# Patient Record
Sex: Male | Born: 1935 | Race: White | Hispanic: No | Marital: Married | Smoking: Former smoker
Health system: Southern US, Community
[De-identification: ages and names within clinical notes are randomized; demographics above are authoritative.]

## PROBLEM LIST (undated history)

## (undated) DIAGNOSIS — Z923 Personal history of irradiation: Secondary | ICD-10-CM

## (undated) DIAGNOSIS — R918 Other nonspecific abnormal finding of lung field: Secondary | ICD-10-CM

## (undated) DIAGNOSIS — Q5310 Unspecified undescended testicle, unilateral: Secondary | ICD-10-CM

## (undated) DIAGNOSIS — R001 Bradycardia, unspecified: Secondary | ICD-10-CM

## (undated) DIAGNOSIS — E7800 Pure hypercholesterolemia, unspecified: Secondary | ICD-10-CM

## (undated) DIAGNOSIS — M1990 Unspecified osteoarthritis, unspecified site: Secondary | ICD-10-CM

## (undated) DIAGNOSIS — H269 Unspecified cataract: Secondary | ICD-10-CM

## (undated) DIAGNOSIS — N183 Chronic kidney disease, stage 3 unspecified: Secondary | ICD-10-CM

## (undated) DIAGNOSIS — I4819 Other persistent atrial fibrillation: Secondary | ICD-10-CM

## (undated) DIAGNOSIS — Z8619 Personal history of other infectious and parasitic diseases: Secondary | ICD-10-CM

## (undated) DIAGNOSIS — K5790 Diverticulosis of intestine, part unspecified, without perforation or abscess without bleeding: Secondary | ICD-10-CM

## (undated) DIAGNOSIS — Z86010 Personal history of colonic polyps: Secondary | ICD-10-CM

## (undated) DIAGNOSIS — I2510 Atherosclerotic heart disease of native coronary artery without angina pectoris: Secondary | ICD-10-CM

## (undated) DIAGNOSIS — H409 Unspecified glaucoma: Secondary | ICD-10-CM

## (undated) DIAGNOSIS — M86131 Other acute osteomyelitis, right radius and ulna: Secondary | ICD-10-CM

## (undated) DIAGNOSIS — N400 Enlarged prostate without lower urinary tract symptoms: Secondary | ICD-10-CM

## (undated) DIAGNOSIS — I6529 Occlusion and stenosis of unspecified carotid artery: Secondary | ICD-10-CM

## (undated) DIAGNOSIS — C61 Malignant neoplasm of prostate: Secondary | ICD-10-CM

## (undated) DIAGNOSIS — K219 Gastro-esophageal reflux disease without esophagitis: Secondary | ICD-10-CM

## (undated) DIAGNOSIS — K4090 Unilateral inguinal hernia, without obstruction or gangrene, not specified as recurrent: Secondary | ICD-10-CM

## (undated) DIAGNOSIS — K449 Diaphragmatic hernia without obstruction or gangrene: Secondary | ICD-10-CM

## (undated) DIAGNOSIS — E785 Hyperlipidemia, unspecified: Secondary | ICD-10-CM

## (undated) DIAGNOSIS — I10 Essential (primary) hypertension: Secondary | ICD-10-CM

## (undated) HISTORY — DX: Benign prostatic hyperplasia without lower urinary tract symptoms: N40.0

## (undated) HISTORY — DX: Chronic kidney disease, stage 3 (moderate): N18.3

## (undated) HISTORY — DX: Gastro-esophageal reflux disease without esophagitis: K21.9

## (undated) HISTORY — DX: Personal history of other infectious and parasitic diseases: Z86.19

## (undated) HISTORY — DX: Essential (primary) hypertension: I10

## (undated) HISTORY — DX: Diverticulosis of intestine, part unspecified, without perforation or abscess without bleeding: K57.90

## (undated) HISTORY — DX: Chronic kidney disease, stage 3 unspecified: N18.30

## (undated) HISTORY — DX: Unspecified osteoarthritis, unspecified site: M19.90

## (undated) HISTORY — DX: Occlusion and stenosis of unspecified carotid artery: I65.29

## (undated) HISTORY — DX: Other persistent atrial fibrillation: I48.19

## (undated) HISTORY — DX: Unspecified undescended testicle, unilateral: Q53.10

## (undated) HISTORY — DX: Hyperlipidemia, unspecified: E78.5

## (undated) HISTORY — DX: Pure hypercholesterolemia, unspecified: E78.00

## (undated) HISTORY — DX: Unilateral inguinal hernia, without obstruction or gangrene, not specified as recurrent: K40.90

## (undated) HISTORY — DX: Atherosclerotic heart disease of native coronary artery without angina pectoris: I25.10

## (undated) HISTORY — DX: Diaphragmatic hernia without obstruction or gangrene: K44.9

## (undated) HISTORY — PX: TUMOR REMOVAL: SHX12

## (undated) HISTORY — DX: Bradycardia, unspecified: R00.1

## (undated) HISTORY — DX: Other nonspecific abnormal finding of lung field: R91.8

## (undated) HISTORY — DX: Personal history of irradiation: Z92.3

## (undated) HISTORY — DX: Malignant neoplasm of prostate: C61

## (undated) HISTORY — DX: Other acute osteomyelitis, right radius and ulna: M86.131

## (undated) HISTORY — DX: Personal history of colonic polyps: Z86.010

---

## 1989-08-24 HISTORY — PX: CORONARY ARTERY BYPASS GRAFT: SHX141

## 1998-07-06 ENCOUNTER — Emergency Department (HOSPITAL_COMMUNITY): Admission: EM | Admit: 1998-07-06 | Discharge: 1998-07-06 | Payer: Self-pay | Admitting: Emergency Medicine

## 2003-02-27 ENCOUNTER — Encounter: Admission: RE | Admit: 2003-02-27 | Discharge: 2003-02-27 | Payer: Self-pay | Admitting: Internal Medicine

## 2003-02-27 ENCOUNTER — Encounter: Payer: Self-pay | Admitting: Internal Medicine

## 2003-04-13 ENCOUNTER — Inpatient Hospital Stay (HOSPITAL_COMMUNITY): Admission: AD | Admit: 2003-04-13 | Discharge: 2003-04-20 | Payer: Self-pay | Admitting: Interventional Cardiology

## 2003-04-20 ENCOUNTER — Encounter: Payer: Self-pay | Admitting: Interventional Cardiology

## 2003-04-25 DIAGNOSIS — Z95810 Presence of automatic (implantable) cardiac defibrillator: Secondary | ICD-10-CM

## 2003-04-25 HISTORY — DX: Presence of automatic (implantable) cardiac defibrillator: Z95.810

## 2003-05-14 ENCOUNTER — Ambulatory Visit (HOSPITAL_COMMUNITY): Admission: RE | Admit: 2003-05-14 | Discharge: 2003-05-15 | Payer: Self-pay | Admitting: Cardiology

## 2003-05-14 HISTORY — PX: PACEMAKER INSERTION: SHX728

## 2003-05-15 ENCOUNTER — Encounter: Payer: Self-pay | Admitting: Cardiology

## 2004-01-18 ENCOUNTER — Encounter: Admission: RE | Admit: 2004-01-18 | Discharge: 2004-01-18 | Payer: Self-pay | Admitting: Family Medicine

## 2005-06-11 ENCOUNTER — Emergency Department (HOSPITAL_COMMUNITY): Admission: EM | Admit: 2005-06-11 | Discharge: 2005-06-12 | Payer: Self-pay | Admitting: Emergency Medicine

## 2007-02-17 ENCOUNTER — Encounter: Admission: RE | Admit: 2007-02-17 | Discharge: 2007-02-17 | Payer: Self-pay | Admitting: Orthopedic Surgery

## 2007-12-21 HISTORY — PX: COLONOSCOPY: SHX174

## 2009-06-04 ENCOUNTER — Ambulatory Visit (HOSPITAL_COMMUNITY): Admission: RE | Admit: 2009-06-04 | Discharge: 2009-06-04 | Payer: Self-pay | Admitting: Interventional Cardiology

## 2009-06-04 HISTORY — PX: CARDIAC CATHETERIZATION: SHX172

## 2009-08-24 HISTORY — PX: CATARACT EXTRACTION W/ INTRAOCULAR LENS IMPLANT: SHX1309

## 2010-04-11 ENCOUNTER — Ambulatory Visit (HOSPITAL_COMMUNITY): Admission: RE | Admit: 2010-04-11 | Discharge: 2010-04-11 | Payer: Self-pay | Admitting: Interventional Cardiology

## 2010-09-14 ENCOUNTER — Encounter: Payer: Self-pay | Admitting: Orthopedic Surgery

## 2010-11-27 LAB — PROTIME-INR
INR: 1.5 — ABNORMAL HIGH (ref 0.00–1.49)
Prothrombin Time: 18 seconds — ABNORMAL HIGH (ref 11.6–15.2)

## 2011-01-09 NOTE — H&P (Signed)
NAME:  Jonathan, Warner                  ACCOUNT NO.:  [id number]   MEDICAL RECORD NO.:  [id number]          PATIENT TYPE:  OBV   LOCATION:  1831                         FACILITY:  [facility]   PHYSICIAN:  [place] OF BIRTH:  1931/09/28   DATE OF ADMISSION:  06/11/2005  DATE OF DISCHARGE:                                HISTORY & PHYSICAL   The patient is a 77 year old man with a history of MI and CABG who had three  episodes of dizziness with nausea. He also had chest tightness and  diaphoresis associated with these episodes. They all lasted just a few  minutes and were relieved spontaneously. One episode occurred while sitting,  two others while standing. Currently, the patient does not have any chest  discomfort. He does not have any shortness of breath. Since his bypass in  1991, he has not required any revascularization. He has undergone a cardiac  catheterization in 2001 after which medical management was continued. He  also had a normal stress test approximately one week ago. Overall, he feels  well.   PAST MEDICAL/SURGICAL HISTORY:  1.  CABG in 1991.  2.  Coronary artery disease, status post pacemaker.  3.  Atrial fibrillation.  4.  Arthritis.   CURRENT MEDICATIONS:  1.  Coumadin 2.5 mg p.o. daily.  2.  Terazosin 5 mg p.o. daily.  3.  Norvasc 10 mg p.o. daily.  4.  Lisinopril 10 mg p.o. daily.  5.  KCl 20 mEq p.o. daily.  6.  Sotalol 80 mg b.i.d.  7.  Crestor 5 mg b.i.d.   ALLERGIES:  No known drug allergies.   SOCIAL HISTORY:  No tobacco, no alcohol, no herbal medicines.   FAMILY HISTORY:  There is a history of coronary artery disease.   REVIEW OF SYSTEMS:  She does report joint pains, nausea, chest discomfort,  dizziness, and diaphoresis as described above. All other systems negative.   PHYSICAL EXAMINATION:  VITAL SIGNS:  Pulse of 62, blood pressure of 155/77.  The patient is afebrile.  GENERAL:  He is awake, alert, and in no apparent distress.  NECK:  No bruits and no jugular venous distention.  CARDIOVASCULAR:  Regular rate and rhythm. S1 and S2.  LUNGS:  Clear to auscultation bilaterally.  ABDOMEN:  Soft, nontender, and nondistended.  EXTREMITIES:  There was 1+ pretibial edema.  NEUROLOGICAL:  No focal deficits.   LABORATORY DATA:  EKG shows normal sinus rhythm, occasional PVCs,  nonspecific ST and T-wave changes.   Hemoglobin 13.6, hematocrit 39.6. BUN 19, creatinine 1.1. Potassium of 4.3.  First set of cardiac enzymes MB of 1.4, troponin less than 0.05.   ASSESSMENT:  This 77 year old with coronary artery disease and chest pain.   PLAN:  1.  Cardiac:  Rule out MI with enzymes. The patient also on Coumadin with an      INR of 3.3 as of yesterday. We will not use any heparin at this time.      Continue aspirin 81 mg p.o. daily.  2.  It is unclear how best to evaluate this patient next  since he has had      such a recent stress test; however, it will also be difficult to perform      cardiac catheterization on him given his INR. Further plans per Dr.      [name].           ______________________________  Jettie Booze, MD     JSV/MEDQ  D:  06/12/2005  T:  06/12/2005  Job:  [id number]

## 2011-01-09 NOTE — Discharge Summary (Signed)
NAME:  Jonathan Warner, Jonathan Warner                            ACCOUNT NO.:  [id number]   MEDICAL RECORD NO.:  [id number]                   PATIENT TYPE:  INP   LOCATION:  3702                                 FACILITY:  [facility]   PHYSICIAN:  Belva Crome, M.D.                DATE OF BIRTH:  Feb 20, 1932   DATE OF ADMISSION:  04/13/2003  DATE OF DISCHARGE:  04/20/2003                                 DISCHARGE SUMMARY   ADMISSION DIAGNOSES:  1. Atrial fibrillation.  2. Elective admission for drug loading with Betapace.  3. Coumadin anticoagulation.  4. Coronary artery disease, status post coronary artery bypass graft 2000.  5. Hypokalemia.  6. Hypertension.   DISCHARGE DIAGNOSES:  1. Atrial fibrillation, resolved with cardioversion.  2. Successful drug loading with Betapace.  3. Chronic anticoagulation secondary to #1.  4. Coronary artery disease, status post coronary artery bypass graft in     2000, stable.  5. Hypokalemia, resolved with replacement.  6. Hypertension, controlled.   PROCEDURE:  Elective cardioversion April 19, 2003.   COMPLICATIONS:  None.   DISCHARGE STATUS:  Stable.   HISTORY OF PRESENT ILLNESS:  For completeness, see H&P.  However, in short,  this a 77 year old male who was admitted on April 13, 2003, electively for  drug loading with Betapace secondary to atrial fibrillation.  His duration  of atrial fibrillation has been unknown; however, was seen at least a month  ago by primary care and found to have new-onset atrial fibrillation.  A 2D  echocardiogram March 12, 2003, showed normal LV function and moderate-to-  severe left atrial enlargement.  He was having tachyarrhythmias despite  being on rate-controlling medications, including metoprolol and Cardizem.  He had complaints of exercise intolerance and, hence, was admitted for  medication adjustment.   PHYSICAL EXAMINATION:  On admission, see admission H&P.   HOSPITAL COURSE:  The patient discontinued his Cardizem  and metoprolol 24  hours prior to his admission on April 13, 2003.  He was started on Betapace  120 mg b.i.d.  He continued his Coumadin therapy.  He required a significant  amount of potassium replacement secondary to hypokalemia.  His potassium at  admission was 2.5.  Thus, Betapace initiation was placed on hold secondary  to normalization of potassium.  He was without any complaints.  Vital signs  remained stable with the exception of some mild hypertension.   On April 14, 2003, he was still moderately hypokalemic at 3.0.  Replacement  was continued.  His Norvasc was increased from 7.5 to 10 mg secondary to  hypertension.  On April 15, 2003, he was still hypokalemic with his  potassium still at 3.2.  Oral replacement continued.  Betapace therapy still  not begun.  His potassium finally normalized by April 16, 2003, at 3.8.  Hence, he was started on sotalol at 120 mg b.i.d.  Serum and urine  aldosterone  levels as well as serum renin levels and magnesium were all  evaluated at this point secondary to continued prolonged hypokalemia.   By April 17, 2003, blood pressure had improved on higher dose of Norvasc.  ACE inhibitor was added for additional control as well as to help with  potassium requirements.  So far, he is tolerating the Betapace without  significant bradycardia.  QTC was within normal limits.   On April 19, 2003, the patient underwent electrical cardioversion  successfully after one 300 joule delivery which converted him to a sinus  bradycardia.  Tolerated procedure well.  Vital signs remained stable.  Renal  consultation was also requested on this day secondary to increased renin and  aldosterone levels.  Magnesium level was normal.  Renal consultation was  obtained April 19, 2003.  Their recommendations included a MRA to evaluate  for renal artery stenosis.  Urine osmolality was also ordered at their  request.   On April 20, 2003, the patient had no complaints.   Vital signs were stable  with normal blood pressure.  Potassium was 4.2.  He maintained a sinus  bradycardia in the 50s without dizziness or near syncope.  He underwent MRA  of the renal arteries.  That report is not present at the time of this  dictation.  He was discharged home without further incident.   DISCHARGE MEDICATIONS:  1. Coumadin 2.5 mg daily.  2. Prilosec 20 mg daily.  3. Hytrin 5 mg daily.  4. Norvasc 10 mg daily.  5. Lisinopril 10 mg daily.  6. Potassium 20 mEq daily.  7. Betapace 80 mg b.i.d.  8. He was instructed not to take his hydrochlorothiazide, metoprolol or     Cardizem.   ACTIVITY:  As tolerated.   PLAN:  1. He is to maintain a low-salt diet.  No caffeine.  2. He is to return to the office on Friday, April 27, 2003, for INR and     potassium check.  3. He has an appointment to follow up with Dr. [name] on Friday, May 04, 2003, at 9:15 a.m.      Verlon Au, N.P.                        Belva Crome, M.D.    HB/MEDQ  D:  04/20/2003  T:  04/21/2003  Job:  [id number]   cc:   Harriette Bouillon, M.D.  [place] [place]  [place] [address]  Fax: [phone]

## 2011-01-09 NOTE — H&P (Signed)
NAME:  Jonathan Warner, Jonathan Warner                            ACCOUNT NO.:  [id number]   MEDICAL RECORD NO.:  [id number]                   PATIENT TYPE:  INP   LOCATION:  3702                                 FACILITY:  [facility]   PHYSICIAN:  Belva Crome, M.D.                DATE OF BIRTH:  Jun 23, 1932   DATE OF ADMISSION:  04/13/2003  DATE OF DISCHARGE:                                HISTORY & PHYSICAL   ADMISSION DIAGNOSES:  1. Atrial fibrillation.  2. Elective admission for drug loading with Betapace.  3. Coumadin anticoagulation.  4. Known coronary artery disease status post coronary artery bypass grafting     in 2000.  5. Hypokalemia.  6. Hypertension.  7. Benign prostatic hypertrophy.  8. Gastroesophageal reflux disease.   CHIEF COMPLAINT:  Atrial fibrillation of unknown duration, symptomatic.   HISTORY OF PRESENT ILLNESS:  Jonathan Warner is a pleasant 77 year old white  gentleman who was initially evaluated by Dr. [name] as a referral from Dr.  Leonie Douglas on 03/09/2003.  This referral was for further workup and evaluation  of new onset atrial fibrillation, duration unknown.  The patient had been  started on Coumadin by Dr. Leonie Douglas in July upon initial diagnosis.  An  echocardiogram of 03/12/2003, showed moderate to severe left atrial  enlargement, preserved LV systolic function, and dilated left ventricular  cavity at 6.0 cm and diastole with EF of 65% and asymmetrical septal  hypertrophy.  He also wore a Holter monitor which revealed tachyarrhythmias  with rates up to 160 despite being on metoprolol and Cardizem.   The patient had been complaining of decreased energy and exertional  tolerance.  He denies syncope, presyncope, or bleeding trouble on Coumadin.  He denies orthopnea, PND and chest pain.   Dr. [name] has recommended elective admission for Betapace loading.  The  patient is to discontinue Cardizem and Toprol 24 hours prior to admission.  Will start Betapace 120 mg b.i.d. and  cardiovert the patient after 48 hours  of therapy if he does not convert.   ALLERGIES:  HYDROCODONE caused hives and nausea.   MEDICATIONS:  1. Norvasc 7.5 mg a day.  2. Metoprolol 50 mg a day.  3. Cardizem 240 mg a day.  4. Hydrochlorothiazide 25 mg.  5. Terazosin 5 mg a day.  6. Coumadin as directed.  7. Multivitamins.  8. Vitamin E.  9. Prilosec.    PAST MEDICAL HISTORY:  1. Coronary artery disease status post CABG in 2000 with LIMA to LAD, SVG to     OM-1, and SVT to the intermediate and then a sequential SVG to acute     marginal and distal RCA.  2. Hypertension.  3. BPH.  4. History of kidney stones.  5. GERD.   FAMILY HISTORY:  Mother died late in life with cardiovascular disease (in  her 56s).  Two brothers died of myocardial infarction, and sisters  have  hypertension.   SOCIAL HISTORY:  The patient is married.  He is retired.  He has one son.  He does not smoke, use tobacco products, alcohol, or illicit drugs.   REVIEW OF SYSTEMS:  See HPI, otherwise unremarkable.   PHYSICAL EXAMINATION:  GENERAL:  Alert and oriented x 3 in no acute  distress.  Telemetry reveals atrial fibrillation with controlled ventricular  response.  HEENT:  Normocephalic and atraumatic.  PERRLA.  EOMI.  NECK:  Supple without bruits or masses.  No JVD.  LUNGS:  Clear to auscultation with symmetric excursion.  COR:  Irregular rate and rhythm with a 1/6 systolic murmur.  No gallops or  rubs.  ABDOMEN:  Soft, nontender, nondistended.  Positive bowel sounds.  No bruits.  EXTREMITIES:  Without edema.  Femoral and distal pulses are intact.  NEUROLOGIC:  Nonfocal.  Mentation intact.   IMPRESSION:  1. Symptomatic atrial fibrillation of unknown duration.  2. Coumadin anticoagulation.  3. Hypertension.  4. Coronary artery disease.  5. Gastroesophageal reflux disease.  6. Benign prostatic hypertrophy.   PLAN:  As noted above, the patient will be admitted to [facility]  for elective  drug loading with Betapace. Will discontinue Cardizem and  metoprolol 24 hours prior to admission.  Will start Betapace 120 mg b.i.d.  and then cardiovert after 48 hours of therapy if patient does not convert  pharmacologically.   The patient is agreeable to this approach.  He does have a low potassium on  pre-admission laboratory studies, and this will be rechecked prior to  starting the Betapace therapy.      Dellia Nims Jernejcic, P.A.                   Belva Crome, M.D.    TCJ/MEDQ  D:  04/13/2003  T:  04/14/2003  Job:  [id number]   cc:   Harriette Bouillon, M.D.  [place]. [address] [place]  [place] [address]  Fax: [phone]

## 2011-01-09 NOTE — Consult Note (Signed)
NAME:  Jonathan Warner, Jonathan Warner                            ACCOUNT NO.:  [id number]   MEDICAL RECORD NO.:  [id number]                   PATIENT TYPE:  INP   LOCATION:  3702                                 FACILITY:  [facility]   PHYSICIAN:  James L. Deterding, M.D.            DATE OF BIRTH:  1932/03/24   DATE OF CONSULTATION:  04/19/2003  DATE OF DISCHARGE:                                   CONSULTATION   REFERRING PHYSICIAN:  Belva Crome, M.D.   CONSULTING PHYSICIAN:  Joyice Faster. Deterding, M.D.   REASON FOR CONSULTATION:  Hypokalemia and hypertension.   HISTORY OF PRESENT ILLNESS:  This is a 77 year old gentleman with a history  of hypertension for about 20 years, history of MI x2, first one in 1984,  next one in 1991, history of CABG in 1991, history of kidney stones in the  past three to four months, history of shingles in the last few months,  history of GERD, and history of BPH.  He was found to have atrial  fibrillation in July 2004, of unknown cause.  He is admitted now for  Betapace load and had a potassium on admission of 2.7.  He has received  about 200 mEq of potassium to replete his potassium, but his chloride has  decreased with potassium.   HOME MEDICATIONS:  1. Norvasc.  2. Metoprolol.  3. Cardizem.  4. Hydrochlorothiazide.  5. Hytrin.  6. Coumadin.  7. Vitamin E.  8. Prilosec.   He also had shingles in June.   FAMILY HISTORY:  He has a positive family history with his mother and six  siblings who have hypertension.  He has one sister he knows has had a stent  to her kidney for high blood pressure, and also he has multiple siblings on  oral potassium.   He has had no history of frequent loose stools.  He has a history of dyspnea  on exertion, but no orthopnea or edema.  He has no muscle aches, pain, no  syncope or paralysis, and no family history of that.  He had several  siblings on the potassium as mentioned above.  He has no history of  diabetes, no family history  of diabetes.  He has no history of urinary tract  infections.  He has a history of non-steroidal use in the past three or four  months.  He has a  history of osteomyelitis a few years ago in his right  arm.  This was related to an injury that occurred as a child.  His blood  pressure has been increased in the last four months, and that correlates  with fatigue, as well as correlates with use of hydrochlorothiazide.  Hydrochlorothiazide was added, however, because his blood pressure was  elevated.   PAST MEDICAL HISTORY:  1. The above medications.  2. He has had a CABG.  3. He had a fracture of  his arm and operative procedure, and subsequent     osteomyelitis in the arm.   ALLERGIES:  HYDROCODONE, which caused rash and hives.   ILLNESSES:  Listed above.   SOCIAL HISTORY:  He is retired, married, he has one son who is healthy.   FAMILY HISTORY:  He has nine siblings, six at least have high blood  pressure.  He thinks the others do too.  Two brothers died of myocardial  infarction, and both his sisters have high blood pressure.  Mother died of  cardiovascular disease in her 25s.  Father died with what he thinks is  cardiovascular disease in his 42s.   REVIEW OF SYSTEMS:  HEENT:  No visual problems, no headaches, hearing  difficulties.  CARDIOVASCULAR:  As listed above.  PULMONARY:  No cough,  sputum production, he has no history of asthma, and has a smoking history of  over 20 pack year history, quit in 1984 with his first MI.  GASTROINTESTINAL:  He has indigestion and heartburn quite frequently, and  takes Prilosec.  He has loose stools, anywhere from one to three a day.  MUSCULOSKELETAL:  Only arthritis in the arm where he had the injury.  He did  take the non-steroidal's for that.  NEUROLOGIC:  No complaints.  EXTREMITIES:  No complaints other then his right arm where he had the injury  and the osteo.   PHYSICAL EXAMINATION:  GENERAL:  In no acute distress.  He is oriented x3.   VITAL SIGNS:  Blood pressure is 122/52 going to 124/54 from laying to  standing, and no difference in the left arm.  The first two pressures were  on the right.  HEENT:  Retina, disks, and vessels to be normal.  Ears unremarkable.  Pharynx unremarkable.  CARDIOVASCULAR:  Regular rhythm, S4, grade 2/6 holosystolic murmur heard  best at the apex.  PMI is 12 cm  from the fifth intercostal space.  He had  bilateral femoral bruits.  He has a right carotid bruit.  He has decreased  breath sounds, pedal pulses, and 1 to 2+ edema.  LUNGS:  No rales, rhonchi, or wheezes.  ABDOMEN:  Positive bowel sounds.  Liver is at costal margin.  No  organomegaly.  The abdomen is soft.  He cannot hear bruits about the  abdomen.  SKIN:  Shows some dryness.  He has some seborrheic keratosis and a rash, a  large scar on his right forearm where he had a slight deformity of the right  forearm where he had the osteo and injury there.  MUSCULOSKELETAL:  Otherwise shows some hypertrophic changes of DIP's and  PIP's, otherwise, unremarkable.  NEUROLOGIC:  Cranial nerves II-XII grossly intact.  Motor is 5/5 and  symmetric.  Deep tendon reflexes are 2+/4+.  Toes downgoing.   LABORATORY DATA:  Reveals hemoglobin to be 11.7, white count 5400, platelets  of 140,000.  Sodium 135, potassium 4.2, chloride 102, bicarbonate 26,  creatinine 1.3, BUN 28, glucose 76.  Aldo of 32.7 ng/dl.  __________  activity 8.7 nanograms/ml/hour.  Urine potassium 50 mEq/L.  Urine sodium 56  mEq/L.  Creatinine clearance 97 cc a minute.   ASSESSMENT:  1. Hypokalemia in the setting of hypertension and vascular disease.  Replete     without a large amount of difficulty at this time.  Decreased potassium     in the setting of hydrochlorothiazide and diarrhea is somewhat     suspicious, however.  The increased renin which has increased even  greater then increasing aldo is somewhat borderline and difficult to    interpret in the setting of this  situation, i.e., cardiac disease and     hydrochlorothiazide.  He was not on a hydrochlorothiazide and potassium     repletion, would expect a lower potassium in urine, but cannot say that     with those two factors compounding that.  Even with these confusing     factors, I feel that his hyperaldosteronism and hyperreninism is at a     high risk for renal artery stenosis.  This situation with increased renin     and aldo and alkalosis is less likely from Bartter/Gitelman syndrome.   Would look as his renal size, look at the architecture of the kidneys on an  ultrasound, and then check an MRA to look at his renal arteries.   Repeat urine potassium would also be useful, as well as urine sediment.  A  history of proteinuria is worrisome.  1. Hypertension.  Decrease question if the lisinopril is having an increased     effect because of the possibility of a high renin Jonathan Warner.  2. Atrial fibrillation.  Per Dr. [name].  3. Peripheral vascular disease, on exam.  4. Reflux esophagitis.   PLAN:  1. MRA.  2. Ultrasound.  3. Urinalysis.  4. Urine potassium.  5. Urine osmolality and serum osmolality.                                               James L. Deterding, M.D.    JLD/MEDQ  D:  04/19/2003  T:  04/20/2003  Job:  [id number]

## 2011-01-09 NOTE — Op Note (Signed)
NAME:  Jonathan Warner, Jonathan Warner                            ACCOUNT NO.:  [id number]   MEDICAL RECORD NO.:  [id number]                   PATIENT TYPE:  OIB   LOCATION:  6522                                 FACILITY:  [facility]   PHYSICIAN:  Barnett Abu, M.D.               DATE OF BIRTH:  01/21/1932   DATE OF PROCEDURE:  05/14/2003  DATE OF DISCHARGE:                                 OPERATIVE REPORT   PROCEDURES PERFORMED:  1. Insertion of dual-chamber permanent transvenous pacemaker.  2. Left subclavian venogram.   INDICATIONS:  Mr. Jonathan Warner is a 77 year old male with paroxysmal atrial  fibrillation.  This has been well-controlled with sotalol 80 mg p.o. b.i.d.  However, he has had a problem with excessive fatigue and chronic  bradycardia, heart rate in the 40-45 beat per minute range.  He is referred  now for insertion of a dual-chamber permanent transvenous pacemaker with  activity sensory.   PROCEDURAL NOTE:  The patient was brought to the cardiac catheterization  laboratory in the fasting state.  The left prepectoral region was prepped  and draped in the usual sterile fashion.  Local anesthesia was attained with  the infiltration of 1% lidocaine.  A left subclavian venogram was performed  with the peripheral injection of 20 mL of Omnipaque.  A digital  cineangiogram was obtained and road-mapped to guide future left subclavian  puncture.  The venogram did show the vein to be widely patent and coursing  in a normal fashion over the anterior surface of the first rib and beneath  the middle third of the clavicle.  There was no evidence for persistence of  the left superior vena cava.  A 6-7 cm incision was then made in the  deltopectoral groove, and this was carried down by sharp dissection and  electrocautery to the prepectoral fascia.  There a plane was lifted and a  pocket formed inferiorly and medially using blunt dissection and  electrocautery.  The pocket was then packed with a 1%  kanamycin-soaked  gauze.  Two separate left subclavian punctures were then performed using an  18-gauge thin-wall needle, through which was passed a 0.038 inch tight J  guidewire.  Over the initial guidewire a 7 French tear-away sheath and  dilator were advanced.  The wire and dilator were removed.  The ventricular  lead was advanced to the level of the right atrium and the sheath was torn  away.  Using standard technique and fluoroscopic landmarks, the lead was  manipulated into the right ventricular apex.  There excellent pacing  parameters were obtained, as will be noted below.  The lead was tested for  diaphragmatic pacing at 10 volts, and none was found.  The lead was then  sutured into place using three separate 0 silk ligatures.  Over the  remaining guidewire a 9 French tear-away sheath and dilator were advanced.  The dilator  was removed.  The wire was allowed to remain in place.  The  atrial lead was advanced to the level of the right atrium.  The sheath was  torn away.  Again using standard technique and fluoroscopic landmarks, the  lead was manipulated into the right atrial appendage remnant.  There  excellent pacing parameters were obtained.  This was an active-fixation lead  and the screw was advanced as appropriate.  The lead was tested again for  adequate pacing parameters and these were excellent, as noted below.  The  lead was also tested for diaphragmatic pacing at 10 volts, and none was  found.  The lead was then sutured in place using three separate 0 silk  ligatures.  The remaining guidewire and the kanamycin-soaked gauze was  removed from the pocket.  The pocket was copiously irrigated using 1%  kanamycin solution.  The pacing leads were then connected to the pacing  generator, carefully identifying each by its serial number and placing each  into the appropriate receptacle.  Each lead was carefully tightened into  place and tested for security.  The leads were then  wound beneath the pacing  generator and the generator was placed in the pocket.  The pocket was  inspected for bleeding and none was found.  The wound was then closed using  2-0 Dexon in a running fashion for the subcutaneous layer.  The skin was  approximated using 5-0 Dexon in a running subcuticular fashion.  Steri-  Strips and a sterile dressing were applied and the patient was transported  to the recovery area in an A-pace, V-sense mode.   EQUIPMENT DATA:  The pacing generator is a Medtronic Enpulse model number  [id number], serial number [id number].  The atrial lead is a Medtronic model  number [id number], serial number [id number].  The ventricular lead is a Medtronic  model [id number], serial number [id number].   PACING DATA:  The atrial lead detected a 5.8 millivolt P-wave.  The pacing  threshold was 1 volt at 0.5 msec pulse width.  The impedance was 477 Ohms.  This resulted in a current at capture threshold of 2.7 MA.  The ventricular  lead detected an 11.3 millivolt R-wave.  The pacing threshold was 0.4 volts  at 0.5 msec pulse width.  The impedance was 581 Ohms, resulting in a current  at capture threshold of 1 MA.                                               Barnett Abu, M.D.    JHE/MEDQ  D:  05/14/2003  T:  05/15/2003  Job:  [id number]   cc:   Sinclair Grooms, M.D.  [place] [employer]  [address]  [place] [address]  Fax: [phone]   Cardiac Catheterization Laboratory

## 2011-01-09 NOTE — Op Note (Signed)
   NAME:  Jonathan Warner, Jonathan Warner                            ACCOUNT NO.:  [id number]   MEDICAL RECORD NO.:  [id number]                   PATIENT TYPE:  INP   LOCATION:  3702                                 FACILITY:  [facility]   PHYSICIAN:  Sinclair Grooms, M.D.            DATE OF BIRTH:  1931-09-26   DATE OF PROCEDURE:  04/19/2003  DATE OF DISCHARGE:                                 OPERATIVE REPORT   PROCEDURE PERFORMED:  Elective cardioversion.   INDICATIONS FOR PROCEDURE:  Atrial fibrillation.   DESCRIPTION OF PROCEDURE:  The patient was given 100 mg of sodium pentothal  by Sharolyn Douglas, M.D.  The patient had been placed with anterior and  posterior paddle configuration.  In synchronized mode, a single 300 joule  discharge was administered with reversion to normal sinus rhythm/significant  sinus bradycardia.  The patient awakened from the procedure without  complication.   CONCLUSION:  Successful electrical cardioversion from atrial fibrillation to  sinus bradycardia.   PLAN:  Watch heart rate closely.  May ultimately require pacemaker with  antiarrhythmic therapies to be continued.  Will use on Betapace and try to  remove all other medications that could potentially slow heart rate.                                               Sinclair Grooms, M.D.    HWS/MEDQ  D:  04/19/2003  T:  04/19/2003  Job:  [id number]   cc:   Harriette Bouillon, M.D.  [address]  [place] [address]  Fax: [phone]

## 2011-03-12 ENCOUNTER — Other Ambulatory Visit: Payer: Self-pay | Admitting: Internal Medicine

## 2011-03-12 DIAGNOSIS — M545 Low back pain, unspecified: Secondary | ICD-10-CM

## 2011-03-13 ENCOUNTER — Ambulatory Visit
Admission: RE | Admit: 2011-03-13 | Discharge: 2011-03-13 | Disposition: A | Discharge status: Home / Self Care | Payer: Medicare Other | Source: Ambulatory Visit | Attending: Internal Medicine | Admitting: Internal Medicine

## 2011-03-13 DIAGNOSIS — M545 Low back pain: Secondary | ICD-10-CM

## 2011-05-06 ENCOUNTER — Institutional Professional Consult (permissible substitution): Payer: Medicare Other | Admitting: Internal Medicine

## 2011-08-26 DIAGNOSIS — I4891 Unspecified atrial fibrillation: Secondary | ICD-10-CM | POA: Diagnosis not present

## 2011-08-26 DIAGNOSIS — Z7901 Long term (current) use of anticoagulants: Secondary | ICD-10-CM | POA: Diagnosis not present

## 2011-08-26 DIAGNOSIS — Z950 Presence of cardiac pacemaker: Secondary | ICD-10-CM | POA: Diagnosis not present

## 2011-09-30 DIAGNOSIS — I4891 Unspecified atrial fibrillation: Secondary | ICD-10-CM | POA: Diagnosis not present

## 2011-09-30 DIAGNOSIS — Z7901 Long term (current) use of anticoagulants: Secondary | ICD-10-CM | POA: Diagnosis not present

## 2011-10-13 DIAGNOSIS — H40009 Preglaucoma, unspecified, unspecified eye: Secondary | ICD-10-CM | POA: Diagnosis not present

## 2011-10-21 DIAGNOSIS — Z950 Presence of cardiac pacemaker: Secondary | ICD-10-CM | POA: Diagnosis not present

## 2011-10-21 DIAGNOSIS — Z7901 Long term (current) use of anticoagulants: Secondary | ICD-10-CM | POA: Diagnosis not present

## 2011-10-21 DIAGNOSIS — I10 Essential (primary) hypertension: Secondary | ICD-10-CM | POA: Diagnosis not present

## 2011-10-21 DIAGNOSIS — I2510 Atherosclerotic heart disease of native coronary artery without angina pectoris: Secondary | ICD-10-CM | POA: Diagnosis not present

## 2011-10-21 DIAGNOSIS — I4891 Unspecified atrial fibrillation: Secondary | ICD-10-CM | POA: Diagnosis not present

## 2011-11-19 DIAGNOSIS — Z7901 Long term (current) use of anticoagulants: Secondary | ICD-10-CM | POA: Diagnosis not present

## 2011-11-19 DIAGNOSIS — I4891 Unspecified atrial fibrillation: Secondary | ICD-10-CM | POA: Diagnosis not present

## 2011-11-30 DIAGNOSIS — Z950 Presence of cardiac pacemaker: Secondary | ICD-10-CM | POA: Diagnosis not present

## 2011-11-30 DIAGNOSIS — I495 Sick sinus syndrome: Secondary | ICD-10-CM | POA: Diagnosis not present

## 2011-12-31 DIAGNOSIS — Z7901 Long term (current) use of anticoagulants: Secondary | ICD-10-CM | POA: Diagnosis not present

## 2011-12-31 DIAGNOSIS — I4891 Unspecified atrial fibrillation: Secondary | ICD-10-CM | POA: Diagnosis not present

## 2012-01-07 DIAGNOSIS — M79609 Pain in unspecified limb: Secondary | ICD-10-CM | POA: Diagnosis not present

## 2012-01-07 DIAGNOSIS — I252 Old myocardial infarction: Secondary | ICD-10-CM | POA: Diagnosis not present

## 2012-01-07 DIAGNOSIS — N183 Chronic kidney disease, stage 3 unspecified: Secondary | ICD-10-CM | POA: Diagnosis not present

## 2012-01-07 DIAGNOSIS — Z0000 Encounter for general adult medical examination without abnormal findings: Secondary | ICD-10-CM | POA: Diagnosis not present

## 2012-01-07 DIAGNOSIS — Z1331 Encounter for screening for depression: Secondary | ICD-10-CM | POA: Diagnosis not present

## 2012-02-04 DIAGNOSIS — R972 Elevated prostate specific antigen [PSA]: Secondary | ICD-10-CM | POA: Diagnosis not present

## 2012-02-11 DIAGNOSIS — K4090 Unilateral inguinal hernia, without obstruction or gangrene, not specified as recurrent: Secondary | ICD-10-CM | POA: Diagnosis not present

## 2012-02-11 DIAGNOSIS — R972 Elevated prostate specific antigen [PSA]: Secondary | ICD-10-CM | POA: Diagnosis not present

## 2012-02-11 DIAGNOSIS — Z7901 Long term (current) use of anticoagulants: Secondary | ICD-10-CM | POA: Diagnosis not present

## 2012-02-11 DIAGNOSIS — N402 Nodular prostate without lower urinary tract symptoms: Secondary | ICD-10-CM | POA: Diagnosis not present

## 2012-02-11 DIAGNOSIS — Q539 Undescended testicle, unspecified: Secondary | ICD-10-CM | POA: Diagnosis not present

## 2012-02-11 DIAGNOSIS — I4891 Unspecified atrial fibrillation: Secondary | ICD-10-CM | POA: Diagnosis not present

## 2012-03-14 DIAGNOSIS — Q150 Congenital glaucoma: Secondary | ICD-10-CM | POA: Diagnosis not present

## 2012-03-14 DIAGNOSIS — Z961 Presence of intraocular lens: Secondary | ICD-10-CM | POA: Diagnosis not present

## 2012-03-24 DIAGNOSIS — Z7901 Long term (current) use of anticoagulants: Secondary | ICD-10-CM | POA: Diagnosis not present

## 2012-03-24 DIAGNOSIS — Z950 Presence of cardiac pacemaker: Secondary | ICD-10-CM | POA: Diagnosis not present

## 2012-03-24 DIAGNOSIS — I4891 Unspecified atrial fibrillation: Secondary | ICD-10-CM | POA: Diagnosis not present

## 2012-04-27 DIAGNOSIS — R972 Elevated prostate specific antigen [PSA]: Secondary | ICD-10-CM | POA: Diagnosis not present

## 2012-04-27 DIAGNOSIS — C61 Malignant neoplasm of prostate: Secondary | ICD-10-CM

## 2012-04-27 HISTORY — DX: Malignant neoplasm of prostate: C61

## 2012-04-27 HISTORY — PX: PROSTATE BIOPSY: SHX241

## 2012-05-02 ENCOUNTER — Other Ambulatory Visit: Payer: Self-pay | Admitting: Urology

## 2012-05-02 DIAGNOSIS — C61 Malignant neoplasm of prostate: Secondary | ICD-10-CM

## 2012-05-11 DIAGNOSIS — C61 Malignant neoplasm of prostate: Secondary | ICD-10-CM | POA: Diagnosis not present

## 2012-05-12 ENCOUNTER — Encounter (HOSPITAL_COMMUNITY)
Admission: RE | Admit: 2012-05-12 | Discharge: 2012-05-12 | Disposition: A | Discharge status: Home / Self Care | Payer: Medicare Other | Source: Ambulatory Visit | Attending: Urology | Admitting: Urology

## 2012-05-12 ENCOUNTER — Ambulatory Visit (HOSPITAL_COMMUNITY)
Admission: RE | Admit: 2012-05-12 | Discharge: 2012-05-12 | Disposition: A | Discharge status: Home / Self Care | Payer: Medicare Other | Source: Ambulatory Visit | Attending: Urology | Admitting: Urology

## 2012-05-12 DIAGNOSIS — C61 Malignant neoplasm of prostate: Secondary | ICD-10-CM | POA: Diagnosis not present

## 2012-05-12 MED ORDER — TECHNETIUM TC 99M MEDRONATE IV KIT
25.0000 | PACK | Freq: Once | INTRAVENOUS | Status: AC | PRN
  Administered 2012-05-12: 25 via INTRAVENOUS

## 2012-05-13 ENCOUNTER — Encounter: Payer: Self-pay | Admitting: Internal Medicine

## 2012-05-13 DIAGNOSIS — I4891 Unspecified atrial fibrillation: Secondary | ICD-10-CM | POA: Diagnosis not present

## 2012-05-13 DIAGNOSIS — Z950 Presence of cardiac pacemaker: Secondary | ICD-10-CM | POA: Diagnosis not present

## 2012-05-13 DIAGNOSIS — I495 Sick sinus syndrome: Secondary | ICD-10-CM | POA: Diagnosis not present

## 2012-05-13 DIAGNOSIS — Z7901 Long term (current) use of anticoagulants: Secondary | ICD-10-CM | POA: Diagnosis not present

## 2012-05-18 DIAGNOSIS — C61 Malignant neoplasm of prostate: Secondary | ICD-10-CM | POA: Diagnosis not present

## 2012-05-19 ENCOUNTER — Encounter: Payer: Self-pay | Admitting: Internal Medicine

## 2012-05-19 DIAGNOSIS — R0602 Shortness of breath: Secondary | ICD-10-CM | POA: Diagnosis not present

## 2012-05-19 DIAGNOSIS — I2510 Atherosclerotic heart disease of native coronary artery without angina pectoris: Secondary | ICD-10-CM | POA: Diagnosis not present

## 2012-05-19 DIAGNOSIS — Z950 Presence of cardiac pacemaker: Secondary | ICD-10-CM | POA: Diagnosis not present

## 2012-05-19 DIAGNOSIS — I4891 Unspecified atrial fibrillation: Secondary | ICD-10-CM | POA: Diagnosis not present

## 2012-05-20 ENCOUNTER — Ambulatory Visit (INDEPENDENT_AMBULATORY_CARE_PROVIDER_SITE_OTHER): Payer: Medicare Other | Admitting: Internal Medicine

## 2012-05-20 ENCOUNTER — Other Ambulatory Visit: Payer: Self-pay | Admitting: *Deleted

## 2012-05-20 ENCOUNTER — Encounter: Payer: Self-pay | Admitting: *Deleted

## 2012-05-20 ENCOUNTER — Encounter: Payer: Self-pay | Admitting: Internal Medicine

## 2012-05-20 VITALS — BP 114/62 | HR 65 | Ht 70.0 in | Wt 186.0 lb

## 2012-05-20 DIAGNOSIS — I495 Sick sinus syndrome: Secondary | ICD-10-CM | POA: Diagnosis not present

## 2012-05-20 DIAGNOSIS — Z01812 Encounter for preprocedural laboratory examination: Secondary | ICD-10-CM | POA: Diagnosis not present

## 2012-05-20 DIAGNOSIS — I4891 Unspecified atrial fibrillation: Secondary | ICD-10-CM

## 2012-05-20 DIAGNOSIS — I25810 Atherosclerosis of coronary artery bypass graft(s) without angina pectoris: Secondary | ICD-10-CM

## 2012-05-20 DIAGNOSIS — R001 Bradycardia, unspecified: Secondary | ICD-10-CM

## 2012-05-20 DIAGNOSIS — I498 Other specified cardiac arrhythmias: Secondary | ICD-10-CM | POA: Diagnosis not present

## 2012-05-20 LAB — CBC WITH DIFFERENTIAL/PLATELET
Basophils Absolute: 0 10*3/uL (ref 0.0–0.1)
Basophils Relative: 0 % (ref 0–1)
Eosinophils Absolute: 0.1 10*3/uL (ref 0.0–0.7)
Eosinophils Relative: 2 % (ref 0–5)
HCT: 38.3 % — ABNORMAL LOW (ref 39.0–52.0)
Hemoglobin: 13.2 g/dL (ref 13.0–17.0)
Lymphocytes Relative: 26 % (ref 12–46)
Lymphs Abs: 1.3 10*3/uL (ref 0.7–4.0)
MCH: 33.8 pg (ref 26.0–34.0)
MCHC: 34.5 g/dL (ref 30.0–36.0)
MCV: 98.2 fL (ref 78.0–100.0)
Monocytes Absolute: 0.5 10*3/uL (ref 0.1–1.0)
Monocytes Relative: 10 % (ref 3–12)
Neutro Abs: 3 10*3/uL (ref 1.7–7.7)
Neutrophils Relative %: 62 % (ref 43–77)
Platelets: 160 10*3/uL (ref 150–400)
RBC: 3.9 MIL/uL — ABNORMAL LOW (ref 4.22–5.81)
RDW: 13 % (ref 11.5–15.5)
WBC: 4.8 10*3/uL (ref 4.0–10.5)

## 2012-05-21 LAB — BASIC METABOLIC PANEL
BUN: 20 mg/dL (ref 6–23)
CO2: 32 mEq/L (ref 19–32)
Calcium: 8.9 mg/dL (ref 8.4–10.5)
Chloride: 101 mEq/L (ref 96–112)
Creat: 1.3 mg/dL (ref 0.50–1.35)
Glucose, Bld: 76 mg/dL (ref 70–99)
Potassium: 3.6 mEq/L (ref 3.5–5.3)
Sodium: 140 mEq/L (ref 135–145)

## 2012-05-21 LAB — PROTIME-INR
INR: 2.51 — ABNORMAL HIGH (ref <1.50)
Prothrombin Time: 26.1 seconds — ABNORMAL HIGH (ref 11.6–15.2)

## 2012-05-23 ENCOUNTER — Encounter: Payer: Self-pay | Admitting: Internal Medicine

## 2012-05-23 ENCOUNTER — Encounter (HOSPITAL_COMMUNITY): Payer: Self-pay | Admitting: Pharmacy Technician

## 2012-05-23 DIAGNOSIS — R001 Bradycardia, unspecified: Secondary | ICD-10-CM | POA: Insufficient documentation

## 2012-05-23 DIAGNOSIS — I4891 Unspecified atrial fibrillation: Secondary | ICD-10-CM | POA: Insufficient documentation

## 2012-05-23 DIAGNOSIS — I25810 Atherosclerosis of coronary artery bypass graft(s) without angina pectoris: Secondary | ICD-10-CM | POA: Insufficient documentation

## 2012-05-23 MED ORDER — CEFAZOLIN SODIUM-DEXTROSE 2-3 GM-% IV SOLR
2.0000 g | INTRAVENOUS | Status: AC
  Filled 2012-05-23: qty 50

## 2012-05-23 MED ORDER — SODIUM CHLORIDE 0.9 % IR SOLN
80.0000 mg | Status: AC
  Filled 2012-05-23: qty 2

## 2012-05-23 NOTE — Assessment & Plan Note (Signed)
No ischemic symptoms No changes today 

## 2012-05-23 NOTE — Assessment & Plan Note (Signed)
The patient is s/p PPM for symptomatic bradycardia. He has reached ERI battery status and is symptomatic with this. Risks, benefits, and alternatives to PPM pulse generator replacement were discussed at length with the patient who wishes to proceed.  We will therefore plan pacemaker pulse generator change at the next available time.

## 2012-05-23 NOTE — Assessment & Plan Note (Signed)
Controlled with sotalol. Continue coumadin for stroke prevention No changes today

## 2012-05-23 NOTE — Progress Notes (Signed)
Primary Cardiologist:  Dr [name]  Jonathan Warner is a 78 y.o. male with a h/o bradycardia sp PPM (MDT) by Dr Leonia Reeves who presents today to establish care in the Electrophysiology device clinic.  The patient presented in 2004 with atrial fibrillation requiring cardioversion.  He was able to maintain sinus rhythm with sotalol but unfortunately developed significant fatigue with HR 40s.  He therefore had a PPM implanted. The patient reports doing very well since having a pacemaker implanted and remains very active despite his age.  He recently converted to ERI battery status.  He reports abrupt onset of fatigue and decreased exercise tolerance with ERI.  Today, he  denies symptoms of palpitations, chest pain, shortness of breath, orthopnea, PND, lower extremity edema, dizziness, presyncope, syncope, or neurologic sequela.  The patientis tolerating medications without difficulties and is otherwise without complaint today.   Past Medical History  Diagnosis Date  . Symptomatic bradycardia     s/p PPM by Dr Leonia Reeves 2004  . Persistent atrial fibrillation   . CAD (coronary artery disease)     s/p CABG 1991  . GERD (gastroesophageal reflux disease)   . Hyperlipidemia   . BPH (benign prostatic hyperplasia)   . DJD (degenerative joint disease)   . Diverticulosis   . Hypertension    Past Surgical History  Procedure Date  . Pacemaker insertion 05/14/03    MDT EnPulse implanted by Dr Leonia Reeves  . Coronary artery bypass graft     x5    History   Social History  . Marital Status: Married    Spouse Name: N/A    Number of Children: N/A  . Years of Education: N/A   Occupational History  . Not on file.   Social History Main Topics  . Smoking status: Former Research scientist (life sciences)  . Smokeless tobacco: Not on file  . Alcohol Use: No  . Drug Use: No  . Sexually Active: Not on file   Other Topics Concern  . 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   No Known  Allergies  Current Outpatient Prescriptions  Medication Sig Dispense Refill  . amLODipine (NORVASC) 5 MG tablet Take 5 mg by mouth daily.      . furosemide (LASIX) 40 MG tablet Take 60 mg by mouth daily.      [name] latanoprost (XALATAN) 0.005 % ophthalmic solution Place 1 drop into both eyes at bedtime.       . Multiple Vitamin (MULTIVITAMIN) tablet Take 1 tablet by mouth daily.      . nitroGLYCERIN (NITROSTAT) 0.4 MG SL tablet Place 0.4 mg under the tongue every 5 (five) minutes as needed. For chest pain.      [name] omeprazole (PRILOSEC) 20 MG capsule Take 20 mg by mouth daily.      . potassium chloride SA (K-DUR,KLOR-CON) 20 MEQ tablet Take 30 mEq by mouth daily.       . sotalol (BETAPACE) 160 MG tablet Take 160 mg by mouth 2 (two) times daily.       [name] terazosin (HYTRIN) 5 MG capsule Take 5 mg by mouth at bedtime.      . vitamin E 400 UNIT capsule Take 400 Units by mouth daily.      [name] warfarin (COUMADIN) 5 MG tablet Take 1.25-2.5 mg by mouth daily. Take 2.5mg  daily, except take 1.25mg  on Mondays, Wednesdays, and Fridays.       No current facility-administered medications for this visit.  Facility-Administered Medications Ordered in Other Visits  Medication Dose Route Frequency Provider Last Rate Last Dose  . ceFAZolin (ANCEF) IVPB 2 g/50 mL premix  2 g Intravenous On Call Thompson Grayer, MD      . gentamicin (GARAMYCIN) 80 mg in sodium chloride irrigation 0.9 % 500 mL irrigation  80 mg Irrigation On Call Thompson Grayer, MD        ROS- all systems are reviewed and negative except as per HPI  Physical Exam: Filed Vitals:   05/20/12 1636  BP: 114/62  Pulse: 65  Height: 5\' 10"  (1.778 m)  Weight: 186 lb (84.369 kg)  SpO2: 100%    GEN- The patient is well appearing, alert and oriented x 3 today.   Head- normocephalic, atraumatic Eyes-  Sclera clear, conjunctiva pink Ears- hearing intact Oropharynx- clear Neck- supple, no JVP Lymph- no cervical lymphadenopathy Lungs- Clear to ausculation  bilaterally, normal work of breathing Chest- pacemaker pocket is well healed Heart- Regular rate and rhythm, no murmurs, rubs or gallops, PMI not laterally displaced GI- soft, NT, ND, + BS Extremities- no clubbing, cyanosis, or edema MS- no significant deformity or atrophy Skin- no rash or lesion Psych- euthymic mood, full affect Neuro- strength and sensation are intact  Pacemaker interrogation from [facility] 05/19/12- reviewed in detail today and reveals ERI battery status  Assessment and Plan:

## 2012-05-24 ENCOUNTER — Ambulatory Visit (HOSPITAL_COMMUNITY): Payer: Medicare Other

## 2012-05-24 ENCOUNTER — Ambulatory Visit (HOSPITAL_COMMUNITY)
Admission: RE | Admit: 2012-05-24 | Discharge: 2012-05-24 | Disposition: A | Discharge status: Home / Self Care | Payer: Medicare Other | Source: Ambulatory Visit | Attending: Internal Medicine | Admitting: Internal Medicine

## 2012-05-24 ENCOUNTER — Encounter (HOSPITAL_COMMUNITY)
Admission: RE | Disposition: A | Discharge status: Home / Self Care | Payer: Self-pay | Source: Ambulatory Visit | Attending: Internal Medicine

## 2012-05-24 DIAGNOSIS — I495 Sick sinus syndrome: Secondary | ICD-10-CM | POA: Diagnosis not present

## 2012-05-24 DIAGNOSIS — I2510 Atherosclerotic heart disease of native coronary artery without angina pectoris: Secondary | ICD-10-CM | POA: Insufficient documentation

## 2012-05-24 DIAGNOSIS — I441 Atrioventricular block, second degree: Secondary | ICD-10-CM | POA: Diagnosis not present

## 2012-05-24 DIAGNOSIS — I499 Cardiac arrhythmia, unspecified: Secondary | ICD-10-CM | POA: Diagnosis not present

## 2012-05-24 DIAGNOSIS — I4891 Unspecified atrial fibrillation: Secondary | ICD-10-CM | POA: Diagnosis not present

## 2012-05-24 DIAGNOSIS — Z45018 Encounter for adjustment and management of other part of cardiac pacemaker: Secondary | ICD-10-CM | POA: Insufficient documentation

## 2012-05-24 DIAGNOSIS — R001 Bradycardia, unspecified: Secondary | ICD-10-CM | POA: Diagnosis present

## 2012-05-24 DIAGNOSIS — I10 Essential (primary) hypertension: Secondary | ICD-10-CM | POA: Insufficient documentation

## 2012-05-24 HISTORY — PX: PACEMAKER GENERATOR CHANGE: SHX5998

## 2012-05-24 HISTORY — PX: PERMANENT PACEMAKER GENERATOR CHANGE: SHX6022

## 2012-05-24 LAB — SURGICAL PCR SCREEN
MRSA, PCR: NEGATIVE
Staphylococcus aureus: NEGATIVE

## 2012-05-24 LAB — PROTIME-INR
INR: 2.03 — ABNORMAL HIGH (ref 0.00–1.49)
Prothrombin Time: 22.1 seconds — ABNORMAL HIGH (ref 11.6–15.2)

## 2012-05-24 SURGERY — PERMANENT PACEMAKER GENERATOR CHANGE
Anesthesia: LOCAL

## 2012-05-24 MED ORDER — SODIUM CHLORIDE 0.9 % IV SOLN: 250.0000 mL | INTRAVENOUS | Status: DC

## 2012-05-24 MED ORDER — SODIUM CHLORIDE 0.9 % IV SOLN: 250.0000 mL | INTRAVENOUS | Status: DC | PRN

## 2012-05-24 MED ORDER — SODIUM CHLORIDE 0.9 % IJ SOLN: 3.0000 mL | Freq: Two times a day (BID) | INTRAMUSCULAR | Status: DC

## 2012-05-24 MED ORDER — SODIUM CHLORIDE 0.9 % IJ SOLN: 3.0000 mL | INTRAMUSCULAR | Status: DC | PRN

## 2012-05-24 MED ORDER — ACETAMINOPHEN 325 MG PO TABS: 325.0000 mg | ORAL_TABLET | ORAL | Status: DC | PRN

## 2012-05-24 MED ORDER — MUPIROCIN 2 % EX OINT
TOPICAL_OINTMENT | Freq: Two times a day (BID) | CUTANEOUS | Status: DC
  Filled 2012-05-24: qty 22

## 2012-05-24 MED ORDER — LIDOCAINE HCL (PF) 1 % IJ SOLN
INTRAMUSCULAR | Status: AC
  Filled 2012-05-24: qty 60

## 2012-05-24 MED ORDER — CHLORHEXIDINE GLUCONATE 4 % EX LIQD
60.0000 mL | Freq: Once | CUTANEOUS | Status: DC
  Filled 2012-05-24: qty 60

## 2012-05-24 MED ORDER — MUPIROCIN 2 % EX OINT
TOPICAL_OINTMENT | CUTANEOUS | Status: AC
  Filled 2012-05-24: qty 22

## 2012-05-24 MED ORDER — SODIUM CHLORIDE 0.45 % IV SOLN: INTRAVENOUS | Status: DC

## 2012-05-24 MED ORDER — ONDANSETRON HCL 4 MG/2ML IJ SOLN: 4.0000 mg | Freq: Four times a day (QID) | INTRAMUSCULAR | Status: DC | PRN

## 2012-05-24 MED ORDER — HYDROCODONE-ACETAMINOPHEN 5-325 MG PO TABS: 1.0000 | ORAL_TABLET | ORAL | Status: DC | PRN

## 2012-05-24 MED ORDER — CEFAZOLIN SODIUM-DEXTROSE 2-3 GM-% IV SOLR
INTRAVENOUS | Status: AC
  Filled 2012-05-24: qty 50

## 2012-05-24 NOTE — Op Note (Signed)
SURGEON:  Thompson Grayer, MD     PREPROCEDURE DIAGNOSES:   1. Sick sinus syndrome.   2. Paroxsymal Atrial fibrillation.   3. Mobitz II second degree AV block    POSTPROCEDURE DIAGNOSES:   1. Sick sinus syndrome.   2. Paroxsymal Atrial fibrillation.   3. Mobitz II second degree AV block    PROCEDURES:   1. Pacemaker pulse generator replacement.   2. Skin pocket revision.     INTRODUCTION:  Jonathan Warner is a 78 y.o. male with a history of sick sinus syndrome and atrial fibrillation.  He has mobitz II second degree AV block upon interrogation today.  He has done well since his pacemaker was implanted.  He has recently reached ERI battery status.  He presents today for pacemaker pulse generator replacement.       DESCRIPTION OF THE PROCEDURE:  Informed written consent was obtained, and the patient was brought to the electrophysiology lab in the fasting state.  The patient's pacemaker was interrogated today and found to be at elective replacement indicator battery status.  The patient required no sedation for the procedure today.  The patient's left chest was prepped and draped in the usual sterile fashion by the EP lab staff.  The skin overlying the existing pacemaker was infiltrated with lidocaine for local analgesia.  A 4-cm incision was made over the pacemaker pocket.  Using a combination of sharp and blunt dissection, the pacemaker was exposed and removed from the body.  The device was disconnected from the leads.  There was no foreign matter or debris within the pocket.  The atrial lead was confirmed to be a Medtronic model [id number] (serial number [id number]) lead implanted on 05/14/2003.  The right ventricular lead was confirmed to be a Medtronic model [id number] (serial number [id number]) lead implanted on the same date as the atrial lead (above).  Both leads were examined and their integrity was confirmed to be intact.  Atrial lead P-waves measured 5.4 mV with impedance of 539 ohms and a threshold of 1.0  V at 0.5 msec.  Right ventricular lead R-waves measured 14.2 mV with impedance of 545 ohms and a threshold of 0.7 V at 0.5 msec.  Both leads were connected to a Medtronic adapt L model ADDDR 1 (serial number [id number]) pacemaker.  The pocket was revised to accommodate this new device.  Electrocautery was required to assure hemostasis.  The pocket was irrigated with copious gentamicin solution. The pacemaker was then placed into the pocket.  The pocket was then closed in 2 layers with 2-0 Vicryl suture over the subcutaneous and subcuticular layers.  Steri-Strips and a sterile dressing were then applied.  There were no early apparent complications.     CONCLUSIONS:   1. Successful pacemaker pulse generator replacement for elective replacement indicator battery status   2. No early apparent complications.     Thompson Grayer, MD 05/24/2012 3:03 PM

## 2012-05-24 NOTE — Interval H&P Note (Signed)
History and Physical Interval Note:  05/24/2012 2:00 PM  Jonathan Warner  has presented today for surgery, with the diagnosis of End of life  The various methods of treatment have been discussed with the patient and family. After consideration of risks, benefits and other options for treatment, the patient has consented to  Procedure(s) (LRB) with comments: PERMANENT PACEMAKER GENERATOR CHANGE (N/A) as a surgical intervention .  The patient's history has been reviewed, patient examined, no change in status, stable for surgery.  I have reviewed the patient's chart and labs.  Questions were answered to the patient's satisfaction.     Thompson Grayer

## 2012-05-24 NOTE — H&P (View-Only) (Signed)
Primary Cardiologist:  Dr [name]  Jonathan Warner is a 78 y.o. male with a h/o bradycardia sp PPM (MDT) by Dr Leonia Reeves who presents today to establish care in the Electrophysiology device clinic.  The patient presented in 2004 with atrial fibrillation requiring cardioversion.  He was able to maintain sinus rhythm with sotalol but unfortunately developed significant fatigue with HR 40s.  He therefore had a PPM implanted. The patient reports doing very well since having a pacemaker implanted and remains very active despite his age.  He recently converted to ERI battery status.  He reports abrupt onset of fatigue and decreased exercise tolerance with ERI.  Today, he  denies symptoms of palpitations, chest pain, shortness of breath, orthopnea, PND, lower extremity edema, dizziness, presyncope, syncope, or neurologic sequela.  The patientis tolerating medications without difficulties and is otherwise without complaint today.   Past Medical History  Diagnosis Date  . Symptomatic bradycardia     s/p PPM by Dr Leonia Reeves 2004  . Persistent atrial fibrillation   . CAD (coronary artery disease)     s/p CABG 1991  . GERD (gastroesophageal reflux disease)   . Hyperlipidemia   . BPH (benign prostatic hyperplasia)   . DJD (degenerative joint disease)   . Diverticulosis   . Hypertension    Past Surgical History  Procedure Date  . Pacemaker insertion 05/14/03    MDT EnPulse implanted by Dr Leonia Reeves  . Coronary artery bypass graft     x5    History   Social History  . Marital Status: Married    Spouse Name: N/A    Number of Children: N/A  . Years of Education: N/A   Occupational History  . Not on file.   Social History Main Topics  . Smoking status: Former Research scientist (life sciences)  . Smokeless tobacco: Not on file  . Alcohol Use: No  . Drug Use: No  . Sexually Active: Not on file   Other Topics Concern  . 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   No Known  Allergies  Current Outpatient Prescriptions  Medication Sig Dispense Refill  . amLODipine (NORVASC) 5 MG tablet Take 5 mg by mouth daily.      . furosemide (LASIX) 40 MG tablet Take 60 mg by mouth daily.      [name] latanoprost (XALATAN) 0.005 % ophthalmic solution Place 1 drop into both eyes at bedtime.       . Multiple Vitamin (MULTIVITAMIN) tablet Take 1 tablet by mouth daily.      . nitroGLYCERIN (NITROSTAT) 0.4 MG SL tablet Place 0.4 mg under the tongue every 5 (five) minutes as needed. For chest pain.      [name] omeprazole (PRILOSEC) 20 MG capsule Take 20 mg by mouth daily.      . potassium chloride SA (K-DUR,KLOR-CON) 20 MEQ tablet Take 30 mEq by mouth daily.       . sotalol (BETAPACE) 160 MG tablet Take 160 mg by mouth 2 (two) times daily.       [name] terazosin (HYTRIN) 5 MG capsule Take 5 mg by mouth at bedtime.      . vitamin E 400 UNIT capsule Take 400 Units by mouth daily.      [name] warfarin (COUMADIN) 5 MG tablet Take 1.25-2.5 mg by mouth daily. Take 2.5mg  daily, except take 1.25mg  on Mondays, Wednesdays, and Fridays.       No current facility-administered medications for this visit.  Facility-Administered Medications Ordered in Other Visits  Medication Dose Route Frequency Provider Last Rate Last Dose  . ceFAZolin (ANCEF) IVPB 2 g/50 mL premix  2 g Intravenous On Call Thompson Grayer, MD      . gentamicin (GARAMYCIN) 80 mg in sodium chloride irrigation 0.9 % 500 mL irrigation  80 mg Irrigation On Call Thompson Grayer, MD        ROS- all systems are reviewed and negative except as per HPI  Physical Exam: Filed Vitals:   05/20/12 1636  BP: 114/62  Pulse: 65  Height: 5\' 10"  (1.778 m)  Weight: 186 lb (84.369 kg)  SpO2: 100%    GEN- The patient is well appearing, alert and oriented x 3 today.   Head- normocephalic, atraumatic Eyes-  Sclera clear, conjunctiva pink Ears- hearing intact Oropharynx- clear Neck- supple, no JVP Lymph- no cervical lymphadenopathy Lungs- Clear to ausculation  bilaterally, normal work of breathing Chest- pacemaker pocket is well healed Heart- Regular rate and rhythm, no murmurs, rubs or gallops, PMI not laterally displaced GI- soft, NT, ND, + BS Extremities- no clubbing, cyanosis, or edema MS- no significant deformity or atrophy Skin- no rash or lesion Psych- euthymic mood, full affect Neuro- strength and sensation are intact  Pacemaker interrogation from [facility] 05/19/12- reviewed in detail today and reveals ERI battery status  Assessment and Plan:

## 2012-05-26 ENCOUNTER — Encounter: Payer: Medicare Other | Admitting: Internal Medicine

## 2012-05-26 ENCOUNTER — Ambulatory Visit: Payer: Medicare Other

## 2012-05-26 ENCOUNTER — Ambulatory Visit: Payer: Medicare Other | Admitting: Radiation Oncology

## 2012-05-30 ENCOUNTER — Encounter: Payer: Self-pay | Admitting: Radiation Oncology

## 2012-05-30 ENCOUNTER — Encounter: Payer: Self-pay | Admitting: *Deleted

## 2012-05-31 ENCOUNTER — Encounter: Payer: Self-pay | Admitting: Radiation Oncology

## 2012-05-31 ENCOUNTER — Ambulatory Visit
Admission: RE | Admit: 2012-05-31 | Discharge: 2012-05-31 | Disposition: A | Discharge status: Home / Self Care | Payer: Medicare Other | Source: Ambulatory Visit | Attending: Radiation Oncology | Admitting: Radiation Oncology

## 2012-05-31 VITALS — BP 117/57 | HR 68 | Temp 97.5°F | Resp 20 | Wt 190.2 lb

## 2012-05-31 DIAGNOSIS — I4891 Unspecified atrial fibrillation: Secondary | ICD-10-CM | POA: Insufficient documentation

## 2012-05-31 DIAGNOSIS — Z510 Encounter for antineoplastic radiation therapy: Secondary | ICD-10-CM | POA: Insufficient documentation

## 2012-05-31 DIAGNOSIS — N189 Chronic kidney disease, unspecified: Secondary | ICD-10-CM | POA: Insufficient documentation

## 2012-05-31 DIAGNOSIS — Z79899 Other long term (current) drug therapy: Secondary | ICD-10-CM | POA: Insufficient documentation

## 2012-05-31 DIAGNOSIS — Z7901 Long term (current) use of anticoagulants: Secondary | ICD-10-CM | POA: Diagnosis not present

## 2012-05-31 DIAGNOSIS — I129 Hypertensive chronic kidney disease with stage 1 through stage 4 chronic kidney disease, or unspecified chronic kidney disease: Secondary | ICD-10-CM | POA: Insufficient documentation

## 2012-05-31 DIAGNOSIS — Z800 Family history of malignant neoplasm of digestive organs: Secondary | ICD-10-CM | POA: Diagnosis not present

## 2012-05-31 DIAGNOSIS — C61 Malignant neoplasm of prostate: Secondary | ICD-10-CM | POA: Insufficient documentation

## 2012-05-31 DIAGNOSIS — E785 Hyperlipidemia, unspecified: Secondary | ICD-10-CM | POA: Insufficient documentation

## 2012-05-31 DIAGNOSIS — K219 Gastro-esophageal reflux disease without esophagitis: Secondary | ICD-10-CM | POA: Diagnosis not present

## 2012-05-31 DIAGNOSIS — Z950 Presence of cardiac pacemaker: Secondary | ICD-10-CM | POA: Insufficient documentation

## 2012-05-31 DIAGNOSIS — N529 Male erectile dysfunction, unspecified: Secondary | ICD-10-CM | POA: Insufficient documentation

## 2012-05-31 DIAGNOSIS — I2510 Atherosclerotic heart disease of native coronary artery without angina pectoris: Secondary | ICD-10-CM | POA: Diagnosis not present

## 2012-05-31 HISTORY — DX: Unspecified glaucoma: H40.9

## 2012-05-31 HISTORY — DX: Unspecified cataract: H26.9

## 2012-05-31 NOTE — Progress Notes (Addendum)
[facility] NEW PATIENT EVALUATION  Name: Jonathan Warner MRN: [id number]  Date:   05/31/2012           DOB: [birth year]  Status: outpatient   CC: Dr. Nehemiah Settle,  Franchot Gallo, MD , Dr. 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 male who is seen today for the courtesy of Dr. Franchot Gallo for evaluation of his clinical stage T2c intermediate to high-risk adenocarcinoma prostate. He was sent to Dr. Diona Fanti by Dr. Maxwell Caul after his then have an abnormal prostate exam. His PSA from May of 2012 was 4.54, rising to 5.93 this past summer. He underwent ultrasound-guided biopsies on 04/27/2012 was found to have extensive adenocarcinoma. 10 of 12 biopsies contained adenocarcinoma. His then have Gleason 7 (3+4) involving 30% of one core from right lateral mid gland, 20% of one core from the right mid gland, 60% of one core from the right lateral apex, 80% of one core from left lateral mid gland, 60% of one core from the left lateral apex. His then have Gleason 7 (4+3) involving 70% of one core from the right apex. His then have Gleason 6 (3+3) involving less than 5% of one core from the left lateral base, less than 5% of one core from the left base, 20% of one core from the left mid gland and 20% of one core from the left apex. His prostate volume was approximately 51 cc. He does have a significant cardiac history and has a cardiac pacemaker. His staging workup included a bone scan which was without evidence for metastatic disease. A CT scan of the abdomen and pelvis showed a 4.6 mm right lung base nodule the. Followup in 6-12 months is recommended. No evidence for metastatic disease. The right seminal vesicle is somewhat larger than the left. He still doing well from a GU and GI standpoint although he does  have an I PSS score 14 indicative of moderate obstructive urinary symptomatology. He has erectile dysfunction.  PREVIOUS RADIATION THERAPY: No   PAST MEDICAL HISTORY:  has a past medical history of Symptomatic bradycardia; Persistent atrial fibrillation; CAD (coronary artery disease); GERD (gastroesophageal reflux disease); Hyperlipidemia; BPH (benign prostatic hyperplasia); DJD (degenerative joint disease); Diverticulosis; Hypertension; Prostate cancer (04/27/12); Arthritis; A-fib; Inguinal hernia; Pulmonary nodules; Hiatal hernia; Heart murmur; DJD (degenerative joint disease); History of cardioversion; Coronary atherosclerosis; Heart attack; Chronic kidney disease; Undescended left testicle; History of shingles; Glaucoma; and Cataract.     PAST SURGICAL HISTORY:  Past Surgical History  Procedure Date  . Pacemaker insertion 05/14/03    MDT EnPulse implanted by Dr Leonia Reeves ddd  . Coronary artery bypass graft     x5  . Prostate biopsy 04/27/12    Adenocarcinoma,gleason=3+3=6, 3+4,& 4+3,PSA=4.93,volume=51cc  . Colonoscopy 12/21/2007    hx polyps, diverticulosis  . Cardiac catheterization 06/04/09    left w/noted patent bypass grafts     FAMILY HISTORY: His father died from "old age" at age 83. His mother died from cardiac disease at 43. No family history of prostate cancer. His brother died from pancreatic cancer.  SOCIAL HISTORY:  reports that he quit smoking today. His smoking use included Cigarettes. He has a 5 pack-year smoking history. He does not have any smokeless tobacco history on file. He reports that he does not drink alcohol or use illicit  drugs. Retired IT trainer. Married with one child.   ALLERGIES: Crestor   MEDICATIONS:  Current Outpatient Prescriptions  Medication Sig Dispense Refill  . acetaminophen (TYLENOL) 500 MG tablet Take 1,000 mg by mouth every 6 (six) hours as needed.      [name] amLODipine (NORVASC) 5 MG tablet Take 5 mg by mouth daily.      . furosemide  (LASIX) 40 MG tablet Take 60 mg by mouth daily.      [name] latanoprost (XALATAN) 0.005 % ophthalmic solution Place 1 drop into both eyes at bedtime.       . Multiple Vitamin (MULTIVITAMIN) tablet Take 1 tablet by mouth daily.      . nitroGLYCERIN (NITROSTAT) 0.4 MG SL tablet Place 0.4 mg under the tongue every 5 (five) minutes as needed. For chest pain.      [name] omeprazole (PRILOSEC) 20 MG capsule Take 20 mg by mouth daily.      . potassium chloride SA (K-DUR,KLOR-CON) 20 MEQ tablet Take 30 mEq by mouth daily.       . sotalol (BETAPACE) 160 MG tablet Take 160 mg by mouth 2 (two) times daily.       [name] terazosin (HYTRIN) 5 MG capsule Take 5 mg by mouth at bedtime.      . vitamin E 400 UNIT capsule Take 400 Units by mouth daily.      [name] warfarin (COUMADIN) 5 MG tablet Take 1.25-2.5 mg by mouth daily. Take 2.5mg  daily, except take 1.25mg  on Mondays, Wednesdays, and Fridays.         REVIEW OF SYSTEMS:  Pertinent items are noted in HPI.    PHYSICAL EXAM:  weight is 190 lb 3.2 oz (86.274 kg). His oral temperature is 97.5 F (36.4 C). His blood pressure is 117/57 and his pulse is 68. His respiration is 20.   Head and neck examination: Grossly unremarkable. Nodes: Without palpable cervical or supraclavicular lymphadenopathy. Chest: Left anterior pacemaker. Lungs clear. Heart: Regular in rhythm. Back: Without spinal or CVA tenderness. Abdomen: Soft without masses organomegaly. Genitalia: Grossly unremarkable. Rectal: The prostate gland is enlarged with firm nodularity along the right mid to lower gland along with soft nodularity along the left lateral mid gland. There is no definite periprostatic tumor extension. Extremities: Trace ankle edema. Neurologic examination: Grossly nonfocal.   LABORATORY DATA:  Lab Results  Component Value Date   WBC 4.8 05/20/2012   HGB 13.2 05/20/2012   HCT 38.3* 05/20/2012   MCV 98.2 05/20/2012   PLT 160 05/20/2012   Lab Results  Component Value Date   NA 140 05/20/2012   K  3.6 05/20/2012   CL 101 05/20/2012   CO2 32 05/20/2012   No results found for this basename: ALT, AST, GGT, ALKPHOS, BILITOT   PSA from 01/06/2011 4.54.   IMPRESSION: Clinical stage T2c intermediate to high-risk adenocarcinoma prostate. I explained to the patient and his wife that his prognosis is related to his stage, PSA level, and Gleason score. His clinical stage is of intermediate to poor favorability, his Gleason score of 7 is of intermediate favorability, while his PSA of less than 10 is favorable. Overall he intermediate to high-risk disease. Other prognostic factors include PSA doubling time and disease volume. He has significant disease volume. We discussed close surveillance versus potentially curative treatment including radiation therapy. I need to discuss his cardiac status with Dr. [name]. If he is felt to have a 5 year life expectancy, and I would offer him potentially curative external  beam radiation therapy. I do not feel that he is a candidate for seed implantation. Ordinarily, we would add, the very least, short-term androgen deprivation therapy for his high-risk disease, but this does increase his risk for a cardiac event. I discussed his case with Dr. Diona Fanti. Radiation therapy may delay the need for androgen deprivation therapy, and therefore have him avoid another cardiac event. I discussed the potential acute and late toxicities of radiation therapy. I will back in touch with the patient after speaking with Dr. [name]. If he proceeds with radiation therapy and I will need to as Dr. Diona Fanti to placed 3 gold markers within the prostate for image guidance.   PLAN: As discussed above.    I spent 60 minutes minutes face to face with the patient and more than 50% of that time was spent in counseling and/or coordination of care.

## 2012-05-31 NOTE — Progress Notes (Signed)
Please see the Nurse Progress Note in the MD Initial Consult Encounter for this patient. 

## 2012-05-31 NOTE — Progress Notes (Signed)
Patient here new consult Prostate cancer  Dx 04/27/12:Adenocarcinoma,gleason= 3+4=7, #3+3=6.& 4=3=7.,vOLUME=51CC,psa=4.93 01/2012=5.93 PSA 01/06/2011=4.54,   Married,Retired,1 son, sob with exertion 100% room air sats, No dysuria,no urgency or frequency, sometimes feels hesitant voiding,no nocturia,mo c/o pain or discomfort, normal bowel movements   Allergies:NKda   DD Pacemaker Replacement By Dr.James [name] 05/24/2012, for Dr. Daneen Schick, original   Pacemaker 05/14/2003 Dr.Edmunds,John H

## 2012-06-01 ENCOUNTER — Encounter: Payer: Self-pay | Admitting: Radiation Oncology

## 2012-06-01 NOTE — Progress Notes (Signed)
I spoke with Dr. Daneen Schick earlier today. From a cardiac standpoint, he feels that he has at least a 5-10 year life expectancy. Therefore, I feel that we should proceed with radiation therapy without androgen deprivation therapy to decrease his risk for having a cardiac event. He does have high volume disease which decreases success with radiation therapy, hopefully we can delay initiation of androgen deprivation therapy in the event that he is not cure. Again this would decrease his risk for a cardiac event. I spoke with the patient this evening, and we will, and he asked Dr. Diona Fanti to placed 3 gold seed markers and then have him return here for simulation/treatment planning.

## 2012-06-02 ENCOUNTER — Encounter: Payer: Self-pay | Admitting: Internal Medicine

## 2012-06-02 ENCOUNTER — Ambulatory Visit (INDEPENDENT_AMBULATORY_CARE_PROVIDER_SITE_OTHER): Payer: Medicare Other | Admitting: *Deleted

## 2012-06-02 DIAGNOSIS — I498 Other specified cardiac arrhythmias: Secondary | ICD-10-CM

## 2012-06-02 DIAGNOSIS — R001 Bradycardia, unspecified: Secondary | ICD-10-CM

## 2012-06-02 LAB — PACEMAKER DEVICE OBSERVATION
AL AMPLITUDE: 5.6 mv
AL IMPEDENCE PM: 510 Ohm
AL THRESHOLD: 0.75 V
ATRIAL PACING PM: 98
BAMS-0001: 150 {beats}/min
BATTERY VOLTAGE: 2.79 V
DEVICE MODEL PM: 276814
RV LEAD AMPLITUDE: 15.68 mv
RV LEAD IMPEDENCE PM: 584 Ohm
RV LEAD THRESHOLD: 0.5 V
VENTRICULAR PACING PM: 9

## 2012-06-02 NOTE — Progress Notes (Signed)
Wound check-PPM 

## 2012-06-03 ENCOUNTER — Telehealth: Payer: Self-pay | Admitting: *Deleted

## 2012-06-03 NOTE — Telephone Encounter (Signed)
CALLED PATIENT TO INFORM OF GOLD SEED PLACEMENT ON 06-20-12 AT 3:00 PM AT DR. DAHLSTEDT'S OFFICE AND HIS SIM ON 07-11-12 AT 10:00 AM AT DR. MURRAY'S OFFICE, SPOKE WITH PATIENT AND HE IS AWARE OF THESE APPTS.

## 2012-06-14 DIAGNOSIS — Z950 Presence of cardiac pacemaker: Secondary | ICD-10-CM | POA: Diagnosis not present

## 2012-06-14 DIAGNOSIS — I4891 Unspecified atrial fibrillation: Secondary | ICD-10-CM | POA: Diagnosis not present

## 2012-06-14 DIAGNOSIS — I2510 Atherosclerotic heart disease of native coronary artery without angina pectoris: Secondary | ICD-10-CM | POA: Diagnosis not present

## 2012-06-14 DIAGNOSIS — I10 Essential (primary) hypertension: Secondary | ICD-10-CM | POA: Diagnosis not present

## 2012-06-20 DIAGNOSIS — C61 Malignant neoplasm of prostate: Secondary | ICD-10-CM | POA: Diagnosis not present

## 2012-07-04 DIAGNOSIS — Z7901 Long term (current) use of anticoagulants: Secondary | ICD-10-CM | POA: Diagnosis not present

## 2012-07-04 DIAGNOSIS — I4891 Unspecified atrial fibrillation: Secondary | ICD-10-CM | POA: Diagnosis not present

## 2012-07-06 DIAGNOSIS — Q150 Congenital glaucoma: Secondary | ICD-10-CM | POA: Diagnosis not present

## 2012-07-06 DIAGNOSIS — H409 Unspecified glaucoma: Secondary | ICD-10-CM | POA: Diagnosis not present

## 2012-07-06 DIAGNOSIS — H401290 Low-tension glaucoma, unspecified eye, stage unspecified: Secondary | ICD-10-CM | POA: Diagnosis not present

## 2012-07-08 ENCOUNTER — Telehealth: Payer: Self-pay | Admitting: *Deleted

## 2012-07-08 NOTE — Telephone Encounter (Signed)
CALLED PATIENT TO ASK QUESTION, LVM FOR A RETURN CALL 

## 2012-07-11 ENCOUNTER — Telehealth: Payer: Self-pay | Admitting: Radiation Oncology

## 2012-07-11 ENCOUNTER — Ambulatory Visit
Admission: RE | Admit: 2012-07-11 | Discharge: 2012-07-11 | Disposition: A | Discharge status: Home / Self Care | Payer: Medicare Other | Source: Ambulatory Visit | Attending: Radiation Oncology | Admitting: Radiation Oncology

## 2012-07-11 ENCOUNTER — Other Ambulatory Visit: Payer: Self-pay | Admitting: Radiation Oncology

## 2012-07-11 DIAGNOSIS — I129 Hypertensive chronic kidney disease with stage 1 through stage 4 chronic kidney disease, or unspecified chronic kidney disease: Secondary | ICD-10-CM | POA: Diagnosis not present

## 2012-07-11 DIAGNOSIS — N189 Chronic kidney disease, unspecified: Secondary | ICD-10-CM | POA: Diagnosis not present

## 2012-07-11 DIAGNOSIS — C61 Malignant neoplasm of prostate: Secondary | ICD-10-CM | POA: Diagnosis not present

## 2012-07-11 DIAGNOSIS — Z510 Encounter for antineoplastic radiation therapy: Secondary | ICD-10-CM | POA: Diagnosis not present

## 2012-07-11 DIAGNOSIS — E785 Hyperlipidemia, unspecified: Secondary | ICD-10-CM | POA: Diagnosis not present

## 2012-07-11 DIAGNOSIS — I2510 Atherosclerotic heart disease of native coronary artery without angina pectoris: Secondary | ICD-10-CM | POA: Diagnosis not present

## 2012-07-11 DIAGNOSIS — N529 Male erectile dysfunction, unspecified: Secondary | ICD-10-CM | POA: Diagnosis not present

## 2012-07-11 NOTE — Progress Notes (Signed)
Simulation/treatment planning note: The patient was taken to the CT simulator. He was placed supine. A Vac Loc immobilization device was constructed. A red rubber tube was placed within the rectal vault. He was in catheterized and a urethrogram was performed. The catheter would not pass into the bladder. I contoured the prostate, seminal vesicles, rectum, and bladder. Dosimetry will contoured the femoral heads and bowel. I prescribing 7800 cGy in 40 sessions to his prostate. He'll be treated with IMRT. He is now ready for IMRT simulation/treatment planning.

## 2012-07-11 NOTE — Telephone Encounter (Signed)
Met with patient to discuss RO billing.  Dx: 185 Malignant neoplasm of prostate  Attending Rad:  Dr. Valere Dross  Rad Tx:  IMRT [id number]

## 2012-07-18 DIAGNOSIS — Z7901 Long term (current) use of anticoagulants: Secondary | ICD-10-CM | POA: Diagnosis not present

## 2012-07-18 DIAGNOSIS — I4891 Unspecified atrial fibrillation: Secondary | ICD-10-CM | POA: Diagnosis not present

## 2012-07-19 ENCOUNTER — Encounter: Payer: Self-pay | Admitting: Radiation Oncology

## 2012-07-19 DIAGNOSIS — I129 Hypertensive chronic kidney disease with stage 1 through stage 4 chronic kidney disease, or unspecified chronic kidney disease: Secondary | ICD-10-CM | POA: Diagnosis not present

## 2012-07-19 DIAGNOSIS — C61 Malignant neoplasm of prostate: Secondary | ICD-10-CM | POA: Diagnosis not present

## 2012-07-19 DIAGNOSIS — N529 Male erectile dysfunction, unspecified: Secondary | ICD-10-CM | POA: Diagnosis not present

## 2012-07-19 DIAGNOSIS — I2510 Atherosclerotic heart disease of native coronary artery without angina pectoris: Secondary | ICD-10-CM | POA: Diagnosis not present

## 2012-07-19 DIAGNOSIS — N189 Chronic kidney disease, unspecified: Secondary | ICD-10-CM | POA: Diagnosis not present

## 2012-07-19 DIAGNOSIS — Z510 Encounter for antineoplastic radiation therapy: Secondary | ICD-10-CM | POA: Diagnosis not present

## 2012-07-19 DIAGNOSIS — E785 Hyperlipidemia, unspecified: Secondary | ICD-10-CM | POA: Diagnosis not present

## 2012-07-19 NOTE — Progress Notes (Signed)
IMRT simulation/treatment planning note: Mr. daily completed his IMRT simulation/treatment planning in the management of his carcinoma the prostate. IMRT was chosen to decrease the risk for both acute and late bladder and rectal toxicity compared to conventional or 3-D conformal radiation therapy. Dose volume histograms were obtained for the target structures including the prostate and seminal vesicles. Dose volume histograms were obtained for the avoidance structures including the rectum, bladder, and femoral heads. Please see the electronic medical record for his specific dose volume histograms. We met our departmental target and avoidance goals. He'll undergo daily KV imaging, setting up to his 3 gold seeds in weekly cone beam CT to assess his bladder filling and seeds. I prescribing 7800 cGy in 40 sessions to his prostate PTV which represents the prostate was 0.8 cm except for 0.5 cm along the rectum. The seminal vesicles will receive 5600 cGy in 40 sessions.

## 2012-07-20 ENCOUNTER — Ambulatory Visit
Admission: RE | Admit: 2012-07-20 | Discharge: 2012-07-20 | Disposition: A | Discharge status: Home / Self Care | Payer: Medicare Other | Source: Ambulatory Visit | Attending: Radiation Oncology | Admitting: Radiation Oncology

## 2012-07-25 ENCOUNTER — Ambulatory Visit
Admission: RE | Admit: 2012-07-25 | Discharge: 2012-07-25 | Disposition: A | Discharge status: Home / Self Care | Payer: Medicare Other | Source: Ambulatory Visit | Attending: Radiation Oncology | Admitting: Radiation Oncology

## 2012-07-25 ENCOUNTER — Ambulatory Visit
Admission: RE | Admit: 2012-07-25 | Payer: Medicare Other | Source: Ambulatory Visit | Attending: Radiation Oncology | Admitting: Radiation Oncology

## 2012-07-25 ENCOUNTER — Encounter: Payer: Self-pay | Admitting: Radiation Oncology

## 2012-07-25 VITALS — BP 160/84 | HR 85 | Temp 98.4°F | Resp 20 | Wt 190.0 lb

## 2012-07-25 DIAGNOSIS — N189 Chronic kidney disease, unspecified: Secondary | ICD-10-CM | POA: Diagnosis not present

## 2012-07-25 DIAGNOSIS — C61 Malignant neoplasm of prostate: Secondary | ICD-10-CM

## 2012-07-25 DIAGNOSIS — I129 Hypertensive chronic kidney disease with stage 1 through stage 4 chronic kidney disease, or unspecified chronic kidney disease: Secondary | ICD-10-CM | POA: Diagnosis not present

## 2012-07-25 DIAGNOSIS — I2510 Atherosclerotic heart disease of native coronary artery without angina pectoris: Secondary | ICD-10-CM | POA: Diagnosis not present

## 2012-07-25 DIAGNOSIS — E785 Hyperlipidemia, unspecified: Secondary | ICD-10-CM | POA: Diagnosis not present

## 2012-07-25 DIAGNOSIS — N529 Male erectile dysfunction, unspecified: Secondary | ICD-10-CM | POA: Diagnosis not present

## 2012-07-25 DIAGNOSIS — Z510 Encounter for antineoplastic radiation therapy: Secondary | ICD-10-CM | POA: Diagnosis not present

## 2012-07-25 NOTE — Progress Notes (Signed)
Weekly Management Note:  Site: Prostate Current Dose:  195  cGy Projected Dose: 7800  cGy  Narrative: The patient is seen today for routine under treatment assessment. CBCT/MVCT images/port films were reviewed. The chart was reviewed.   Bladder filling satisfactory but not to the volume that was the time of simulation. He tells me that he had to urinate 1 hour prior to his treatment. No GI difficulties .  Physical Examination:  Filed Vitals:   07/25/12 1610  BP: 160/84  Pulse: 85  Temp: 98.4 F (36.9 C)  Resp: 20  .  Weight: 190 lb (86.183 kg). No change.  Impression: Tolerating radiation therapy well.  Plan: Continue radiation therapy as planned.

## 2012-07-25 NOTE — Progress Notes (Signed)
Weekly rad txs prostate: 1/40 compleetd, post sim teaching, radiation therapy and you book given, teach back given , no c/o dsyuria, no c/o of any issues, regular bowel movements 4:14 PM

## 2012-07-26 ENCOUNTER — Ambulatory Visit
Admission: RE | Admit: 2012-07-26 | Discharge: 2012-07-26 | Disposition: A | Discharge status: Home / Self Care | Payer: Medicare Other | Source: Ambulatory Visit | Attending: Radiation Oncology | Admitting: Radiation Oncology

## 2012-07-26 DIAGNOSIS — Z510 Encounter for antineoplastic radiation therapy: Secondary | ICD-10-CM | POA: Diagnosis not present

## 2012-07-26 DIAGNOSIS — N529 Male erectile dysfunction, unspecified: Secondary | ICD-10-CM | POA: Diagnosis not present

## 2012-07-26 DIAGNOSIS — C61 Malignant neoplasm of prostate: Secondary | ICD-10-CM | POA: Diagnosis not present

## 2012-07-26 DIAGNOSIS — E785 Hyperlipidemia, unspecified: Secondary | ICD-10-CM | POA: Diagnosis not present

## 2012-07-26 DIAGNOSIS — I2510 Atherosclerotic heart disease of native coronary artery without angina pectoris: Secondary | ICD-10-CM | POA: Diagnosis not present

## 2012-07-26 DIAGNOSIS — I129 Hypertensive chronic kidney disease with stage 1 through stage 4 chronic kidney disease, or unspecified chronic kidney disease: Secondary | ICD-10-CM | POA: Diagnosis not present

## 2012-07-26 DIAGNOSIS — N189 Chronic kidney disease, unspecified: Secondary | ICD-10-CM | POA: Diagnosis not present

## 2012-07-27 ENCOUNTER — Ambulatory Visit: Payer: Medicare Other

## 2012-07-27 ENCOUNTER — Ambulatory Visit
Admission: RE | Admit: 2012-07-27 | Discharge: 2012-07-27 | Disposition: A | Discharge status: Home / Self Care | Payer: Medicare Other | Source: Ambulatory Visit | Attending: Radiation Oncology | Admitting: Radiation Oncology

## 2012-07-27 DIAGNOSIS — I129 Hypertensive chronic kidney disease with stage 1 through stage 4 chronic kidney disease, or unspecified chronic kidney disease: Secondary | ICD-10-CM | POA: Diagnosis not present

## 2012-07-27 DIAGNOSIS — E785 Hyperlipidemia, unspecified: Secondary | ICD-10-CM | POA: Diagnosis not present

## 2012-07-27 DIAGNOSIS — I2510 Atherosclerotic heart disease of native coronary artery without angina pectoris: Secondary | ICD-10-CM | POA: Diagnosis not present

## 2012-07-27 DIAGNOSIS — N529 Male erectile dysfunction, unspecified: Secondary | ICD-10-CM | POA: Diagnosis not present

## 2012-07-27 DIAGNOSIS — Z510 Encounter for antineoplastic radiation therapy: Secondary | ICD-10-CM | POA: Diagnosis not present

## 2012-07-27 DIAGNOSIS — C61 Malignant neoplasm of prostate: Secondary | ICD-10-CM | POA: Diagnosis not present

## 2012-07-27 DIAGNOSIS — N189 Chronic kidney disease, unspecified: Secondary | ICD-10-CM | POA: Diagnosis not present

## 2012-07-28 ENCOUNTER — Ambulatory Visit
Admission: RE | Admit: 2012-07-28 | Discharge: 2012-07-28 | Disposition: A | Discharge status: Home / Self Care | Payer: Medicare Other | Source: Ambulatory Visit | Attending: Radiation Oncology | Admitting: Radiation Oncology

## 2012-07-28 DIAGNOSIS — C61 Malignant neoplasm of prostate: Secondary | ICD-10-CM | POA: Diagnosis not present

## 2012-07-28 DIAGNOSIS — Z510 Encounter for antineoplastic radiation therapy: Secondary | ICD-10-CM | POA: Diagnosis not present

## 2012-07-28 DIAGNOSIS — I129 Hypertensive chronic kidney disease with stage 1 through stage 4 chronic kidney disease, or unspecified chronic kidney disease: Secondary | ICD-10-CM | POA: Diagnosis not present

## 2012-07-28 DIAGNOSIS — N529 Male erectile dysfunction, unspecified: Secondary | ICD-10-CM | POA: Diagnosis not present

## 2012-07-28 DIAGNOSIS — I2510 Atherosclerotic heart disease of native coronary artery without angina pectoris: Secondary | ICD-10-CM | POA: Diagnosis not present

## 2012-07-28 DIAGNOSIS — N189 Chronic kidney disease, unspecified: Secondary | ICD-10-CM | POA: Diagnosis not present

## 2012-07-28 DIAGNOSIS — E785 Hyperlipidemia, unspecified: Secondary | ICD-10-CM | POA: Diagnosis not present

## 2012-07-29 ENCOUNTER — Ambulatory Visit
Admission: RE | Admit: 2012-07-29 | Discharge: 2012-07-29 | Disposition: A | Discharge status: Home / Self Care | Payer: Medicare Other | Source: Ambulatory Visit | Attending: Radiation Oncology | Admitting: Radiation Oncology

## 2012-07-29 DIAGNOSIS — Z510 Encounter for antineoplastic radiation therapy: Secondary | ICD-10-CM | POA: Diagnosis not present

## 2012-07-29 DIAGNOSIS — E785 Hyperlipidemia, unspecified: Secondary | ICD-10-CM | POA: Diagnosis not present

## 2012-07-29 DIAGNOSIS — N529 Male erectile dysfunction, unspecified: Secondary | ICD-10-CM | POA: Diagnosis not present

## 2012-07-29 DIAGNOSIS — I2510 Atherosclerotic heart disease of native coronary artery without angina pectoris: Secondary | ICD-10-CM | POA: Diagnosis not present

## 2012-07-29 DIAGNOSIS — I129 Hypertensive chronic kidney disease with stage 1 through stage 4 chronic kidney disease, or unspecified chronic kidney disease: Secondary | ICD-10-CM | POA: Diagnosis not present

## 2012-07-29 DIAGNOSIS — N189 Chronic kidney disease, unspecified: Secondary | ICD-10-CM | POA: Diagnosis not present

## 2012-07-29 DIAGNOSIS — C61 Malignant neoplasm of prostate: Secondary | ICD-10-CM | POA: Diagnosis not present

## 2012-08-01 ENCOUNTER — Ambulatory Visit
Admission: RE | Admit: 2012-08-01 | Discharge: 2012-08-01 | Disposition: A | Discharge status: Home / Self Care | Payer: Medicare Other | Source: Ambulatory Visit | Attending: Radiation Oncology | Admitting: Radiation Oncology

## 2012-08-01 ENCOUNTER — Encounter: Payer: Self-pay | Admitting: Radiation Oncology

## 2012-08-01 VITALS — BP 151/71 | HR 86 | Temp 97.8°F | Resp 20 | Wt 192.2 lb

## 2012-08-01 DIAGNOSIS — I129 Hypertensive chronic kidney disease with stage 1 through stage 4 chronic kidney disease, or unspecified chronic kidney disease: Secondary | ICD-10-CM | POA: Diagnosis not present

## 2012-08-01 DIAGNOSIS — N529 Male erectile dysfunction, unspecified: Secondary | ICD-10-CM | POA: Diagnosis not present

## 2012-08-01 DIAGNOSIS — Z510 Encounter for antineoplastic radiation therapy: Secondary | ICD-10-CM | POA: Diagnosis not present

## 2012-08-01 DIAGNOSIS — C61 Malignant neoplasm of prostate: Secondary | ICD-10-CM

## 2012-08-01 DIAGNOSIS — E785 Hyperlipidemia, unspecified: Secondary | ICD-10-CM | POA: Diagnosis not present

## 2012-08-01 DIAGNOSIS — N189 Chronic kidney disease, unspecified: Secondary | ICD-10-CM | POA: Diagnosis not present

## 2012-08-01 DIAGNOSIS — I2510 Atherosclerotic heart disease of native coronary artery without angina pectoris: Secondary | ICD-10-CM | POA: Diagnosis not present

## 2012-08-01 NOTE — Progress Notes (Signed)
Patient here weekly prostate rad txs:5/40 completed ACE inhibitor therapy was not prescribed due to . C/o dysuria, sometimes still has trouble starting his stream, bowels regular, no c/o pain 10:30 AM

## 2012-08-01 NOTE — Progress Notes (Signed)
Weekly Management Note:  Site: Prostate Current Dose:  975  cGy Projected Dose: 7800  cGy  Narrative: The patient is seen today for routine under treatment assessment. CBCT/MVCT images/port films were reviewed. The chart was reviewed.   Bladder filling is excellent. He does have some urinary hesitancy and a slow stream which was present prior to initiation of radiation therapy. His pretreatment I PSS score is 14. No GI difficulties.  Physical Examination:  Filed Vitals:   08/01/12 1025  BP: 151/71  Pulse: 86  Temp: 97.8 F (36.6 C)  Resp: 20  .  Weight: 192 lb 3.2 oz (87.181 kg). No change .  Impression: Tolerating radiation therapy well.  Plan: Continue radiation therapy as planned.

## 2012-08-02 ENCOUNTER — Ambulatory Visit
Admission: RE | Admit: 2012-08-02 | Discharge: 2012-08-02 | Disposition: A | Discharge status: Home / Self Care | Payer: Medicare Other | Source: Ambulatory Visit | Attending: Radiation Oncology | Admitting: Radiation Oncology

## 2012-08-02 DIAGNOSIS — I2510 Atherosclerotic heart disease of native coronary artery without angina pectoris: Secondary | ICD-10-CM | POA: Diagnosis not present

## 2012-08-02 DIAGNOSIS — C61 Malignant neoplasm of prostate: Secondary | ICD-10-CM | POA: Diagnosis not present

## 2012-08-02 DIAGNOSIS — N529 Male erectile dysfunction, unspecified: Secondary | ICD-10-CM | POA: Diagnosis not present

## 2012-08-02 DIAGNOSIS — I129 Hypertensive chronic kidney disease with stage 1 through stage 4 chronic kidney disease, or unspecified chronic kidney disease: Secondary | ICD-10-CM | POA: Diagnosis not present

## 2012-08-02 DIAGNOSIS — Z510 Encounter for antineoplastic radiation therapy: Secondary | ICD-10-CM | POA: Diagnosis not present

## 2012-08-02 DIAGNOSIS — E785 Hyperlipidemia, unspecified: Secondary | ICD-10-CM | POA: Diagnosis not present

## 2012-08-02 DIAGNOSIS — N189 Chronic kidney disease, unspecified: Secondary | ICD-10-CM | POA: Diagnosis not present

## 2012-08-03 ENCOUNTER — Ambulatory Visit
Admission: RE | Admit: 2012-08-03 | Discharge: 2012-08-03 | Disposition: A | Discharge status: Home / Self Care | Payer: Medicare Other | Source: Ambulatory Visit | Attending: Radiation Oncology | Admitting: Radiation Oncology

## 2012-08-03 DIAGNOSIS — Z510 Encounter for antineoplastic radiation therapy: Secondary | ICD-10-CM | POA: Diagnosis not present

## 2012-08-03 DIAGNOSIS — E785 Hyperlipidemia, unspecified: Secondary | ICD-10-CM | POA: Diagnosis not present

## 2012-08-03 DIAGNOSIS — I2510 Atherosclerotic heart disease of native coronary artery without angina pectoris: Secondary | ICD-10-CM | POA: Diagnosis not present

## 2012-08-03 DIAGNOSIS — C61 Malignant neoplasm of prostate: Secondary | ICD-10-CM | POA: Diagnosis not present

## 2012-08-03 DIAGNOSIS — N189 Chronic kidney disease, unspecified: Secondary | ICD-10-CM | POA: Diagnosis not present

## 2012-08-03 DIAGNOSIS — I129 Hypertensive chronic kidney disease with stage 1 through stage 4 chronic kidney disease, or unspecified chronic kidney disease: Secondary | ICD-10-CM | POA: Diagnosis not present

## 2012-08-03 DIAGNOSIS — N529 Male erectile dysfunction, unspecified: Secondary | ICD-10-CM | POA: Diagnosis not present

## 2012-08-04 ENCOUNTER — Ambulatory Visit
Admission: RE | Admit: 2012-08-04 | Discharge: 2012-08-04 | Disposition: A | Discharge status: Home / Self Care | Payer: Medicare Other | Source: Ambulatory Visit | Attending: Radiation Oncology | Admitting: Radiation Oncology

## 2012-08-04 DIAGNOSIS — N529 Male erectile dysfunction, unspecified: Secondary | ICD-10-CM | POA: Diagnosis not present

## 2012-08-04 DIAGNOSIS — C61 Malignant neoplasm of prostate: Secondary | ICD-10-CM | POA: Diagnosis not present

## 2012-08-04 DIAGNOSIS — I129 Hypertensive chronic kidney disease with stage 1 through stage 4 chronic kidney disease, or unspecified chronic kidney disease: Secondary | ICD-10-CM | POA: Diagnosis not present

## 2012-08-04 DIAGNOSIS — I2510 Atherosclerotic heart disease of native coronary artery without angina pectoris: Secondary | ICD-10-CM | POA: Diagnosis not present

## 2012-08-04 DIAGNOSIS — E785 Hyperlipidemia, unspecified: Secondary | ICD-10-CM | POA: Diagnosis not present

## 2012-08-04 DIAGNOSIS — Z510 Encounter for antineoplastic radiation therapy: Secondary | ICD-10-CM | POA: Diagnosis not present

## 2012-08-04 DIAGNOSIS — N189 Chronic kidney disease, unspecified: Secondary | ICD-10-CM | POA: Diagnosis not present

## 2012-08-05 ENCOUNTER — Ambulatory Visit
Admission: RE | Admit: 2012-08-05 | Discharge: 2012-08-05 | Disposition: A | Discharge status: Home / Self Care | Payer: Medicare Other | Source: Ambulatory Visit | Attending: Radiation Oncology | Admitting: Radiation Oncology

## 2012-08-05 DIAGNOSIS — N189 Chronic kidney disease, unspecified: Secondary | ICD-10-CM | POA: Diagnosis not present

## 2012-08-05 DIAGNOSIS — C61 Malignant neoplasm of prostate: Secondary | ICD-10-CM | POA: Diagnosis not present

## 2012-08-05 DIAGNOSIS — Z510 Encounter for antineoplastic radiation therapy: Secondary | ICD-10-CM | POA: Diagnosis not present

## 2012-08-05 DIAGNOSIS — N529 Male erectile dysfunction, unspecified: Secondary | ICD-10-CM | POA: Diagnosis not present

## 2012-08-05 DIAGNOSIS — E785 Hyperlipidemia, unspecified: Secondary | ICD-10-CM | POA: Diagnosis not present

## 2012-08-05 DIAGNOSIS — I2510 Atherosclerotic heart disease of native coronary artery without angina pectoris: Secondary | ICD-10-CM | POA: Diagnosis not present

## 2012-08-05 DIAGNOSIS — I129 Hypertensive chronic kidney disease with stage 1 through stage 4 chronic kidney disease, or unspecified chronic kidney disease: Secondary | ICD-10-CM | POA: Diagnosis not present

## 2012-08-08 ENCOUNTER — Ambulatory Visit
Admission: RE | Admit: 2012-08-08 | Discharge: 2012-08-08 | Disposition: A | Discharge status: Home / Self Care | Payer: Medicare Other | Source: Ambulatory Visit | Attending: Radiation Oncology | Admitting: Radiation Oncology

## 2012-08-08 ENCOUNTER — Encounter: Payer: Self-pay | Admitting: Radiation Oncology

## 2012-08-08 VITALS — BP 128/71 | HR 64 | Temp 97.8°F | Resp 20 | Wt 192.6 lb

## 2012-08-08 DIAGNOSIS — N529 Male erectile dysfunction, unspecified: Secondary | ICD-10-CM | POA: Diagnosis not present

## 2012-08-08 DIAGNOSIS — C61 Malignant neoplasm of prostate: Secondary | ICD-10-CM

## 2012-08-08 DIAGNOSIS — Z510 Encounter for antineoplastic radiation therapy: Secondary | ICD-10-CM | POA: Diagnosis not present

## 2012-08-08 DIAGNOSIS — N189 Chronic kidney disease, unspecified: Secondary | ICD-10-CM | POA: Diagnosis not present

## 2012-08-08 DIAGNOSIS — I129 Hypertensive chronic kidney disease with stage 1 through stage 4 chronic kidney disease, or unspecified chronic kidney disease: Secondary | ICD-10-CM | POA: Diagnosis not present

## 2012-08-08 DIAGNOSIS — E785 Hyperlipidemia, unspecified: Secondary | ICD-10-CM | POA: Diagnosis not present

## 2012-08-08 DIAGNOSIS — I2510 Atherosclerotic heart disease of native coronary artery without angina pectoris: Secondary | ICD-10-CM | POA: Diagnosis not present

## 2012-08-08 NOTE — Progress Notes (Signed)
Patient here rad txs 11/40 completed, alert,oriented x3, still has hesitancy and slow stream,bowels regular, had 1 time dysuria over the weekend 10:45 AM

## 2012-08-08 NOTE — Progress Notes (Signed)
Weekly Management Note:  Site: Prostate Current Dose:  2145  cGy Projected Dose: 7800  cGy  Narrative: The patient is seen today for routine under treatment assessment. CBCT/MVCT images/port films were reviewed. The chart was reviewed.   Bladder filling is excellent, no new GU or GI difficulties.  Physical Examination:  Filed Vitals:   08/08/12 1042  BP: 128/71  Pulse: 64  Temp: 97.8 F (36.6 C)  Resp: 20  .  Weight: 192 lb 9.6 oz (87.363 kg). No change.  Impression: Tolerating radiation therapy well.  Plan: Continue radiation therapy as planned.

## 2012-08-09 ENCOUNTER — Ambulatory Visit
Admission: RE | Admit: 2012-08-09 | Discharge: 2012-08-09 | Disposition: A | Discharge status: Home / Self Care | Payer: Medicare Other | Source: Ambulatory Visit | Attending: Radiation Oncology | Admitting: Radiation Oncology

## 2012-08-09 DIAGNOSIS — N189 Chronic kidney disease, unspecified: Secondary | ICD-10-CM | POA: Diagnosis not present

## 2012-08-09 DIAGNOSIS — N529 Male erectile dysfunction, unspecified: Secondary | ICD-10-CM | POA: Diagnosis not present

## 2012-08-09 DIAGNOSIS — Z510 Encounter for antineoplastic radiation therapy: Secondary | ICD-10-CM | POA: Diagnosis not present

## 2012-08-09 DIAGNOSIS — C61 Malignant neoplasm of prostate: Secondary | ICD-10-CM | POA: Diagnosis not present

## 2012-08-09 DIAGNOSIS — I129 Hypertensive chronic kidney disease with stage 1 through stage 4 chronic kidney disease, or unspecified chronic kidney disease: Secondary | ICD-10-CM | POA: Diagnosis not present

## 2012-08-09 DIAGNOSIS — E785 Hyperlipidemia, unspecified: Secondary | ICD-10-CM | POA: Diagnosis not present

## 2012-08-09 DIAGNOSIS — I2510 Atherosclerotic heart disease of native coronary artery without angina pectoris: Secondary | ICD-10-CM | POA: Diagnosis not present

## 2012-08-10 ENCOUNTER — Ambulatory Visit
Admission: RE | Admit: 2012-08-10 | Discharge: 2012-08-10 | Disposition: A | Discharge status: Home / Self Care | Payer: Medicare Other | Source: Ambulatory Visit | Attending: Radiation Oncology | Admitting: Radiation Oncology

## 2012-08-10 DIAGNOSIS — C61 Malignant neoplasm of prostate: Secondary | ICD-10-CM | POA: Diagnosis not present

## 2012-08-10 DIAGNOSIS — N189 Chronic kidney disease, unspecified: Secondary | ICD-10-CM | POA: Diagnosis not present

## 2012-08-10 DIAGNOSIS — E785 Hyperlipidemia, unspecified: Secondary | ICD-10-CM | POA: Diagnosis not present

## 2012-08-10 DIAGNOSIS — I129 Hypertensive chronic kidney disease with stage 1 through stage 4 chronic kidney disease, or unspecified chronic kidney disease: Secondary | ICD-10-CM | POA: Diagnosis not present

## 2012-08-10 DIAGNOSIS — Z510 Encounter for antineoplastic radiation therapy: Secondary | ICD-10-CM | POA: Diagnosis not present

## 2012-08-10 DIAGNOSIS — N529 Male erectile dysfunction, unspecified: Secondary | ICD-10-CM | POA: Diagnosis not present

## 2012-08-10 DIAGNOSIS — I2510 Atherosclerotic heart disease of native coronary artery without angina pectoris: Secondary | ICD-10-CM | POA: Diagnosis not present

## 2012-08-11 ENCOUNTER — Ambulatory Visit
Admission: RE | Admit: 2012-08-11 | Discharge: 2012-08-11 | Disposition: A | Discharge status: Home / Self Care | Payer: Medicare Other | Source: Ambulatory Visit | Attending: Radiation Oncology | Admitting: Radiation Oncology

## 2012-08-11 DIAGNOSIS — N529 Male erectile dysfunction, unspecified: Secondary | ICD-10-CM | POA: Diagnosis not present

## 2012-08-11 DIAGNOSIS — E785 Hyperlipidemia, unspecified: Secondary | ICD-10-CM | POA: Diagnosis not present

## 2012-08-11 DIAGNOSIS — Z510 Encounter for antineoplastic radiation therapy: Secondary | ICD-10-CM | POA: Diagnosis not present

## 2012-08-11 DIAGNOSIS — C61 Malignant neoplasm of prostate: Secondary | ICD-10-CM | POA: Diagnosis not present

## 2012-08-11 DIAGNOSIS — N189 Chronic kidney disease, unspecified: Secondary | ICD-10-CM | POA: Diagnosis not present

## 2012-08-11 DIAGNOSIS — I2510 Atherosclerotic heart disease of native coronary artery without angina pectoris: Secondary | ICD-10-CM | POA: Diagnosis not present

## 2012-08-11 DIAGNOSIS — I129 Hypertensive chronic kidney disease with stage 1 through stage 4 chronic kidney disease, or unspecified chronic kidney disease: Secondary | ICD-10-CM | POA: Diagnosis not present

## 2012-08-12 ENCOUNTER — Ambulatory Visit
Admission: RE | Admit: 2012-08-12 | Discharge: 2012-08-12 | Disposition: A | Discharge status: Home / Self Care | Payer: Medicare Other | Source: Ambulatory Visit | Attending: Radiation Oncology | Admitting: Radiation Oncology

## 2012-08-12 DIAGNOSIS — N189 Chronic kidney disease, unspecified: Secondary | ICD-10-CM | POA: Diagnosis not present

## 2012-08-12 DIAGNOSIS — N529 Male erectile dysfunction, unspecified: Secondary | ICD-10-CM | POA: Diagnosis not present

## 2012-08-12 DIAGNOSIS — I129 Hypertensive chronic kidney disease with stage 1 through stage 4 chronic kidney disease, or unspecified chronic kidney disease: Secondary | ICD-10-CM | POA: Diagnosis not present

## 2012-08-12 DIAGNOSIS — I2510 Atherosclerotic heart disease of native coronary artery without angina pectoris: Secondary | ICD-10-CM | POA: Diagnosis not present

## 2012-08-12 DIAGNOSIS — E785 Hyperlipidemia, unspecified: Secondary | ICD-10-CM | POA: Diagnosis not present

## 2012-08-12 DIAGNOSIS — Z510 Encounter for antineoplastic radiation therapy: Secondary | ICD-10-CM | POA: Diagnosis not present

## 2012-08-12 DIAGNOSIS — C61 Malignant neoplasm of prostate: Secondary | ICD-10-CM | POA: Diagnosis not present

## 2012-08-15 ENCOUNTER — Encounter: Payer: Self-pay | Admitting: Radiation Oncology

## 2012-08-15 ENCOUNTER — Ambulatory Visit
Admission: RE | Admit: 2012-08-15 | Discharge: 2012-08-15 | Disposition: A | Discharge status: Home / Self Care | Payer: Medicare Other | Source: Ambulatory Visit | Attending: Radiation Oncology | Admitting: Radiation Oncology

## 2012-08-15 ENCOUNTER — Ambulatory Visit: Admission: RE | Admit: 2012-08-15 | Payer: Medicare Other | Source: Ambulatory Visit | Admitting: Radiation Oncology

## 2012-08-15 VITALS — BP 149/82 | HR 92 | Temp 97.7°F | Resp 20 | Wt 194.2 lb

## 2012-08-15 DIAGNOSIS — C61 Malignant neoplasm of prostate: Secondary | ICD-10-CM

## 2012-08-15 DIAGNOSIS — I2510 Atherosclerotic heart disease of native coronary artery without angina pectoris: Secondary | ICD-10-CM | POA: Diagnosis not present

## 2012-08-15 DIAGNOSIS — E785 Hyperlipidemia, unspecified: Secondary | ICD-10-CM | POA: Diagnosis not present

## 2012-08-15 DIAGNOSIS — I129 Hypertensive chronic kidney disease with stage 1 through stage 4 chronic kidney disease, or unspecified chronic kidney disease: Secondary | ICD-10-CM | POA: Diagnosis not present

## 2012-08-15 DIAGNOSIS — N529 Male erectile dysfunction, unspecified: Secondary | ICD-10-CM | POA: Diagnosis not present

## 2012-08-15 DIAGNOSIS — Z510 Encounter for antineoplastic radiation therapy: Secondary | ICD-10-CM | POA: Diagnosis not present

## 2012-08-15 DIAGNOSIS — N189 Chronic kidney disease, unspecified: Secondary | ICD-10-CM | POA: Diagnosis not present

## 2012-08-15 NOTE — Progress Notes (Signed)
Patient here weekly prostate  rad txs, 16/40 Alert,oriented x3, has slight dysuria at times, and increased frequency stated, regular bowel movements 10:44 AM

## 2012-08-15 NOTE — Progress Notes (Signed)
Error in vitals before this correct respiration is 20 12:38 PM

## 2012-08-15 NOTE — Addendum Note (Signed)
Encounter addended by: Rebecca Eaton, RN on: 08/15/2012 12:39 PM<BR>     Documentation filed: Notes Section, Vitals Section

## 2012-08-15 NOTE — Progress Notes (Signed)
   Weekly Management Note:  Outpatient Current Dose:  31.2 Gy  Projected Dose: 78 Gy   Narrative:  The patient presents for routine under treatment assessment.  CBCT/MVCT images/Port film x-rays were reviewed.  The chart was checked. Doing well. He denies nocturia. He is taking Lasix. He reports that he has slight dysuria at times. He has increased frequency, particularly in the mornings after taking Lasix. His movements are regular.  Physical Findings:  weight is 194 lb 3.2 oz (88.089 kg). His oral temperature is 97.7 F (36.5 C). His blood pressure is 149/82 and his pulse is 92. His respiration is 92.  no acute distress. *Respiratory rate which is listed is incorrect.  Impression:  The patient is tolerating radiotherapy.  Plan:  Continue radiotherapy as planned.  ________________________________   Eppie Gibson, M.D.

## 2012-08-16 ENCOUNTER — Ambulatory Visit
Admission: RE | Admit: 2012-08-16 | Discharge: 2012-08-16 | Disposition: A | Discharge status: Home / Self Care | Payer: Medicare Other | Source: Ambulatory Visit | Attending: Radiation Oncology | Admitting: Radiation Oncology

## 2012-08-16 DIAGNOSIS — C61 Malignant neoplasm of prostate: Secondary | ICD-10-CM | POA: Diagnosis not present

## 2012-08-16 DIAGNOSIS — I129 Hypertensive chronic kidney disease with stage 1 through stage 4 chronic kidney disease, or unspecified chronic kidney disease: Secondary | ICD-10-CM | POA: Diagnosis not present

## 2012-08-16 DIAGNOSIS — E785 Hyperlipidemia, unspecified: Secondary | ICD-10-CM | POA: Diagnosis not present

## 2012-08-16 DIAGNOSIS — N529 Male erectile dysfunction, unspecified: Secondary | ICD-10-CM | POA: Diagnosis not present

## 2012-08-16 DIAGNOSIS — N189 Chronic kidney disease, unspecified: Secondary | ICD-10-CM | POA: Diagnosis not present

## 2012-08-16 DIAGNOSIS — Z510 Encounter for antineoplastic radiation therapy: Secondary | ICD-10-CM | POA: Diagnosis not present

## 2012-08-16 DIAGNOSIS — I2510 Atherosclerotic heart disease of native coronary artery without angina pectoris: Secondary | ICD-10-CM | POA: Diagnosis not present

## 2012-08-18 ENCOUNTER — Ambulatory Visit
Admission: RE | Admit: 2012-08-18 | Discharge: 2012-08-18 | Disposition: A | Discharge status: Home / Self Care | Payer: Medicare Other | Source: Ambulatory Visit | Attending: Radiation Oncology | Admitting: Radiation Oncology

## 2012-08-18 DIAGNOSIS — I129 Hypertensive chronic kidney disease with stage 1 through stage 4 chronic kidney disease, or unspecified chronic kidney disease: Secondary | ICD-10-CM | POA: Diagnosis not present

## 2012-08-18 DIAGNOSIS — N189 Chronic kidney disease, unspecified: Secondary | ICD-10-CM | POA: Diagnosis not present

## 2012-08-18 DIAGNOSIS — C61 Malignant neoplasm of prostate: Secondary | ICD-10-CM | POA: Diagnosis not present

## 2012-08-18 DIAGNOSIS — N529 Male erectile dysfunction, unspecified: Secondary | ICD-10-CM | POA: Diagnosis not present

## 2012-08-18 DIAGNOSIS — E785 Hyperlipidemia, unspecified: Secondary | ICD-10-CM | POA: Diagnosis not present

## 2012-08-18 DIAGNOSIS — Z510 Encounter for antineoplastic radiation therapy: Secondary | ICD-10-CM | POA: Diagnosis not present

## 2012-08-18 DIAGNOSIS — I2510 Atherosclerotic heart disease of native coronary artery without angina pectoris: Secondary | ICD-10-CM | POA: Diagnosis not present

## 2012-08-19 ENCOUNTER — Ambulatory Visit
Admission: RE | Admit: 2012-08-19 | Discharge: 2012-08-19 | Disposition: A | Discharge status: Home / Self Care | Payer: Medicare Other | Source: Ambulatory Visit | Attending: Radiation Oncology | Admitting: Radiation Oncology

## 2012-08-19 DIAGNOSIS — N529 Male erectile dysfunction, unspecified: Secondary | ICD-10-CM | POA: Diagnosis not present

## 2012-08-19 DIAGNOSIS — I2510 Atherosclerotic heart disease of native coronary artery without angina pectoris: Secondary | ICD-10-CM | POA: Diagnosis not present

## 2012-08-19 DIAGNOSIS — Z510 Encounter for antineoplastic radiation therapy: Secondary | ICD-10-CM | POA: Diagnosis not present

## 2012-08-19 DIAGNOSIS — E785 Hyperlipidemia, unspecified: Secondary | ICD-10-CM | POA: Diagnosis not present

## 2012-08-19 DIAGNOSIS — C61 Malignant neoplasm of prostate: Secondary | ICD-10-CM | POA: Diagnosis not present

## 2012-08-19 DIAGNOSIS — N189 Chronic kidney disease, unspecified: Secondary | ICD-10-CM | POA: Diagnosis not present

## 2012-08-19 DIAGNOSIS — I129 Hypertensive chronic kidney disease with stage 1 through stage 4 chronic kidney disease, or unspecified chronic kidney disease: Secondary | ICD-10-CM | POA: Diagnosis not present

## 2012-08-22 ENCOUNTER — Encounter: Payer: Self-pay | Admitting: Radiation Oncology

## 2012-08-22 ENCOUNTER — Ambulatory Visit
Admission: RE | Admit: 2012-08-22 | Discharge: 2012-08-22 | Disposition: A | Discharge status: Home / Self Care | Payer: Medicare Other | Source: Ambulatory Visit | Attending: Radiation Oncology | Admitting: Radiation Oncology

## 2012-08-22 VITALS — BP 141/67 | HR 68 | Resp 18 | Wt 190.7 lb

## 2012-08-22 DIAGNOSIS — N189 Chronic kidney disease, unspecified: Secondary | ICD-10-CM | POA: Diagnosis not present

## 2012-08-22 DIAGNOSIS — C61 Malignant neoplasm of prostate: Secondary | ICD-10-CM | POA: Diagnosis not present

## 2012-08-22 DIAGNOSIS — E785 Hyperlipidemia, unspecified: Secondary | ICD-10-CM | POA: Diagnosis not present

## 2012-08-22 DIAGNOSIS — I129 Hypertensive chronic kidney disease with stage 1 through stage 4 chronic kidney disease, or unspecified chronic kidney disease: Secondary | ICD-10-CM | POA: Diagnosis not present

## 2012-08-22 DIAGNOSIS — I2510 Atherosclerotic heart disease of native coronary artery without angina pectoris: Secondary | ICD-10-CM | POA: Diagnosis not present

## 2012-08-22 DIAGNOSIS — Z510 Encounter for antineoplastic radiation therapy: Secondary | ICD-10-CM | POA: Diagnosis not present

## 2012-08-22 DIAGNOSIS — N529 Male erectile dysfunction, unspecified: Secondary | ICD-10-CM | POA: Diagnosis not present

## 2012-08-22 NOTE — Progress Notes (Signed)
Patient presents to the clinic today unaccompanied for PUT with Dr. Valere Dross. Patient alert and oriented to person, place, and time. No distress noted. Steady gait noted. Pleasant affect noted. Patient denies pain at this time. Patient reports burning/painful urination only occasionally. Patient reports increased frequency during the day. Patient reports that it is difficulty to initiate the flow of urine. Patient reports difficulty completely emptying his bladder. Patient reports that he rarely gets up to void during the night. Patient reports that his hemorrhoids are a real problem and preparation H only helps a little. Patient denies hematuria. Reported all findings to Dr. Valere Dross.

## 2012-08-22 NOTE — Progress Notes (Signed)
Weekly Management Note:  Site: Prostate Current Dose:  3900  cGy Projected Dose: 7800  cGy  Narrative: The patient is seen today for routine under treatment assessment. CBCT/MVCT images/port films were reviewed. The chart was reviewed.   Bladder filling is satisfactory today, but not ideal. He has been having some slowing of his urinary stream with some difficulty emptying his bladder. He does have occasional burning. No GI difficulties. Of note is that he is on terazosin, 5 mg a day. I'm not sure if this is for outlet obstruction this or hypertension.  Physical Examination:  Filed Vitals:   08/22/12 1029  BP: 141/67  Pulse: 68  Resp: 18  .  Weight: 190 lb 11.2 oz (86.501 kg). No change.  Impression: Tolerating radiation therapy well.  Plan: Continue radiation therapy as planned.

## 2012-08-23 ENCOUNTER — Ambulatory Visit
Admission: RE | Admit: 2012-08-23 | Discharge: 2012-08-23 | Disposition: A | Discharge status: Home / Self Care | Payer: Medicare Other | Source: Ambulatory Visit | Attending: Radiation Oncology | Admitting: Radiation Oncology

## 2012-08-23 DIAGNOSIS — Z510 Encounter for antineoplastic radiation therapy: Secondary | ICD-10-CM | POA: Diagnosis not present

## 2012-08-23 DIAGNOSIS — I129 Hypertensive chronic kidney disease with stage 1 through stage 4 chronic kidney disease, or unspecified chronic kidney disease: Secondary | ICD-10-CM | POA: Diagnosis not present

## 2012-08-23 DIAGNOSIS — C61 Malignant neoplasm of prostate: Secondary | ICD-10-CM | POA: Diagnosis not present

## 2012-08-23 DIAGNOSIS — E785 Hyperlipidemia, unspecified: Secondary | ICD-10-CM | POA: Diagnosis not present

## 2012-08-23 DIAGNOSIS — N189 Chronic kidney disease, unspecified: Secondary | ICD-10-CM | POA: Diagnosis not present

## 2012-08-23 DIAGNOSIS — N529 Male erectile dysfunction, unspecified: Secondary | ICD-10-CM | POA: Diagnosis not present

## 2012-08-23 DIAGNOSIS — I2510 Atherosclerotic heart disease of native coronary artery without angina pectoris: Secondary | ICD-10-CM | POA: Diagnosis not present

## 2012-08-25 ENCOUNTER — Ambulatory Visit
Admission: RE | Admit: 2012-08-25 | Discharge: 2012-08-25 | Disposition: A | Discharge status: Home / Self Care | Payer: Medicare Other | Source: Ambulatory Visit | Attending: Radiation Oncology | Admitting: Radiation Oncology

## 2012-08-25 DIAGNOSIS — E785 Hyperlipidemia, unspecified: Secondary | ICD-10-CM | POA: Diagnosis not present

## 2012-08-25 DIAGNOSIS — N529 Male erectile dysfunction, unspecified: Secondary | ICD-10-CM | POA: Diagnosis not present

## 2012-08-25 DIAGNOSIS — Z950 Presence of cardiac pacemaker: Secondary | ICD-10-CM | POA: Diagnosis not present

## 2012-08-25 DIAGNOSIS — N189 Chronic kidney disease, unspecified: Secondary | ICD-10-CM | POA: Diagnosis not present

## 2012-08-25 DIAGNOSIS — I4891 Unspecified atrial fibrillation: Secondary | ICD-10-CM | POA: Diagnosis not present

## 2012-08-25 DIAGNOSIS — Z800 Family history of malignant neoplasm of digestive organs: Secondary | ICD-10-CM | POA: Diagnosis not present

## 2012-08-25 DIAGNOSIS — Z510 Encounter for antineoplastic radiation therapy: Secondary | ICD-10-CM | POA: Diagnosis not present

## 2012-08-25 DIAGNOSIS — I2510 Atherosclerotic heart disease of native coronary artery without angina pectoris: Secondary | ICD-10-CM | POA: Diagnosis not present

## 2012-08-25 DIAGNOSIS — K219 Gastro-esophageal reflux disease without esophagitis: Secondary | ICD-10-CM | POA: Diagnosis not present

## 2012-08-25 DIAGNOSIS — Z7901 Long term (current) use of anticoagulants: Secondary | ICD-10-CM | POA: Diagnosis not present

## 2012-08-25 DIAGNOSIS — Z79899 Other long term (current) drug therapy: Secondary | ICD-10-CM | POA: Diagnosis not present

## 2012-08-25 DIAGNOSIS — C61 Malignant neoplasm of prostate: Secondary | ICD-10-CM | POA: Diagnosis not present

## 2012-08-25 DIAGNOSIS — I129 Hypertensive chronic kidney disease with stage 1 through stage 4 chronic kidney disease, or unspecified chronic kidney disease: Secondary | ICD-10-CM | POA: Diagnosis not present

## 2012-08-26 ENCOUNTER — Ambulatory Visit
Admission: RE | Admit: 2012-08-26 | Discharge: 2012-08-26 | Disposition: A | Discharge status: Home / Self Care | Payer: Medicare Other | Source: Ambulatory Visit | Attending: Radiation Oncology | Admitting: Radiation Oncology

## 2012-08-26 DIAGNOSIS — C61 Malignant neoplasm of prostate: Secondary | ICD-10-CM | POA: Diagnosis not present

## 2012-08-29 ENCOUNTER — Ambulatory Visit
Admission: RE | Admit: 2012-08-29 | Discharge: 2012-08-29 | Disposition: A | Discharge status: Home / Self Care | Payer: Medicare Other | Source: Ambulatory Visit | Attending: Radiation Oncology | Admitting: Radiation Oncology

## 2012-08-29 ENCOUNTER — Encounter: Payer: Self-pay | Admitting: Radiation Oncology

## 2012-08-29 VITALS — BP 163/91 | HR 90 | Temp 98.0°F | Resp 20 | Wt 191.2 lb

## 2012-08-29 DIAGNOSIS — N189 Chronic kidney disease, unspecified: Secondary | ICD-10-CM | POA: Diagnosis not present

## 2012-08-29 DIAGNOSIS — E785 Hyperlipidemia, unspecified: Secondary | ICD-10-CM | POA: Insufficient documentation

## 2012-08-29 DIAGNOSIS — I129 Hypertensive chronic kidney disease with stage 1 through stage 4 chronic kidney disease, or unspecified chronic kidney disease: Secondary | ICD-10-CM | POA: Insufficient documentation

## 2012-08-29 DIAGNOSIS — Z510 Encounter for antineoplastic radiation therapy: Secondary | ICD-10-CM | POA: Insufficient documentation

## 2012-08-29 DIAGNOSIS — N529 Male erectile dysfunction, unspecified: Secondary | ICD-10-CM | POA: Diagnosis not present

## 2012-08-29 DIAGNOSIS — Z79899 Other long term (current) drug therapy: Secondary | ICD-10-CM | POA: Diagnosis not present

## 2012-08-29 DIAGNOSIS — Z800 Family history of malignant neoplasm of digestive organs: Secondary | ICD-10-CM | POA: Insufficient documentation

## 2012-08-29 DIAGNOSIS — K219 Gastro-esophageal reflux disease without esophagitis: Secondary | ICD-10-CM | POA: Diagnosis not present

## 2012-08-29 DIAGNOSIS — Z950 Presence of cardiac pacemaker: Secondary | ICD-10-CM | POA: Diagnosis not present

## 2012-08-29 DIAGNOSIS — Z7901 Long term (current) use of anticoagulants: Secondary | ICD-10-CM | POA: Insufficient documentation

## 2012-08-29 DIAGNOSIS — I4891 Unspecified atrial fibrillation: Secondary | ICD-10-CM | POA: Diagnosis not present

## 2012-08-29 DIAGNOSIS — C61 Malignant neoplasm of prostate: Secondary | ICD-10-CM

## 2012-08-29 DIAGNOSIS — I2510 Atherosclerotic heart disease of native coronary artery without angina pectoris: Secondary | ICD-10-CM | POA: Insufficient documentation

## 2012-08-29 NOTE — Progress Notes (Signed)
Weekly Management Note:  Site: Prostate Current Dose:  4680  cGy Projected Dose: 7800  cGy  Narrative: The patient is seen today for routine under treatment assessment. CBCT/MVCT images/port films were reviewed. The chart was reviewed.   He is doing well from a GU and GI standpoint. He states that his urinary stream is improved. I'm unable to visualize his cone beam CT today secondary to IT medical record issues.  Physical Examination:  Filed Vitals:   08/29/12 1023  BP: 163/91  Pulse: 90  Temp: 98 F (36.7 C)  Resp: 20  .  Weight: 191 lb 3.2 oz (86.728 kg). No change.  Impression: Tolerating radiation therapy well.  Plan: Continue radiation therapy as planned.

## 2012-08-29 NOTE — Progress Notes (Signed)
Patient here weekly rad txss prostate 24/40 completed, very little dysuria this week stated patient, also stream is better,empyting his bladder better most of the time also stated, regular bowel movements 10:25 AM

## 2012-08-30 ENCOUNTER — Ambulatory Visit
Admission: RE | Admit: 2012-08-30 | Discharge: 2012-08-30 | Disposition: A | Discharge status: Home / Self Care | Payer: Medicare Other | Source: Ambulatory Visit | Attending: Radiation Oncology | Admitting: Radiation Oncology

## 2012-08-30 DIAGNOSIS — I2510 Atherosclerotic heart disease of native coronary artery without angina pectoris: Secondary | ICD-10-CM | POA: Diagnosis not present

## 2012-08-30 DIAGNOSIS — I129 Hypertensive chronic kidney disease with stage 1 through stage 4 chronic kidney disease, or unspecified chronic kidney disease: Secondary | ICD-10-CM | POA: Diagnosis not present

## 2012-08-30 DIAGNOSIS — E785 Hyperlipidemia, unspecified: Secondary | ICD-10-CM | POA: Diagnosis not present

## 2012-08-30 DIAGNOSIS — N189 Chronic kidney disease, unspecified: Secondary | ICD-10-CM | POA: Diagnosis not present

## 2012-08-30 DIAGNOSIS — Z510 Encounter for antineoplastic radiation therapy: Secondary | ICD-10-CM | POA: Diagnosis not present

## 2012-08-30 DIAGNOSIS — C61 Malignant neoplasm of prostate: Secondary | ICD-10-CM | POA: Diagnosis not present

## 2012-08-30 DIAGNOSIS — N529 Male erectile dysfunction, unspecified: Secondary | ICD-10-CM | POA: Diagnosis not present

## 2012-08-31 ENCOUNTER — Ambulatory Visit
Admission: RE | Admit: 2012-08-31 | Discharge: 2012-08-31 | Disposition: A | Discharge status: Home / Self Care | Payer: Medicare Other | Source: Ambulatory Visit | Attending: Radiation Oncology | Admitting: Radiation Oncology

## 2012-08-31 DIAGNOSIS — N529 Male erectile dysfunction, unspecified: Secondary | ICD-10-CM | POA: Diagnosis not present

## 2012-08-31 DIAGNOSIS — Z510 Encounter for antineoplastic radiation therapy: Secondary | ICD-10-CM | POA: Diagnosis not present

## 2012-08-31 DIAGNOSIS — I129 Hypertensive chronic kidney disease with stage 1 through stage 4 chronic kidney disease, or unspecified chronic kidney disease: Secondary | ICD-10-CM | POA: Diagnosis not present

## 2012-08-31 DIAGNOSIS — C61 Malignant neoplasm of prostate: Secondary | ICD-10-CM | POA: Diagnosis not present

## 2012-08-31 DIAGNOSIS — I2510 Atherosclerotic heart disease of native coronary artery without angina pectoris: Secondary | ICD-10-CM | POA: Diagnosis not present

## 2012-08-31 DIAGNOSIS — N189 Chronic kidney disease, unspecified: Secondary | ICD-10-CM | POA: Diagnosis not present

## 2012-08-31 DIAGNOSIS — E785 Hyperlipidemia, unspecified: Secondary | ICD-10-CM | POA: Diagnosis not present

## 2012-09-01 ENCOUNTER — Ambulatory Visit
Admission: RE | Admit: 2012-09-01 | Discharge: 2012-09-01 | Disposition: A | Discharge status: Home / Self Care | Payer: Medicare Other | Source: Ambulatory Visit | Attending: Radiation Oncology | Admitting: Radiation Oncology

## 2012-09-01 DIAGNOSIS — Z510 Encounter for antineoplastic radiation therapy: Secondary | ICD-10-CM | POA: Diagnosis not present

## 2012-09-01 DIAGNOSIS — C61 Malignant neoplasm of prostate: Secondary | ICD-10-CM | POA: Diagnosis not present

## 2012-09-01 DIAGNOSIS — I2510 Atherosclerotic heart disease of native coronary artery without angina pectoris: Secondary | ICD-10-CM | POA: Diagnosis not present

## 2012-09-01 DIAGNOSIS — E785 Hyperlipidemia, unspecified: Secondary | ICD-10-CM | POA: Diagnosis not present

## 2012-09-01 DIAGNOSIS — N529 Male erectile dysfunction, unspecified: Secondary | ICD-10-CM | POA: Diagnosis not present

## 2012-09-01 DIAGNOSIS — N189 Chronic kidney disease, unspecified: Secondary | ICD-10-CM | POA: Diagnosis not present

## 2012-09-01 DIAGNOSIS — I129 Hypertensive chronic kidney disease with stage 1 through stage 4 chronic kidney disease, or unspecified chronic kidney disease: Secondary | ICD-10-CM | POA: Diagnosis not present

## 2012-09-02 ENCOUNTER — Ambulatory Visit
Admission: RE | Admit: 2012-09-02 | Discharge: 2012-09-02 | Disposition: A | Discharge status: Home / Self Care | Payer: Medicare Other | Source: Ambulatory Visit | Attending: Radiation Oncology | Admitting: Radiation Oncology

## 2012-09-02 DIAGNOSIS — N529 Male erectile dysfunction, unspecified: Secondary | ICD-10-CM | POA: Diagnosis not present

## 2012-09-02 DIAGNOSIS — C61 Malignant neoplasm of prostate: Secondary | ICD-10-CM | POA: Diagnosis not present

## 2012-09-02 DIAGNOSIS — E785 Hyperlipidemia, unspecified: Secondary | ICD-10-CM | POA: Diagnosis not present

## 2012-09-02 DIAGNOSIS — I2510 Atherosclerotic heart disease of native coronary artery without angina pectoris: Secondary | ICD-10-CM | POA: Diagnosis not present

## 2012-09-02 DIAGNOSIS — Z510 Encounter for antineoplastic radiation therapy: Secondary | ICD-10-CM | POA: Diagnosis not present

## 2012-09-02 DIAGNOSIS — N189 Chronic kidney disease, unspecified: Secondary | ICD-10-CM | POA: Diagnosis not present

## 2012-09-02 DIAGNOSIS — I129 Hypertensive chronic kidney disease with stage 1 through stage 4 chronic kidney disease, or unspecified chronic kidney disease: Secondary | ICD-10-CM | POA: Diagnosis not present

## 2012-09-05 ENCOUNTER — Ambulatory Visit
Admission: RE | Admit: 2012-09-05 | Discharge: 2012-09-05 | Disposition: A | Discharge status: Home / Self Care | Payer: Medicare Other | Source: Ambulatory Visit | Attending: Radiation Oncology | Admitting: Radiation Oncology

## 2012-09-05 ENCOUNTER — Encounter: Payer: Self-pay | Admitting: Radiation Oncology

## 2012-09-05 VITALS — BP 138/66 | HR 64 | Temp 98.4°F | Wt 192.4 lb

## 2012-09-05 DIAGNOSIS — E785 Hyperlipidemia, unspecified: Secondary | ICD-10-CM | POA: Diagnosis not present

## 2012-09-05 DIAGNOSIS — N189 Chronic kidney disease, unspecified: Secondary | ICD-10-CM | POA: Diagnosis not present

## 2012-09-05 DIAGNOSIS — C61 Malignant neoplasm of prostate: Secondary | ICD-10-CM

## 2012-09-05 DIAGNOSIS — Z510 Encounter for antineoplastic radiation therapy: Secondary | ICD-10-CM | POA: Diagnosis not present

## 2012-09-05 DIAGNOSIS — N529 Male erectile dysfunction, unspecified: Secondary | ICD-10-CM | POA: Diagnosis not present

## 2012-09-05 DIAGNOSIS — I129 Hypertensive chronic kidney disease with stage 1 through stage 4 chronic kidney disease, or unspecified chronic kidney disease: Secondary | ICD-10-CM | POA: Diagnosis not present

## 2012-09-05 DIAGNOSIS — I2510 Atherosclerotic heart disease of native coronary artery without angina pectoris: Secondary | ICD-10-CM | POA: Diagnosis not present

## 2012-09-05 NOTE — Progress Notes (Signed)
Weekly Management Note:  Site: Prostate Current Dose:  5655  cGy Projected Dose: 7800  cGy  Narrative: The patient is seen today for routine under treatment assessment. CBCT/MVCT images/port films were reviewed. The chart was reviewed.   Bladder filling is excellent. No significant GU or GI difficulties, although his stream is somewhat slow at times. He is on Hytrin, and I have not started Flomax. He really does not see much change in his urination compared to his urination prior to initiation of radiation therapy.  Physical Examination:  Filed Vitals:   09/05/12 1057  BP: 138/66  Pulse: 64  Temp: 98.4 F (36.9 C)  .  Weight: 192 lb 6.4 oz (87.272 kg). No change.  Impression: Tolerating radiation therapy well.  Plan: Continue radiation therapy as planned.

## 2012-09-05 NOTE — Progress Notes (Signed)
Mr. Feiertag has received 29/40 fractions to the pelvis for prostate cancer.  He reports urinary frequency with intermittent burning upon urination and "sometimes it is hard to get started." He notes that his urine is darker in the am with a strong odor.  He is currently on a diuretic which he takes in the am. He denies any rectal discomfort and states his hemorhoids are "giving me a fit" and he notices blood on tissue occasionally after wiping.  Using Preparation-H, so he was cautioned to use it only after treatment and avoid use prior to daily treatment.

## 2012-09-06 ENCOUNTER — Ambulatory Visit
Admission: RE | Admit: 2012-09-06 | Discharge: 2012-09-06 | Disposition: A | Discharge status: Home / Self Care | Payer: Medicare Other | Source: Ambulatory Visit | Attending: Radiation Oncology | Admitting: Radiation Oncology

## 2012-09-06 DIAGNOSIS — E785 Hyperlipidemia, unspecified: Secondary | ICD-10-CM | POA: Diagnosis not present

## 2012-09-06 DIAGNOSIS — C61 Malignant neoplasm of prostate: Secondary | ICD-10-CM | POA: Diagnosis not present

## 2012-09-06 DIAGNOSIS — N529 Male erectile dysfunction, unspecified: Secondary | ICD-10-CM | POA: Diagnosis not present

## 2012-09-06 DIAGNOSIS — N189 Chronic kidney disease, unspecified: Secondary | ICD-10-CM | POA: Diagnosis not present

## 2012-09-06 DIAGNOSIS — Z510 Encounter for antineoplastic radiation therapy: Secondary | ICD-10-CM | POA: Diagnosis not present

## 2012-09-06 DIAGNOSIS — I129 Hypertensive chronic kidney disease with stage 1 through stage 4 chronic kidney disease, or unspecified chronic kidney disease: Secondary | ICD-10-CM | POA: Diagnosis not present

## 2012-09-06 DIAGNOSIS — I2510 Atherosclerotic heart disease of native coronary artery without angina pectoris: Secondary | ICD-10-CM | POA: Diagnosis not present

## 2012-09-07 ENCOUNTER — Ambulatory Visit
Admission: RE | Admit: 2012-09-07 | Discharge: 2012-09-07 | Disposition: A | Discharge status: Home / Self Care | Payer: Medicare Other | Source: Ambulatory Visit | Attending: Radiation Oncology | Admitting: Radiation Oncology

## 2012-09-07 DIAGNOSIS — E785 Hyperlipidemia, unspecified: Secondary | ICD-10-CM | POA: Diagnosis not present

## 2012-09-07 DIAGNOSIS — N529 Male erectile dysfunction, unspecified: Secondary | ICD-10-CM | POA: Diagnosis not present

## 2012-09-07 DIAGNOSIS — C61 Malignant neoplasm of prostate: Secondary | ICD-10-CM | POA: Diagnosis not present

## 2012-09-07 DIAGNOSIS — I2510 Atherosclerotic heart disease of native coronary artery without angina pectoris: Secondary | ICD-10-CM | POA: Diagnosis not present

## 2012-09-07 DIAGNOSIS — I129 Hypertensive chronic kidney disease with stage 1 through stage 4 chronic kidney disease, or unspecified chronic kidney disease: Secondary | ICD-10-CM | POA: Diagnosis not present

## 2012-09-07 DIAGNOSIS — N189 Chronic kidney disease, unspecified: Secondary | ICD-10-CM | POA: Diagnosis not present

## 2012-09-07 DIAGNOSIS — Z510 Encounter for antineoplastic radiation therapy: Secondary | ICD-10-CM | POA: Diagnosis not present

## 2012-09-08 ENCOUNTER — Ambulatory Visit
Admission: RE | Admit: 2012-09-08 | Discharge: 2012-09-08 | Disposition: A | Discharge status: Home / Self Care | Payer: Medicare Other | Source: Ambulatory Visit | Attending: Radiation Oncology | Admitting: Radiation Oncology

## 2012-09-08 DIAGNOSIS — Z510 Encounter for antineoplastic radiation therapy: Secondary | ICD-10-CM | POA: Diagnosis not present

## 2012-09-08 DIAGNOSIS — C61 Malignant neoplasm of prostate: Secondary | ICD-10-CM | POA: Diagnosis not present

## 2012-09-08 DIAGNOSIS — N189 Chronic kidney disease, unspecified: Secondary | ICD-10-CM | POA: Diagnosis not present

## 2012-09-08 DIAGNOSIS — I129 Hypertensive chronic kidney disease with stage 1 through stage 4 chronic kidney disease, or unspecified chronic kidney disease: Secondary | ICD-10-CM | POA: Diagnosis not present

## 2012-09-08 DIAGNOSIS — E785 Hyperlipidemia, unspecified: Secondary | ICD-10-CM | POA: Diagnosis not present

## 2012-09-08 DIAGNOSIS — I2510 Atherosclerotic heart disease of native coronary artery without angina pectoris: Secondary | ICD-10-CM | POA: Diagnosis not present

## 2012-09-08 DIAGNOSIS — N529 Male erectile dysfunction, unspecified: Secondary | ICD-10-CM | POA: Diagnosis not present

## 2012-09-09 ENCOUNTER — Ambulatory Visit
Admission: RE | Admit: 2012-09-09 | Discharge: 2012-09-09 | Disposition: A | Discharge status: Home / Self Care | Payer: Medicare Other | Source: Ambulatory Visit | Attending: Radiation Oncology | Admitting: Radiation Oncology

## 2012-09-09 DIAGNOSIS — Z510 Encounter for antineoplastic radiation therapy: Secondary | ICD-10-CM | POA: Diagnosis not present

## 2012-09-09 DIAGNOSIS — I129 Hypertensive chronic kidney disease with stage 1 through stage 4 chronic kidney disease, or unspecified chronic kidney disease: Secondary | ICD-10-CM | POA: Diagnosis not present

## 2012-09-09 DIAGNOSIS — N529 Male erectile dysfunction, unspecified: Secondary | ICD-10-CM | POA: Diagnosis not present

## 2012-09-09 DIAGNOSIS — E785 Hyperlipidemia, unspecified: Secondary | ICD-10-CM | POA: Diagnosis not present

## 2012-09-09 DIAGNOSIS — N189 Chronic kidney disease, unspecified: Secondary | ICD-10-CM | POA: Diagnosis not present

## 2012-09-09 DIAGNOSIS — C61 Malignant neoplasm of prostate: Secondary | ICD-10-CM | POA: Diagnosis not present

## 2012-09-09 DIAGNOSIS — I2510 Atherosclerotic heart disease of native coronary artery without angina pectoris: Secondary | ICD-10-CM | POA: Diagnosis not present

## 2012-09-12 ENCOUNTER — Ambulatory Visit
Admission: RE | Admit: 2012-09-12 | Discharge: 2012-09-12 | Disposition: A | Discharge status: Home / Self Care | Payer: Medicare Other | Source: Ambulatory Visit | Attending: Radiation Oncology | Admitting: Radiation Oncology

## 2012-09-12 ENCOUNTER — Encounter: Payer: Self-pay | Admitting: Radiation Oncology

## 2012-09-12 VITALS — BP 156/86 | HR 84 | Temp 98.4°F | Resp 20 | Wt 192.2 lb

## 2012-09-12 DIAGNOSIS — E785 Hyperlipidemia, unspecified: Secondary | ICD-10-CM | POA: Diagnosis not present

## 2012-09-12 DIAGNOSIS — C61 Malignant neoplasm of prostate: Secondary | ICD-10-CM

## 2012-09-12 DIAGNOSIS — N189 Chronic kidney disease, unspecified: Secondary | ICD-10-CM | POA: Diagnosis not present

## 2012-09-12 DIAGNOSIS — Z510 Encounter for antineoplastic radiation therapy: Secondary | ICD-10-CM | POA: Diagnosis not present

## 2012-09-12 DIAGNOSIS — I129 Hypertensive chronic kidney disease with stage 1 through stage 4 chronic kidney disease, or unspecified chronic kidney disease: Secondary | ICD-10-CM | POA: Diagnosis not present

## 2012-09-12 DIAGNOSIS — N529 Male erectile dysfunction, unspecified: Secondary | ICD-10-CM | POA: Diagnosis not present

## 2012-09-12 DIAGNOSIS — I2510 Atherosclerotic heart disease of native coronary artery without angina pectoris: Secondary | ICD-10-CM | POA: Diagnosis not present

## 2012-09-12 NOTE — Progress Notes (Signed)
Weekly Management Note:  Site: Prostate Current Dose:  6435  cGy Projected Dose: 7800  cGy  Narrative: The patient is seen today for routine under treatment assessment. CBCT/MVCT images/port films were reviewed. The chart was reviewed. Bladder filling is satisfactory.  Doing well from a GU and GI standpoint although he does not feel that he empties his bladder completely. He is on terazosin, presumably for his hypertension.  Physical Examination:  Filed Vitals:   09/12/12 1023  BP: 156/86  Pulse: 84  Temp: 98.4 F (36.9 C)  Resp: 20  .  Weight: 192 lb 3.2 oz (87.181 kg). No change.  Impression: Tolerating radiation therapy well. He may be developing worsening urinary obstructive symptoms, which I expect will improve within a few weeks after he finishes his radiation therapy. I see no need to adjust his terazosin or add tamsulosin.  Plan: Continue radiation therapy as planned.

## 2012-09-12 NOTE — Progress Notes (Signed)
Patient here weekly rad treatments  prostate 33/40 completed, doesn't empty his bladder completely stated, no dysuria, does have frequency but takes lasix before treatment in the mornings, sugessted to take his lasix after rad tx instead,  10:27 AM

## 2012-09-13 ENCOUNTER — Ambulatory Visit
Admission: RE | Admit: 2012-09-13 | Discharge: 2012-09-13 | Disposition: A | Discharge status: Home / Self Care | Payer: Medicare Other | Source: Ambulatory Visit | Attending: Radiation Oncology | Admitting: Radiation Oncology

## 2012-09-13 DIAGNOSIS — C61 Malignant neoplasm of prostate: Secondary | ICD-10-CM | POA: Diagnosis not present

## 2012-09-13 DIAGNOSIS — E785 Hyperlipidemia, unspecified: Secondary | ICD-10-CM | POA: Diagnosis not present

## 2012-09-13 DIAGNOSIS — N189 Chronic kidney disease, unspecified: Secondary | ICD-10-CM | POA: Diagnosis not present

## 2012-09-13 DIAGNOSIS — N529 Male erectile dysfunction, unspecified: Secondary | ICD-10-CM | POA: Diagnosis not present

## 2012-09-13 DIAGNOSIS — I129 Hypertensive chronic kidney disease with stage 1 through stage 4 chronic kidney disease, or unspecified chronic kidney disease: Secondary | ICD-10-CM | POA: Diagnosis not present

## 2012-09-13 DIAGNOSIS — Z510 Encounter for antineoplastic radiation therapy: Secondary | ICD-10-CM | POA: Diagnosis not present

## 2012-09-13 DIAGNOSIS — H401290 Low-tension glaucoma, unspecified eye, stage unspecified: Secondary | ICD-10-CM | POA: Diagnosis not present

## 2012-09-13 DIAGNOSIS — I2510 Atherosclerotic heart disease of native coronary artery without angina pectoris: Secondary | ICD-10-CM | POA: Diagnosis not present

## 2012-09-14 ENCOUNTER — Ambulatory Visit
Admission: RE | Admit: 2012-09-14 | Discharge: 2012-09-14 | Disposition: A | Discharge status: Home / Self Care | Payer: Medicare Other | Source: Ambulatory Visit | Attending: Radiation Oncology | Admitting: Radiation Oncology

## 2012-09-14 DIAGNOSIS — Z510 Encounter for antineoplastic radiation therapy: Secondary | ICD-10-CM | POA: Diagnosis not present

## 2012-09-14 DIAGNOSIS — I129 Hypertensive chronic kidney disease with stage 1 through stage 4 chronic kidney disease, or unspecified chronic kidney disease: Secondary | ICD-10-CM | POA: Diagnosis not present

## 2012-09-14 DIAGNOSIS — I2510 Atherosclerotic heart disease of native coronary artery without angina pectoris: Secondary | ICD-10-CM | POA: Diagnosis not present

## 2012-09-14 DIAGNOSIS — E785 Hyperlipidemia, unspecified: Secondary | ICD-10-CM | POA: Diagnosis not present

## 2012-09-14 DIAGNOSIS — C61 Malignant neoplasm of prostate: Secondary | ICD-10-CM | POA: Diagnosis not present

## 2012-09-14 DIAGNOSIS — N189 Chronic kidney disease, unspecified: Secondary | ICD-10-CM | POA: Diagnosis not present

## 2012-09-14 DIAGNOSIS — N529 Male erectile dysfunction, unspecified: Secondary | ICD-10-CM | POA: Diagnosis not present

## 2012-09-15 ENCOUNTER — Ambulatory Visit
Admission: RE | Admit: 2012-09-15 | Discharge: 2012-09-15 | Disposition: A | Discharge status: Home / Self Care | Payer: Medicare Other | Source: Ambulatory Visit | Attending: Radiation Oncology | Admitting: Radiation Oncology

## 2012-09-15 DIAGNOSIS — C61 Malignant neoplasm of prostate: Secondary | ICD-10-CM | POA: Diagnosis not present

## 2012-09-15 DIAGNOSIS — I129 Hypertensive chronic kidney disease with stage 1 through stage 4 chronic kidney disease, or unspecified chronic kidney disease: Secondary | ICD-10-CM | POA: Diagnosis not present

## 2012-09-15 DIAGNOSIS — Z510 Encounter for antineoplastic radiation therapy: Secondary | ICD-10-CM | POA: Diagnosis not present

## 2012-09-15 DIAGNOSIS — E785 Hyperlipidemia, unspecified: Secondary | ICD-10-CM | POA: Diagnosis not present

## 2012-09-15 DIAGNOSIS — I2510 Atherosclerotic heart disease of native coronary artery without angina pectoris: Secondary | ICD-10-CM | POA: Diagnosis not present

## 2012-09-15 DIAGNOSIS — N529 Male erectile dysfunction, unspecified: Secondary | ICD-10-CM | POA: Diagnosis not present

## 2012-09-15 DIAGNOSIS — N189 Chronic kidney disease, unspecified: Secondary | ICD-10-CM | POA: Diagnosis not present

## 2012-09-16 ENCOUNTER — Ambulatory Visit
Admission: RE | Admit: 2012-09-16 | Discharge: 2012-09-16 | Disposition: A | Discharge status: Home / Self Care | Payer: Medicare Other | Source: Ambulatory Visit | Attending: Radiation Oncology | Admitting: Radiation Oncology

## 2012-09-16 DIAGNOSIS — N189 Chronic kidney disease, unspecified: Secondary | ICD-10-CM | POA: Diagnosis not present

## 2012-09-16 DIAGNOSIS — N529 Male erectile dysfunction, unspecified: Secondary | ICD-10-CM | POA: Diagnosis not present

## 2012-09-16 DIAGNOSIS — I129 Hypertensive chronic kidney disease with stage 1 through stage 4 chronic kidney disease, or unspecified chronic kidney disease: Secondary | ICD-10-CM | POA: Diagnosis not present

## 2012-09-16 DIAGNOSIS — E785 Hyperlipidemia, unspecified: Secondary | ICD-10-CM | POA: Diagnosis not present

## 2012-09-16 DIAGNOSIS — C61 Malignant neoplasm of prostate: Secondary | ICD-10-CM | POA: Diagnosis not present

## 2012-09-16 DIAGNOSIS — Z510 Encounter for antineoplastic radiation therapy: Secondary | ICD-10-CM | POA: Diagnosis not present

## 2012-09-16 DIAGNOSIS — I2510 Atherosclerotic heart disease of native coronary artery without angina pectoris: Secondary | ICD-10-CM | POA: Diagnosis not present

## 2012-09-18 ENCOUNTER — Encounter: Payer: Self-pay | Admitting: Radiation Oncology

## 2012-09-18 NOTE — Progress Notes (Signed)
Chart note: On 07/25/2012 Jonathan Warner began his IMRT in the management of his carcinoma the prostate. He was treated with 2 modulated arcs with dynamic multileaf collimator settings. This corresponded to one set of IMRT treatment devices [phone]).

## 2012-09-19 ENCOUNTER — Ambulatory Visit
Admission: RE | Admit: 2012-09-19 | Discharge: 2012-09-19 | Disposition: A | Discharge status: Home / Self Care | Payer: Medicare Other | Source: Ambulatory Visit | Attending: Radiation Oncology | Admitting: Radiation Oncology

## 2012-09-19 ENCOUNTER — Encounter: Payer: Self-pay | Admitting: Radiation Oncology

## 2012-09-19 VITALS — BP 131/74 | HR 66 | Temp 97.3°F | Resp 20 | Wt 191.3 lb

## 2012-09-19 DIAGNOSIS — I129 Hypertensive chronic kidney disease with stage 1 through stage 4 chronic kidney disease, or unspecified chronic kidney disease: Secondary | ICD-10-CM | POA: Diagnosis not present

## 2012-09-19 DIAGNOSIS — I2510 Atherosclerotic heart disease of native coronary artery without angina pectoris: Secondary | ICD-10-CM | POA: Diagnosis not present

## 2012-09-19 DIAGNOSIS — N529 Male erectile dysfunction, unspecified: Secondary | ICD-10-CM | POA: Diagnosis not present

## 2012-09-19 DIAGNOSIS — E785 Hyperlipidemia, unspecified: Secondary | ICD-10-CM | POA: Diagnosis not present

## 2012-09-19 DIAGNOSIS — N189 Chronic kidney disease, unspecified: Secondary | ICD-10-CM | POA: Diagnosis not present

## 2012-09-19 DIAGNOSIS — C61 Malignant neoplasm of prostate: Secondary | ICD-10-CM

## 2012-09-19 DIAGNOSIS — Z510 Encounter for antineoplastic radiation therapy: Secondary | ICD-10-CM | POA: Diagnosis not present

## 2012-09-19 NOTE — Progress Notes (Signed)
Pt reports daytime urinary frequency q 1 hr, but denies nocturia. He states he feels he may not be emptying his bladder. Pt also reports his urine is "cloudy", but he states it has always been that way, that he doesn't drink enough. Pt typically drinks tea and not much between meals. Advised him to increase fluid intake, particularly water. Pt reports soft bm's, denies loss of appetite, fatigue. Pt completes tomorrow; gave him FU card.

## 2012-09-19 NOTE — Progress Notes (Signed)
Weekly Management Note:  Site: Prostate Current Dose:   7605  cGy Projected Dose: 7800  cGy  Narrative: The patient is seen today for routine under treatment assessment. CBCT/MVCT images/port films were reviewed. The chart was reviewed.   Bladder filling is satisfactory today. No new GU or GI difficulties. He still has urinary frequency every one to 2 hours, but no nocturia. He may not be emptying his bladder. He sees Dr. Diona Fanti tomorrow. He is on terazosin, and I not sure of Dr. Diona Fanti wants to increase this or start him on another alpha-blocker, or simply wait for improvement within a few weeks after completion of radiation therapy.  Physical Examination:  Filed Vitals:   09/19/12 1023  BP: 131/74  Pulse: 66  Temp: 97.3 F (36.3 C)  Resp: 20  .  Weight: 191 lb 4.8 oz (86.773 kg). No change.  Impression: Tolerating radiation therapy well.  Plan: Continue radiation therapy as planned. He'll finish his radiation therapy tomorrow. One-month followup appointment has been scheduled.

## 2012-09-20 ENCOUNTER — Ambulatory Visit
Admission: RE | Admit: 2012-09-20 | Discharge: 2012-09-20 | Disposition: A | Discharge status: Home / Self Care | Payer: Medicare Other | Source: Ambulatory Visit | Attending: Radiation Oncology | Admitting: Radiation Oncology

## 2012-09-20 ENCOUNTER — Encounter: Payer: Self-pay | Admitting: Radiation Oncology

## 2012-09-20 DIAGNOSIS — N529 Male erectile dysfunction, unspecified: Secondary | ICD-10-CM | POA: Diagnosis not present

## 2012-09-20 DIAGNOSIS — I2510 Atherosclerotic heart disease of native coronary artery without angina pectoris: Secondary | ICD-10-CM | POA: Diagnosis not present

## 2012-09-20 DIAGNOSIS — C61 Malignant neoplasm of prostate: Secondary | ICD-10-CM | POA: Diagnosis not present

## 2012-09-20 DIAGNOSIS — Z510 Encounter for antineoplastic radiation therapy: Secondary | ICD-10-CM | POA: Diagnosis not present

## 2012-09-20 DIAGNOSIS — N189 Chronic kidney disease, unspecified: Secondary | ICD-10-CM | POA: Diagnosis not present

## 2012-09-20 DIAGNOSIS — I129 Hypertensive chronic kidney disease with stage 1 through stage 4 chronic kidney disease, or unspecified chronic kidney disease: Secondary | ICD-10-CM | POA: Diagnosis not present

## 2012-09-20 DIAGNOSIS — E785 Hyperlipidemia, unspecified: Secondary | ICD-10-CM | POA: Diagnosis not present

## 2012-09-20 NOTE — Progress Notes (Signed)
[facility] End of Treatment Note  Name:Zollie C Defibaugh  Date: 09/20/2012 [id number] DOB:Jan 04, 1932   Status:outpatient    CC: Dr. Franchot Gallo , Dr. Nehemiah Settle, Dr. Daneen Schick  REFERRING MD:  Dr. Franchot Gallo    DIAGNOSIS:  Stage T2 C. intermediate to high-risk adenocarcinoma prostate  INDICATION FOR TREATMENT: Curative   TREATMENT DATES: 07/25/2012 2 09/20/2002 to                          SITE/DOSE:  Prostate 7800 cGy 40 sessions, seminal vesicles 5600 cGy 40 sessions                          BEAMS/ENERGY:  6 MV photons dual ARC VMAT IMRT                 NARRATIVE:  Mr. Edison Pace tolerated treatment well although he had worsening urinary frequency during the latter half of his treatment was suspected incomplete emptying of his bladder. He is on terazosin, presumably for his hypertension, but I did not increase this, expecting his symptoms to improve in the next few weeks.                       PLAN: Routine followup in one month. Patient instructed to call if questions or worsening complaints in interim.

## 2012-09-21 DIAGNOSIS — C61 Malignant neoplasm of prostate: Secondary | ICD-10-CM | POA: Diagnosis not present

## 2012-09-21 DIAGNOSIS — Q539 Undescended testicle, unspecified: Secondary | ICD-10-CM | POA: Diagnosis not present

## 2012-09-21 DIAGNOSIS — N320 Bladder-neck obstruction: Secondary | ICD-10-CM | POA: Diagnosis not present

## 2012-10-13 ENCOUNTER — Encounter: Payer: Self-pay | Admitting: Oncology

## 2012-10-13 DIAGNOSIS — Z9889 Other specified postprocedural states: Secondary | ICD-10-CM | POA: Insufficient documentation

## 2012-10-13 DIAGNOSIS — Z923 Personal history of irradiation: Secondary | ICD-10-CM | POA: Insufficient documentation

## 2012-10-18 ENCOUNTER — Encounter: Payer: Self-pay | Admitting: Radiation Oncology

## 2012-10-18 ENCOUNTER — Ambulatory Visit
Admission: RE | Admit: 2012-10-18 | Discharge: 2012-10-18 | Disposition: A | Discharge status: Home / Self Care | Payer: Medicare Other | Source: Ambulatory Visit | Attending: Radiation Oncology | Admitting: Radiation Oncology

## 2012-10-18 VITALS — BP 126/70 | HR 83 | Temp 97.8°F | Ht 70.0 in | Wt 193.2 lb

## 2012-10-18 DIAGNOSIS — C61 Malignant neoplasm of prostate: Secondary | ICD-10-CM

## 2012-10-18 NOTE — Progress Notes (Signed)
CC Dr. Franchot Gallo  Followup note: Mr. 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 He believes that his urinary habits are back to baseline. No GI difficulties. He does have urinary frequency in the morning secondary to his diuretic. He saw Dr. Diona Fanti just after completion of ration therapy and he will see Dr. Diona Fanti later this spring.  Physical examination: Alert and oriented. Wt Readings from Last 3 Encounters:  10/18/12 193 lb 3.2 oz (87.635 kg)  09/19/12 191 lb 4.8 oz (86.773 kg)  09/12/12 192 lb 3.2 oz (87.181 kg)   Temp Readings from Last 3 Encounters:  10/18/12 97.8 F (36.6 C)   09/19/12 97.3 F (36.3 C) Oral  09/12/12 98.4 F (36.9 C) Oral   BP Readings from Last 3 Encounters:  10/18/12 126/70  09/19/12 131/74  09/12/12 156/86   Pulse Readings from Last 3 Encounters:  10/18/12 83  09/19/12 66  09/12/12 84   Rectal examination not performed today.  Impression: Satisfactory progress.  Plan: He'll maintain his followup with Dr. Diona Fanti who will be obtaining his PSA determinations. I ask that Dr. Diona Fanti keep me posted on his progress.

## 2012-10-18 NOTE — Progress Notes (Signed)
Mr. Toyama here for 1 month follow up visit.  He denies pain.  He denies frequency of urination and does not have nocturia.  He does have trouble starting to urinate and emptying his bladder fully.  He states that he does have some rectal irritation and occasionally has diarrhea.

## 2012-10-19 ENCOUNTER — Ambulatory Visit: Payer: Medicare Other | Admitting: Radiation Oncology

## 2012-11-30 DIAGNOSIS — I4891 Unspecified atrial fibrillation: Secondary | ICD-10-CM | POA: Diagnosis not present

## 2012-11-30 DIAGNOSIS — Z7901 Long term (current) use of anticoagulants: Secondary | ICD-10-CM | POA: Diagnosis not present

## 2012-12-07 DIAGNOSIS — I495 Sick sinus syndrome: Secondary | ICD-10-CM | POA: Diagnosis not present

## 2012-12-07 DIAGNOSIS — Z950 Presence of cardiac pacemaker: Secondary | ICD-10-CM | POA: Diagnosis not present

## 2012-12-08 DIAGNOSIS — L821 Other seborrheic keratosis: Secondary | ICD-10-CM | POA: Diagnosis not present

## 2012-12-08 DIAGNOSIS — L819 Disorder of pigmentation, unspecified: Secondary | ICD-10-CM | POA: Diagnosis not present

## 2012-12-08 DIAGNOSIS — L570 Actinic keratosis: Secondary | ICD-10-CM | POA: Diagnosis not present

## 2012-12-21 DIAGNOSIS — C61 Malignant neoplasm of prostate: Secondary | ICD-10-CM | POA: Diagnosis not present

## 2012-12-21 DIAGNOSIS — N320 Bladder-neck obstruction: Secondary | ICD-10-CM | POA: Diagnosis not present

## 2013-01-10 DIAGNOSIS — I10 Essential (primary) hypertension: Secondary | ICD-10-CM | POA: Diagnosis not present

## 2013-01-10 DIAGNOSIS — I5030 Unspecified diastolic (congestive) heart failure: Secondary | ICD-10-CM | POA: Diagnosis not present

## 2013-01-10 DIAGNOSIS — I252 Old myocardial infarction: Secondary | ICD-10-CM | POA: Diagnosis not present

## 2013-01-10 DIAGNOSIS — Z1331 Encounter for screening for depression: Secondary | ICD-10-CM | POA: Diagnosis not present

## 2013-01-10 DIAGNOSIS — Z0000 Encounter for general adult medical examination without abnormal findings: Secondary | ICD-10-CM | POA: Diagnosis not present

## 2013-01-10 DIAGNOSIS — I4891 Unspecified atrial fibrillation: Secondary | ICD-10-CM | POA: Diagnosis not present

## 2013-01-10 DIAGNOSIS — N183 Chronic kidney disease, stage 3 unspecified: Secondary | ICD-10-CM | POA: Diagnosis not present

## 2013-01-10 DIAGNOSIS — I2510 Atherosclerotic heart disease of native coronary artery without angina pectoris: Secondary | ICD-10-CM | POA: Diagnosis not present

## 2013-01-10 DIAGNOSIS — Z7901 Long term (current) use of anticoagulants: Secondary | ICD-10-CM | POA: Diagnosis not present

## 2013-01-12 DIAGNOSIS — H409 Unspecified glaucoma: Secondary | ICD-10-CM | POA: Diagnosis not present

## 2013-01-12 DIAGNOSIS — H401290 Low-tension glaucoma, unspecified eye, stage unspecified: Secondary | ICD-10-CM | POA: Diagnosis not present

## 2013-01-12 DIAGNOSIS — I10 Essential (primary) hypertension: Secondary | ICD-10-CM | POA: Diagnosis not present

## 2013-02-21 DIAGNOSIS — I4891 Unspecified atrial fibrillation: Secondary | ICD-10-CM | POA: Diagnosis not present

## 2013-02-21 DIAGNOSIS — Z7901 Long term (current) use of anticoagulants: Secondary | ICD-10-CM | POA: Diagnosis not present

## 2013-03-20 DIAGNOSIS — C61 Malignant neoplasm of prostate: Secondary | ICD-10-CM | POA: Diagnosis not present

## 2013-03-24 DIAGNOSIS — Z7901 Long term (current) use of anticoagulants: Secondary | ICD-10-CM | POA: Diagnosis not present

## 2013-03-24 DIAGNOSIS — I4891 Unspecified atrial fibrillation: Secondary | ICD-10-CM | POA: Diagnosis not present

## 2013-03-24 DIAGNOSIS — Z950 Presence of cardiac pacemaker: Secondary | ICD-10-CM | POA: Diagnosis not present

## 2013-03-24 DIAGNOSIS — I495 Sick sinus syndrome: Secondary | ICD-10-CM | POA: Diagnosis not present

## 2013-03-27 DIAGNOSIS — C61 Malignant neoplasm of prostate: Secondary | ICD-10-CM | POA: Diagnosis not present

## 2013-05-02 DIAGNOSIS — Z7901 Long term (current) use of anticoagulants: Secondary | ICD-10-CM | POA: Diagnosis not present

## 2013-05-02 DIAGNOSIS — I4891 Unspecified atrial fibrillation: Secondary | ICD-10-CM | POA: Diagnosis not present

## 2013-05-09 ENCOUNTER — Other Ambulatory Visit: Payer: Self-pay | Admitting: Interventional Cardiology

## 2013-05-09 DIAGNOSIS — E782 Mixed hyperlipidemia: Secondary | ICD-10-CM

## 2013-05-09 DIAGNOSIS — E7800 Pure hypercholesterolemia, unspecified: Secondary | ICD-10-CM

## 2013-05-11 DIAGNOSIS — I10 Essential (primary) hypertension: Secondary | ICD-10-CM | POA: Diagnosis not present

## 2013-05-11 DIAGNOSIS — I252 Old myocardial infarction: Secondary | ICD-10-CM | POA: Diagnosis not present

## 2013-05-11 DIAGNOSIS — M79609 Pain in unspecified limb: Secondary | ICD-10-CM | POA: Diagnosis not present

## 2013-05-17 DIAGNOSIS — H401290 Low-tension glaucoma, unspecified eye, stage unspecified: Secondary | ICD-10-CM | POA: Diagnosis not present

## 2013-05-17 DIAGNOSIS — H409 Unspecified glaucoma: Secondary | ICD-10-CM | POA: Diagnosis not present

## 2013-05-17 DIAGNOSIS — M79609 Pain in unspecified limb: Secondary | ICD-10-CM | POA: Diagnosis not present

## 2013-05-20 ENCOUNTER — Other Ambulatory Visit: Payer: Self-pay | Admitting: Interventional Cardiology

## 2013-05-20 DIAGNOSIS — E7800 Pure hypercholesterolemia, unspecified: Secondary | ICD-10-CM

## 2013-05-20 DIAGNOSIS — E782 Mixed hyperlipidemia: Secondary | ICD-10-CM

## 2013-06-07 ENCOUNTER — Other Ambulatory Visit: Payer: Self-pay | Admitting: Gastroenterology

## 2013-06-07 DIAGNOSIS — Z86010 Personal history of colonic polyps: Secondary | ICD-10-CM

## 2013-06-07 DIAGNOSIS — Z860101 Personal history of adenomatous and serrated colon polyps: Secondary | ICD-10-CM

## 2013-06-07 HISTORY — DX: Personal history of colonic polyps: Z86.010

## 2013-06-07 HISTORY — DX: Personal history of adenomatous and serrated colon polyps: Z86.0101

## 2013-06-08 ENCOUNTER — Encounter (HOSPITAL_COMMUNITY): Payer: Self-pay | Admitting: Pharmacy Technician

## 2013-06-08 DIAGNOSIS — Z23 Encounter for immunization: Secondary | ICD-10-CM | POA: Diagnosis not present

## 2013-06-09 ENCOUNTER — Other Ambulatory Visit: Payer: Medicare Other

## 2013-06-13 ENCOUNTER — Encounter (HOSPITAL_COMMUNITY): Payer: Self-pay | Admitting: *Deleted

## 2013-06-14 ENCOUNTER — Other Ambulatory Visit (INDEPENDENT_AMBULATORY_CARE_PROVIDER_SITE_OTHER): Payer: Medicare Other

## 2013-06-14 ENCOUNTER — Ambulatory Visit (INDEPENDENT_AMBULATORY_CARE_PROVIDER_SITE_OTHER): Payer: Medicare Other | Admitting: Pharmacist

## 2013-06-14 DIAGNOSIS — E7800 Pure hypercholesterolemia, unspecified: Secondary | ICD-10-CM | POA: Diagnosis not present

## 2013-06-14 DIAGNOSIS — I4891 Unspecified atrial fibrillation: Secondary | ICD-10-CM | POA: Diagnosis not present

## 2013-06-14 DIAGNOSIS — E782 Mixed hyperlipidemia: Secondary | ICD-10-CM | POA: Diagnosis not present

## 2013-06-14 LAB — POCT INR: INR: 2.4

## 2013-06-14 LAB — LIPID PANEL
Cholesterol: 161 mg/dL (ref 0–200)
HDL: 34.7 mg/dL — ABNORMAL LOW (ref >39.00)
Total CHOL/HDL Ratio: 5
Triglycerides: 250 mg/dL — ABNORMAL HIGH (ref 0.0–149.0)
VLDL: 50 mg/dL — ABNORMAL HIGH (ref 0.0–40.0)

## 2013-06-14 LAB — LDL CHOLESTEROL, DIRECT: Direct LDL: 83.5 mg/dL

## 2013-06-14 LAB — ALT: ALT: 13 U/L (ref 0–53)

## 2013-06-15 ENCOUNTER — Telehealth: Payer: Self-pay

## 2013-06-15 NOTE — Telephone Encounter (Signed)
adv pt of lab results and to have them repeated in 69yr with Dr.Osborne(pcp).will forward labs to pt pcp.pt verbalized understanding.

## 2013-06-15 NOTE — Telephone Encounter (Signed)
Message copied by Lamar Laundry on Thu Jun 15, 2013  3:05 PM ------      Message from: Daneen Schick      Created: Thu Jun 15, 2013  1:53 PM       Profile is acceptable and should be repeated in 1 year either by me or his PCP(preferrable) ------

## 2013-06-21 DIAGNOSIS — H401290 Low-tension glaucoma, unspecified eye, stage unspecified: Secondary | ICD-10-CM | POA: Diagnosis not present

## 2013-06-21 DIAGNOSIS — H409 Unspecified glaucoma: Secondary | ICD-10-CM | POA: Diagnosis not present

## 2013-06-27 ENCOUNTER — Encounter (HOSPITAL_COMMUNITY)
Admission: RE | Disposition: A | Discharge status: Home / Self Care | Payer: Self-pay | Source: Ambulatory Visit | Attending: Gastroenterology

## 2013-06-27 ENCOUNTER — Encounter (HOSPITAL_COMMUNITY): Payer: Medicare Other | Admitting: Anesthesiology

## 2013-06-27 ENCOUNTER — Ambulatory Visit (HOSPITAL_COMMUNITY)
Admission: RE | Admit: 2013-06-27 | Discharge: 2013-06-27 | Disposition: A | Discharge status: Home / Self Care | Payer: Medicare Other | Source: Ambulatory Visit | Attending: Gastroenterology | Admitting: Gastroenterology

## 2013-06-27 ENCOUNTER — Encounter (HOSPITAL_COMMUNITY): Payer: Self-pay

## 2013-06-27 ENCOUNTER — Ambulatory Visit (HOSPITAL_COMMUNITY): Payer: Medicare Other | Admitting: Anesthesiology

## 2013-06-27 DIAGNOSIS — I2510 Atherosclerotic heart disease of native coronary artery without angina pectoris: Secondary | ICD-10-CM | POA: Insufficient documentation

## 2013-06-27 DIAGNOSIS — Z951 Presence of aortocoronary bypass graft: Secondary | ICD-10-CM | POA: Insufficient documentation

## 2013-06-27 DIAGNOSIS — D128 Benign neoplasm of rectum: Secondary | ICD-10-CM | POA: Insufficient documentation

## 2013-06-27 DIAGNOSIS — E7800 Pure hypercholesterolemia, unspecified: Secondary | ICD-10-CM | POA: Insufficient documentation

## 2013-06-27 DIAGNOSIS — I4891 Unspecified atrial fibrillation: Secondary | ICD-10-CM | POA: Diagnosis not present

## 2013-06-27 DIAGNOSIS — K219 Gastro-esophageal reflux disease without esophagitis: Secondary | ICD-10-CM | POA: Insufficient documentation

## 2013-06-27 DIAGNOSIS — Z8546 Personal history of malignant neoplasm of prostate: Secondary | ICD-10-CM | POA: Insufficient documentation

## 2013-06-27 DIAGNOSIS — I10 Essential (primary) hypertension: Secondary | ICD-10-CM | POA: Diagnosis not present

## 2013-06-27 DIAGNOSIS — I252 Old myocardial infarction: Secondary | ICD-10-CM | POA: Insufficient documentation

## 2013-06-27 DIAGNOSIS — Z1211 Encounter for screening for malignant neoplasm of colon: Secondary | ICD-10-CM | POA: Insufficient documentation

## 2013-06-27 DIAGNOSIS — Z86010 Personal history of colonic polyps: Secondary | ICD-10-CM | POA: Diagnosis not present

## 2013-06-27 DIAGNOSIS — Z950 Presence of cardiac pacemaker: Secondary | ICD-10-CM | POA: Diagnosis not present

## 2013-06-27 DIAGNOSIS — D126 Benign neoplasm of colon, unspecified: Secondary | ICD-10-CM | POA: Diagnosis not present

## 2013-06-27 DIAGNOSIS — K5730 Diverticulosis of large intestine without perforation or abscess without bleeding: Secondary | ICD-10-CM | POA: Diagnosis not present

## 2013-06-27 HISTORY — PX: COLONOSCOPY WITH PROPOFOL: SHX5780

## 2013-06-27 SURGERY — COLONOSCOPY WITH PROPOFOL
Anesthesia: Monitor Anesthesia Care

## 2013-06-27 MED ORDER — PROPOFOL 10 MG/ML IV BOLUS
INTRAVENOUS | Status: DC | PRN
  Administered 2013-06-27: 50 mg via INTRAVENOUS

## 2013-06-27 MED ORDER — SODIUM CHLORIDE 0.9 % IV SOLN: INTRAVENOUS | Status: DC

## 2013-06-27 MED ORDER — MIDAZOLAM HCL 5 MG/5ML IJ SOLN
INTRAMUSCULAR | Status: DC | PRN
  Administered 2013-06-27 (×2): 1 mg via INTRAVENOUS

## 2013-06-27 MED ORDER — PROPOFOL INFUSION 10 MG/ML OPTIME
INTRAVENOUS | Status: DC | PRN
  Administered 2013-06-27: 50 ug/kg/min via INTRAVENOUS

## 2013-06-27 MED ORDER — LACTATED RINGERS IV SOLN
INTRAVENOUS | Status: DC | PRN
  Administered 2013-06-27: 08:00:00 via INTRAVENOUS

## 2013-06-27 MED ORDER — GLYCOPYRROLATE 0.2 MG/ML IJ SOLN
INTRAMUSCULAR | Status: DC | PRN
  Administered 2013-06-27: 0.2 mg via INTRAVENOUS

## 2013-06-27 MED ORDER — KETAMINE HCL 10 MG/ML IJ SOLN
INTRAMUSCULAR | Status: DC | PRN
  Administered 2013-06-27: 13 mg via INTRAVENOUS
  Administered 2013-06-27: 12 mg via INTRAVENOUS

## 2013-06-27 SURGICAL SUPPLY — 22 items

## 2013-06-27 NOTE — H&P (Signed)
  Procedure: Surveillance colonoscopy  History: The patient is a 79 year old male born may 09/11/1931. In August 2006, he underwent a colonoscopy with removal of a small adenomatous colon polyp. In April 2009, the patient underwent a normal surveillance colonoscopy.  The patient is scheduled to undergo a repeat surveillance colonoscopy today.  The patient chronically takes Coumadin anticoagulation; he stop taking Coumadin 5 days prior to today's exam.  Past medical history: Gastroesophageal reflux. Hypercholesterolemia. Prostate cancer. Osteoarthritis. Lactose intolerance. Kidney stones. Hypertension. Pacemaker placement. Coronary artery disease. Coronary artery bypass grafting. Two myocardial infarctions. Atrial fibrillation requiring cardioversion. Cataract surgery.  Allergies: Crestor causes knee and leg pain.  Exam: The patient is alert and lying comfortably on the endoscopy stretcher. Cardiac exam reveals a regular rhythm. Lungs are clear to auscultation. Abdomen is soft and nontender to palpation.  Plan: Proceed with surveillance colonoscopy

## 2013-06-27 NOTE — Preoperative (Signed)
Beta Blockers   Reason not to administer Beta Blockers:Not Applicable, took BB this am 

## 2013-06-27 NOTE — Anesthesia Preprocedure Evaluation (Signed)
Anesthesia Evaluation  Patient identified by MRN, date of birth, ID band Patient awake    Reviewed: Allergy & Precautions, H&P , NPO status , Patient's Chart, lab work & pertinent test results  Airway Mallampati: II TM Distance: >3 FB Neck ROM: full    Dental no notable dental hx.    Pulmonary neg pulmonary ROS,  breath sounds clear to auscultation  Pulmonary exam normal       Cardiovascular Exercise Tolerance: Good hypertension, Pt. on medications + CAD, + Past MI and + CABG + dysrhythmias Atrial Fibrillation + pacemaker Rhythm:regular Rate:Normal  symptomatic bradycardia. MI x 2   Neuro/Psych glaucoma negative neurological ROS  negative psych ROS   GI/Hepatic negative GI ROS, Neg liver ROS, GERD-  Medicated and Controlled,  Endo/Other  negative endocrine ROS  Renal/GU negative Renal ROS  negative genitourinary   Musculoskeletal   Abdominal   Peds  Hematology negative hematology ROS (+)   Anesthesia Other Findings   Reproductive/Obstetrics negative OB ROS                           Anesthesia Physical Anesthesia Plan  ASA: III  Anesthesia Plan: MAC   Post-op Pain Management:    Induction:   Airway Management Planned: Simple Face Mask  Additional Equipment:   Intra-op Plan:   Post-operative Plan:   Informed Consent: I have reviewed the patients History and Physical, chart, labs and discussed the procedure including the risks, benefits and alternatives for the proposed anesthesia with the patient or authorized representative who has indicated his/her understanding and acceptance.   Dental Advisory Given  Plan Discussed with: CRNA and Surgeon  Anesthesia Plan Comments:         Anesthesia Quick Evaluation

## 2013-06-27 NOTE — Op Note (Signed)
Procedure: Surveillance colonoscopy  Endoscopist: Earle Gell  Premedication: Propofol administered by anesthesia  Procedure: The patient was placed in the left lateral decubitus position. Anal inspection and digital rectal exam were normal. The Pentax pediatric colonoscope was introduced into the rectum and advanced to the cecum. A normal-appearing ileocecal valve and appendiceal orifice were identified. Colonic preparation for the exam today was good.  Rectum. From the mid rectum a 5 mm sessile polyp was removed with the cold snare. Retroflex view of the distal rectum was normal.  Sigmoid colon and descending colon. Left colonic diverticulosis  Splenic flexure. Normal  Transverse colon. Normal  Hepatic flexure. Normal  Ascending colon. From the proximal ascending colon a 5 mm sessile polyp was removed with the cold snare.  Cecum and ileocecal valve. Normal  Assessment:  #1. A 5 mm sessile polyp was removed from the rectum and a 5 mm sessile polyp was removed from the proximal ascending colon. Both polyps were submitted in one bottle for pathological evaluation.  #2. Left colonic diverticulosis.

## 2013-06-27 NOTE — Anesthesia Postprocedure Evaluation (Signed)
  Anesthesia Post-op Note  Patient: Jonathan Warner  Procedure(s) Performed: Procedure(s) (LRB): COLONOSCOPY WITH PROPOFOL (N/A)  Patient Location: PACU  Anesthesia Type: MAC  Level of Consciousness: awake and alert   Airway and Oxygen Therapy: Patient Spontanous Breathing  Post-op Pain: mild  Post-op Assessment: Post-op Vital signs reviewed, Patient's Cardiovascular Status Stable, Respiratory Function Stable, Patent Airway and No signs of Nausea or vomiting  Last Vitals:  Filed Vitals:   06/27/13 0859  BP: 116/64  Pulse: 60  Temp:   Resp: 10    Post-op Vital Signs: stable   Complications: No apparent anesthesia complications

## 2013-06-27 NOTE — Transfer of Care (Signed)
Immediate Anesthesia Transfer of Care Note  Patient: Jonathan Warner  Procedure(s) Performed: Procedure(s): COLONOSCOPY WITH PROPOFOL (N/A)  Patient Location: PACU, ENDO  Anesthesia Type:MAC  Level of Consciousness: Patient easily awoken, sedated, comfortable, cooperative, following commands, responds to stimulation.   Airway & Oxygen Therapy: Patient spontaneously breathing, ventilating well, oxygen via simple oxygen mask.  Post-op Assessment: Report given to PACU RN, vital signs reviewed and stable, moving all extremities.   Post vital signs: Reviewed and stable.  Complications: No apparent anesthesia complications

## 2013-06-28 ENCOUNTER — Encounter (HOSPITAL_COMMUNITY): Payer: Self-pay | Admitting: Gastroenterology

## 2013-07-12 ENCOUNTER — Ambulatory Visit (INDEPENDENT_AMBULATORY_CARE_PROVIDER_SITE_OTHER): Payer: Medicare Other | Admitting: Pharmacist

## 2013-07-12 DIAGNOSIS — I4891 Unspecified atrial fibrillation: Secondary | ICD-10-CM | POA: Diagnosis not present

## 2013-07-12 LAB — POCT INR: INR: 2

## 2013-07-25 DIAGNOSIS — C61 Malignant neoplasm of prostate: Secondary | ICD-10-CM | POA: Diagnosis not present

## 2013-07-28 ENCOUNTER — Encounter: Payer: Self-pay | Admitting: Internal Medicine

## 2013-07-31 ENCOUNTER — Ambulatory Visit (INDEPENDENT_AMBULATORY_CARE_PROVIDER_SITE_OTHER): Payer: Medicare Other | Admitting: Internal Medicine

## 2013-07-31 ENCOUNTER — Encounter: Payer: Self-pay | Admitting: Internal Medicine

## 2013-07-31 VITALS — BP 124/68 | HR 68 | Ht 70.0 in | Wt 194.4 lb

## 2013-07-31 DIAGNOSIS — Z950 Presence of cardiac pacemaker: Secondary | ICD-10-CM

## 2013-07-31 DIAGNOSIS — I4891 Unspecified atrial fibrillation: Secondary | ICD-10-CM | POA: Diagnosis not present

## 2013-07-31 DIAGNOSIS — C61 Malignant neoplasm of prostate: Secondary | ICD-10-CM | POA: Diagnosis not present

## 2013-07-31 DIAGNOSIS — I498 Other specified cardiac arrhythmias: Secondary | ICD-10-CM | POA: Diagnosis not present

## 2013-07-31 DIAGNOSIS — R001 Bradycardia, unspecified: Secondary | ICD-10-CM

## 2013-07-31 DIAGNOSIS — N320 Bladder-neck obstruction: Secondary | ICD-10-CM | POA: Diagnosis not present

## 2013-07-31 NOTE — Progress Notes (Signed)
PCP: Horton Finer, MD Primary Cardiologist: Pernell Dupre, MD  Jonathan Warner is a 79 y.o. male who presents today for routine electrophysiology followup. He has been followed by [facility] for pacemaker care since generator change 05/2012.  He presents today to re-establish in the device clinic.  Since last being seen in our clinic, the patient reports doing very well. He has completed a treatment of radiation for prostate cancer.  Today, he denies symptoms of palpitations, chest pain, shortness of breath,  lower extremity edema, dizziness, presyncope, or syncope.  The patient is otherwise without complaint today.   Past Medical History  Diagnosis Date  . Symptomatic bradycardia     s/p PPM by Dr Leonia Reeves 2004  . Persistent atrial fibrillation   . CAD (coronary artery disease)     s/p CABG 1991  . GERD (gastroesophageal reflux disease)   . Hyperlipidemia   . BPH (benign prostatic hyperplasia)   . DJD (degenerative joint disease)   . Diverticulosis   . Hypertension   . Prostate cancer 04/27/12    Dr. Eulogio Ditch. Radiation therapy. Adenocarcinoma,gleason=3+4=7, 3+3=6,PSA=4.93  volume=51cc  . Arthritis   . A-fib   . Inguinal hernia   . Hiatal hernia   . Heart murmur   . DJD (degenerative joint disease)   . History of cardioversion     Afib, repeat CV 04/11/2010- Successful  . Coronary atherosclerosis   . Chronic kidney disease (CKD), stage III (moderate)   . 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 History of shingles   . Glaucoma   . Cataract     b/l surgery  . History of radiation therapy 07/25/2012-09/20/2012    prostate 7800 cGy 40 sessions, seminal vesicles 5600 cGy 40 sessions  . Heart attack     acute at least 2  . Pulmonary nodules     small rlung base nodule 4.67mm, scarring in lung bases  . Hypercholesteremia   . Congestive heart failure with left ventricular diastolic dysfunction, NYHA class 2   . History of adenomatous polyp of colon 06/07/13     Last colonoscopy 06/27/13 showed small adenoma, no further testing due to advanced age.  . Claudication   . Lactose intolerance   . Kidney stones   . Absent testis     Left, at birth  . Tachy-brady syndrome     With DDD pacemaker-Dr. [name], gen change out 05/2012 per [name].  . Osteomyelitis of forearm, right, acute     drainage x 2   Past Surgical History  Procedure Laterality Date  . Pacemaker insertion  05/14/03    MDT EnPulse implanted by Dr Leonia Reeves ddd  . Prostate biopsy  04/27/12    Adenocarcinoma,gleason=3+3=6, 3+4,& 4+3,PSA=4.93,volume=51cc  . Colonoscopy  12/21/2007    hx polyps, diverticulosis  . Coronary artery bypass graft  1991    x5; prior MI  . Colonoscopy with propofol N/A 06/27/2013    Procedure: COLONOSCOPY WITH PROPOFOL;  Surgeon: Garlan Fair, MD;  Location: [facility] END[facility]COPY;  Service: Endoscopy;  Laterality: N/A;  . Tumor removal      skin tumors removed  . Cataract extraction w/ intraocular lens implant  2011  . Pacemaker generator change  05/24/12  . Cardiac catheterization  06/04/09    left w/noted patent bypass grafts    Current Outpatient Prescriptions  Medication Sig Dispense Refill  . acetaminophen (TYLENOL) 500 MG tablet Take 1,000 mg by mouth every 6 (six) hours as needed for pain.       [name]  amLODipine (NORVASC) 5 MG tablet Take 5 mg by mouth every morning.       . furosemide (LASIX) 40 MG tablet Take 60 mg by mouth daily.      [name] latanoprost (XALATAN) 0.005 % ophthalmic solution Place 1 drop into both eyes at bedtime.       . Multiple Vitamin (MULTIVITAMIN) tablet Take 1 tablet by mouth daily.      . nitroGLYCERIN (NITROSTAT) 0.4 MG SL tablet Place 0.4 mg under the tongue every 5 (five) minutes as needed. For chest pain.      [name] omeprazole (PRILOSEC) 20 MG capsule Take 20 mg by mouth daily.      . potassium chloride SA (K-DUR,KLOR-CON) 20 MEQ tablet Take 30 mEq by mouth daily. Takes 1 and 1/2      . sotalol (BETAPACE) 160 MG tablet Take 160 mg by  mouth 2 (two) times daily.       [name] terazosin (HYTRIN) 5 MG capsule Take 5 mg by mouth at bedtime.      . vitamin E 400 UNIT capsule Take 400 Units by mouth daily.      [name] warfarin (COUMADIN) 2.5 MG tablet Take as directed by the coumadin clinic       No current facility-administered medications for this visit.    Physical Exam: Filed Vitals:   07/31/13 1529  Height: 5\' 10"  (1.778 m)  Weight: 194 lb 6.4 oz (88.179 kg)    GEN- The patient is well appearing, alert and oriented x 3 today.   Head- normocephalic, atraumatic Eyes-  Sclera clear, conjunctiva pink Ears- hearing intact Oropharynx- clear Lungs- Clear to ausculation bilaterally, normal work of breathing Chest- pacemaker pocket is well healed Heart- Regular rate and rhythm, no murmurs, rubs or gallops, PMI not laterally displaced GI- soft, NT, ND, + BS Extremities- no clubbing, cyanosis, or edema  Pacemaker interrogation- reviewed in detail today,  See PACEART report ekg today reveals atrial pacing PR 254, RBBB, Qtc 448  Assessment and Plan:  1. Sick sinus syndrome Normal pacemaker function See Pace Art report No changes today  2. afib Maintaining sinus with sotalol Continue coumadin Check bmet on sotalol today  carelink Return to see Ileene Hutchinson in a year Followup with Dr [name] as scheduled

## 2013-07-31 NOTE — Patient Instructions (Addendum)
Remote monitoring is used to monitor your Pacemaker of ICD from home. This monitoring reduces the number of office visits required to check your device to one time per year. It allows [place] to keep an eye on the functioning of your device to ensure it is working properly. You are scheduled for a device check from home on November 01, 2013. You may send your transmission at any time that day. If you have a wireless device, the transmission will be sent automatically. After your physician reviews your transmission, you will receive a postcard with your next transmission date.  Your physician wants you to follow-up in: 1 year with Andi Hence.  You will receive a reminder letter in the mail two months in advance. If you don't receive a letter, please call our office to schedule the follow-up appointment.   Will get labs today:BMP/MAG

## 2013-08-01 LAB — BASIC METABOLIC PANEL
BUN: 19 mg/dL (ref 6–23)
CO2: 31 mEq/L (ref 19–32)
Calcium: 9.1 mg/dL (ref 8.4–10.5)
Chloride: 100 mEq/L (ref 96–112)
Creatinine, Ser: 1.2 mg/dL (ref 0.4–1.5)
GFR: 63.19 mL/min (ref >60.00)
Glucose, Bld: 92 mg/dL (ref 70–99)
Potassium: 4.3 mEq/L (ref 3.5–5.1)
Sodium: 138 mEq/L (ref 135–145)

## 2013-08-01 LAB — MAGNESIUM: Magnesium: 2 mg/dL (ref 1.5–2.5)

## 2013-08-07 LAB — MDC_IDC_ENUM_SESS_TYPE_INCLINIC
Battery Voltage: 2.79 V
Brady Statistic AP VP Percent: 10.4 %
Brady Statistic AP VS Percent: 87.7 %
Brady Statistic AS VP Percent: 0.1 %
Brady Statistic AS VS Percent: 1.8 %
Lead Channel Pacing Threshold Amplitude: 0.75 V
Lead Channel Pacing Threshold Amplitude: 1.375 V
Lead Channel Pacing Threshold Pulse Width: 0.4 ms
Lead Channel Pacing Threshold Pulse Width: 0.4 ms
Lead Channel Sensing Intrinsic Amplitude: 1.4 mV
Lead Channel Sensing Intrinsic Amplitude: 16 mV
Lead Channel Setting Pacing Amplitude: 2 V
Lead Channel Setting Pacing Amplitude: 2.5 V
Lead Channel Setting Pacing Pulse Width: 0.4 ms
Lead Channel Setting Sensing Sensitivity: 2.8 mV

## 2013-08-08 DIAGNOSIS — B029 Zoster without complications: Secondary | ICD-10-CM | POA: Diagnosis not present

## 2013-08-09 ENCOUNTER — Ambulatory Visit (INDEPENDENT_AMBULATORY_CARE_PROVIDER_SITE_OTHER): Payer: Medicare Other | Admitting: *Deleted

## 2013-08-09 DIAGNOSIS — I4891 Unspecified atrial fibrillation: Secondary | ICD-10-CM | POA: Diagnosis not present

## 2013-08-09 LAB — POCT INR: INR: 1.3

## 2013-08-11 ENCOUNTER — Other Ambulatory Visit: Payer: Self-pay | Admitting: *Deleted

## 2013-08-11 MED ORDER — POTASSIUM CHLORIDE CRYS ER 20 MEQ PO TBCR: 30.0000 meq | EXTENDED_RELEASE_TABLET | Freq: Every day | ORAL | Status: DC

## 2013-08-15 DIAGNOSIS — B029 Zoster without complications: Secondary | ICD-10-CM | POA: Diagnosis not present

## 2013-08-23 ENCOUNTER — Ambulatory Visit (INDEPENDENT_AMBULATORY_CARE_PROVIDER_SITE_OTHER): Payer: Medicare Other | Admitting: Pharmacist

## 2013-08-23 DIAGNOSIS — I4891 Unspecified atrial fibrillation: Secondary | ICD-10-CM | POA: Diagnosis not present

## 2013-08-23 LAB — POCT INR: INR: 2.5

## 2013-09-07 ENCOUNTER — Encounter: Payer: Self-pay | Admitting: Internal Medicine

## 2013-09-18 DIAGNOSIS — H409 Unspecified glaucoma: Secondary | ICD-10-CM | POA: Diagnosis not present

## 2013-09-18 DIAGNOSIS — H401290 Low-tension glaucoma, unspecified eye, stage unspecified: Secondary | ICD-10-CM | POA: Diagnosis not present

## 2013-09-19 DIAGNOSIS — I10 Essential (primary) hypertension: Secondary | ICD-10-CM | POA: Diagnosis not present

## 2013-09-19 DIAGNOSIS — N183 Chronic kidney disease, stage 3 unspecified: Secondary | ICD-10-CM | POA: Diagnosis not present

## 2013-09-19 DIAGNOSIS — I252 Old myocardial infarction: Secondary | ICD-10-CM | POA: Diagnosis not present

## 2013-09-20 ENCOUNTER — Ambulatory Visit (INDEPENDENT_AMBULATORY_CARE_PROVIDER_SITE_OTHER): Payer: Medicare Other | Admitting: *Deleted

## 2013-09-20 DIAGNOSIS — Z5181 Encounter for therapeutic drug level monitoring: Secondary | ICD-10-CM

## 2013-09-20 DIAGNOSIS — I4891 Unspecified atrial fibrillation: Secondary | ICD-10-CM

## 2013-09-20 DIAGNOSIS — Z7189 Other specified counseling: Secondary | ICD-10-CM | POA: Insufficient documentation

## 2013-09-20 LAB — POCT INR: INR: 2.2

## 2013-09-21 ENCOUNTER — Other Ambulatory Visit: Payer: Self-pay

## 2013-09-21 MED ORDER — FUROSEMIDE 40 MG PO TABS: 60.0000 mg | ORAL_TABLET | Freq: Every day | ORAL | Status: DC

## 2013-09-21 MED ORDER — AMLODIPINE BESYLATE 5 MG PO TABS: 5.0000 mg | ORAL_TABLET | Freq: Every morning | ORAL | Status: DC

## 2013-10-12 ENCOUNTER — Encounter: Payer: Self-pay | Admitting: Interventional Cardiology

## 2013-10-18 ENCOUNTER — Ambulatory Visit: Payer: Medicare Other | Admitting: Interventional Cardiology

## 2013-10-25 ENCOUNTER — Ambulatory Visit (INDEPENDENT_AMBULATORY_CARE_PROVIDER_SITE_OTHER): Payer: Medicare Other | Admitting: *Deleted

## 2013-10-25 DIAGNOSIS — Z5181 Encounter for therapeutic drug level monitoring: Secondary | ICD-10-CM | POA: Diagnosis not present

## 2013-10-25 DIAGNOSIS — I4891 Unspecified atrial fibrillation: Secondary | ICD-10-CM

## 2013-10-25 LAB — POCT INR: INR: 1.8

## 2013-11-01 ENCOUNTER — Ambulatory Visit (INDEPENDENT_AMBULATORY_CARE_PROVIDER_SITE_OTHER): Payer: Medicare Other | Admitting: *Deleted

## 2013-11-01 ENCOUNTER — Encounter: Payer: Self-pay | Admitting: Internal Medicine

## 2013-11-01 DIAGNOSIS — I498 Other specified cardiac arrhythmias: Secondary | ICD-10-CM | POA: Diagnosis not present

## 2013-11-01 DIAGNOSIS — I4891 Unspecified atrial fibrillation: Secondary | ICD-10-CM

## 2013-11-01 DIAGNOSIS — R001 Bradycardia, unspecified: Secondary | ICD-10-CM

## 2013-11-01 LAB — MDC_IDC_ENUM_SESS_TYPE_REMOTE
Battery Voltage: 2.79 V
Brady Statistic AP VP Percent: 14 %
Brady Statistic AP VS Percent: 83.4 %
Brady Statistic AS VP Percent: 0.2 %
Brady Statistic AS VS Percent: 2.4 %
Lead Channel Impedance Value: 495 Ohm
Lead Channel Impedance Value: 635 Ohm
Lead Channel Pacing Threshold Amplitude: 0.75 V
Lead Channel Pacing Threshold Amplitude: 1.375 V
Lead Channel Pacing Threshold Pulse Width: 0.4 ms
Lead Channel Pacing Threshold Pulse Width: 0.4 ms
Lead Channel Sensing Intrinsic Amplitude: 8 mV
Lead Channel Setting Pacing Amplitude: 2 V
Lead Channel Setting Pacing Amplitude: 2.5 V
Lead Channel Setting Pacing Pulse Width: 0.4 ms
Lead Channel Setting Sensing Sensitivity: 2.8 mV

## 2013-11-07 ENCOUNTER — Other Ambulatory Visit: Payer: Self-pay

## 2013-11-07 ENCOUNTER — Other Ambulatory Visit: Payer: Self-pay | Admitting: Interventional Cardiology

## 2013-11-07 ENCOUNTER — Encounter: Payer: Self-pay | Admitting: Internal Medicine

## 2013-11-07 MED ORDER — SOTALOL HCL 160 MG PO TABS: 160.0000 mg | ORAL_TABLET | Freq: Two times a day (BID) | ORAL | Status: DC

## 2013-11-22 ENCOUNTER — Encounter: Payer: Self-pay | Admitting: *Deleted

## 2013-11-27 ENCOUNTER — Ambulatory Visit (INDEPENDENT_AMBULATORY_CARE_PROVIDER_SITE_OTHER): Payer: Medicare Other | Admitting: Pharmacist

## 2013-11-27 DIAGNOSIS — Z5181 Encounter for therapeutic drug level monitoring: Secondary | ICD-10-CM

## 2013-11-27 DIAGNOSIS — I4891 Unspecified atrial fibrillation: Secondary | ICD-10-CM

## 2013-11-27 LAB — POCT INR: INR: 1.8

## 2013-12-01 ENCOUNTER — Encounter: Payer: Self-pay | Admitting: Interventional Cardiology

## 2013-12-01 ENCOUNTER — Ambulatory Visit (INDEPENDENT_AMBULATORY_CARE_PROVIDER_SITE_OTHER): Payer: Medicare Other | Admitting: Interventional Cardiology

## 2013-12-01 VITALS — BP 102/60 | HR 90 | Ht 70.0 in | Wt 188.0 lb

## 2013-12-01 DIAGNOSIS — I25810 Atherosclerosis of coronary artery bypass graft(s) without angina pectoris: Secondary | ICD-10-CM

## 2013-12-01 DIAGNOSIS — I498 Other specified cardiac arrhythmias: Secondary | ICD-10-CM

## 2013-12-01 DIAGNOSIS — I4891 Unspecified atrial fibrillation: Secondary | ICD-10-CM | POA: Diagnosis not present

## 2013-12-01 DIAGNOSIS — Z5181 Encounter for therapeutic drug level monitoring: Secondary | ICD-10-CM | POA: Diagnosis not present

## 2013-12-01 DIAGNOSIS — Z7901 Long term (current) use of anticoagulants: Secondary | ICD-10-CM

## 2013-12-01 DIAGNOSIS — R001 Bradycardia, unspecified: Secondary | ICD-10-CM

## 2013-12-01 NOTE — Progress Notes (Signed)
Patient ID: Jonathan Warner, male   DOB: [birth year], 80 y.o.   MRN: [id number]    [address], [place]  [address] Phone: [phone] Fax:  [phone]  Date:  12/01/2013   ID:  Jonathan Warner, DOB March 10, 1932, MRN [id number]  PCP:  Horton Finer, MD   ASSESSMENT:  1.Paroxysmal atrial fibrillation, stable 2. Chronic anticoagulation therapy without complications 3. Coronary atherosclerotic heart disease, asymptomatic 4. Chronic diastolic heart failure, stable 5. Exertional lower extremity fatigue and weakness with prior vascular evaluation not demonstrating significant obstruction in 2014  PLAN:  1. No change in current medical regimen 2. Daily walking to stimulate circulation in legs 3. Call of bleeding or other cardiovascular complaints   SUBJECTIVE: Jonathan Warner is a 80 y.o. male who has not had syncope, neurological complaints, or significant bleeding. The patient has the chronic complaint of lower extremity fatigue with ambulation. This is been stable over the last several years. Lower extremity Doppler evaluation done a year ago at [facility] did not demonstrate significant obstruction.   Wt Readings from Last 3 Encounters:  12/01/13 188 lb (85.276 kg)  07/31/13 194 lb 6.4 oz (88.179 kg)  06/27/13 193 lb (87.544 kg)     Past Medical History  Diagnosis Date  . Symptomatic bradycardia     s/p PPM by Dr Leonia Reeves 2004  . Persistent atrial fibrillation   . CAD (coronary artery disease)     s/p CABG 1991  . GERD (gastroesophageal reflux disease)   . Hyperlipidemia   . BPH (benign prostatic hyperplasia)   . DJD (degenerative joint disease)   . Diverticulosis   . Hypertension   . Prostate cancer 04/27/12    Dr. Eulogio Ditch. Radiation therapy. Adenocarcinoma,gleason=3+4=7, 3+3=6,PSA=4.93  volume=51cc  . Arthritis   . A-fib   . Inguinal hernia   . Hiatal hernia   . Heart murmur   . DJD (degenerative joint disease)   . History of cardioversion     Afib,  repeat CV 04/11/2010- Successful  . Coronary atherosclerosis   . Chronic kidney disease (CKD), stage III (moderate)   . 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 History of shingles   . Glaucoma   . Cataract     b/l surgery  . History of radiation therapy 07/25/2012-09/20/2012    prostate 7800 cGy 40 sessions, seminal vesicles 5600 cGy 40 sessions  . Heart attack     acute at least 2  . Pulmonary nodules     small rlung base nodule 4.2mm, scarring in lung bases  . Hypercholesteremia   . Congestive heart failure with left ventricular diastolic dysfunction, NYHA class 2   . History of adenomatous polyp of colon 06/07/13    Last colonoscopy 06/27/13 showed small adenoma, no further testing due to advanced age.  . Claudication   . Lactose intolerance   . Kidney stones   . Absent testis     Left, at birth  . Tachy-brady syndrome     With DDD pacemaker-Dr. [name], gen change out 05/2012 per [name].  . Osteomyelitis of forearm, right, acute     drainage x 2    Current Outpatient Prescriptions  Medication Sig Dispense Refill  . acetaminophen (TYLENOL) 500 MG tablet Take 1,000 mg by mouth every 6 (six) hours as needed for pain.       [name] amLODipine (NORVASC) 5 MG tablet Take 1 tablet (5 mg total) by mouth every morning.  [place]  tablet  1  . furosemide (LASIX) 40 MG tablet Take 1.5 tablets (60 mg total) by mouth daily.  135 tablet  1  . latanoprost (XALATAN) 0.005 % ophthalmic solution Apply 1 drop to eye at bedtime.       . Multiple Vitamin (MULTIVITAMIN) tablet Take 1 tablet by mouth daily.      . nitroGLYCERIN (NITROSTAT) 0.4 MG SL tablet Place 0.4 mg under the tongue every 5 (five) minutes as needed. For chest pain.      [name] omeprazole (PRILOSEC) 20 MG capsule Take 20 mg by mouth daily.      . potassium chloride SA (K-DUR,KLOR-CON) 20 MEQ tablet TAKE 1 AND 1/2 TABLETS EVERY DAY  135 tablet  0  . sotalol (BETAPACE) 160 MG tablet Take 1 tablet (160 mg total) by mouth 2 (two) times  daily.  180 tablet  1  . terazosin (HYTRIN) 5 MG capsule Take 5 mg by mouth at bedtime.      . vitamin E 400 UNIT capsule Take 400 Units by mouth daily.      [name] warfarin (COUMADIN) 2.5 MG tablet Take as directed by the coumadin clinic       No current facility-administered medications for this visit.    Allergies:    Allergies  Allergen Reactions  . Crestor [Rosuvastatin]     Knee and leg pain    Social History:  The patient  reports that he quit smoking about 32 years ago. His smoking use included Cigarettes. He has a 5 pack-year smoking history. He has never used smokeless tobacco. He reports that he does not drink alcohol or use illicit drugs.   ROS:  Please see the history of present illness.   Appetite and weight have been stable   All other systems reviewed and negative.   OBJECTIVE: VS:  BP 102/60  Pulse 90  Ht 5\' 10"  (1.778 m)  Wt 188 lb (85.276 kg)  BMI 26.98 kg/m2 Well nourished, well developed, in no acute distress, appears his stated age 71: normal Neck: JVD flat. Carotid bruit 2+ with faint bilateral carotid bruits  Cardiac:  normal S1, S2; RRR; no murmur Lungs:  clear to auscultation bilaterally, no wheezing, rhonchi or rales Abd: soft, nontender, no hepatomegaly Ext: Edema absent. Pulses difficult/trace to palpate Skin: warm and dry Neuro:  CNs 2-12 intact, no focal abnormalities noted  EKG:  Not repeated       Signed, Illene Labrador III, MD 12/01/2013 11:13 AM

## 2013-12-01 NOTE — Patient Instructions (Signed)
Your physician recommends that you continue on your current medications as directed. Please refer to the Current Medication list given to you today.  Your physician wants you to follow-up in: 1 year with Dr.Smith You will receive a reminder letter in the mail two months in advance. If you don't receive a letter, please call our office to schedule the follow-up appointment.  

## 2013-12-07 DIAGNOSIS — L819 Disorder of pigmentation, unspecified: Secondary | ICD-10-CM | POA: Diagnosis not present

## 2013-12-07 DIAGNOSIS — L820 Inflamed seborrheic keratosis: Secondary | ICD-10-CM | POA: Diagnosis not present

## 2013-12-07 DIAGNOSIS — L821 Other seborrheic keratosis: Secondary | ICD-10-CM | POA: Diagnosis not present

## 2013-12-07 DIAGNOSIS — Z85828 Personal history of other malignant neoplasm of skin: Secondary | ICD-10-CM | POA: Diagnosis not present

## 2013-12-11 ENCOUNTER — Ambulatory Visit (INDEPENDENT_AMBULATORY_CARE_PROVIDER_SITE_OTHER): Payer: Medicare Other | Admitting: Pharmacist

## 2013-12-11 DIAGNOSIS — I4891 Unspecified atrial fibrillation: Secondary | ICD-10-CM | POA: Diagnosis not present

## 2013-12-11 DIAGNOSIS — Z5181 Encounter for therapeutic drug level monitoring: Secondary | ICD-10-CM

## 2013-12-11 LAB — POCT INR: INR: 2.4

## 2014-01-01 ENCOUNTER — Ambulatory Visit (INDEPENDENT_AMBULATORY_CARE_PROVIDER_SITE_OTHER): Payer: Medicare Other | Admitting: *Deleted

## 2014-01-01 DIAGNOSIS — I4891 Unspecified atrial fibrillation: Secondary | ICD-10-CM

## 2014-01-01 DIAGNOSIS — Z5181 Encounter for therapeutic drug level monitoring: Secondary | ICD-10-CM

## 2014-01-01 LAB — POCT INR: INR: 2

## 2014-01-17 ENCOUNTER — Encounter: Payer: Self-pay | Admitting: Interventional Cardiology

## 2014-01-17 DIAGNOSIS — Z0000 Encounter for general adult medical examination without abnormal findings: Secondary | ICD-10-CM | POA: Diagnosis not present

## 2014-01-17 DIAGNOSIS — I4891 Unspecified atrial fibrillation: Secondary | ICD-10-CM | POA: Diagnosis not present

## 2014-01-17 DIAGNOSIS — N183 Chronic kidney disease, stage 3 unspecified: Secondary | ICD-10-CM | POA: Diagnosis not present

## 2014-01-17 DIAGNOSIS — I2510 Atherosclerotic heart disease of native coronary artery without angina pectoris: Secondary | ICD-10-CM | POA: Diagnosis not present

## 2014-01-17 DIAGNOSIS — I252 Old myocardial infarction: Secondary | ICD-10-CM | POA: Diagnosis not present

## 2014-01-17 DIAGNOSIS — Z1331 Encounter for screening for depression: Secondary | ICD-10-CM | POA: Diagnosis not present

## 2014-01-17 DIAGNOSIS — I10 Essential (primary) hypertension: Secondary | ICD-10-CM | POA: Diagnosis not present

## 2014-01-17 DIAGNOSIS — I5030 Unspecified diastolic (congestive) heart failure: Secondary | ICD-10-CM | POA: Diagnosis not present

## 2014-01-17 DIAGNOSIS — Z23 Encounter for immunization: Secondary | ICD-10-CM | POA: Diagnosis not present

## 2014-01-22 DIAGNOSIS — H401290 Low-tension glaucoma, unspecified eye, stage unspecified: Secondary | ICD-10-CM | POA: Diagnosis not present

## 2014-01-22 DIAGNOSIS — H409 Unspecified glaucoma: Secondary | ICD-10-CM | POA: Diagnosis not present

## 2014-01-23 ENCOUNTER — Ambulatory Visit (INDEPENDENT_AMBULATORY_CARE_PROVIDER_SITE_OTHER): Payer: Medicare Other | Admitting: Pharmacist Clinician (PhC)/ Clinical Pharmacy Specialist

## 2014-01-23 DIAGNOSIS — I4891 Unspecified atrial fibrillation: Secondary | ICD-10-CM

## 2014-01-23 DIAGNOSIS — Z5181 Encounter for therapeutic drug level monitoring: Secondary | ICD-10-CM | POA: Diagnosis not present

## 2014-01-23 LAB — POCT INR: INR: 2.1

## 2014-02-05 ENCOUNTER — Ambulatory Visit (INDEPENDENT_AMBULATORY_CARE_PROVIDER_SITE_OTHER): Payer: Medicare Other | Admitting: *Deleted

## 2014-02-05 ENCOUNTER — Other Ambulatory Visit: Payer: Self-pay | Admitting: Interventional Cardiology

## 2014-02-05 DIAGNOSIS — I498 Other specified cardiac arrhythmias: Secondary | ICD-10-CM | POA: Diagnosis not present

## 2014-02-05 DIAGNOSIS — I4891 Unspecified atrial fibrillation: Secondary | ICD-10-CM | POA: Diagnosis not present

## 2014-02-05 DIAGNOSIS — R001 Bradycardia, unspecified: Secondary | ICD-10-CM

## 2014-02-05 LAB — MDC_IDC_ENUM_SESS_TYPE_REMOTE
Battery Remaining Longevity: 144 mo
Battery Voltage: 2.79 V
Brady Statistic AP VP Percent: 14.4 %
Brady Statistic AP VS Percent: 83 %
Brady Statistic AS VP Percent: 0.2 %
Brady Statistic AS VS Percent: 2.5 %
Lead Channel Impedance Value: 495 Ohm
Lead Channel Impedance Value: 649 Ohm
Lead Channel Pacing Threshold Amplitude: 0.625 V
Lead Channel Pacing Threshold Amplitude: 1.375 V
Lead Channel Pacing Threshold Pulse Width: 0.4 ms
Lead Channel Pacing Threshold Pulse Width: 0.4 ms
Lead Channel Sensing Intrinsic Amplitude: 8 mV
Lead Channel Setting Pacing Amplitude: 2 V
Lead Channel Setting Pacing Amplitude: 2.5 V
Lead Channel Setting Pacing Pulse Width: 0.4 ms
Lead Channel Setting Sensing Sensitivity: 2.8 mV

## 2014-02-06 NOTE — Progress Notes (Signed)
Remote pacemaker transmission.   

## 2014-02-12 DIAGNOSIS — M25569 Pain in unspecified knee: Secondary | ICD-10-CM | POA: Diagnosis not present

## 2014-02-20 ENCOUNTER — Ambulatory Visit (INDEPENDENT_AMBULATORY_CARE_PROVIDER_SITE_OTHER): Payer: Medicare Other | Admitting: Pharmacist Clinician (PhC)/ Clinical Pharmacy Specialist

## 2014-02-20 ENCOUNTER — Encounter: Payer: Self-pay | Admitting: Cardiology

## 2014-02-20 DIAGNOSIS — Z5181 Encounter for therapeutic drug level monitoring: Secondary | ICD-10-CM

## 2014-02-20 DIAGNOSIS — I4891 Unspecified atrial fibrillation: Secondary | ICD-10-CM | POA: Diagnosis not present

## 2014-02-20 LAB — POCT INR: INR: 2.6

## 2014-03-14 DIAGNOSIS — C61 Malignant neoplasm of prostate: Secondary | ICD-10-CM | POA: Diagnosis not present

## 2014-03-14 DIAGNOSIS — N320 Bladder-neck obstruction: Secondary | ICD-10-CM | POA: Diagnosis not present

## 2014-03-20 ENCOUNTER — Ambulatory Visit (INDEPENDENT_AMBULATORY_CARE_PROVIDER_SITE_OTHER): Payer: Medicare Other

## 2014-03-20 DIAGNOSIS — Z5181 Encounter for therapeutic drug level monitoring: Secondary | ICD-10-CM

## 2014-03-20 DIAGNOSIS — I4891 Unspecified atrial fibrillation: Secondary | ICD-10-CM

## 2014-03-20 LAB — POCT INR: INR: 2.2

## 2014-03-21 DIAGNOSIS — C61 Malignant neoplasm of prostate: Secondary | ICD-10-CM | POA: Diagnosis not present

## 2014-03-28 DIAGNOSIS — I5030 Unspecified diastolic (congestive) heart failure: Secondary | ICD-10-CM | POA: Diagnosis not present

## 2014-03-28 DIAGNOSIS — N183 Chronic kidney disease, stage 3 unspecified: Secondary | ICD-10-CM | POA: Diagnosis not present

## 2014-03-28 DIAGNOSIS — I129 Hypertensive chronic kidney disease with stage 1 through stage 4 chronic kidney disease, or unspecified chronic kidney disease: Secondary | ICD-10-CM | POA: Diagnosis not present

## 2014-03-28 DIAGNOSIS — I252 Old myocardial infarction: Secondary | ICD-10-CM | POA: Diagnosis not present

## 2014-03-29 ENCOUNTER — Other Ambulatory Visit: Payer: Self-pay | Admitting: Interventional Cardiology

## 2014-03-29 ENCOUNTER — Telehealth: Payer: Self-pay

## 2014-03-29 MED ORDER — WARFARIN SODIUM 2.5 MG PO TABS: ORAL_TABLET | ORAL | Status: DC

## 2014-03-29 NOTE — Telephone Encounter (Signed)
Refill done as requested 

## 2014-04-25 ENCOUNTER — Encounter: Payer: Self-pay | Admitting: Internal Medicine

## 2014-05-01 ENCOUNTER — Ambulatory Visit (INDEPENDENT_AMBULATORY_CARE_PROVIDER_SITE_OTHER): Payer: Medicare Other | Admitting: Pharmacist

## 2014-05-01 DIAGNOSIS — I4891 Unspecified atrial fibrillation: Secondary | ICD-10-CM

## 2014-05-01 DIAGNOSIS — Z5181 Encounter for therapeutic drug level monitoring: Secondary | ICD-10-CM | POA: Diagnosis not present

## 2014-05-01 LAB — POCT INR: INR: 2

## 2014-05-04 ENCOUNTER — Other Ambulatory Visit: Payer: Self-pay | Admitting: Interventional Cardiology

## 2014-05-07 ENCOUNTER — Other Ambulatory Visit: Payer: Self-pay

## 2014-05-09 ENCOUNTER — Encounter: Payer: Self-pay | Admitting: Internal Medicine

## 2014-05-09 ENCOUNTER — Telehealth: Payer: Self-pay | Admitting: Cardiology

## 2014-05-09 ENCOUNTER — Ambulatory Visit (INDEPENDENT_AMBULATORY_CARE_PROVIDER_SITE_OTHER): Payer: Medicare Other | Admitting: *Deleted

## 2014-05-09 DIAGNOSIS — I495 Sick sinus syndrome: Secondary | ICD-10-CM

## 2014-05-09 DIAGNOSIS — R001 Bradycardia, unspecified: Secondary | ICD-10-CM

## 2014-05-09 DIAGNOSIS — I498 Other specified cardiac arrhythmias: Secondary | ICD-10-CM

## 2014-05-09 NOTE — Telephone Encounter (Signed)
Spoke with pt and reminded pt of remote transmission that is due today. Pt verbalized understanding.   

## 2014-05-09 NOTE — Progress Notes (Signed)
Remote pacemaker transmission.   

## 2014-05-10 LAB — MDC_IDC_ENUM_SESS_TYPE_REMOTE
Brady Statistic AP VP Percent: 14.2 %
Brady Statistic AP VS Percent: 83.4 %
Brady Statistic AS VP Percent: 0.2 %
Brady Statistic AS VS Percent: 2.3 %
Lead Channel Impedance Value: 510 Ohm
Lead Channel Impedance Value: 665 Ohm
Lead Channel Pacing Threshold Amplitude: 0.75 V
Lead Channel Pacing Threshold Amplitude: 1.25 V
Lead Channel Pacing Threshold Pulse Width: 0.4 ms
Lead Channel Pacing Threshold Pulse Width: 0.4 ms
Lead Channel Sensing Intrinsic Amplitude: 22.4 mV

## 2014-05-14 ENCOUNTER — Other Ambulatory Visit: Payer: Self-pay

## 2014-05-14 MED ORDER — POTASSIUM CHLORIDE CRYS ER 20 MEQ PO TBCR: EXTENDED_RELEASE_TABLET | ORAL | Status: DC

## 2014-05-24 DIAGNOSIS — Z23 Encounter for immunization: Secondary | ICD-10-CM | POA: Diagnosis not present

## 2014-06-06 DIAGNOSIS — H401233 Low-tension glaucoma, bilateral, severe stage: Secondary | ICD-10-CM | POA: Diagnosis not present

## 2014-06-08 ENCOUNTER — Encounter: Payer: Self-pay | Admitting: Cardiology

## 2014-06-13 ENCOUNTER — Ambulatory Visit (INDEPENDENT_AMBULATORY_CARE_PROVIDER_SITE_OTHER): Payer: Medicare Other | Admitting: *Deleted

## 2014-06-13 DIAGNOSIS — I4891 Unspecified atrial fibrillation: Secondary | ICD-10-CM

## 2014-06-13 DIAGNOSIS — Z5181 Encounter for therapeutic drug level monitoring: Secondary | ICD-10-CM

## 2014-06-13 LAB — POCT INR: INR: 1.9

## 2014-06-25 ENCOUNTER — Other Ambulatory Visit: Payer: Self-pay | Admitting: Interventional Cardiology

## 2014-07-23 DIAGNOSIS — I739 Peripheral vascular disease, unspecified: Secondary | ICD-10-CM | POA: Diagnosis not present

## 2014-07-23 DIAGNOSIS — N183 Chronic kidney disease, stage 3 (moderate): Secondary | ICD-10-CM | POA: Diagnosis not present

## 2014-07-23 DIAGNOSIS — I480 Paroxysmal atrial fibrillation: Secondary | ICD-10-CM | POA: Diagnosis not present

## 2014-07-23 DIAGNOSIS — I10 Essential (primary) hypertension: Secondary | ICD-10-CM | POA: Diagnosis not present

## 2014-07-25 ENCOUNTER — Ambulatory Visit (INDEPENDENT_AMBULATORY_CARE_PROVIDER_SITE_OTHER): Payer: Medicare Other | Admitting: *Deleted

## 2014-07-25 DIAGNOSIS — I4891 Unspecified atrial fibrillation: Secondary | ICD-10-CM | POA: Diagnosis not present

## 2014-07-25 DIAGNOSIS — Z5181 Encounter for therapeutic drug level monitoring: Secondary | ICD-10-CM | POA: Diagnosis not present

## 2014-07-25 LAB — POCT INR: INR: 2.3

## 2014-08-02 ENCOUNTER — Encounter (HOSPITAL_COMMUNITY): Payer: Self-pay | Admitting: Internal Medicine

## 2014-08-11 ENCOUNTER — Encounter: Payer: Self-pay | Admitting: *Deleted

## 2014-08-27 ENCOUNTER — Encounter: Payer: Self-pay | Admitting: Internal Medicine

## 2014-08-27 ENCOUNTER — Ambulatory Visit (INDEPENDENT_AMBULATORY_CARE_PROVIDER_SITE_OTHER): Payer: Medicare Other | Admitting: Internal Medicine

## 2014-08-27 ENCOUNTER — Ambulatory Visit (INDEPENDENT_AMBULATORY_CARE_PROVIDER_SITE_OTHER): Payer: Medicare Other

## 2014-08-27 VITALS — BP 148/70 | HR 89 | Ht 70.0 in | Wt 189.4 lb

## 2014-08-27 DIAGNOSIS — Z950 Presence of cardiac pacemaker: Secondary | ICD-10-CM | POA: Diagnosis not present

## 2014-08-27 DIAGNOSIS — R001 Bradycardia, unspecified: Secondary | ICD-10-CM | POA: Diagnosis not present

## 2014-08-27 DIAGNOSIS — Z5181 Encounter for therapeutic drug level monitoring: Secondary | ICD-10-CM

## 2014-08-27 DIAGNOSIS — I481 Persistent atrial fibrillation: Secondary | ICD-10-CM

## 2014-08-27 DIAGNOSIS — I4891 Unspecified atrial fibrillation: Secondary | ICD-10-CM | POA: Diagnosis not present

## 2014-08-27 DIAGNOSIS — I4819 Other persistent atrial fibrillation: Secondary | ICD-10-CM

## 2014-08-27 DIAGNOSIS — I25810 Atherosclerosis of coronary artery bypass graft(s) without angina pectoris: Secondary | ICD-10-CM

## 2014-08-27 DIAGNOSIS — Z7901 Long term (current) use of anticoagulants: Secondary | ICD-10-CM | POA: Diagnosis not present

## 2014-08-27 LAB — MDC_IDC_ENUM_SESS_TYPE_INCLINIC
Battery Impedance: 132 Ohm
Battery Remaining Longevity: 143 mo
Battery Voltage: 2.79 V
Brady Statistic AP VP Percent: 14 %
Brady Statistic AP VS Percent: 83 %
Brady Statistic AS VP Percent: 0 %
Brady Statistic AS VS Percent: 3 %
Date Time Interrogation Session: 20160104143002
Lead Channel Impedance Value: 468 Ohm
Lead Channel Impedance Value: 576 Ohm
Lead Channel Pacing Threshold Amplitude: 0.75 V
Lead Channel Pacing Threshold Pulse Width: 0.4 ms
Lead Channel Sensing Intrinsic Amplitude: 0.35 mV
Lead Channel Sensing Intrinsic Amplitude: 15.67 mV
Lead Channel Setting Pacing Amplitude: 2 V
Lead Channel Setting Pacing Amplitude: 2 V
Lead Channel Setting Pacing Pulse Width: 0.4 ms
Lead Channel Setting Sensing Sensitivity: 4 mV

## 2014-08-27 LAB — POCT INR: INR: 1.9

## 2014-08-27 NOTE — Patient Instructions (Signed)
Your physician recommends that you schedule a follow-up appointment in: 2 months with Dr. [name].  Remote monitoring is used to monitor your Pacemaker of ICD from home. This monitoring reduces the number of office visits required to check your device to one time per year. It allows [place] to keep an eye on the functioning of your device to ensure it is working properly. You are scheduled for a device check from home on 11/26/14. You may send your transmission at any time that day. If you have a wireless device, the transmission will be sent automatically. After your physician reviews your transmission, you will receive a postcard with your next transmission date.  Your physician wants you to follow-up in: 1 year with Dr. Rayann Heman. You will receive a reminder letter in the mail two months in advance. If you don't receive a letter, please call our office to schedule the follow-up appointment.

## 2014-08-27 NOTE — Progress Notes (Signed)
Primary Cardiologist:  Dr [name]  Jonathan Warner is a 81 y.o. male with a h/o bradycardia sp PPM (MDT) who presents for follow-up in the Electrophysiology device clinic.  The patient remains very active despite his age. He is primarily limited by leg weakness.  He does have SOB with moderate activity but does not feel that this has changed in recent years.  He has been in persistent af for several months but is unaware.    Today, he  denies symptoms of palpitations, chest pain,  orthopnea, PND, lower extremity edema, dizziness, presyncope, syncope, or neurologic sequela.  The patientis tolerating medications without difficulties and is otherwise without complaint today.   Past Medical History  Diagnosis Date  . Symptomatic bradycardia     s/p PPM by Dr Leonia Reeves 2004  . Persistent atrial fibrillation   . CAD (coronary artery disease)     s/p CABG 1991  . GERD (gastroesophageal reflux disease)   . Hyperlipidemia   . BPH (benign prostatic hyperplasia)   . DJD (degenerative joint disease)   . Diverticulosis   . Hypertension   . Prostate cancer 04/27/12    Dr. Eulogio Ditch. Radiation therapy. Adenocarcinoma,gleason=3+4=7, 3+3=6,PSA=4.93  volume=51cc  . Arthritis   . A-fib   . Inguinal hernia   . Hiatal hernia   . Heart murmur   . DJD (degenerative joint disease)   . History of cardioversion     Afib, repeat CV 04/11/2010- Successful  . Coronary atherosclerosis   . Chronic kidney disease (CKD), stage III (moderate)   . 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 History of shingles   . Glaucoma   . Cataract     b/l surgery  . History of radiation therapy 07/25/2012-09/20/2012    prostate 7800 cGy 40 sessions, seminal vesicles 5600 cGy 40 sessions  . Heart attack     acute at least 2  . Pulmonary nodules     small rlung base nodule 4.43mm, scarring in lung bases  . Hypercholesteremia   . Congestive heart failure with left ventricular diastolic dysfunction, NYHA class 2   .  History of adenomatous polyp of colon 06/07/13    Last colonoscopy 06/27/13 showed small adenoma, no further testing due to advanced age.  . Claudication   . Lactose intolerance   . Kidney stones   . Absent testis     Left, at birth  . Tachy-brady syndrome     With DDD pacemaker-Dr. [name], gen change out 05/2012 per Romel Dumond.  . Osteomyelitis of forearm, right, acute     drainage x 2   Past Surgical History  Procedure Laterality Date  . Pacemaker insertion  05/14/03    MDT EnPulse implanted by Dr Leonia Reeves ddd  . Prostate biopsy  04/27/12    Adenocarcinoma,gleason=3+3=6, 3+4,& 4+3,PSA=4.93,volume=51cc  . Colonoscopy  12/21/2007    hx polyps, diverticulosis  . Coronary artery bypass graft  1991    x5; prior MI  . Colonoscopy with propofol N/A 06/27/2013    Procedure: COLONOSCOPY WITH PROPOFOL;  Surgeon: Garlan Fair, MD;  Location: [facility];  Service: Endoscopy;  Laterality: N/A;  . Tumor removal      skin tumors removed  . Cataract extraction w/ intraocular lens implant  2011  . Pacemaker generator change  05/24/12  . Cardiac catheterization  06/04/09    left w/noted patent bypass grafts  . Permanent pacemaker generator change N/A 05/24/2012    Procedure: PERMANENT PACEMAKER GENERATOR CHANGE;  Surgeon: Thompson Grayer, MD;  Location: [facility] C[facility] LAB;  Service: Cardiovascular;  Laterality: N/A;    History   Social History  . Marital Status: Married    Spouse Name: N/A    Number of Children: 1  . Years of Education: N/A   Occupational History  . Retired     Chief Financial Officer   Social History Main Topics  . Smoking status: Former Smoker -- 0.50 packs/day for 10 years    Types: Cigarettes    Quit date: 08/24/1981  . Smokeless tobacco: Never Used  . Alcohol Use: No  . Drug Use: No  . Sexual Activity: Not on file   Other Topics Concern  . 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   Allergies  Allergen  Reactions  . Crestor [Rosuvastatin]     Knee and leg pain    Current Outpatient Prescriptions  Medication Sig Dispense Refill  . acetaminophen (TYLENOL) 500 MG tablet Take 1,000 mg by mouth every 6 (six) hours as needed for pain.     [name] amLODipine (NORVASC) 5 MG tablet TAKE 1 TABLET EVERY MORNING (Patient taking differently: TAKE 1 TABLET BY MOUTH EVERY MORNING) 90 tablet 1  . brimonidine (ALPHAGAN) 0.2 % ophthalmic solution Place 1 drop into both eyes 2 (two) times daily.    . dorzolamide (TRUSOPT) 2 % ophthalmic solution Place 1 drop into both eyes 2 (two) times daily.    . furosemide (LASIX) 40 MG tablet TAKE 1 AND 1/2 TABLETS EVERY DAY (Patient taking differently: TAKE 1 AND 1/2 TABLETS BY MOUTH EVERY DAY) 135 tablet 1  . latanoprost (XALATAN) 0.005 % ophthalmic solution Apply 1 drop to eye at bedtime.     . Multiple Vitamin (MULTIVITAMIN) tablet Take 1 tablet by mouth daily.    . nitroGLYCERIN (NITROSTAT) 0.4 MG SL tablet Place 0.4 mg under the tongue every 5 (five) minutes as needed. For chest pain.    [name] omeprazole (PRILOSEC) 20 MG capsule Take 20 mg by mouth daily.    . potassium chloride SA (K-DUR,KLOR-CON) 20 MEQ tablet TAKE 1 AND 1/2 TABLETS EVERY DAY (Patient taking differently: TAKE 1 AND 1/2 TABLETS BY MOUTH EVERY DAY) 135 tablet 6  . sotalol (BETAPACE) 160 MG tablet TAKE 1 TABLET TWICE DAILY (Patient taking differently: TAKE 1 TABLET BY MOUTH TWICE DAILY) 180 tablet 1  . terazosin (HYTRIN) 5 MG capsule Take 5 mg by mouth at bedtime.    . vitamin E 400 UNIT capsule Take 400 Units by mouth daily.    [name] warfarin (COUMADIN) 2.5 MG tablet TAKE AS DIRECTED BY THE COUMADIN CLINIC 90 tablet 1   No current facility-administered medications for this visit.    ROS- all systems are reviewed and negative except as per HPI  Physical Exam: Filed Vitals:   08/27/14 1408  BP: 148/70  Pulse: 89  Height: 5\' 10"  (1.778 m)  Weight: 189 lb 6.4 oz (85.911 kg)    GEN- The patient is well  appearing, alert and oriented x 3 today.   Head- normocephalic, atraumatic Eyes-  Sclera clear, conjunctiva pink Ears- hearing intact Oropharynx- clear Neck- supple, no JVP Lymph- no cervical lymphadenopathy Lungs- Clear to ausculation bilaterally, normal work of breathing Chest- pacemaker pocket is well healed Heart- Regular rate and rhythm (paced) GI- soft, NT, ND, + BS Extremities- no clubbing, cyanosis, or edema   Pacemaker interrogation reviewed- see paceart ekg today reveals afib with V pacing  Assessment and Plan:  1. Persistent AF He has been in af for several months but appears to be asymptomatic I will have him follow-up with Dr [name] in the next few months to see if cardioversion is required.  If he remains asymptomatic with AF, it would be reasonable to stop sotalol at that time and rate control long term He is appropriately anticoagulated with coumadin  2. Symptomatic bradycardia Normal pacemaker function See Pace Art report No changes today  3. CAD Stable No change required today  4. htn Stable No change required today  Follow-up with Dr [name] as scheduled [facility]link Return to see the EP NP in 1 year

## 2014-09-19 ENCOUNTER — Other Ambulatory Visit: Payer: Self-pay | Admitting: Interventional Cardiology

## 2014-09-24 DIAGNOSIS — C61 Malignant neoplasm of prostate: Secondary | ICD-10-CM | POA: Diagnosis not present

## 2014-10-01 DIAGNOSIS — R911 Solitary pulmonary nodule: Secondary | ICD-10-CM | POA: Diagnosis not present

## 2014-10-01 DIAGNOSIS — N320 Bladder-neck obstruction: Secondary | ICD-10-CM | POA: Diagnosis not present

## 2014-10-01 DIAGNOSIS — C61 Malignant neoplasm of prostate: Secondary | ICD-10-CM | POA: Diagnosis not present

## 2014-10-08 ENCOUNTER — Ambulatory Visit (INDEPENDENT_AMBULATORY_CARE_PROVIDER_SITE_OTHER): Payer: Medicare Other | Admitting: *Deleted

## 2014-10-08 DIAGNOSIS — Z5181 Encounter for therapeutic drug level monitoring: Secondary | ICD-10-CM

## 2014-10-08 DIAGNOSIS — I4891 Unspecified atrial fibrillation: Secondary | ICD-10-CM | POA: Diagnosis not present

## 2014-10-08 LAB — POCT INR: INR: 2.2

## 2014-10-09 DIAGNOSIS — R918 Other nonspecific abnormal finding of lung field: Secondary | ICD-10-CM | POA: Diagnosis not present

## 2014-10-09 DIAGNOSIS — C61 Malignant neoplasm of prostate: Secondary | ICD-10-CM | POA: Diagnosis not present

## 2014-10-09 DIAGNOSIS — K449 Diaphragmatic hernia without obstruction or gangrene: Secondary | ICD-10-CM | POA: Diagnosis not present

## 2014-10-09 DIAGNOSIS — I2510 Atherosclerotic heart disease of native coronary artery without angina pectoris: Secondary | ICD-10-CM | POA: Diagnosis not present

## 2014-10-09 DIAGNOSIS — I517 Cardiomegaly: Secondary | ICD-10-CM | POA: Diagnosis not present

## 2014-10-10 DIAGNOSIS — H401233 Low-tension glaucoma, bilateral, severe stage: Secondary | ICD-10-CM | POA: Diagnosis not present

## 2014-10-26 ENCOUNTER — Ambulatory Visit (INDEPENDENT_AMBULATORY_CARE_PROVIDER_SITE_OTHER): Payer: Medicare Other | Admitting: Interventional Cardiology

## 2014-10-26 ENCOUNTER — Encounter: Payer: Self-pay | Admitting: Interventional Cardiology

## 2014-10-26 VITALS — BP 120/60 | HR 68 | Ht 69.0 in | Wt 188.8 lb

## 2014-10-26 DIAGNOSIS — I25812 Atherosclerosis of bypass graft of coronary artery of transplanted heart without angina pectoris: Secondary | ICD-10-CM

## 2014-10-26 DIAGNOSIS — Z5181 Encounter for therapeutic drug level monitoring: Secondary | ICD-10-CM

## 2014-10-26 DIAGNOSIS — R001 Bradycardia, unspecified: Secondary | ICD-10-CM | POA: Diagnosis not present

## 2014-10-26 DIAGNOSIS — I480 Paroxysmal atrial fibrillation: Secondary | ICD-10-CM

## 2014-10-26 DIAGNOSIS — Z7901 Long term (current) use of anticoagulants: Secondary | ICD-10-CM

## 2014-10-26 NOTE — Progress Notes (Signed)
Cardiology Office Note   Date:  10/26/2014   ID:  Jonathan Warner, DOB 22-Jul-1932, MRN [id number]  PCP:  Dorian Heckle, MD  Cardiologist:   Sinclair Grooms, MD   No chief complaint on file.     History of Present Illness: Jonathan Warner is a 81 y.o. male who presents for who has PAF and lung nodule. He denies angina, dyspnea, edema, and neurological complaints. He has had several falls. These of been because of imbalance. He has not had syncope. He denies angina. No blood in his urine or stool. No transient neurological symptoms have occurred.    Past Medical History  Diagnosis Date  . Symptomatic bradycardia     s/p PPM by Dr Leonia Reeves 2004  . Persistent atrial fibrillation   . CAD (coronary artery disease)     s/p CABG 1991  . GERD (gastroesophageal reflux disease)   . Hyperlipidemia   . BPH (benign prostatic hyperplasia)   . DJD (degenerative joint disease)   . Diverticulosis   . Hypertension   . Prostate cancer 04/27/12    Dr. Eulogio Ditch. Radiation therapy. Adenocarcinoma,gleason=3+4=7, 3+3=6,PSA=4.93  volume=51cc  . Arthritis   . A-fib   . Inguinal hernia   . Hiatal hernia   . Heart murmur   . DJD (degenerative joint disease)   . History of cardioversion     Afib, repeat CV 04/11/2010- Successful  . Coronary atherosclerosis   . Chronic kidney disease (CKD), stage III (moderate)   . 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 History of shingles   . Glaucoma   . Cataract     b/l surgery  . History of radiation therapy 07/25/2012-09/20/2012    prostate 7800 cGy 40 sessions, seminal vesicles 5600 cGy 40 sessions  . Heart attack     acute at least 2  . Pulmonary nodules     small rlung base nodule 4.13mm, scarring in lung bases  . Hypercholesteremia   . Congestive heart failure with left ventricular diastolic dysfunction, NYHA class 2   . History of adenomatous polyp of colon 06/07/13    Last colonoscopy 06/27/13 showed small adenoma, no further  testing due to advanced age.  . Claudication   . Lactose intolerance   . Kidney stones   . Absent testis     Left, at birth  . Tachy-brady syndrome     With DDD pacemaker-Dr. [name], gen change out 05/2012 per [name].  . Osteomyelitis of forearm, right, acute     drainage x 2    Past Surgical History  Procedure Laterality Date  . Pacemaker insertion  05/14/03    MDT EnPulse implanted by Dr Leonia Reeves ddd  . Prostate biopsy  04/27/12    Adenocarcinoma,gleason=3+3=6, 3+4,& 4+3,PSA=4.93,volume=51cc  . Colonoscopy  12/21/2007    hx polyps, diverticulosis  . Coronary artery bypass graft  1991    x5; prior MI  . Colonoscopy with propofol N/A 06/27/2013    Procedure: COLONOSCOPY WITH PROPOFOL;  Surgeon: Garlan Fair, MD;  Location: [facility];  Service: Endoscopy;  Laterality: N/A;  . Tumor removal      skin tumors removed  . Cataract extraction w/ intraocular lens implant  2011  . Pacemaker generator change  05/24/12  . Cardiac catheterization  06/04/09    left w/noted patent bypass grafts  . Permanent pacemaker generator change N/A 05/24/2012    Procedure: PERMANENT PACEMAKER GENERATOR CHANGE;  Surgeon: Thompson Grayer, MD;  Location: [facility]  CATH LAB;  Service: Cardiovascular;  Laterality: N/A;     Current Outpatient Prescriptions  Medication Sig Dispense Refill  . acetaminophen (TYLENOL) 500 MG tablet Take 1,000 mg by mouth every 6 (six) hours as needed for pain.     [name] amLODipine (NORVASC) 5 MG tablet TAKE 1 TABLET EVERY MORNING (Patient taking differently: TAKE 1 TABLET BY MOUTH EVERY MORNING) 90 tablet 1  . brimonidine (ALPHAGAN) 0.2 % ophthalmic solution Place 1 drop into both eyes 2 (two) times daily.    . dorzolamide (TRUSOPT) 2 % ophthalmic solution Place 1 drop into both eyes 2 (two) times daily.    . furosemide (LASIX) 40 MG tablet TAKE 1 AND 1/2 TABLETS EVERY DAY 135 tablet 1  . latanoprost (XALATAN) 0.005 % ophthalmic solution Apply 1 drop to eye at bedtime.     . Multiple  Vitamin (MULTIVITAMIN) tablet Take 1 tablet by mouth daily.    . nitroGLYCERIN (NITROSTAT) 0.4 MG SL tablet Place 0.4 mg under the tongue every 5 (five) minutes as needed. For chest pain.    [name] omeprazole (PRILOSEC) 20 MG capsule Take 20 mg by mouth daily.    . potassium chloride SA (K-DUR,KLOR-CON) 20 MEQ tablet TAKE 1 AND 1/2 TABLETS EVERY DAY (Patient taking differently: TAKE 1 AND 1/2 TABLETS BY MOUTH EVERY DAY) 135 tablet 6  . sotalol (BETAPACE) 160 MG tablet TAKE 1 TABLET TWICE DAILY (Patient taking differently: TAKE 1 TABLET BY MOUTH TWICE DAILY) 180 tablet 1  . terazosin (HYTRIN) 5 MG capsule Take 5 mg by mouth at bedtime.    . vitamin E 400 UNIT capsule Take 400 Units by mouth daily.    [name] warfarin (COUMADIN) 2.5 MG tablet TAKE AS DIRECTED BY THE COUMADIN CLINIC 90 tablet 1   No current facility-administered medications for this visit.    Allergies:   Crestor    Social History:  The patient  reports that he quit smoking about 33 years ago. His smoking use included Cigarettes. He has a 5 pack-year smoking history. He has never used smokeless tobacco. He reports that he does not drink alcohol or use illicit drugs.   Family History:  The patient's family history includes Alzheimer's disease in his father; CAD in his brother and mother; CVA in his brother; Congestive Heart Failure in his mother; Hypertension in his mother and sister; Pancreatic cancer in his brother.    ROS:  Please see the history of present illness.   Otherwise, review of systems are positive for difficulty with memory. Has fallen several times.   All other systems are reviewed and negative.    PHYSICAL EXAM: VS:  BP 120/60 mmHg  Pulse 68  Ht 5\' 9"  (1.753 m)  Wt 188 lb 12.8 oz (85.639 kg)  BMI 27.87 kg/m2 , BMI Body mass index is 27.87 kg/(m^2). GEN: Well nourished, well developed, in no acute distress HEENT: normal Neck: no JVD, carotid bruits, or masses Cardiac: RRR; no murmurs, rubs, or gallops,no edema    Respiratory:  clear to auscultation bilaterally, normal work of breathing GI: soft, nontender, nondistended, + BS MS: no deformity or atrophy Skin: warm and dry, no rash Neuro:  Strength and sensation are intact Psych: euthymic mood, full affect   EKG:  EKG is not ordered today. The ekg ordered today demonstrates last ECG  from January demonstrates a paced rhythm..   Recent Labs: No results found for requested labs within last 365 days.    Lipid Panel    Component Value Date/Time  CHOL 161 06/14/2013 0955   TRIG 250.0* 06/14/2013 0955   HDL 34.70* 06/14/2013 0955   CHOLHDL 5 06/14/2013 0955   VLDL 50.0* 06/14/2013 0955   LDLDIRECT 83.5 06/14/2013 0955      Wt Readings from Last 3 Encounters:  10/26/14 188 lb 12.8 oz (85.639 kg)  08/27/14 189 lb 6.4 oz (85.911 kg)  12/01/13 188 lb (85.276 kg)      Other studies Reviewed: Additional studies/ records that were reviewed today include: . Review of the above records demonstrates: Reviewed the urology note from Dr. Diona Fanti. There is a questionable pulmonary nodule. This we followed by CT. He has prostate cancer.   ASSESSMENT AND PLAN:  Coronary artery disease involving bypass graft  without angina pectoris: Asymptomatic  Bradycardia/tachycardia bradycardia syndrome: Manage with normally functioning pacemaker  Paroxysmal atrial fibrillation: Maintaining sinus rhythm predominantly with sotalol therapy  Encounter for therapeutic drug monitoring: Sotalol therapy without complications  Chronic anticoagulation: Without bleeding     Current medicines are reviewed at length with the patient today.  The patient does not have concerns regarding medicines.  The following changes have been made:  no change  Labs/ tests ordered today include:  No orders of the defined types were placed in this encounter.     Disposition:   FU with Linard Millers  in 1 year   Signed, Sinclair Grooms, MD  10/26/2014 1:45 PM    [facility] [address], [place], [place]  [address] Phone: [phone]; Fax: [phone]

## 2014-10-26 NOTE — Patient Instructions (Signed)
Your physician recommends that you continue on your current medications as directed. Please refer to the Current Medication list given to you today.  Your physician wants you to follow-up in: 1 year with Dr.Smith You will receive a reminder letter in the mail two months in advance. If you don't receive a letter, please call our office to schedule the follow-up appointment.  

## 2014-10-31 ENCOUNTER — Other Ambulatory Visit: Payer: Self-pay | Admitting: Interventional Cardiology

## 2014-11-19 ENCOUNTER — Ambulatory Visit (INDEPENDENT_AMBULATORY_CARE_PROVIDER_SITE_OTHER): Payer: Medicare Other | Admitting: Pharmacist

## 2014-11-19 DIAGNOSIS — I4891 Unspecified atrial fibrillation: Secondary | ICD-10-CM

## 2014-11-19 DIAGNOSIS — Z5181 Encounter for therapeutic drug level monitoring: Secondary | ICD-10-CM

## 2014-11-19 DIAGNOSIS — I480 Paroxysmal atrial fibrillation: Secondary | ICD-10-CM | POA: Diagnosis not present

## 2014-11-19 LAB — POCT INR: INR: 2.2

## 2014-11-23 DIAGNOSIS — L0889 Other specified local infections of the skin and subcutaneous tissue: Secondary | ICD-10-CM | POA: Diagnosis not present

## 2014-11-26 ENCOUNTER — Ambulatory Visit (INDEPENDENT_AMBULATORY_CARE_PROVIDER_SITE_OTHER): Payer: Medicare Other | Admitting: *Deleted

## 2014-11-26 DIAGNOSIS — I495 Sick sinus syndrome: Secondary | ICD-10-CM

## 2014-11-26 NOTE — Progress Notes (Signed)
Remote pacemaker transmission.   

## 2014-11-28 LAB — MDC_IDC_ENUM_SESS_TYPE_REMOTE
Battery Voltage: 2.79 V
Brady Statistic AP VP Percent: 8.7 %
Brady Statistic AP VS Percent: 86.6 %
Brady Statistic AS VP Percent: 0.7 %
Brady Statistic AS VS Percent: 4 %
Lead Channel Impedance Value: 488 Ohm
Lead Channel Impedance Value: 618 Ohm
Lead Channel Pacing Threshold Amplitude: 0.625 V
Lead Channel Pacing Threshold Amplitude: 1.5 V
Lead Channel Pacing Threshold Pulse Width: 0.4 ms
Lead Channel Pacing Threshold Pulse Width: 0.4 ms
Lead Channel Sensing Intrinsic Amplitude: 8 mV
Lead Channel Setting Pacing Amplitude: 2 V
Lead Channel Setting Pacing Amplitude: 2 V
Lead Channel Setting Pacing Pulse Width: 0.4 ms
Lead Channel Setting Sensing Sensitivity: 4 mV

## 2014-12-10 ENCOUNTER — Encounter: Payer: Self-pay | Admitting: Cardiology

## 2014-12-12 ENCOUNTER — Encounter: Payer: Self-pay | Admitting: Internal Medicine

## 2015-01-01 ENCOUNTER — Ambulatory Visit (INDEPENDENT_AMBULATORY_CARE_PROVIDER_SITE_OTHER): Payer: Medicare Other | Admitting: Surgery

## 2015-01-01 DIAGNOSIS — Z5181 Encounter for therapeutic drug level monitoring: Secondary | ICD-10-CM

## 2015-01-01 DIAGNOSIS — I480 Paroxysmal atrial fibrillation: Secondary | ICD-10-CM

## 2015-01-01 DIAGNOSIS — I4891 Unspecified atrial fibrillation: Secondary | ICD-10-CM | POA: Diagnosis not present

## 2015-01-01 LAB — POCT INR: INR: 1.8

## 2015-01-15 ENCOUNTER — Ambulatory Visit (INDEPENDENT_AMBULATORY_CARE_PROVIDER_SITE_OTHER): Payer: Medicare Other | Admitting: Surgery

## 2015-01-15 DIAGNOSIS — I480 Paroxysmal atrial fibrillation: Secondary | ICD-10-CM

## 2015-01-15 DIAGNOSIS — Z5181 Encounter for therapeutic drug level monitoring: Secondary | ICD-10-CM

## 2015-01-15 DIAGNOSIS — I4891 Unspecified atrial fibrillation: Secondary | ICD-10-CM | POA: Diagnosis not present

## 2015-01-15 LAB — POCT INR: INR: 2.4

## 2015-01-22 DIAGNOSIS — R911 Solitary pulmonary nodule: Secondary | ICD-10-CM | POA: Diagnosis not present

## 2015-01-22 DIAGNOSIS — I480 Paroxysmal atrial fibrillation: Secondary | ICD-10-CM | POA: Diagnosis not present

## 2015-01-22 DIAGNOSIS — I739 Peripheral vascular disease, unspecified: Secondary | ICD-10-CM | POA: Diagnosis not present

## 2015-01-22 DIAGNOSIS — M4810 Ankylosing hyperostosis [Forestier], site unspecified: Secondary | ICD-10-CM | POA: Diagnosis not present

## 2015-01-22 DIAGNOSIS — C61 Malignant neoplasm of prostate: Secondary | ICD-10-CM | POA: Diagnosis not present

## 2015-01-22 DIAGNOSIS — I10 Essential (primary) hypertension: Secondary | ICD-10-CM | POA: Diagnosis not present

## 2015-01-22 DIAGNOSIS — Z0000 Encounter for general adult medical examination without abnormal findings: Secondary | ICD-10-CM | POA: Diagnosis not present

## 2015-01-22 DIAGNOSIS — Z950 Presence of cardiac pacemaker: Secondary | ICD-10-CM | POA: Diagnosis not present

## 2015-01-22 DIAGNOSIS — Z1389 Encounter for screening for other disorder: Secondary | ICD-10-CM | POA: Diagnosis not present

## 2015-01-22 DIAGNOSIS — I5030 Unspecified diastolic (congestive) heart failure: Secondary | ICD-10-CM | POA: Diagnosis not present

## 2015-01-22 DIAGNOSIS — N183 Chronic kidney disease, stage 3 (moderate): Secondary | ICD-10-CM | POA: Diagnosis not present

## 2015-01-24 DIAGNOSIS — R918 Other nonspecific abnormal finding of lung field: Secondary | ICD-10-CM | POA: Diagnosis not present

## 2015-01-24 DIAGNOSIS — J8489 Other specified interstitial pulmonary diseases: Secondary | ICD-10-CM | POA: Diagnosis not present

## 2015-01-24 DIAGNOSIS — R911 Solitary pulmonary nodule: Secondary | ICD-10-CM | POA: Diagnosis not present

## 2015-01-30 DIAGNOSIS — H401233 Low-tension glaucoma, bilateral, severe stage: Secondary | ICD-10-CM | POA: Diagnosis not present

## 2015-02-04 ENCOUNTER — Other Ambulatory Visit: Payer: Self-pay | Admitting: Interventional Cardiology

## 2015-02-12 ENCOUNTER — Ambulatory Visit (INDEPENDENT_AMBULATORY_CARE_PROVIDER_SITE_OTHER): Payer: Medicare Other | Admitting: *Deleted

## 2015-02-12 DIAGNOSIS — I480 Paroxysmal atrial fibrillation: Secondary | ICD-10-CM

## 2015-02-12 DIAGNOSIS — I4891 Unspecified atrial fibrillation: Secondary | ICD-10-CM | POA: Diagnosis not present

## 2015-02-12 DIAGNOSIS — Z5181 Encounter for therapeutic drug level monitoring: Secondary | ICD-10-CM

## 2015-02-12 LAB — POCT INR: INR: 1.9

## 2015-02-18 ENCOUNTER — Other Ambulatory Visit: Payer: Self-pay

## 2015-02-26 ENCOUNTER — Ambulatory Visit (INDEPENDENT_AMBULATORY_CARE_PROVIDER_SITE_OTHER): Payer: Medicare Other | Admitting: *Deleted

## 2015-02-26 ENCOUNTER — Encounter: Payer: Self-pay | Admitting: Internal Medicine

## 2015-02-26 ENCOUNTER — Ambulatory Visit (INDEPENDENT_AMBULATORY_CARE_PROVIDER_SITE_OTHER): Payer: Medicare Other | Admitting: Pharmacist Clinician (PhC)/ Clinical Pharmacy Specialist

## 2015-02-26 DIAGNOSIS — Z5181 Encounter for therapeutic drug level monitoring: Secondary | ICD-10-CM

## 2015-02-26 DIAGNOSIS — I4891 Unspecified atrial fibrillation: Secondary | ICD-10-CM | POA: Diagnosis not present

## 2015-02-26 DIAGNOSIS — I480 Paroxysmal atrial fibrillation: Secondary | ICD-10-CM

## 2015-02-26 DIAGNOSIS — I495 Sick sinus syndrome: Secondary | ICD-10-CM | POA: Diagnosis not present

## 2015-02-26 LAB — POCT INR: INR: 2.5

## 2015-02-27 NOTE — Progress Notes (Signed)
Remote pacemaker transmission.   

## 2015-02-28 LAB — CUP PACEART REMOTE DEVICE CHECK
Battery Impedance: 156 Ohm
Battery Remaining Longevity: 136 mo
Battery Voltage: 2.79 V
Brady Statistic AP VP Percent: 13 %
Brady Statistic AP VS Percent: 81 %
Brady Statistic AS VP Percent: 1 %
Brady Statistic AS VS Percent: 4 %
Date Time Interrogation Session: 20160705151415
Lead Channel Impedance Value: 488 Ohm
Lead Channel Impedance Value: 631 Ohm
Lead Channel Pacing Threshold Amplitude: 0.625 V
Lead Channel Pacing Threshold Amplitude: 1.5 V
Lead Channel Pacing Threshold Pulse Width: 0.4 ms
Lead Channel Pacing Threshold Pulse Width: 0.4 ms
Lead Channel Sensing Intrinsic Amplitude: 8 mV
Lead Channel Setting Pacing Amplitude: 2 V
Lead Channel Setting Pacing Amplitude: 2 V
Lead Channel Setting Pacing Pulse Width: 0.4 ms
Lead Channel Setting Sensing Sensitivity: 4 mV

## 2015-03-06 ENCOUNTER — Encounter: Payer: Self-pay | Admitting: Cardiology

## 2015-03-18 ENCOUNTER — Other Ambulatory Visit: Payer: Self-pay | Admitting: Interventional Cardiology

## 2015-03-18 ENCOUNTER — Other Ambulatory Visit: Payer: Self-pay

## 2015-03-18 MED ORDER — FUROSEMIDE 40 MG PO TABS: ORAL_TABLET | ORAL | Status: DC

## 2015-03-20 ENCOUNTER — Ambulatory Visit (INDEPENDENT_AMBULATORY_CARE_PROVIDER_SITE_OTHER): Payer: Medicare Other | Admitting: *Deleted

## 2015-03-20 DIAGNOSIS — I4891 Unspecified atrial fibrillation: Secondary | ICD-10-CM

## 2015-03-20 DIAGNOSIS — I480 Paroxysmal atrial fibrillation: Secondary | ICD-10-CM

## 2015-03-20 DIAGNOSIS — Z5181 Encounter for therapeutic drug level monitoring: Secondary | ICD-10-CM | POA: Diagnosis not present

## 2015-03-20 LAB — POCT INR: INR: 2.6

## 2015-03-29 DIAGNOSIS — C61 Malignant neoplasm of prostate: Secondary | ICD-10-CM | POA: Diagnosis not present

## 2015-04-10 DIAGNOSIS — C61 Malignant neoplasm of prostate: Secondary | ICD-10-CM | POA: Diagnosis not present

## 2015-04-10 DIAGNOSIS — R911 Solitary pulmonary nodule: Secondary | ICD-10-CM | POA: Diagnosis not present

## 2015-04-17 ENCOUNTER — Ambulatory Visit (INDEPENDENT_AMBULATORY_CARE_PROVIDER_SITE_OTHER): Payer: Medicare Other | Admitting: *Deleted

## 2015-04-17 DIAGNOSIS — I4891 Unspecified atrial fibrillation: Secondary | ICD-10-CM

## 2015-04-17 DIAGNOSIS — I480 Paroxysmal atrial fibrillation: Secondary | ICD-10-CM

## 2015-04-17 DIAGNOSIS — Z5181 Encounter for therapeutic drug level monitoring: Secondary | ICD-10-CM | POA: Diagnosis not present

## 2015-04-17 LAB — POCT INR: INR: 2.1

## 2015-05-01 ENCOUNTER — Other Ambulatory Visit: Payer: Self-pay | Admitting: Interventional Cardiology

## 2015-05-15 ENCOUNTER — Ambulatory Visit (INDEPENDENT_AMBULATORY_CARE_PROVIDER_SITE_OTHER): Payer: Medicare Other | Admitting: *Deleted

## 2015-05-15 DIAGNOSIS — I4891 Unspecified atrial fibrillation: Secondary | ICD-10-CM | POA: Diagnosis not present

## 2015-05-15 DIAGNOSIS — I480 Paroxysmal atrial fibrillation: Secondary | ICD-10-CM | POA: Diagnosis not present

## 2015-05-15 DIAGNOSIS — Z5181 Encounter for therapeutic drug level monitoring: Secondary | ICD-10-CM

## 2015-05-15 DIAGNOSIS — Z23 Encounter for immunization: Secondary | ICD-10-CM | POA: Diagnosis not present

## 2015-05-15 LAB — POCT INR: INR: 2.4

## 2015-05-27 ENCOUNTER — Other Ambulatory Visit: Payer: Self-pay | Admitting: Interventional Cardiology

## 2015-05-28 ENCOUNTER — Ambulatory Visit (INDEPENDENT_AMBULATORY_CARE_PROVIDER_SITE_OTHER): Payer: Medicare Other | Admitting: *Deleted

## 2015-05-28 DIAGNOSIS — I495 Sick sinus syndrome: Secondary | ICD-10-CM

## 2015-05-28 NOTE — Progress Notes (Signed)
Remote pacemaker transmission.   

## 2015-05-30 LAB — CUP PACEART REMOTE DEVICE CHECK
Battery Impedance: 180 Ohm
Battery Remaining Longevity: 132 mo
Battery Voltage: 2.79 V
Brady Statistic AP VP Percent: 12 %
Brady Statistic AP VS Percent: 83 %
Brady Statistic AS VP Percent: 1 %
Brady Statistic AS VS Percent: 4 %
Date Time Interrogation Session: 20161004123448
Lead Channel Impedance Value: 502 Ohm
Lead Channel Impedance Value: 618 Ohm
Lead Channel Pacing Threshold Amplitude: 0.625 V
Lead Channel Pacing Threshold Amplitude: 1.375 V
Lead Channel Pacing Threshold Pulse Width: 0.4 ms
Lead Channel Pacing Threshold Pulse Width: 0.4 ms
Lead Channel Sensing Intrinsic Amplitude: 11.2 mV
Lead Channel Setting Pacing Amplitude: 2 V
Lead Channel Setting Pacing Amplitude: 2 V
Lead Channel Setting Pacing Pulse Width: 0.4 ms
Lead Channel Setting Sensing Sensitivity: 4 mV

## 2015-06-11 ENCOUNTER — Encounter: Payer: Self-pay | Admitting: *Deleted

## 2015-06-26 ENCOUNTER — Ambulatory Visit (INDEPENDENT_AMBULATORY_CARE_PROVIDER_SITE_OTHER): Payer: Medicare Other | Admitting: *Deleted

## 2015-06-26 DIAGNOSIS — Z5181 Encounter for therapeutic drug level monitoring: Secondary | ICD-10-CM

## 2015-06-26 DIAGNOSIS — I4891 Unspecified atrial fibrillation: Secondary | ICD-10-CM | POA: Diagnosis not present

## 2015-06-26 DIAGNOSIS — I480 Paroxysmal atrial fibrillation: Secondary | ICD-10-CM | POA: Diagnosis not present

## 2015-06-26 LAB — POCT INR: INR: 2.7

## 2015-07-01 DIAGNOSIS — Z961 Presence of intraocular lens: Secondary | ICD-10-CM | POA: Diagnosis not present

## 2015-07-01 DIAGNOSIS — H401213 Low-tension glaucoma, right eye, severe stage: Secondary | ICD-10-CM | POA: Diagnosis not present

## 2015-07-01 DIAGNOSIS — H5213 Myopia, bilateral: Secondary | ICD-10-CM | POA: Diagnosis not present

## 2015-07-01 DIAGNOSIS — H401233 Low-tension glaucoma, bilateral, severe stage: Secondary | ICD-10-CM | POA: Diagnosis not present

## 2015-07-29 ENCOUNTER — Other Ambulatory Visit: Payer: Self-pay | Admitting: Interventional Cardiology

## 2015-08-07 ENCOUNTER — Ambulatory Visit (INDEPENDENT_AMBULATORY_CARE_PROVIDER_SITE_OTHER): Payer: Medicare Other | Admitting: *Deleted

## 2015-08-07 DIAGNOSIS — I480 Paroxysmal atrial fibrillation: Secondary | ICD-10-CM | POA: Diagnosis not present

## 2015-08-07 DIAGNOSIS — Z5181 Encounter for therapeutic drug level monitoring: Secondary | ICD-10-CM | POA: Diagnosis not present

## 2015-08-07 DIAGNOSIS — I4891 Unspecified atrial fibrillation: Secondary | ICD-10-CM

## 2015-08-07 LAB — POCT INR: INR: 3.2

## 2015-08-12 ENCOUNTER — Other Ambulatory Visit: Payer: Self-pay | Admitting: Interventional Cardiology

## 2015-08-29 ENCOUNTER — Encounter: Payer: Self-pay | Admitting: Internal Medicine

## 2015-08-29 ENCOUNTER — Encounter: Payer: Self-pay | Admitting: Nurse Practitioner

## 2015-08-29 ENCOUNTER — Ambulatory Visit (INDEPENDENT_AMBULATORY_CARE_PROVIDER_SITE_OTHER): Payer: Medicare Other | Admitting: Nurse Practitioner

## 2015-08-29 VITALS — BP 110/60 | HR 88 | Ht 69.0 in | Wt 190.0 lb

## 2015-08-29 DIAGNOSIS — R001 Bradycardia, unspecified: Secondary | ICD-10-CM | POA: Diagnosis not present

## 2015-08-29 DIAGNOSIS — I481 Persistent atrial fibrillation: Secondary | ICD-10-CM

## 2015-08-29 DIAGNOSIS — I10 Essential (primary) hypertension: Secondary | ICD-10-CM | POA: Diagnosis not present

## 2015-08-29 DIAGNOSIS — I4819 Other persistent atrial fibrillation: Secondary | ICD-10-CM

## 2015-08-29 NOTE — Patient Instructions (Signed)
Medication Instructions:   STOP SOTALOL  If you need a refill on your cardiac medications before your next appointment, please call your pharmacy.  Labwork: NONE ORDER TODAY   Testing/Procedures:  NONE ORDER TODAY    Follow-Up:   IN MARCH WITH DR [name] RECALL IN  EPIC FOR 10/2015...   Your physician wants you to follow-up in: [place] will receive a reminder letter in the mail two months in advance. If you don't receive a letter, please call our office to schedule the follow-up appointment.     Any Other Special Instructions Will Be Listed Below (If Applicable).

## 2015-08-29 NOTE — Progress Notes (Signed)
Electrophysiology Office Note Date: 08/29/2015  ID:  Jonathan Warner, DOB September 25, 1931, MRN [id number]  PCP: [name], MD Primary Cardiologist: [name] Electrophysiologist: [name]  CC: Pacemaker follow-up  Jonathan Warner is a 82 y.o. male seen today for [name].  He presents today for routine electrophysiology followup.  Since last being seen in our clinic, the patient reports doing very well.  He denies chest pain, palpitations, dyspnea, PND, orthopnea, nausea, vomiting, dizziness, syncope, edema, weight gain, or early satiety.  Device History: MDT dual chamber PPM implanted 2004 for SSS; gen change 2013   Past Medical History  Diagnosis Date  . Symptomatic bradycardia     s/p PPM by Dr Leonia Reeves 2004  . Persistent atrial fibrillation ([place])   . CAD (coronary artery disease)     s/p CABG 1991  . GERD (gastroesophageal reflux disease)   . Hyperlipidemia   . BPH (benign prostatic hyperplasia)   . DJD (degenerative joint disease)   . Diverticulosis   . Hypertension   . Prostate cancer ([place]) 04/27/12    Dr. Eulogio Ditch. Radiation therapy. Adenocarcinoma,gleason=3+4=7, 3+3=6,PSA=4.93  volume=51cc  . Arthritis   . Inguinal hernia   . Hiatal hernia   . DJD (degenerative joint disease)   . Chronic kidney disease (CKD), stage III (moderate)   . 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 History of shingles   . Glaucoma   . Cataract     b/l surgery  . History of radiation therapy 07/25/2012-09/20/2012    prostate 7800 cGy 40 sessions, seminal vesicles 5600 cGy 40 sessions  . Pulmonary nodules     small rlung base nodule 4.47m, scarring in lung bases  . Hypercholesteremia   . History of adenomatous polyp of colon 06/07/13    Last colonoscopy 06/27/13 showed small adenoma, no further testing due to advanced age.  . Osteomyelitis of forearm, right, acute [place]     drainage x 2   Past Surgical History  Procedure Laterality Date  . Pacemaker insertion  05/14/03   MDT EnPulse implanted by Dr ELeonia Reevesddd  . Prostate biopsy  04/27/12    Adenocarcinoma,gleason=3+3=6, 3+4,& 4+3,PSA=4.93,volume=51cc  . Colonoscopy  12/21/2007    hx polyps, diverticulosis  . Coronary artery bypass graft  1991    x5; prior MI  . Colonoscopy with propofol N/A 06/27/2013    Procedure: COLONOSCOPY WITH PROPOFOL;  Surgeon: [name];  Location: [facility] ENDOSCOPY;  Service: Endoscopy;  Laterality: N/A;  . Tumor removal      skin tumors removed  . Cataract extraction w/ intraocular lens implant  2011  . Pacemaker generator change  05/24/12  . Cardiac catheterization  06/04/09    left w/noted patent bypass grafts  . Permanent pacemaker generator change N/A 05/24/2012    Procedure: PERMANENT PACEMAKER GENERATOR CHANGE;  Surgeon: [name];  [facility];  Service: Cardiovascular;  Laterality: N/A;    Current Outpatient Prescriptions  Medication Sig Dispense Refill  . acetaminophen (TYLENOL) 500 MG tablet Take 1,000 mg by mouth every 6 (six) hours as needed for pain.     .[name] KitchenamLODipine (NORVASC) 5 MG tablet TAKE 1 TABLET EVERY MORNING 90 tablet 1  . brimonidine (ALPHAGAN) 0.2 % ophthalmic solution Place 1 drop into both eyes 2 (two) times daily.    . dorzolamide (TRUSOPT) 2 % ophthalmic solution Place 1 drop into both eyes 2 (two) times daily.    . furosemide (LASIX) 40 MG tablet TAKE  1 AND 1/2 TABLETS EVERY DAY 135 tablet 0  . latanoprost (XALATAN) 0.005 % ophthalmic solution Apply 1 drop to eye at bedtime.     . Multiple Vitamin (MULTIVITAMIN) tablet Take 1 tablet by mouth daily.    . nitroGLYCERIN (NITROSTAT) 0.4 MG SL tablet Place 0.4 mg under the tongue every 5 (five) minutes as needed. For chest pain.    [name] omeprazole (PRILOSEC) 20 MG capsule Take 20 mg by mouth daily.    . potassium chloride SA (K-DUR,KLOR-CON) 20 MEQ tablet Take 1.5 tablets (30 mEq total) by mouth daily. 135 tablet 0  . sotalol (BETAPACE) 160 MG tablet TAKE 1 TABLET TWICE DAILY 180 tablet  2  . vitamin E 400 UNIT capsule Take 400 Units by mouth daily.    [name] warfarin (COUMADIN) 2.5 MG tablet TAKE AS DIRECTED BY THE COUMADIN CLINIC 90 tablet 1   No current facility-administered medications for this visit.    Allergies:   Crestor   Social History: Social History   Social History  . Marital Status: Married    Spouse Name: N/A  . Number of Children: 1  . Years of Education: N/A   Occupational History  . Retired     Chief Financial Officer   Social History Main Topics  . Smoking status: Former Smoker -- 0.50 packs/day for 10 years    Types: Cigarettes    Quit date: 08/24/1981  . Smokeless tobacco: Never Used  . Alcohol Use: No  . Drug Use: No  . Sexual Activity: Not on file   Other Topics Concern  . 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    Family History: Family History  Problem Relation Age of Onset  . Alzheimer's disease Father   . Hypertension Mother   . Congestive Heart Failure Mother   . CAD Mother   . CAD Brother   . Pancreatic cancer Brother   . CVA Brother   . Hypertension Sister     Has 6 sisters, many suffer from HTN  . Heart attack Brother   . Stroke Brother      Review of Systems: All other systems reviewed and are otherwise negative except as noted above.   Physical Exam: VS:  BP 110/60 mmHg  Pulse 88  Ht '5\' 9"'$  (1.753 m)  Wt 190 lb (86.183 kg)  BMI 28.05 kg/m2 , BMI Body mass index is 28.05 kg/(m^2).  GEN- The patient is well appearing, alert and oriented x 3 today.   HEENT: normocephalic, atraumatic; sclera clear, conjunctiva pink; hearing intact; oropharynx clear; neck supple  Lungs- Clear to ausculation bilaterally, normal work of breathing.  No wheezes, rales, rhonchi Heart- Irregular rate and rhythm  GI- soft, non-tender, non-distended, bowel sounds present  Extremities- no clubbing, cyanosis, or edema; DP/PT/radial pulses 1+ bilaterally MS- no  significant deformity or atrophy Skin- warm and dry, no rash or lesion; PPM pocket well healed Psych- euthymic mood, full affect Neuro- strength and sensation are intact  PPM Interrogation- reviewed in detail today,  See PACEART report  EKG:  EKG is ordered today. The ekg ordered today shows atrial fibrillation with V pacing  Recent Labs: No results found for requested labs within last 365 days.   Wt Readings from Last 3 Encounters:  08/29/15 190 lb (86.183 kg)  10/26/14 188 lb 12.8 oz (85.639 kg)  08/27/14 189 lb 6.4 oz (85.911 kg)     Other studies Reviewed: Additional studies/  records that were reviewed today include: [name] and Dr Thompson Caul office ntoes  Assessment and Plan:  1.  Symptomatic bradycardia Normal PPM function See Pace Art report No changes today  2.  Persistent atrial fibrillation Burden by device interrogation today 47.1% over the last year, but nearly 100% since September Continue Warfarin for CHADS2VASC of 4 V rates controlled Pt asymptomatic with AF, will discontinue Sotalol at this time  3.  CAD No recent ischemic symptoms  4.  HTN Stable No change required today   Current medicines are reviewed at length with the patient today.   The patient does not have concerns regarding his medicines.  The following changes were made today:  none  Labs/ tests ordered today include: none   Disposition:   Follow up with [facility] transmissions, Dr [name] as scheduled, [name] in 1 year     Signed, Chanetta Marshall, NP 08/29/2015 1:47 PM  [address] [place] [address] [phone] (office) [phone] (fax)

## 2015-09-03 ENCOUNTER — Other Ambulatory Visit: Payer: Self-pay | Admitting: *Deleted

## 2015-09-03 ENCOUNTER — Telehealth: Payer: Self-pay | Admitting: Interventional Cardiology

## 2015-09-03 NOTE — Telephone Encounter (Signed)
Pt c/o of Chest Pain: STAT if CP now or developed within 24 hours  PT stated- at last OV- 08/29/15 stopped taking sotalol- pt wondering if he needs to continue with this med.   1. Are you having CP right now? no  2. Are you experiencing any other symptoms (ex. SOB, nausea, vomiting, sweating)? no  3. How long have you been experiencing CP? This am   4. Is your CP continuous or coming and going? Came and went   5. Have you taken Nitroglycerin? no ?

## 2015-09-03 NOTE — Telephone Encounter (Signed)
Calling stating he had some CP for about 1 hr this AM about 8:00.  States on scale of 1-10 was a "3".  Did not take any NTG. No other symptoms ie: SOB, pain into arm or neck, nausea or sweating. No CP now and feels fine. States BP at 8:00 was 193/92 HR 75; at 8:15 188/91 HR 71; at 8:30 157/71 HR 71; at 10:00 157/75 HR 76; while talking to him at 11:15 150/87 HR 76.  His questions is should he restart the Sotalol.  States [place] told him to stop taking the Sotalol on 1/5 at office visit. States he feels his irreg HR at times but other times doesn't bother him.  States he is more concerned with BP.  Spoke w/Dr. [name] who advises for him to take one time extra dose of Amlodpine 5 mg now. Advised to monitor BP over next few days and to call if BP continues to be elevated.  Dr. [name] advised NOT to restart Sotalol.  Advised pt to write down his readings with HR and how he was feeling and to call office in a couple of days.  Also if he develops CP again, take NTG and if persists to go to ER.  He verbalizes understanding and will call with BP readings. This is a very pleasant man.

## 2015-09-04 LAB — CUP PACEART INCLINIC DEVICE CHECK
Date Time Interrogation Session: 20170111113040
Implantable Lead Implant Date: 20040920
Implantable Lead Implant Date: 20040920
Implantable Lead Location: 753859
Implantable Lead Location: 753860
Implantable Lead Model: 5076
Implantable Lead Model: 5092
Lead Channel Setting Pacing Amplitude: 2 V
Lead Channel Setting Pacing Amplitude: 2 V
Lead Channel Setting Pacing Pulse Width: 0.4 ms
Lead Channel Setting Sensing Sensitivity: 4 mV

## 2015-09-09 ENCOUNTER — Telehealth: Payer: Self-pay | Admitting: Interventional Cardiology

## 2015-09-09 ENCOUNTER — Other Ambulatory Visit: Payer: Self-pay | Admitting: Interventional Cardiology

## 2015-09-09 NOTE — Telephone Encounter (Signed)
Returned pt call. Pt sts that he has increased bilateral Le swelling. Pt sts that his weights are stable, he denies sob, orthopnea or any other symptoms. Pt sts that he is able to put his feet into his shoes comfortably. Pt does not wear/have compression stockings. Pt is taking Lasix '60mg'$  qd as prescribed. Pt sts that he was working on a puzzle for hours this weekend with his feet on the ground.adv pt to try elevating his feet as much as possible today. Adv pt to call the office in the morning to give an update on swelling. Pt agreeable with plan and verbalized understanding.  Discussed with Ann Maki who agrees with plan

## 2015-09-09 NOTE — Telephone Encounter (Signed)
Pt has swelling in both feet up to ankle since Saturday, pt on Lasix, pls advise [phone]

## 2015-09-11 ENCOUNTER — Ambulatory Visit (INDEPENDENT_AMBULATORY_CARE_PROVIDER_SITE_OTHER): Payer: Medicare Other | Admitting: *Deleted

## 2015-09-11 DIAGNOSIS — I481 Persistent atrial fibrillation: Secondary | ICD-10-CM

## 2015-09-11 DIAGNOSIS — I4891 Unspecified atrial fibrillation: Secondary | ICD-10-CM | POA: Diagnosis not present

## 2015-09-11 DIAGNOSIS — I4819 Other persistent atrial fibrillation: Secondary | ICD-10-CM

## 2015-09-11 DIAGNOSIS — Z5181 Encounter for therapeutic drug level monitoring: Secondary | ICD-10-CM

## 2015-09-11 LAB — POCT INR: INR: 3.1

## 2015-10-02 ENCOUNTER — Ambulatory Visit (INDEPENDENT_AMBULATORY_CARE_PROVIDER_SITE_OTHER): Payer: Medicare Other | Admitting: *Deleted

## 2015-10-02 DIAGNOSIS — I481 Persistent atrial fibrillation: Secondary | ICD-10-CM

## 2015-10-02 DIAGNOSIS — I4891 Unspecified atrial fibrillation: Secondary | ICD-10-CM

## 2015-10-02 DIAGNOSIS — Z5181 Encounter for therapeutic drug level monitoring: Secondary | ICD-10-CM | POA: Diagnosis not present

## 2015-10-02 DIAGNOSIS — I4819 Other persistent atrial fibrillation: Secondary | ICD-10-CM

## 2015-10-02 LAB — POCT INR: INR: 2.8

## 2015-10-26 ENCOUNTER — Other Ambulatory Visit: Payer: Self-pay | Admitting: Interventional Cardiology

## 2015-10-28 ENCOUNTER — Other Ambulatory Visit: Payer: Self-pay

## 2015-10-28 MED ORDER — POTASSIUM CHLORIDE CRYS ER 20 MEQ PO TBCR: 30.0000 meq | EXTENDED_RELEASE_TABLET | Freq: Every day | ORAL | Status: DC

## 2015-10-28 MED ORDER — AMLODIPINE BESYLATE 5 MG PO TABS: 5.0000 mg | ORAL_TABLET | Freq: Every morning | ORAL | Status: DC

## 2015-10-28 MED ORDER — FUROSEMIDE 40 MG PO TABS: ORAL_TABLET | ORAL | Status: DC

## 2015-11-05 ENCOUNTER — Ambulatory Visit (INDEPENDENT_AMBULATORY_CARE_PROVIDER_SITE_OTHER): Payer: Medicare Other | Admitting: Interventional Cardiology

## 2015-11-05 ENCOUNTER — Ambulatory Visit (INDEPENDENT_AMBULATORY_CARE_PROVIDER_SITE_OTHER): Payer: Medicare Other | Admitting: Pharmacist

## 2015-11-05 ENCOUNTER — Encounter: Payer: Self-pay | Admitting: Interventional Cardiology

## 2015-11-05 VITALS — BP 126/74 | HR 90 | Ht 69.0 in | Wt 187.1 lb

## 2015-11-05 DIAGNOSIS — I495 Sick sinus syndrome: Secondary | ICD-10-CM

## 2015-11-05 DIAGNOSIS — R0602 Shortness of breath: Secondary | ICD-10-CM | POA: Diagnosis not present

## 2015-11-05 DIAGNOSIS — I4891 Unspecified atrial fibrillation: Secondary | ICD-10-CM

## 2015-11-05 DIAGNOSIS — Z7901 Long term (current) use of anticoagulants: Secondary | ICD-10-CM

## 2015-11-05 DIAGNOSIS — I482 Chronic atrial fibrillation, unspecified: Secondary | ICD-10-CM

## 2015-11-05 DIAGNOSIS — I25812 Atherosclerosis of bypass graft of coronary artery of transplanted heart without angina pectoris: Secondary | ICD-10-CM

## 2015-11-05 DIAGNOSIS — R001 Bradycardia, unspecified: Secondary | ICD-10-CM | POA: Diagnosis not present

## 2015-11-05 DIAGNOSIS — Z5181 Encounter for therapeutic drug level monitoring: Secondary | ICD-10-CM

## 2015-11-05 DIAGNOSIS — I10 Essential (primary) hypertension: Secondary | ICD-10-CM

## 2015-11-05 LAB — BASIC METABOLIC PANEL
BUN: 13 mg/dL (ref 7–25)
CO2: 30 mmol/L (ref 20–31)
Calcium: 9.5 mg/dL (ref 8.6–10.3)
Chloride: 100 mmol/L (ref 98–110)
Creat: 1.13 mg/dL — ABNORMAL HIGH (ref 0.70–1.11)
Glucose, Bld: 103 mg/dL — ABNORMAL HIGH (ref 65–99)
Potassium: 3.8 mmol/L (ref 3.5–5.3)
Sodium: 139 mmol/L (ref 135–146)

## 2015-11-05 LAB — POCT INR: INR: 2.3

## 2015-11-05 NOTE — Progress Notes (Signed)
Cardiology Office Note   Date:  11/05/2015   ID:  Jonathan Warner, DOB Mar 01, 1932, MRN [id number]  PCP:  Jonathan Screws, MD  Cardiologist:  Jonathan Grooms, MD   Chief Complaint  Patient presents with  . Atrial Fibrillation      History of Present Illness: Jonathan Warner is a 82 y.o. male who presents for Follow-up of coronary artery disease with bypass surgery 1991, atrial fibrillation, tachycardia-bradycardia syndrome with [employer] DDD pacemaker, hyperlipidemia, and chronic limiting osteoarthritis.  The patient's major complaint is that of difficulty with walking due to pain and discomfort in his thigh and buttocks and calves. Legs every week with walking and fill as though they will buckle. This prevents some physical activity that he would like to engage in. This complaint has been chronic and progressive. Lower extremity Doppler studies performed in 2014 for the same complaint revealed diffuse nonobstructive disease with normal ABI bilaterally.  He denies angina and palpitations.  Past Medical History  Diagnosis Date  . Symptomatic bradycardia     s/p PPM by Dr Leonia Reeves 2004  . Persistent atrial fibrillation ([place])   . CAD (coronary artery disease)     s/p CABG 1991  . GERD (gastroesophageal reflux disease)   . Hyperlipidemia   . BPH (benign prostatic hyperplasia)   . DJD (degenerative joint disease)   . Diverticulosis   . Hypertension   . Prostate cancer ([place]) 04/27/12    Dr. Eulogio Ditch. Radiation therapy. Adenocarcinoma,gleason=3+4=7, 3+3=6,PSA=4.93  volume=51cc  . Arthritis   . Inguinal hernia   . Hiatal hernia   . DJD (degenerative joint disease)   . Chronic kidney disease (CKD), stage III (moderate)   . 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 History of shingles   . Glaucoma   . Cataract     b/l surgery  . History of radiation therapy 07/25/2012-09/20/2012    prostate 7800 cGy 40 sessions, seminal vesicles 5600 cGy 40 sessions  . Pulmonary  nodules     small rlung base nodule 4.75m, scarring in lung bases  . Hypercholesteremia   . History of adenomatous polyp of colon 06/07/13    Last colonoscopy 06/27/13 showed small adenoma, no further testing due to advanced age.  . Osteomyelitis of [place], right, [place]     drainage x 2    Past Surgical History  Procedure Laterality Date  . Pacemaker insertion  05/14/03    MDT EnPulse implanted by Dr ELeonia Reevesddd  . Prostate biopsy  04/27/12    Adenocarcinoma,gleason=3+3=6, 3+4,& 4+3,PSA=4.93,volume=51cc  . Colonoscopy  12/21/2007    hx polyps, diverticulosis  . Coronary artery bypass graft  1991    x5; prior MI  . Colonoscopy with propofol N/A 06/27/2013    Procedure: COLONOSCOPY WITH PROPOFOL;  Surgeon: [name];  Location: [facility];  Service: Endoscopy;  Laterality: N/A;  . Tumor removal      skin tumors removed  . Cataract extraction w/ intraocular lens implant  2011  . Pacemaker generator change  05/24/12  . Cardiac catheterization  06/04/09    left w/noted patent bypass grafts  . Permanent pacemaker generator change N/A 05/24/2012    Procedure: PERMANENT PACEMAKER GENERATOR CHANGE;  Surgeon: [name];  [facility];  Service: Cardiovascular;  Laterality: N/A;     Current Outpatient Prescriptions  Medication Sig Dispense Refill  . acetaminophen (TYLENOL) 500 MG tablet Take 1,000 mg by mouth every 6 (six) hours  as needed for pain.     [name] amLODipine (NORVASC) 5 MG tablet Take 1 tablet (5 mg total) by mouth every morning. 90 tablet 2  . brimonidine (ALPHAGAN) 0.2 % ophthalmic solution Place 1 drop into both eyes 2 (two) times daily.    . dorzolamide (TRUSOPT) 2 % ophthalmic solution Place 1 drop into both eyes 2 (two) times daily.    . furosemide (LASIX) 40 MG tablet TAKE 1 AND 1/2 TABLETS BY MOUTH EVERY DAY 135 tablet 2  . latanoprost (XALATAN) 0.005 % ophthalmic solution Apply 1 drop to eye at bedtime.     . Multiple Vitamin (MULTIVITAMIN)  tablet Take 1 tablet by mouth daily.    . nitroGLYCERIN (NITROSTAT) 0.4 MG SL tablet Place 1 tablet (0.4 mg total) under the tongue every 5 (five) minutes as needed for chest pain. 25 tablet 1  . omeprazole (PRILOSEC) 20 MG capsule Take 20 mg by mouth daily.    . potassium chloride SA (K-DUR,KLOR-CON) 20 MEQ tablet Take 1.5 tablets (30 mEq total) by mouth daily. 135 tablet 2  . vitamin E 400 UNIT capsule Take 400 Units by mouth daily.    [name] warfarin (COUMADIN) 2.5 MG tablet TAKE AS DIRECTED BY THE COUMADIN CLINIC 90 tablet 1   No current facility-administered medications for this visit.    Allergies:   Crestor    Social History:  The patient  reports that he quit smoking about 34 years ago. His smoking use included Cigarettes. He has a 5 pack-year smoking history. He has never used smokeless tobacco. He reports that he does not drink alcohol or use illicit drugs.   Family History:  The patient's family history includes Alzheimer's disease in his father; CAD in his brother and mother; CVA in his brother; Congestive Heart Failure in his mother; Heart attack in his brother; Hypertension in his mother and sister; Pancreatic cancer in his brother; Stroke in his brother.    ROS:  Please see the history of present illness.   Otherwise, review of systems are positive for leg fatigue, leg pain, occasional swelling in the legs, easy bruising, difficulty with balance and walking, diffuse joint deformity and stiffness involving the hands and knees and feet..   All other systems are reviewed and negative.    PHYSICAL EXAM: VS:  BP 126/74 mmHg  Pulse 90  Ht '5\' 9"'$  (1.753 m)  Wt 187 lb 1.9 oz (84.877 kg)  BMI 27.62 kg/m2 , BMI Body mass index is 27.62 kg/(m^2). GEN: Well nourished, well developed, in no acute distress HEENT: normal Neck: no JVD, carotid bruits, or masses Cardiac: RRR.  There is no murmur, rub, or gallop. There is no edema. Respiratory:  clear to auscultation bilaterally, normal work of  breathing. GI: soft, nontender, nondistended, + BS MS: no deformity or atrophy Skin: warm and dry, no rash Neuro:  Strength and sensation are intact Psych: euthymic mood, full affect   EKG:  EKG is not  ordered today. The ekg reveals EKG from January which shows ventricular pacing with underlying atrial fib.  Recent Labs: No results found for requested labs within last 365 days.    Lipid Panel    Component Value Date/Time   CHOL 161 06/14/2013 0955   TRIG 250.0* 06/14/2013 0955   HDL 34.70* 06/14/2013 0955   CHOLHDL 5 06/14/2013 0955   VLDL 50.0* 06/14/2013 0955   LDLDIRECT 83.5 06/14/2013 0955      Wt Readings from Last 3 Encounters:  11/05/15 187 lb 1.9  oz (84.877 kg)  08/29/15 190 lb (86.183 kg)  10/26/14 188 lb 12.8 oz (85.639 kg)      Other studies Reviewed: Additional studies/ records that were reviewed today include: Reviewed prior Doppler studies on the lower extremities.. The findings include no significant obstructive disease and normal ABI.    ASSESSMENT AND PLAN:  1. Chronic atrial fibrillation (HCC) Because of 50% atrial fibrillation when seen in the pacemaker clinic, sotalol was discontinued. The patient is noticed no difference in his overall status.   2. Coronary artery disease involving bypass graft of transplanted heart without angina pectoris Denies angina   3. Bradycardia Treated with pacer   4. Chronic anticoagulation No bleeding complications   5. Essential hypertension Well controlled   6. Tachy-brady syndrome (HCC) DDD pacemaker   Current medicines are reviewed at length with the patient today.  The patient has the following concerns regarding medicines: Doing well off sotalol.  The following changes/actions have been instituted:    Encouraged aerobic activity   Call if angina  No change in the current medical regimen    Labs/ tests ordered today include:  No orders of the defined types were placed in this encounter.       Disposition:   FU with HS in 1 year  Signed, Jonathan Grooms, MD  11/05/2015 1:38 PM    [facility] [place], [place], [place]  [address] Phone: [phone]; Fax: [phone]

## 2015-11-05 NOTE — Patient Instructions (Signed)
Medication Instructions:  Your physician recommends that you continue on your current medications as directed. Please refer to the Current Medication list given to you today.   Labwork: Bmet, Bnp today  Testing/Procedures: None ordered  Follow-Up: Your physician wants you to follow-up in: 1 year You will receive a reminder letter in the mail two months in advance. If you don't receive a letter, please call our office to schedule the follow-up appointment.   Any Other Special Instructions Will Be Listed Below (If Applicable).     If you need a refill on your cardiac medications before your next appointment, please call your pharmacy.

## 2015-11-06 LAB — BRAIN NATRIURETIC PEPTIDE: Brain Natriuretic Peptide: 222.3 pg/mL — ABNORMAL HIGH (ref <100)

## 2015-12-03 ENCOUNTER — Ambulatory Visit (INDEPENDENT_AMBULATORY_CARE_PROVIDER_SITE_OTHER): Payer: Medicare Other | Admitting: *Deleted

## 2015-12-03 DIAGNOSIS — Z5181 Encounter for therapeutic drug level monitoring: Secondary | ICD-10-CM | POA: Diagnosis not present

## 2015-12-03 DIAGNOSIS — I4891 Unspecified atrial fibrillation: Secondary | ICD-10-CM | POA: Diagnosis not present

## 2015-12-03 DIAGNOSIS — I482 Chronic atrial fibrillation, unspecified: Secondary | ICD-10-CM

## 2015-12-03 LAB — POCT INR: INR: 2

## 2015-12-30 DIAGNOSIS — H401113 Primary open-angle glaucoma, right eye, severe stage: Secondary | ICD-10-CM | POA: Diagnosis not present

## 2015-12-30 DIAGNOSIS — H401123 Primary open-angle glaucoma, left eye, severe stage: Secondary | ICD-10-CM | POA: Diagnosis not present

## 2016-01-15 ENCOUNTER — Ambulatory Visit (INDEPENDENT_AMBULATORY_CARE_PROVIDER_SITE_OTHER): Payer: Medicare Other | Admitting: Surgery

## 2016-01-15 DIAGNOSIS — I482 Chronic atrial fibrillation, unspecified: Secondary | ICD-10-CM

## 2016-01-15 DIAGNOSIS — I4891 Unspecified atrial fibrillation: Secondary | ICD-10-CM | POA: Diagnosis not present

## 2016-01-15 DIAGNOSIS — Z5181 Encounter for therapeutic drug level monitoring: Secondary | ICD-10-CM | POA: Diagnosis not present

## 2016-01-15 LAB — POCT INR: INR: 2

## 2016-01-23 DIAGNOSIS — Z1389 Encounter for screening for other disorder: Secondary | ICD-10-CM | POA: Diagnosis not present

## 2016-01-23 DIAGNOSIS — I10 Essential (primary) hypertension: Secondary | ICD-10-CM | POA: Diagnosis not present

## 2016-01-23 DIAGNOSIS — M159 Polyosteoarthritis, unspecified: Secondary | ICD-10-CM | POA: Diagnosis not present

## 2016-01-23 DIAGNOSIS — Z23 Encounter for immunization: Secondary | ICD-10-CM | POA: Diagnosis not present

## 2016-01-23 DIAGNOSIS — I5030 Unspecified diastolic (congestive) heart failure: Secondary | ICD-10-CM | POA: Diagnosis not present

## 2016-01-23 DIAGNOSIS — E785 Hyperlipidemia, unspecified: Secondary | ICD-10-CM | POA: Diagnosis not present

## 2016-01-23 DIAGNOSIS — R29898 Other symptoms and signs involving the musculoskeletal system: Secondary | ICD-10-CM | POA: Diagnosis not present

## 2016-01-23 DIAGNOSIS — Z79899 Other long term (current) drug therapy: Secondary | ICD-10-CM | POA: Diagnosis not present

## 2016-01-23 DIAGNOSIS — Z950 Presence of cardiac pacemaker: Secondary | ICD-10-CM | POA: Diagnosis not present

## 2016-01-23 DIAGNOSIS — Z0000 Encounter for general adult medical examination without abnormal findings: Secondary | ICD-10-CM | POA: Diagnosis not present

## 2016-01-23 DIAGNOSIS — I480 Paroxysmal atrial fibrillation: Secondary | ICD-10-CM | POA: Diagnosis not present

## 2016-01-23 DIAGNOSIS — I2510 Atherosclerotic heart disease of native coronary artery without angina pectoris: Secondary | ICD-10-CM | POA: Diagnosis not present

## 2016-01-23 DIAGNOSIS — E559 Vitamin D deficiency, unspecified: Secondary | ICD-10-CM | POA: Diagnosis not present

## 2016-02-26 ENCOUNTER — Ambulatory Visit (INDEPENDENT_AMBULATORY_CARE_PROVIDER_SITE_OTHER): Payer: Medicare Other | Admitting: Pharmacist

## 2016-02-26 DIAGNOSIS — I4891 Unspecified atrial fibrillation: Secondary | ICD-10-CM

## 2016-02-26 DIAGNOSIS — Z5181 Encounter for therapeutic drug level monitoring: Secondary | ICD-10-CM | POA: Diagnosis not present

## 2016-02-26 DIAGNOSIS — I482 Chronic atrial fibrillation, unspecified: Secondary | ICD-10-CM

## 2016-02-26 LAB — POCT INR: INR: 2

## 2016-03-09 ENCOUNTER — Other Ambulatory Visit: Payer: Self-pay | Admitting: Interventional Cardiology

## 2016-03-19 DIAGNOSIS — H401123 Primary open-angle glaucoma, left eye, severe stage: Secondary | ICD-10-CM | POA: Diagnosis not present

## 2016-03-19 DIAGNOSIS — Z951 Presence of aortocoronary bypass graft: Secondary | ICD-10-CM | POA: Diagnosis not present

## 2016-03-19 DIAGNOSIS — Z7901 Long term (current) use of anticoagulants: Secondary | ICD-10-CM | POA: Diagnosis not present

## 2016-03-19 DIAGNOSIS — Z950 Presence of cardiac pacemaker: Secondary | ICD-10-CM | POA: Diagnosis not present

## 2016-03-19 DIAGNOSIS — Z87891 Personal history of nicotine dependence: Secondary | ICD-10-CM | POA: Diagnosis not present

## 2016-03-19 DIAGNOSIS — H401113 Primary open-angle glaucoma, right eye, severe stage: Secondary | ICD-10-CM | POA: Diagnosis not present

## 2016-04-08 ENCOUNTER — Ambulatory Visit (INDEPENDENT_AMBULATORY_CARE_PROVIDER_SITE_OTHER): Payer: Medicare Other | Admitting: Pharmacist

## 2016-04-08 DIAGNOSIS — Z5181 Encounter for therapeutic drug level monitoring: Secondary | ICD-10-CM | POA: Diagnosis not present

## 2016-04-08 DIAGNOSIS — I4891 Unspecified atrial fibrillation: Secondary | ICD-10-CM

## 2016-04-08 LAB — POCT INR: INR: 2

## 2016-04-10 DIAGNOSIS — C61 Malignant neoplasm of prostate: Secondary | ICD-10-CM | POA: Diagnosis not present

## 2016-04-15 DIAGNOSIS — H401113 Primary open-angle glaucoma, right eye, severe stage: Secondary | ICD-10-CM | POA: Diagnosis not present

## 2016-04-15 DIAGNOSIS — H401123 Primary open-angle glaucoma, left eye, severe stage: Secondary | ICD-10-CM | POA: Diagnosis not present

## 2016-04-16 DIAGNOSIS — M1711 Unilateral primary osteoarthritis, right knee: Secondary | ICD-10-CM | POA: Diagnosis not present

## 2016-04-17 DIAGNOSIS — N320 Bladder-neck obstruction: Secondary | ICD-10-CM | POA: Diagnosis not present

## 2016-04-17 DIAGNOSIS — C61 Malignant neoplasm of prostate: Secondary | ICD-10-CM | POA: Diagnosis not present

## 2016-05-08 DIAGNOSIS — Z23 Encounter for immunization: Secondary | ICD-10-CM | POA: Diagnosis not present

## 2016-05-20 ENCOUNTER — Ambulatory Visit (INDEPENDENT_AMBULATORY_CARE_PROVIDER_SITE_OTHER): Payer: Medicare Other | Admitting: *Deleted

## 2016-05-20 DIAGNOSIS — Z5181 Encounter for therapeutic drug level monitoring: Secondary | ICD-10-CM

## 2016-05-20 DIAGNOSIS — I4891 Unspecified atrial fibrillation: Secondary | ICD-10-CM

## 2016-05-20 LAB — POCT INR: INR: 1.9

## 2016-06-17 ENCOUNTER — Ambulatory Visit (INDEPENDENT_AMBULATORY_CARE_PROVIDER_SITE_OTHER): Payer: Medicare Other | Admitting: *Deleted

## 2016-06-17 DIAGNOSIS — H401113 Primary open-angle glaucoma, right eye, severe stage: Secondary | ICD-10-CM | POA: Diagnosis not present

## 2016-06-17 DIAGNOSIS — H401123 Primary open-angle glaucoma, left eye, severe stage: Secondary | ICD-10-CM | POA: Diagnosis not present

## 2016-06-17 DIAGNOSIS — Z5181 Encounter for therapeutic drug level monitoring: Secondary | ICD-10-CM

## 2016-06-17 DIAGNOSIS — I4891 Unspecified atrial fibrillation: Secondary | ICD-10-CM | POA: Diagnosis not present

## 2016-06-17 LAB — POCT INR: INR: 2.3

## 2016-07-15 ENCOUNTER — Ambulatory Visit (INDEPENDENT_AMBULATORY_CARE_PROVIDER_SITE_OTHER): Payer: Medicare Other | Admitting: *Deleted

## 2016-07-15 DIAGNOSIS — Z5181 Encounter for therapeutic drug level monitoring: Secondary | ICD-10-CM

## 2016-07-15 DIAGNOSIS — I4891 Unspecified atrial fibrillation: Secondary | ICD-10-CM | POA: Diagnosis not present

## 2016-07-15 DIAGNOSIS — I25812 Atherosclerosis of bypass graft of coronary artery of transplanted heart without angina pectoris: Secondary | ICD-10-CM

## 2016-07-15 LAB — POCT INR: INR: 2.3

## 2016-07-17 ENCOUNTER — Other Ambulatory Visit: Payer: Self-pay | Admitting: Interventional Cardiology

## 2016-07-28 DIAGNOSIS — Z950 Presence of cardiac pacemaker: Secondary | ICD-10-CM | POA: Diagnosis not present

## 2016-07-28 DIAGNOSIS — I2510 Atherosclerotic heart disease of native coronary artery without angina pectoris: Secondary | ICD-10-CM | POA: Diagnosis not present

## 2016-07-28 DIAGNOSIS — M1711 Unilateral primary osteoarthritis, right knee: Secondary | ICD-10-CM | POA: Diagnosis not present

## 2016-07-28 DIAGNOSIS — I10 Essential (primary) hypertension: Secondary | ICD-10-CM | POA: Diagnosis not present

## 2016-07-28 DIAGNOSIS — I480 Paroxysmal atrial fibrillation: Secondary | ICD-10-CM | POA: Diagnosis not present

## 2016-07-28 DIAGNOSIS — E785 Hyperlipidemia, unspecified: Secondary | ICD-10-CM | POA: Diagnosis not present

## 2016-07-28 DIAGNOSIS — N183 Chronic kidney disease, stage 3 (moderate): Secondary | ICD-10-CM | POA: Diagnosis not present

## 2016-07-28 DIAGNOSIS — I739 Peripheral vascular disease, unspecified: Secondary | ICD-10-CM | POA: Diagnosis not present

## 2016-07-28 DIAGNOSIS — I5030 Unspecified diastolic (congestive) heart failure: Secondary | ICD-10-CM | POA: Diagnosis not present

## 2016-07-28 DIAGNOSIS — E559 Vitamin D deficiency, unspecified: Secondary | ICD-10-CM | POA: Diagnosis not present

## 2016-07-28 NOTE — Progress Notes (Signed)
Electrophysiology Office Note Date: 07/30/2016  ID:  Jonathan Warner, DOB 09/06/1931, MRN [id number]  PCP: Jonathan Screws, MD Primary Cardiologist: Jonathan Warner Electrophysiologist: [name]  CC: Pacemaker follow-up  Jonathan Warner is a 82 y.o. male seen today for Dr Jonathan Warner.  He presents today for routine electrophysiology followup.  Since last being seen in our clinic, the patient reports doing reasonably well.  He has chronic fatigue with exertion.   He denies chest pain, palpitations, dyspnea, PND, orthopnea, nausea, vomiting, dizziness, syncope, edema, weight gain, or early satiety.  Device History: MDT dual chamber PPM implanted 2004 for SSS; gen change 2013   Past Medical History:  Diagnosis Date  . Arthritis   . BPH (benign prostatic hyperplasia)   . CAD (coronary artery disease)    s/p CABG 1991  . Cataract    b/l surgery  . Chronic kidney disease (CKD), stage III (moderate)   . Diverticulosis   . DJD (degenerative joint disease)   . DJD (degenerative joint disease)   . GERD (gastroesophageal reflux disease)   . Glaucoma   . Hiatal hernia   . History of adenomatous polyp of colon 06/07/13   Last colonoscopy 06/27/13 showed small adenoma, no further testing due to advanced age.  Jonathan Warner History of radiation therapy 07/25/2012-09/20/2012   prostate 7800 cGy 40 sessions, seminal vesicles 5600 cGy 40 sessions  . History of shingles   . Hypercholesteremia   . Hyperlipidemia   . Hypertension   . Inguinal hernia   . Osteomyelitis of forearm, right, acute ([name])    drainage x 2  . Persistent atrial fibrillation ([name])   . Prostate cancer (Jonathan Warner) 04/27/12   Dr. Eulogio Warner. Radiation therapy. Adenocarcinoma,gleason=3+4=7, 3+3=6,PSA=4.93  volume=51cc  . Pulmonary nodules    small rlung base nodule 4.10m, scarring in lung bases  . Symptomatic bradycardia    s/p PPM by Dr [name]2004  . Undescended left testicle    absent left since birth   Past Surgical History:  Procedure Laterality  Date  . CARDIAC CATHETERIZATION  06/04/09   left w/noted patent bypass grafts  . CATARACT EXTRACTION W/ INTRAOCULAR LENS IMPLANT  2011  . COLONOSCOPY  12/21/2007   hx polyps, diverticulosis  . COLONOSCOPY WITH PROPOFOL N/A 06/27/2013   Procedure: COLONOSCOPY WITH PROPOFOL;  Surgeon: [name];  Location: [facility];  Service: Endoscopy;  Laterality: N/A;  . CORONARY ARTERY BYPASS GRAFT  1991   x5; prior MI  . PACEMAKER GENERATOR CHANGE  05/24/12  . PACEMAKER INSERTION  05/14/03   MDT EnPulse implanted by Dr ELeonia Reevesddd  . PERMANENT PACEMAKER GENERATOR CHANGE N/A 05/24/2012   Procedure: PERMANENT PACEMAKER GENERATOR CHANGE;  Surgeon: [name];  [facility];  Service: Cardiovascular;  Laterality: N/A;  . PROSTATE BIOPSY  04/27/12   Adenocarcinoma,gleason=3+3=6, 3+4,& 4+3,PSA=4.93,volume=51cc  . TUMOR REMOVAL     skin tumors removed    Current Outpatient Prescriptions  Medication Sig Dispense Refill  . acetaminophen (TYLENOL) 500 MG tablet Take 1,000 mg by mouth every 6 (six) hours as needed for pain.     .Jonathan KitchenamLODipine (NORVASC) 5 MG tablet Take 1 tablet (5 mg total) by mouth every morning. 90 tablet 0  . brimonidine (ALPHAGAN) 0.2 % ophthalmic solution Place 1 drop into both eyes 2 (two) times daily.    . dorzolamide (TRUSOPT) 2 % ophthalmic solution Place 1 drop into both eyes 2 (two) times daily.    . furosemide (LASIX) 40 MG tablet TAKE 1 AND  1/2 TABLETS BY MOUTH EVERY DAY 135 tablet 2  . latanoprost (XALATAN) 0.005 % ophthalmic solution Apply 1 drop to eye at bedtime.     . Multiple Vitamin (MULTIVITAMIN) tablet Take 1 tablet by mouth daily.    . nitroGLYCERIN (NITROSTAT) 0.4 MG SL tablet Place 1 tablet (0.4 mg total) under the tongue every 5 (five) minutes as needed for chest pain. 25 tablet 1  . omeprazole (PRILOSEC) 20 MG capsule Take 20 mg by mouth daily.    . potassium chloride SA (K-DUR,KLOR-CON) 20 MEQ tablet Take 1.5 tablets (30 mEq total) by mouth  daily. 135 tablet 0  . vitamin E 400 UNIT capsule Take 400 Units by mouth daily.    Jonathan Warner warfarin (COUMADIN) 2.5 MG tablet TAKE AS DIRECTED BY THE COUMADIN CLINIC 90 tablet 1   No current facility-administered medications for this visit.     Allergies:   Crestor [rosuvastatin]   Social History: Social History   Social History  . Marital status: Married    Spouse name: N/A  . Number of children: 1  . Years of education: N/A   Occupational History  . Retired     Chief Financial Officer   Social History Main Topics  . Smoking status: Former Smoker    Packs/day: 0.50    Years: 10.00    Types: Cigarettes    Quit date: 08/24/1981  . Smokeless tobacco: Never Used  . Alcohol use No  . Drug use: No  . Sexual activity: Not on file   Other Topics Concern  . Not on file   Social History Narrative   Retired Hotel manager.  Lives in [place].    Family History: Family History  Problem Relation Age of Onset  . Alzheimer's disease Father   . Hypertension Mother   . Congestive Heart Failure Mother   . CAD Mother   . CAD Brother   . Pancreatic cancer Brother   . CVA Brother   . Hypertension Sister     Has 6 sisters, many suffer from HTN  . Heart attack Brother   . Stroke Brother      Review of Systems: All other systems reviewed and are otherwise negative except as noted above.   Physical Exam: VS:  BP (!) 152/76   Pulse 93   Ht '5\' 9"'$  (1.753 m)   Wt 184 lb (83.5 kg)   BMI 27.17 kg/m  , BMI Body mass index is 27.17 kg/m.  GEN- The patient is elderly appearing, alert and oriented x 3 today.   HEENT: normocephalic, atraumatic; sclera clear, conjunctiva pink; hearing intact; oropharynx clear; neck supple  Lungs- Clear to ausculation bilaterally, normal work of breathing.  No wheezes, rales, rhonchi Heart- Irregular rate and rhythm  GI- soft, non-tender, non-distended, bowel sounds present  Extremities- no clubbing, cyanosis, or edema  MS- no  significant deformity or atrophy Skin- warm and dry, no rash or lesion; PPM pocket well healed Psych- euthymic mood, full affect Neuro- strength and sensation are intact  PPM Interrogation- reviewed in detail today,  See PACEART report  EKG:  EKG is not ordered today.  Recent Labs: 11/05/2015: Brain Natriuretic Peptide 222.3; BUN 13; Creat 1.13; Potassium 3.8; Sodium 139   Wt Readings from Last 3 Encounters:  07/30/16 184 lb (83.5 kg)  11/05/15 187 lb 1.9 oz (84.9 kg)  08/29/15 190 lb (86.2 kg)     Other studies Reviewed: Additional studies/ records that were reviewed today include: Dr Jonathan Warner and Dr Thompson Caul  office notes  Assessment and Plan:  1.  Symptomatic bradycardia Normal PPM function See Pace Art report No changes today  2.  Permanent atrial fibrillation Burden by device interrogation today 57.8% Continue Warfarin for CHADS2VASC of 4 V rates controlled Pt remains asymptomatic with atrial fibrillation  3.  CAD No recent ischemic symptoms  4.  HTN Stable No change required today   Current medicines are reviewed at length with the patient today.   The patient does not have concerns regarding his medicines.  The following changes were made today:  none  Labs/ tests ordered today include: none   Disposition:   Follow up with [facility] transmissions, Dr Jonathan Warner as scheduled, Dr Jonathan Warner in 1 year     Signed, Chanetta Marshall, NP 07/30/2016 8:42 AM  [facility] [address] [place] [address] [phone] (office) [phone] (fax)

## 2016-07-30 ENCOUNTER — Ambulatory Visit (INDEPENDENT_AMBULATORY_CARE_PROVIDER_SITE_OTHER): Payer: Medicare Other | Admitting: Nurse Practitioner

## 2016-07-30 DIAGNOSIS — R001 Bradycardia, unspecified: Secondary | ICD-10-CM | POA: Diagnosis not present

## 2016-07-30 DIAGNOSIS — I4819 Other persistent atrial fibrillation: Secondary | ICD-10-CM

## 2016-07-30 DIAGNOSIS — I481 Persistent atrial fibrillation: Secondary | ICD-10-CM | POA: Diagnosis not present

## 2016-07-30 DIAGNOSIS — I25812 Atherosclerosis of bypass graft of coronary artery of transplanted heart without angina pectoris: Secondary | ICD-10-CM

## 2016-07-30 LAB — CUP PACEART INCLINIC DEVICE CHECK
Date Time Interrogation Session: 20171207083217
Implantable Lead Implant Date: 20040920
Implantable Lead Implant Date: 20040920
Implantable Lead Location: 753859
Implantable Lead Location: 753860
Implantable Lead Model: 5076
Implantable Lead Model: 5092
Implantable Pulse Generator Implant Date: 20131001

## 2016-07-30 NOTE — Patient Instructions (Signed)
Medication Instructions:   Your physician recommends that you continue on your current medications as directed. Please refer to the Current Medication list given to you today.    If you need a refill on your cardiac medications before your next appointment, please call your pharmacy.  Labwork: NONE ORDERED  TODAY    Testing/Procedures: NONE ORDERED  TODAY    Follow-Up: Your physician wants you to follow-up in: [place] will receive a reminder letter in the mail two months in advance. If you don't receive a letter, please call our office to schedule the follow-up appointment.  Remote monitoring is used to monitor your Pacemaker of ICD from home. This monitoring reduces the number of office visits required to check your device to one time per year. It allows [place] to keep an eye on the functioning of your device to ensure it is working properly. You are scheduled for a device check from home on ..10/27/16..You may send your transmission at any time that day. If you have a wireless device, the transmission will be sent automatically. After your physician reviews your transmission, you will receive a postcard with your next transmission date.     Any Other Special Instructions Will Be Listed Below (If Applicable).

## 2016-08-12 ENCOUNTER — Ambulatory Visit (INDEPENDENT_AMBULATORY_CARE_PROVIDER_SITE_OTHER): Payer: Medicare Other | Admitting: *Deleted

## 2016-08-12 DIAGNOSIS — I25812 Atherosclerosis of bypass graft of coronary artery of transplanted heart without angina pectoris: Secondary | ICD-10-CM | POA: Diagnosis not present

## 2016-08-12 DIAGNOSIS — Z5181 Encounter for therapeutic drug level monitoring: Secondary | ICD-10-CM

## 2016-08-12 DIAGNOSIS — I4891 Unspecified atrial fibrillation: Secondary | ICD-10-CM | POA: Diagnosis not present

## 2016-08-12 LAB — POCT INR: INR: 2.6

## 2016-09-04 ENCOUNTER — Other Ambulatory Visit: Payer: Self-pay | Admitting: Interventional Cardiology

## 2016-09-23 ENCOUNTER — Ambulatory Visit (INDEPENDENT_AMBULATORY_CARE_PROVIDER_SITE_OTHER): Payer: Medicare Other | Admitting: *Deleted

## 2016-09-23 DIAGNOSIS — Z5181 Encounter for therapeutic drug level monitoring: Secondary | ICD-10-CM | POA: Diagnosis not present

## 2016-09-23 DIAGNOSIS — I4891 Unspecified atrial fibrillation: Secondary | ICD-10-CM | POA: Diagnosis not present

## 2016-09-23 LAB — POCT INR: INR: 2.3

## 2016-10-12 DIAGNOSIS — H401133 Primary open-angle glaucoma, bilateral, severe stage: Secondary | ICD-10-CM | POA: Diagnosis not present

## 2016-10-19 ENCOUNTER — Other Ambulatory Visit: Payer: Self-pay | Admitting: Interventional Cardiology

## 2016-10-29 ENCOUNTER — Ambulatory Visit (INDEPENDENT_AMBULATORY_CARE_PROVIDER_SITE_OTHER): Payer: Medicare Other | Admitting: *Deleted

## 2016-10-29 ENCOUNTER — Telehealth: Payer: Self-pay | Admitting: Cardiology

## 2016-10-29 DIAGNOSIS — I495 Sick sinus syndrome: Secondary | ICD-10-CM

## 2016-10-29 NOTE — Telephone Encounter (Signed)
Spoke with pt and reminded pt of remote transmission that is due today. Pt verbalized understanding.   

## 2016-10-29 NOTE — Progress Notes (Signed)
Remote pacemaker transmission.   

## 2016-10-30 ENCOUNTER — Encounter: Payer: Self-pay | Admitting: Cardiology

## 2016-10-30 LAB — CUP PACEART REMOTE DEVICE CHECK
Battery Impedance: 227 Ohm
Battery Remaining Longevity: 118 mo
Battery Voltage: 2.79 V
Brady Statistic AP VP Percent: 3 %
Brady Statistic AP VS Percent: 0 %
Brady Statistic AS VP Percent: 2 %
Brady Statistic AS VS Percent: 95 %
Date Time Interrogation Session: 20180308170120
Implantable Lead Implant Date: 20040920
Implantable Lead Implant Date: 20040920
Implantable Lead Location: 753859
Implantable Lead Location: 753860
Implantable Lead Model: 5076
Implantable Lead Model: 5092
Implantable Pulse Generator Implant Date: 20131001
Lead Channel Impedance Value: 481 Ohm
Lead Channel Impedance Value: 629 Ohm
Lead Channel Pacing Threshold Amplitude: 0.625 V
Lead Channel Pacing Threshold Amplitude: 1.5 V
Lead Channel Pacing Threshold Pulse Width: 0.4 ms
Lead Channel Pacing Threshold Pulse Width: 0.4 ms
Lead Channel Setting Pacing Amplitude: 2 V
Lead Channel Setting Pacing Amplitude: 2 V
Lead Channel Setting Pacing Pulse Width: 0.4 ms
Lead Channel Setting Sensing Sensitivity: 4 mV

## 2016-11-06 ENCOUNTER — Ambulatory Visit (INDEPENDENT_AMBULATORY_CARE_PROVIDER_SITE_OTHER): Payer: Medicare Other | Admitting: Interventional Cardiology

## 2016-11-06 ENCOUNTER — Ambulatory Visit (INDEPENDENT_AMBULATORY_CARE_PROVIDER_SITE_OTHER): Payer: Medicare Other | Admitting: *Deleted

## 2016-11-06 ENCOUNTER — Encounter: Payer: Self-pay | Admitting: Interventional Cardiology

## 2016-11-06 VITALS — BP 140/76 | HR 83 | Ht 67.0 in | Wt 182.0 lb

## 2016-11-06 DIAGNOSIS — I482 Chronic atrial fibrillation, unspecified: Secondary | ICD-10-CM

## 2016-11-06 DIAGNOSIS — I739 Peripheral vascular disease, unspecified: Secondary | ICD-10-CM

## 2016-11-06 DIAGNOSIS — I4891 Unspecified atrial fibrillation: Secondary | ICD-10-CM | POA: Diagnosis not present

## 2016-11-06 DIAGNOSIS — I25701 Atherosclerosis of coronary artery bypass graft(s), unspecified, with angina pectoris with documented spasm: Secondary | ICD-10-CM

## 2016-11-06 DIAGNOSIS — I209 Angina pectoris, unspecified: Secondary | ICD-10-CM

## 2016-11-06 DIAGNOSIS — Z7901 Long term (current) use of anticoagulants: Secondary | ICD-10-CM

## 2016-11-06 DIAGNOSIS — I130 Hypertensive heart and chronic kidney disease with heart failure and stage 1 through stage 4 chronic kidney disease, or unspecified chronic kidney disease: Secondary | ICD-10-CM

## 2016-11-06 DIAGNOSIS — I25119 Atherosclerotic heart disease of native coronary artery with unspecified angina pectoris: Secondary | ICD-10-CM | POA: Insufficient documentation

## 2016-11-06 DIAGNOSIS — Z5181 Encounter for therapeutic drug level monitoring: Secondary | ICD-10-CM

## 2016-11-06 LAB — POCT INR: INR: 2.6

## 2016-11-06 NOTE — Patient Instructions (Signed)
Medication Instructions:  None  Labwork: None  Testing/Procedures: Your physician has requested that you have a lower extremity arterial duplex. During this test, an ultrasound is used to evaluate arterial blood flow in the legs. Allow one hour for this exam. There are no restrictions or special instructions.   Follow-Up: Your physician wants you to follow-up in: 1 year with Dr. [name].  You will receive a reminder letter in the mail two months in advance. If you don't receive a letter, please call our office to schedule the follow-up appointment.   Any Other Special Instructions Will Be Listed Below (If Applicable).     If you need a refill on your cardiac medications before your next appointment, please call your pharmacy.

## 2016-11-06 NOTE — Progress Notes (Signed)
Cardiology Office Note    Date:  11/06/2016   ID:  Jonathan Warner, DOB 1932/04/18, MRN [id number]  PCP:  [name], MD  Cardiologist: Sinclair Grooms, MD   Chief Complaint  Patient presents with  . Coronary Artery Disease  . Atrial Fibrillation    History of Present Illness:  Jonathan Warner is a 83 y.o. male who presents for Follow-up of coronary artery disease with bypass surgery 1991, atrial fibrillation, tachycardia-bradycardia syndrome with Medtronic DDD pacemaker, hyperlipidemia, and chronic limiting osteoarthritis.  He denies angina. Life is becoming more more sedentary. The major issue is right knee discomfort and arthritis. He denies angina. He has not needed to use nitroglycerin. He has no lower extremity edema. Feet feel numb.   Past Medical History:  Diagnosis Date  . Arthritis   . BPH (benign prostatic hyperplasia)   . CAD (coronary artery disease)    s/p CABG 1991  . Cataract    b/l surgery  . Chronic kidney disease (CKD), stage III (moderate)   . Diverticulosis   . DJD (degenerative joint disease)   . DJD (degenerative joint disease)   . GERD (gastroesophageal reflux disease)   . Glaucoma   . Hiatal hernia   . History of adenomatous polyp of colon 06/07/13   Last colonoscopy 06/27/13 showed small adenoma, no further testing due to advanced age.  [name] History of radiation therapy 07/25/2012-09/20/2012   prostate 7800 cGy 40 sessions, seminal vesicles 5600 cGy 40 sessions  . History of shingles   . Hypercholesteremia   . Hyperlipidemia   . Hypertension   . Inguinal hernia   . Osteomyelitis of forearm, right, acute ([place])    drainage x 2  . Persistent atrial fibrillation ([place])   . Prostate cancer ([place]) 04/27/12   Dr. Eulogio Ditch. Radiation therapy. Adenocarcinoma,gleason=3+4=7, 3+3=6,PSA=4.93  volume=51cc  . Pulmonary nodules    small rlung base nodule 4.49m, scarring in lung bases  . Symptomatic bradycardia    s/p PPM by Dr [name]2004  . Undescended  left testicle    absent left since birth    Past Surgical History:  Procedure Laterality Date  . CARDIAC CATHETERIZATION  06/04/09   left w/noted patent bypass grafts  . CATARACT EXTRACTION W/ INTRAOCULAR LENS IMPLANT  2011  . COLONOSCOPY  12/21/2007   hx polyps, diverticulosis  . COLONOSCOPY WITH PROPOFOL N/A 06/27/2013   Procedure: COLONOSCOPY WITH PROPOFOL;  Surgeon: [name];  Location: [facility];  Service: Endoscopy;  Laterality: N/A;  . CORONARY ARTERY BYPASS GRAFT  1991   x5; prior MI  . PACEMAKER GENERATOR CHANGE  05/24/12  . PACEMAKER INSERTION  05/14/03   MDT EnPulse implanted by Dr ELeonia Reevesddd  . PERMANENT PACEMAKER GENERATOR CHANGE N/A 05/24/2012   Procedure: PERMANENT PACEMAKER GENERATOR CHANGE;  Surgeon: [name];  [facility];  Service: Cardiovascular;  Laterality: N/A;  . PROSTATE BIOPSY  04/27/12   Adenocarcinoma,gleason=3+3=6, 3+4,& 4+3,PSA=4.93,volume=51cc  . TUMOR REMOVAL     skin tumors removed    Current Medications: Outpatient Medications Prior to Visit  Medication Sig Dispense Refill  . acetaminophen (TYLENOL) 500 MG tablet Take 1,000 mg by mouth every 6 (six) hours as needed for pain.     .[name] KitchenamLODipine (NORVASC) 5 MG tablet TAKE 1 TABLET EVERY MORNING 90 tablet 2  . brimonidine (ALPHAGAN) 0.2 % ophthalmic solution Place 1 drop into both eyes 2 (two) times daily.    . dorzolamide (TRUSOPT) 2 % ophthalmic solution  Place 1 drop into both eyes 2 (two) times daily.    . furosemide (LASIX) 40 MG tablet Take 1.5 tablets (60 mg total) by mouth daily. 135 tablet 0  . latanoprost (XALATAN) 0.005 % ophthalmic solution Apply 1 drop to eye at bedtime.     . Multiple Vitamin (MULTIVITAMIN) tablet Take 1 tablet by mouth daily.    . nitroGLYCERIN (NITROSTAT) 0.4 MG SL tablet Place 1 tablet (0.4 mg total) under the tongue every 5 (five) minutes as needed for chest pain. 25 tablet 1  . omeprazole (PRILOSEC) 20 MG capsule Take 20 mg by mouth daily.     . potassium chloride SA (K-DUR,KLOR-CON) 20 MEQ tablet TAKE 1.5 TABLETS BY MOUTH DAILY 135 tablet 2  . vitamin E 400 UNIT capsule Take 400 Units by mouth daily.    [name] warfarin (COUMADIN) 2.5 MG tablet TAKE AS DIRECTED BY THE COUMADIN CLINIC 90 tablet 1   No facility-administered medications prior to visit.      Allergies:   Crestor [rosuvastatin]   Social History   Social History  . Marital status: Married    Spouse name: N/A  . Number of children: 1  . Years of education: N/A   Occupational History  . Retired     Chief Financial Officer   Social History Main Topics  . Smoking status: Former Smoker    Packs/day: 0.50    Years: 10.00    Types: Cigarettes    Quit date: 08/24/1981  . Smokeless tobacco: Never Used  . Alcohol use No  . Drug use: No  . Sexual activity: Not Asked   Other Topics Concern  . 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     Family History:  The patient's family history includes Alzheimer's disease in his father; CAD in his brother and mother; CVA in his brother; Congestive Heart Failure in his mother; Heart attack in his brother; Hypertension in his mother and sister; Pancreatic cancer in his brother; Stroke in his brother.   ROS:   Please see the history of present illness.    No neurological complaints. Overall he is stable and compliant with the medical regimen.  All other systems reviewed and are negative.   PHYSICAL EXAM:   VS:  BP 140/76 (BP Location: Left Arm)   Pulse 83   Ht '5\' 7"'$  (1.702 m)   Wt 182 lb (82.6 kg)   BMI 28.51 kg/m    GEN: Well nourished, well developed, in no acute distress  HEENT: normal  Neck: no JVD, carotid bruits, or masses Cardiac: RRR; no murmurs, rubs, or gallops,no edema . Absent pedal pulses in the right lower extremity. Respiratory:  clear to auscultation bilaterally, normal work of breathing GI: soft, nontender, nondistended, + BS MS: no deformity  or atrophy  Skin: warm and dry, no rash Neuro:  Alert and Oriented x 3, Strength and sensation are intact Psych: euthymic mood, full affect  Wt Readings from Last 3 Encounters:  11/06/16 182 lb (82.6 kg)  07/30/16 184 lb (83.5 kg)  11/05/15 187 lb 1.9 oz (84.9 kg)      Studies/Labs Reviewed:   EKG:  EKG  Atrial fibrillation. Ventricular pacing at 83 bpm.  Recent Labs: No results found for requested labs within last 8760 hours.   Lipid Panel    Component Value Date/Time   CHOL 161 06/14/2013 0955   TRIG 250.0 (H) 06/14/2013 0955   HDL 34.70 (L) 06/14/2013  0955   CHOLHDL 5 06/14/2013 0955   VLDL 50.0 (H) 06/14/2013 0955   LDLDIRECT 83.5 06/14/2013 0955    Additional studies/ records that were reviewed today include:  Diffuse atherosclerosis was noted on the lower extremity Doppler study performed in 2014.    ASSESSMENT:    1. Chronic atrial fibrillation ([place])   2. Coronary artery disease involving coronary bypass graft with angina pectoris with documented spasm, unspecified whether native or transplanted heart ([place])   3. Chronic anticoagulation   4. Hypertensive heart and chronic kidney disease with heart failure and stage 1 through stage 4 chronic kidney disease, or chronic kidney disease ([place])   5. PAD (peripheral artery disease) ([facility])      PLAN:  In order of problems listed above:  1. Chronic atrial fibrillation with ventricular pacing noted 2. Asymptomatic with reference to angina or other planes that would be ischemic equivalent. 3. No bleeding, occasions on warfarin. 4. Blood pressure control is adequate. No evidence of volume overload. 5. Absent pulse in the right lower extremity. Reassess vascular flow in bilateral lower extremities and compared to study done 4 years ago.  Overall stable. Contemplating right knee surgery. Decreased blood flow in the right foot. His surgery is performed on the right lower extremity he could have impaired healing. I will  otherwise plan to see him back in one year unless new cardiac complaints. No medication changes.  Medication Adjustments/Labs and Tests Ordered: Current medicines are reviewed at length with the patient today.  Concerns regarding medicines are outlined above.  Medication changes, Labs and Tests ordered today are listed in the Patient Instructions below. Patient Instructions  Medication Instructions:  None  Labwork: None  Testing/Procedures: Your physician has requested that you have a lower extremity arterial duplex. During this test, an ultrasound is used to evaluate arterial blood flow in the legs. Allow one hour for this exam. There are no restrictions or special instructions.   Follow-Up: Your physician wants you to follow-up in: 1 year with Dr. [name].  You will receive a reminder letter in the mail two months in advance. If you don't receive a letter, please call our office to schedule the follow-up appointment.   Any Other Special Instructions Will Be Listed Below (If Applicable).     If you need a refill on your cardiac medications before your next appointment, please call your pharmacy.      Signed, Sinclair Grooms, MD  11/06/2016 2:09 PM    [facility] [address], [place], [place]  [address] Phone: [phone]; Fax: [phone]

## 2016-11-10 ENCOUNTER — Other Ambulatory Visit: Payer: Self-pay | Admitting: Interventional Cardiology

## 2016-11-10 DIAGNOSIS — R0989 Other specified symptoms and signs involving the circulatory and respiratory systems: Secondary | ICD-10-CM

## 2016-11-20 ENCOUNTER — Ambulatory Visit (HOSPITAL_COMMUNITY)
Admission: RE | Admit: 2016-11-20 | Discharge: 2016-11-20 | Disposition: A | Discharge status: Home / Self Care | Payer: Medicare Other | Source: Ambulatory Visit | Attending: Internal Medicine | Admitting: Internal Medicine

## 2016-11-20 DIAGNOSIS — R0989 Other specified symptoms and signs involving the circulatory and respiratory systems: Secondary | ICD-10-CM | POA: Insufficient documentation

## 2016-11-20 DIAGNOSIS — I739 Peripheral vascular disease, unspecified: Secondary | ICD-10-CM | POA: Insufficient documentation

## 2016-12-01 ENCOUNTER — Other Ambulatory Visit: Payer: Self-pay | Admitting: Interventional Cardiology

## 2016-12-24 ENCOUNTER — Ambulatory Visit (INDEPENDENT_AMBULATORY_CARE_PROVIDER_SITE_OTHER): Payer: Medicare Other

## 2016-12-24 DIAGNOSIS — I4891 Unspecified atrial fibrillation: Secondary | ICD-10-CM

## 2016-12-24 DIAGNOSIS — Z5181 Encounter for therapeutic drug level monitoring: Secondary | ICD-10-CM

## 2016-12-24 DIAGNOSIS — I25701 Atherosclerosis of coronary artery bypass graft(s), unspecified, with angina pectoris with documented spasm: Secondary | ICD-10-CM

## 2016-12-24 LAB — POCT INR: INR: 2.4

## 2016-12-28 DIAGNOSIS — M1711 Unilateral primary osteoarthritis, right knee: Secondary | ICD-10-CM | POA: Diagnosis not present

## 2017-01-04 ENCOUNTER — Other Ambulatory Visit: Payer: Self-pay | Admitting: Interventional Cardiology

## 2017-01-04 MED ORDER — FUROSEMIDE 40 MG PO TABS: ORAL_TABLET | ORAL | 0 refills | Status: DC

## 2017-01-04 MED ORDER — FUROSEMIDE 40 MG PO TABS: ORAL_TABLET | ORAL | 2 refills | Status: DC

## 2017-01-06 DIAGNOSIS — M1712 Unilateral primary osteoarthritis, left knee: Secondary | ICD-10-CM | POA: Diagnosis not present

## 2017-01-13 DIAGNOSIS — M1711 Unilateral primary osteoarthritis, right knee: Secondary | ICD-10-CM | POA: Diagnosis not present

## 2017-01-13 DIAGNOSIS — H401113 Primary open-angle glaucoma, right eye, severe stage: Secondary | ICD-10-CM | POA: Diagnosis not present

## 2017-01-13 DIAGNOSIS — H401123 Primary open-angle glaucoma, left eye, severe stage: Secondary | ICD-10-CM | POA: Diagnosis not present

## 2017-01-22 ENCOUNTER — Other Ambulatory Visit: Payer: Self-pay | Admitting: Internal Medicine

## 2017-01-22 DIAGNOSIS — Z79899 Other long term (current) drug therapy: Secondary | ICD-10-CM | POA: Diagnosis not present

## 2017-01-22 DIAGNOSIS — E559 Vitamin D deficiency, unspecified: Secondary | ICD-10-CM | POA: Diagnosis not present

## 2017-01-22 DIAGNOSIS — Z950 Presence of cardiac pacemaker: Secondary | ICD-10-CM | POA: Diagnosis not present

## 2017-01-22 DIAGNOSIS — R0989 Other specified symptoms and signs involving the circulatory and respiratory systems: Secondary | ICD-10-CM | POA: Diagnosis not present

## 2017-01-22 DIAGNOSIS — I2510 Atherosclerotic heart disease of native coronary artery without angina pectoris: Secondary | ICD-10-CM | POA: Diagnosis not present

## 2017-01-22 DIAGNOSIS — N183 Chronic kidney disease, stage 3 (moderate): Secondary | ICD-10-CM | POA: Diagnosis not present

## 2017-01-22 DIAGNOSIS — I5030 Unspecified diastolic (congestive) heart failure: Secondary | ICD-10-CM | POA: Diagnosis not present

## 2017-01-22 DIAGNOSIS — I10 Essential (primary) hypertension: Secondary | ICD-10-CM | POA: Diagnosis not present

## 2017-01-22 DIAGNOSIS — Z1389 Encounter for screening for other disorder: Secondary | ICD-10-CM | POA: Diagnosis not present

## 2017-01-22 DIAGNOSIS — I480 Paroxysmal atrial fibrillation: Secondary | ICD-10-CM | POA: Diagnosis not present

## 2017-01-22 DIAGNOSIS — I739 Peripheral vascular disease, unspecified: Secondary | ICD-10-CM | POA: Diagnosis not present

## 2017-01-22 DIAGNOSIS — E785 Hyperlipidemia, unspecified: Secondary | ICD-10-CM | POA: Diagnosis not present

## 2017-01-22 DIAGNOSIS — Z0001 Encounter for general adult medical examination with abnormal findings: Secondary | ICD-10-CM | POA: Diagnosis not present

## 2017-01-26 DIAGNOSIS — T1512XA Foreign body in conjunctival sac, left eye, initial encounter: Secondary | ICD-10-CM | POA: Diagnosis not present

## 2017-01-27 ENCOUNTER — Ambulatory Visit
Admission: RE | Admit: 2017-01-27 | Discharge: 2017-01-27 | Disposition: A | Discharge status: Home / Self Care | Payer: Medicare Other | Source: Ambulatory Visit | Attending: Internal Medicine | Admitting: Internal Medicine

## 2017-01-27 DIAGNOSIS — I6522 Occlusion and stenosis of left carotid artery: Secondary | ICD-10-CM | POA: Diagnosis not present

## 2017-01-27 DIAGNOSIS — R0989 Other specified symptoms and signs involving the circulatory and respiratory systems: Secondary | ICD-10-CM

## 2017-01-28 ENCOUNTER — Ambulatory Visit (INDEPENDENT_AMBULATORY_CARE_PROVIDER_SITE_OTHER): Payer: Medicare Other | Admitting: *Deleted

## 2017-01-28 DIAGNOSIS — I495 Sick sinus syndrome: Secondary | ICD-10-CM | POA: Diagnosis not present

## 2017-01-29 LAB — CUP PACEART REMOTE DEVICE CHECK
Battery Impedance: 275 Ohm
Battery Remaining Longevity: 111 mo
Battery Voltage: 2.79 V
Brady Statistic AP VP Percent: 4 %
Brady Statistic AP VS Percent: 0 %
Brady Statistic AS VP Percent: 2 %
Brady Statistic AS VS Percent: 94 %
Date Time Interrogation Session: 20180607123740
Implantable Lead Implant Date: 20040920
Implantable Lead Implant Date: 20040920
Implantable Lead Location: 753859
Implantable Lead Location: 753860
Implantable Lead Model: 5076
Implantable Lead Model: 5092
Implantable Pulse Generator Implant Date: 20131001
Lead Channel Impedance Value: 468 Ohm
Lead Channel Impedance Value: 612 Ohm
Lead Channel Pacing Threshold Amplitude: 0.5 V
Lead Channel Pacing Threshold Amplitude: 1.5 V
Lead Channel Pacing Threshold Pulse Width: 0.4 ms
Lead Channel Pacing Threshold Pulse Width: 0.4 ms
Lead Channel Sensing Intrinsic Amplitude: 0.7 mV
Lead Channel Sensing Intrinsic Amplitude: 8 mV
Lead Channel Setting Pacing Amplitude: 2 V
Lead Channel Setting Pacing Amplitude: 2 V
Lead Channel Setting Pacing Pulse Width: 0.4 ms
Lead Channel Setting Sensing Sensitivity: 4 mV

## 2017-01-29 NOTE — Progress Notes (Signed)
Remote pacemaker transmission.   

## 2017-02-02 ENCOUNTER — Other Ambulatory Visit: Payer: Self-pay | Admitting: *Deleted

## 2017-02-02 DIAGNOSIS — I6522 Occlusion and stenosis of left carotid artery: Secondary | ICD-10-CM

## 2017-02-03 ENCOUNTER — Encounter: Payer: Self-pay | Admitting: Cardiology

## 2017-02-04 ENCOUNTER — Ambulatory Visit (INDEPENDENT_AMBULATORY_CARE_PROVIDER_SITE_OTHER): Payer: Medicare Other | Admitting: *Deleted

## 2017-02-04 DIAGNOSIS — I6522 Occlusion and stenosis of left carotid artery: Secondary | ICD-10-CM | POA: Diagnosis not present

## 2017-02-04 DIAGNOSIS — Z5181 Encounter for therapeutic drug level monitoring: Secondary | ICD-10-CM | POA: Diagnosis not present

## 2017-02-04 DIAGNOSIS — I4891 Unspecified atrial fibrillation: Secondary | ICD-10-CM | POA: Diagnosis not present

## 2017-02-04 LAB — POCT INR: INR: 2.7

## 2017-02-23 DIAGNOSIS — H5213 Myopia, bilateral: Secondary | ICD-10-CM | POA: Diagnosis not present

## 2017-02-23 DIAGNOSIS — Z961 Presence of intraocular lens: Secondary | ICD-10-CM | POA: Diagnosis not present

## 2017-03-03 ENCOUNTER — Encounter: Payer: Self-pay | Admitting: Vascular Surgery

## 2017-03-15 ENCOUNTER — Encounter (HOSPITAL_COMMUNITY): Payer: Medicare Other

## 2017-03-17 ENCOUNTER — Encounter: Payer: Self-pay | Admitting: Vascular Surgery

## 2017-03-17 ENCOUNTER — Ambulatory Visit (INDEPENDENT_AMBULATORY_CARE_PROVIDER_SITE_OTHER): Payer: Medicare Other | Admitting: Vascular Surgery

## 2017-03-17 ENCOUNTER — Ambulatory Visit (HOSPITAL_COMMUNITY)
Admission: RE | Admit: 2017-03-17 | Discharge: 2017-03-17 | Disposition: A | Discharge status: Home / Self Care | Payer: Medicare Other | Source: Ambulatory Visit | Attending: Vascular Surgery | Admitting: Vascular Surgery

## 2017-03-17 VITALS — BP 133/72 | HR 72 | Temp 97.2°F | Resp 20 | Ht 67.0 in | Wt 185.0 lb

## 2017-03-17 DIAGNOSIS — I6523 Occlusion and stenosis of bilateral carotid arteries: Secondary | ICD-10-CM | POA: Diagnosis not present

## 2017-03-17 DIAGNOSIS — I6522 Occlusion and stenosis of left carotid artery: Secondary | ICD-10-CM | POA: Insufficient documentation

## 2017-03-17 LAB — VAS US CAROTID
LEFT ECA DIAS: -14 cm/s
Left CCA dist dias: 16 cm/s
Left CCA dist sys: 73 cm/s
Left CCA prox dias: 13 cm/s
Left CCA prox sys: 90 cm/s
Left ICA dist dias: -14 cm/s
Left ICA dist sys: -55 cm/s
Left ICA prox dias: 67 cm/s
Left ICA prox sys: 244 cm/s

## 2017-03-17 NOTE — Progress Notes (Signed)
Patient name: Jonathan Warner MRN: [id number] DOB: 11/12/1931 Sex: male   REASON FOR CONSULT:    Left carotid stenosis. The consult is requested by Dr. Inda Merlin.  HPI:   Jonathan Warner is a pleasant 83 y.o. male,  Who was found to have a carotid bruit. This prompted a carotid duplex scan which suggested a greater than 70% left carotid stenosis. He comes in for a vascular evaluation.  The patient is left-handed. He denies any previous history of stroke, TIAs, expressive or receptive aphasia, or amaurosis fugax. He is not on aspirin because he is on Coumadin for atrial fibrillation. He is not on a statin because he had muscle problems previously while on a statin.  He does have a history of coronary artery disease and his cardiologist is Dr. Daneen Schick. He denies any recent cardiac problems.  I did review the records that were sent from the referring office. He was seen on 01/22/2017 for a yearly wellness exam. He has essential hypertension which is under good control. He has osteoarthritis of the right knee. He has paroxysmal atrial fibrillation and is on Coumadin. It is also noted that he has a history of diastolic congestive heart failure. He has stage III chronic kidney disease. His GFR was 59 at that time.  Past Medical History:  Diagnosis Date  . Arthritis   . BPH (benign prostatic hyperplasia)   . CAD (coronary artery disease)    s/p CABG 1991  . Carotid artery occlusion   . Cataract    b/l surgery  . Chronic kidney disease (CKD), stage III (moderate)   . Diverticulosis   . DJD (degenerative joint disease)   . DJD (degenerative joint disease)   . GERD (gastroesophageal reflux disease)   . Glaucoma   . Hiatal hernia   . History of adenomatous polyp of colon 06/07/13   Last colonoscopy 06/27/13 showed small adenoma, no further testing due to advanced age.  [name] History of radiation therapy 07/25/2012-09/20/2012   prostate 7800 cGy 40 sessions, seminal vesicles 5600 cGy 40 sessions  .  History of shingles   . Hypercholesteremia   . Hyperlipidemia   . Hypertension   . Inguinal hernia   . Osteomyelitis of forearm, right, acute ([place])    drainage x 2  . Persistent atrial fibrillation ([place])   . Prostate cancer ([place]) 04/27/12   Dr. Eulogio Ditch. Radiation therapy. Adenocarcinoma,gleason=3+4=7, 3+3=6,PSA=4.93  volume=51cc  . Pulmonary nodules    small rlung base nodule 4.92mm, scarring in lung bases  . 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 Hypertension Mother   . Congestive Heart Failure Mother   . CAD Mother   . Alzheimer's disease Father   . CAD Brother   . Pancreatic cancer Brother   . CVA Brother   . Hypertension Sister        Has 6 sisters, many suffer from HTN  . Heart attack Brother   . Stroke Brother     SOCIAL HISTORY: Social History   Social History  . Marital status: Married    Spouse name: N/A  . Number of children: 1  . Years of education: N/A   Occupational History  . Retired     Chief Financial Officer   Social History Main Topics  . Smoking status: Former Smoker  Packs/day: 0.50    Years: 10.00    Types: Cigarettes    Quit date: 08/24/1981  . Smokeless tobacco: Never Used  . Alcohol use No  . Drug use: No  . Sexual activity: Not on file   Other Topics Concern  . 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    Allergies  Allergen Reactions  . Crestor [Rosuvastatin]     Knee and leg pain    Current Outpatient Prescriptions  Medication Sig Dispense Refill  . acetaminophen (TYLENOL) 500 MG tablet Take 1,000 mg by mouth every 6 (six) hours as needed for pain.     [name] amLODipine (NORVASC) 5 MG tablet TAKE 1 TABLET EVERY MORNING 90 tablet 2  . brimonidine (ALPHAGAN) 0.2 % ophthalmic solution Place 1 drop into both eyes 3 (three) times daily.     .  Cholecalciferol (VITAMIN D3) 2000 units TABS Take by mouth.    . dorzolamide (TRUSOPT) 2 % ophthalmic solution Place 1 drop into both eyes 3 (three) times daily.     . furosemide (LASIX) 40 MG tablet TAKE 1 AND 1/2 TABLETS EVERY DAY 135 tablet 2  . latanoprost (XALATAN) 0.005 % ophthalmic solution Apply 1 drop to eye at bedtime.     . Multiple Vitamin (MULTIVITAMIN) tablet Take 1 tablet by mouth daily.    . nitroGLYCERIN (NITROSTAT) 0.4 MG SL tablet Place 1 tablet (0.4 mg total) under the tongue every 5 (five) minutes as needed for chest pain. 25 tablet 1  . omeprazole (PRILOSEC) 20 MG capsule Take 20 mg by mouth daily.    . potassium chloride SA (K-DUR,KLOR-CON) 20 MEQ tablet TAKE 1.5 TABLETS BY MOUTH DAILY 135 tablet 2  . vitamin E 400 UNIT capsule Take 400 Units by mouth daily.    [name] warfarin (COUMADIN) 2.5 MG tablet TAKE AS DIRECTED BY THE COUMADIN CLINIC 90 tablet 1   No current facility-administered medications for this visit.     REVIEW OF SYSTEMS:  [X]  denotes positive finding, [ ]  denotes negative finding Cardiac  Comments:  Chest pain or chest pressure:    Shortness of breath upon exertion:    Short of breath when lying flat:    Irregular heart rhythm:        Vascular    Pain in calf, thigh, or hip brought on by ambulation:    Pain in feet at night that wakes you up from your sleep:     Blood clot in your veins:    Leg swelling:         Pulmonary    Oxygen at home:    Productive cough:     Wheezing:         Neurologic    Sudden weakness in arms or legs:     Sudden numbness in arms or legs:     Sudden onset of difficulty speaking or slurred speech:    Temporary loss of vision in one eye:     Problems with dizziness:         Gastrointestinal    Blood in stool:     Vomited blood:         Genitourinary    Burning when urinating:     Blood in urine:        Psychiatric    Major depression:         Hematologic    Bleeding problems:    Problems with blood  clotting  too easily:        Skin    Rashes or ulcers:        Constitutional    Fever or chills:     PHYSICAL EXAM:   Vitals:   03/17/17 0830 03/17/17 0832  BP: (!) 146/80 133/72  Pulse: 72   Resp: 20   Temp: (!) 97.2 F (36.2 C)   TempSrc: Oral   SpO2: 96%   Weight: 185 lb (83.9 kg)   Height: 5\' 7"  (1.702 m)     GENERAL: The patient is a well-nourished male, in no acute distress. The vital signs are documented above. CARDIAC: There is a regular rate and rhythm.  VASCULAR: I do not detect carotid bruits. He has palpable femoral and posterior tibial pulses bilaterally. He has hyperpigmentation bilaterally consistent with chronic venous insufficiency. PULMONARY: There is good air exchange bilaterally without wheezing or rales. ABDOMEN: Soft and non-tender with normal pitched bowel sounds. I do not palpate an abdominal aortic aneurysm. MUSCULOSKELETAL: There are no major deformities or cyanosis. NEUROLOGIC: No focal weakness or paresthesias are detected. SKIN: There are no ulcers or rashes noted. PSYCHIATRIC: The patient has a normal affect.  DATA:    CAROTID DUPLEX: I did review the carotid duplex scan that was done by [facility] on 01/27/2017. This suggested a greater than 70% left carotid stenosis with a 50-69% right carotid stenosis. However the velocities on the left were a peak systolic velocity of 163 cm/s with end-diastolic velocity of 36 cm/s. This would suggest a 60-79% left carotid stenosis.  A have independently interpreted a limited carotid duplex scan in our office today which shows that the left carotid stenosis is in the 60-79% range. End diastolic velocity was 67 cm/s. Clearly the stenosis on the left is less than 80%.  MEDICAL ISSUES:   ASYMPTOMATIC BILATERAL CAROTID DISEASE: This patient has a 60-79% left carotid stenosis and a 50-69% right carotid stenosis. He is asymptomatic. I have explained that we would not consider carotid endarterectomy unless  the stenosis progressed to greater than 80% or he develop new neurologic symptoms. I encouraged him to stay as active as possible and also discussed the importance of nutrition. He does not take aspirin as he is on Coumadin for atrial fibrillation. He does not tolerate statins. I have ordered a followed carotid duplex scan 6 months and I'll see him back at that time. He knows to call sooner if he has problems.  Deitra Mayo [facility] [phone]

## 2017-03-18 ENCOUNTER — Ambulatory Visit (INDEPENDENT_AMBULATORY_CARE_PROVIDER_SITE_OTHER): Payer: Medicare Other | Admitting: *Deleted

## 2017-03-18 DIAGNOSIS — I6523 Occlusion and stenosis of bilateral carotid arteries: Secondary | ICD-10-CM

## 2017-03-18 DIAGNOSIS — I4891 Unspecified atrial fibrillation: Secondary | ICD-10-CM

## 2017-03-18 DIAGNOSIS — Z5181 Encounter for therapeutic drug level monitoring: Secondary | ICD-10-CM

## 2017-03-18 LAB — POCT INR: INR: 3.8

## 2017-03-26 NOTE — Addendum Note (Signed)
Addended by: Lianne Cure A on: 03/26/2017 09:56 AM   Modules accepted: Orders

## 2017-04-15 ENCOUNTER — Ambulatory Visit (INDEPENDENT_AMBULATORY_CARE_PROVIDER_SITE_OTHER): Payer: Medicare Other

## 2017-04-15 DIAGNOSIS — Z5181 Encounter for therapeutic drug level monitoring: Secondary | ICD-10-CM

## 2017-04-15 DIAGNOSIS — I6523 Occlusion and stenosis of bilateral carotid arteries: Secondary | ICD-10-CM

## 2017-04-15 DIAGNOSIS — I4891 Unspecified atrial fibrillation: Secondary | ICD-10-CM

## 2017-04-15 LAB — POCT INR: INR: 4.3

## 2017-04-28 DIAGNOSIS — C61 Malignant neoplasm of prostate: Secondary | ICD-10-CM | POA: Diagnosis not present

## 2017-04-29 ENCOUNTER — Telehealth: Payer: Self-pay | Admitting: Cardiology

## 2017-04-29 ENCOUNTER — Ambulatory Visit (INDEPENDENT_AMBULATORY_CARE_PROVIDER_SITE_OTHER): Payer: Medicare Other | Admitting: *Deleted

## 2017-04-29 DIAGNOSIS — Z5181 Encounter for therapeutic drug level monitoring: Secondary | ICD-10-CM | POA: Diagnosis not present

## 2017-04-29 DIAGNOSIS — I495 Sick sinus syndrome: Secondary | ICD-10-CM

## 2017-04-29 DIAGNOSIS — I4891 Unspecified atrial fibrillation: Secondary | ICD-10-CM | POA: Diagnosis not present

## 2017-04-29 LAB — POCT INR: INR: 2.6

## 2017-04-29 NOTE — Telephone Encounter (Signed)
Spoke with pt and reminded pt of remote transmission that is due today. Pt verbalized understanding.   

## 2017-04-30 NOTE — Progress Notes (Signed)
Remote pacemaker transmission.   

## 2017-05-03 DIAGNOSIS — Z8546 Personal history of malignant neoplasm of prostate: Secondary | ICD-10-CM | POA: Diagnosis not present

## 2017-05-04 ENCOUNTER — Other Ambulatory Visit: Payer: Self-pay | Admitting: Interventional Cardiology

## 2017-05-04 ENCOUNTER — Encounter: Payer: Self-pay | Admitting: Cardiology

## 2017-05-20 ENCOUNTER — Ambulatory Visit (INDEPENDENT_AMBULATORY_CARE_PROVIDER_SITE_OTHER): Payer: Medicare Other | Admitting: Pharmacist

## 2017-05-20 DIAGNOSIS — I4891 Unspecified atrial fibrillation: Secondary | ICD-10-CM | POA: Diagnosis not present

## 2017-05-20 DIAGNOSIS — Z5181 Encounter for therapeutic drug level monitoring: Secondary | ICD-10-CM

## 2017-05-20 LAB — POCT INR: INR: 3.3

## 2017-05-21 LAB — CUP PACEART REMOTE DEVICE CHECK
Battery Impedance: 299 Ohm
Battery Remaining Longevity: 108 mo
Battery Voltage: 2.79 V
Brady Statistic AP VP Percent: 4 %
Brady Statistic AP VS Percent: 0 %
Brady Statistic AS VP Percent: 2 %
Brady Statistic AS VS Percent: 93 %
Date Time Interrogation Session: 20180906144359
Implantable Lead Implant Date: 20040920
Implantable Lead Implant Date: 20040920
Implantable Lead Location: 753859
Implantable Lead Location: 753860
Implantable Lead Model: 5076
Implantable Lead Model: 5092
Implantable Pulse Generator Implant Date: 20131001
Lead Channel Impedance Value: 468 Ohm
Lead Channel Impedance Value: 627 Ohm
Lead Channel Pacing Threshold Amplitude: 0.5 V
Lead Channel Pacing Threshold Amplitude: 1.5 V
Lead Channel Pacing Threshold Pulse Width: 0.4 ms
Lead Channel Pacing Threshold Pulse Width: 0.4 ms
Lead Channel Sensing Intrinsic Amplitude: 0.7 mV
Lead Channel Sensing Intrinsic Amplitude: 8 mV
Lead Channel Setting Pacing Amplitude: 2 V
Lead Channel Setting Pacing Amplitude: 2 V
Lead Channel Setting Pacing Pulse Width: 0.4 ms
Lead Channel Setting Sensing Sensitivity: 4 mV

## 2017-06-10 ENCOUNTER — Ambulatory Visit (INDEPENDENT_AMBULATORY_CARE_PROVIDER_SITE_OTHER): Payer: Medicare Other | Admitting: *Deleted

## 2017-06-10 DIAGNOSIS — Z5181 Encounter for therapeutic drug level monitoring: Secondary | ICD-10-CM

## 2017-06-10 DIAGNOSIS — I4891 Unspecified atrial fibrillation: Secondary | ICD-10-CM | POA: Diagnosis not present

## 2017-06-10 LAB — POCT INR: INR: 3.2

## 2017-06-16 DIAGNOSIS — H401133 Primary open-angle glaucoma, bilateral, severe stage: Secondary | ICD-10-CM | POA: Diagnosis not present

## 2017-06-17 DIAGNOSIS — Z23 Encounter for immunization: Secondary | ICD-10-CM | POA: Diagnosis not present

## 2017-06-24 ENCOUNTER — Ambulatory Visit (INDEPENDENT_AMBULATORY_CARE_PROVIDER_SITE_OTHER): Payer: Medicare Other

## 2017-06-24 DIAGNOSIS — I4891 Unspecified atrial fibrillation: Secondary | ICD-10-CM

## 2017-06-24 DIAGNOSIS — Z5181 Encounter for therapeutic drug level monitoring: Secondary | ICD-10-CM | POA: Diagnosis not present

## 2017-06-24 LAB — POCT INR: INR: 2.3

## 2017-07-22 ENCOUNTER — Ambulatory Visit (INDEPENDENT_AMBULATORY_CARE_PROVIDER_SITE_OTHER): Payer: Medicare Other | Admitting: *Deleted

## 2017-07-22 DIAGNOSIS — Z5181 Encounter for therapeutic drug level monitoring: Secondary | ICD-10-CM | POA: Diagnosis not present

## 2017-07-22 DIAGNOSIS — I4891 Unspecified atrial fibrillation: Secondary | ICD-10-CM

## 2017-07-22 LAB — POCT INR: INR: 3.3

## 2017-07-22 NOTE — Patient Instructions (Signed)
Skip tomorrow's dose, then change your dosage to 1 tablet on all days except 1/2 tablet only on Mondays, Wednesdays and Fridays. Recheck INR in 2 weeks.  Call our office if placed on any new medications or if you have any procedures [phone].

## 2017-07-23 ENCOUNTER — Encounter: Payer: Self-pay | Admitting: Internal Medicine

## 2017-07-24 ENCOUNTER — Other Ambulatory Visit: Payer: Self-pay | Admitting: Interventional Cardiology

## 2017-07-26 DIAGNOSIS — E559 Vitamin D deficiency, unspecified: Secondary | ICD-10-CM | POA: Diagnosis not present

## 2017-07-26 DIAGNOSIS — I480 Paroxysmal atrial fibrillation: Secondary | ICD-10-CM | POA: Diagnosis not present

## 2017-07-26 DIAGNOSIS — I5030 Unspecified diastolic (congestive) heart failure: Secondary | ICD-10-CM | POA: Diagnosis not present

## 2017-07-26 DIAGNOSIS — E785 Hyperlipidemia, unspecified: Secondary | ICD-10-CM | POA: Diagnosis not present

## 2017-07-26 DIAGNOSIS — R0989 Other specified symptoms and signs involving the circulatory and respiratory systems: Secondary | ICD-10-CM | POA: Diagnosis not present

## 2017-07-26 DIAGNOSIS — M1711 Unilateral primary osteoarthritis, right knee: Secondary | ICD-10-CM | POA: Diagnosis not present

## 2017-07-26 DIAGNOSIS — I10 Essential (primary) hypertension: Secondary | ICD-10-CM | POA: Diagnosis not present

## 2017-07-26 DIAGNOSIS — Z950 Presence of cardiac pacemaker: Secondary | ICD-10-CM | POA: Diagnosis not present

## 2017-07-26 DIAGNOSIS — I2510 Atherosclerotic heart disease of native coronary artery without angina pectoris: Secondary | ICD-10-CM | POA: Diagnosis not present

## 2017-07-26 DIAGNOSIS — N183 Chronic kidney disease, stage 3 (moderate): Secondary | ICD-10-CM | POA: Diagnosis not present

## 2017-07-26 DIAGNOSIS — I739 Peripheral vascular disease, unspecified: Secondary | ICD-10-CM | POA: Diagnosis not present

## 2017-07-29 ENCOUNTER — Ambulatory Visit (INDEPENDENT_AMBULATORY_CARE_PROVIDER_SITE_OTHER): Payer: Medicare Other | Admitting: *Deleted

## 2017-07-29 ENCOUNTER — Telehealth: Payer: Self-pay | Admitting: Cardiology

## 2017-07-29 DIAGNOSIS — I495 Sick sinus syndrome: Secondary | ICD-10-CM

## 2017-07-29 NOTE — Progress Notes (Signed)
Remote pacemaker transmission.   

## 2017-07-29 NOTE — Telephone Encounter (Signed)
Spoke with pt and reminded pt of remote transmission that is due today. Pt verbalized understanding.   

## 2017-08-02 ENCOUNTER — Encounter: Payer: Medicare Other | Admitting: Internal Medicine

## 2017-08-03 LAB — CUP PACEART REMOTE DEVICE CHECK
Battery Impedance: 275 Ohm
Battery Remaining Longevity: 111 mo
Battery Voltage: 2.79 V
Brady Statistic AP VP Percent: 4 %
Brady Statistic AP VS Percent: 0 %
Brady Statistic AS VP Percent: 2 %
Brady Statistic AS VS Percent: 93 %
Date Time Interrogation Session: 20181206191548
Implantable Lead Implant Date: 20040920
Implantable Lead Implant Date: 20040920
Implantable Lead Location: 753859
Implantable Lead Location: 753860
Implantable Lead Model: 5076
Implantable Lead Model: 5092
Implantable Pulse Generator Implant Date: 20131001
Lead Channel Impedance Value: 455 Ohm
Lead Channel Impedance Value: 627 Ohm
Lead Channel Pacing Threshold Amplitude: 0.625 V
Lead Channel Pacing Threshold Amplitude: 1.5 V
Lead Channel Pacing Threshold Pulse Width: 0.4 ms
Lead Channel Pacing Threshold Pulse Width: 0.4 ms
Lead Channel Setting Pacing Amplitude: 2 V
Lead Channel Setting Pacing Amplitude: 2 V
Lead Channel Setting Pacing Pulse Width: 0.4 ms
Lead Channel Setting Sensing Sensitivity: 4 mV

## 2017-08-06 ENCOUNTER — Encounter: Payer: Self-pay | Admitting: Cardiology

## 2017-08-06 ENCOUNTER — Ambulatory Visit (INDEPENDENT_AMBULATORY_CARE_PROVIDER_SITE_OTHER): Payer: Medicare Other | Admitting: Pharmacist

## 2017-08-06 DIAGNOSIS — I4891 Unspecified atrial fibrillation: Secondary | ICD-10-CM

## 2017-08-06 DIAGNOSIS — Z5181 Encounter for therapeutic drug level monitoring: Secondary | ICD-10-CM | POA: Diagnosis not present

## 2017-08-06 LAB — POCT INR: INR: 2.8

## 2017-08-06 NOTE — Patient Instructions (Signed)
Description   Continue 1 tablet on all days except 1/2 tablet only on Mondays, Wednesdays and Fridays. Recheck INR in 3 weeks.  Call our office if placed on any new medications or if you have any procedures [phone].

## 2017-08-27 ENCOUNTER — Ambulatory Visit (INDEPENDENT_AMBULATORY_CARE_PROVIDER_SITE_OTHER): Payer: Medicare Other | Admitting: *Deleted

## 2017-08-27 DIAGNOSIS — I4891 Unspecified atrial fibrillation: Secondary | ICD-10-CM | POA: Diagnosis not present

## 2017-08-27 DIAGNOSIS — Z5181 Encounter for therapeutic drug level monitoring: Secondary | ICD-10-CM | POA: Diagnosis not present

## 2017-08-27 LAB — POCT INR: INR: 2.5

## 2017-08-27 NOTE — Patient Instructions (Signed)
Description   Continue 1 tablet on all days except 1/2 tablet only on Mondays, Wednesdays and Fridays. Recheck INR in 4 weeks.  Call our office if placed on any new medications or if you have any procedures [phone].

## 2017-09-03 ENCOUNTER — Ambulatory Visit (INDEPENDENT_AMBULATORY_CARE_PROVIDER_SITE_OTHER): Payer: Medicare Other | Admitting: Internal Medicine

## 2017-09-03 ENCOUNTER — Encounter: Payer: Self-pay | Admitting: Internal Medicine

## 2017-09-03 VITALS — BP 130/80 | HR 77 | Ht 67.5 in | Wt 187.8 lb

## 2017-09-03 DIAGNOSIS — I495 Sick sinus syndrome: Secondary | ICD-10-CM

## 2017-09-03 DIAGNOSIS — I482 Chronic atrial fibrillation, unspecified: Secondary | ICD-10-CM

## 2017-09-03 DIAGNOSIS — Z950 Presence of cardiac pacemaker: Secondary | ICD-10-CM | POA: Diagnosis not present

## 2017-09-03 NOTE — Patient Instructions (Signed)
Medication Instructions:  Your physician recommends that you continue on your current medications as directed. Please refer to the Current Medication list given to you today.   Labwork: None ordered   Testing/Procedures: None ordered   Follow-Up: Your physician wants you to follow-up in: 12 months with Chanetta Marshall, NP You will receive a reminder letter in the mail two months in advance. If you don't receive a letter, please call our office to schedule the follow-up appointment.  Remote monitoring is used to monitor your Pacemaker from home. This monitoring reduces the number of office visits required to check your device to one time per year. It allows [place] to keep an eye on the functioning of your device to ensure it is working properly. You are scheduled for a device check from home on 10/28/17. You may send your transmission at any time that day. If you have a wireless device, the transmission will be sent automatically. After your physician reviews your transmission, you will receive a postcard with your next transmission date.     Any Other Special Instructions Will Be Listed Below (If Applicable).     If you need a refill on your cardiac medications before your next appointment, please call your pharmacy.

## 2017-09-03 NOTE — Progress Notes (Signed)
PCP: Josetta Huddle, MD Primary Cardiologist:  Dr [name] Primary EP:  [name]  Jonathan Warner is a 84 y.o. male who presents today for routine electrophysiology followup.  Since last being seen in our clinic, the patient reports doing very well. He has stable SOB.  Today, he denies symptoms of palpitations, chest pain, shortness of breath,  lower extremity edema, dizziness, presyncope, or syncope.  The patient is otherwise without complaint today.   Past Medical History:  Diagnosis Date  . Arthritis   . BPH (benign prostatic hyperplasia)   . CAD (coronary artery disease)    s/p CABG 1991  . Carotid artery occlusion   . Cataract    b/l surgery  . Chronic kidney disease (CKD), stage III (moderate) (HCC)   . Diverticulosis   . DJD (degenerative joint disease)   . DJD (degenerative joint disease)   . GERD (gastroesophageal reflux disease)   . Glaucoma   . Hiatal hernia   . History of adenomatous polyp of colon 06/07/13   Last colonoscopy 06/27/13 showed small adenoma, no further testing due to advanced age.  [name] History of radiation therapy 07/25/2012-09/20/2012   prostate 7800 cGy 40 sessions, seminal vesicles 5600 cGy 40 sessions  . History of shingles   . Hypercholesteremia   . Hyperlipidemia   . Hypertension   . Inguinal hernia   . Osteomyelitis of forearm, right, acute ([place])    drainage x 2  . Persistent atrial fibrillation ([place])   . Prostate cancer ([place]) 04/27/12   Dr. Eulogio Ditch. Radiation therapy. Adenocarcinoma,gleason=3+4=7, 3+3=6,PSA=4.93  volume=51cc  . Pulmonary nodules    small rlung base nodule 4.24mm, scarring in lung bases  . 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 CARDIAC CATHETERIZATION  06/04/09   left w/noted patent bypass grafts  . CATARACT EXTRACTION W/ INTRAOCULAR LENS IMPLANT  2011  . COLONOSCOPY  12/21/2007   hx polyps,  diverticulosis  . COLONOSCOPY WITH PROPOFOL N/A 06/27/2013   Procedure: COLONOSCOPY WITH PROPOFOL;  Surgeon: Garlan Fair, MD;  Location: [facility];  Service: Endoscopy;  Laterality: N/A;  . CORONARY ARTERY BYPASS GRAFT  1991   x5; prior MI  . PACEMAKER GENERATOR CHANGE  05/24/12  . PACEMAKER INSERTION  05/14/03   MDT EnPulse implanted by Dr Leonia Reeves ddd  . PERMANENT PACEMAKER GENERATOR CHANGE N/A 05/24/2012   Procedure: PERMANENT PACEMAKER GENERATOR CHANGE;  Surgeon: Thompson Grayer, MD;  Location: [facility] CATH LAB;  Service: Cardiovascular;  Laterality: N/A;  . PROSTATE BIOPSY  04/27/12   Adenocarcinoma,gleason=3+3=6, 3+4,& 4+3,PSA=4.93,volume=51cc  . TUMOR REMOVAL     skin tumors removed    ROS- all systems are reviewed and negative except as per HPI above  Current Outpatient Medications  Medication Sig Dispense Refill  . acetaminophen (TYLENOL) 500 MG tablet Take 1,000 mg by mouth every 6 (six) hours as needed for pain.     [name] amLODipine (NORVASC) 5 MG tablet Take 1 tablet (5 mg total) by mouth every morning. 90 tablet 0  . brimonidine (ALPHAGAN) 0.2 % ophthalmic solution Place 1 drop into both eyes 3 (three) times daily.     . Cholecalciferol (VITAMIN D3) 2000 units TABS Take by mouth.    . dorzolamide (TRUSOPT) 2 % ophthalmic solution Place 1 drop into both eyes 3 (three) times daily.     . furosemide (LASIX) 40  MG tablet TAKE 1 AND 1/2 TABLETS EVERY DAY 135 tablet 2  . latanoprost (XALATAN) 0.005 % ophthalmic solution Apply 1 drop to eye at bedtime.     . Multiple Vitamin (MULTIVITAMIN) tablet Take 1 tablet by mouth daily.    . nitroGLYCERIN (NITROSTAT) 0.4 MG SL tablet Place 1 tablet (0.4 mg total) under the tongue every 5 (five) minutes as needed for chest pain. 25 tablet 1  . omeprazole (PRILOSEC) 20 MG capsule Take 20 mg by mouth daily.    . potassium chloride SA (KLOR-CON M20) 20 MEQ tablet TAKE 1 AND 1/2 TABLETS BY MOUTH EVERY DAY 135 tablet 0  . vitamin E 400 UNIT capsule Take  400 Units by mouth daily.    [name] warfarin (COUMADIN) 2.5 MG tablet TAKE AS DIRECTED BY THE COUMADIN CLINIC 90 tablet 1   No current facility-administered medications for this visit.     Physical Exam: Vitals:   09/03/17 1135  BP: 130/80  Pulse: 77  SpO2: 97%  Weight: 187 lb 12.8 oz (85.2 kg)  Height: 5' 7.5" (1.715 m)    GEN- The patient is well appearing, alert and oriented x 3 today.   Head- normocephalic, atraumatic Eyes-  Sclera clear, conjunctiva pink Ears- hearing intact Oropharynx- clear Lungs- Clear to ausculation bilaterally, normal work of breathing Chest- pacemaker pocket is well healed Heart- Regular rate and rhythm, no murmurs, rubs or gallops, PMI not laterally displaced GI- soft, NT, ND, + BS Extremities- no clubbing, cyanosis, or edema  Pacemaker interrogation- reviewed in detail today,  See PACEART report  Assessment and Plan:  1. Symptomatic bradycardia  Normal pacemaker function See Pace Art report Reprogrammed VVIR today  2. Permanent afib Rate controlled Continue long term anticoagulation  3. HTN Stable No change required today  4. CAD No ischemic symptoms No changes  Follow-up with Dr [name] as scheduled [facility] Return to see EP NP every year  Thompson Grayer MD, [facility] 09/03/2017 8:46 PM

## 2017-09-22 ENCOUNTER — Encounter: Payer: Self-pay | Admitting: Vascular Surgery

## 2017-09-22 ENCOUNTER — Ambulatory Visit (HOSPITAL_COMMUNITY)
Admission: RE | Admit: 2017-09-22 | Discharge: 2017-09-22 | Disposition: A | Discharge status: Home / Self Care | Payer: Medicare Other | Source: Ambulatory Visit | Attending: Vascular Surgery | Admitting: Vascular Surgery

## 2017-09-22 ENCOUNTER — Ambulatory Visit (INDEPENDENT_AMBULATORY_CARE_PROVIDER_SITE_OTHER): Payer: Medicare Other | Admitting: Vascular Surgery

## 2017-09-22 VITALS — BP 133/78 | HR 73 | Temp 97.1°F | Resp 16 | Ht 67.5 in | Wt 185.0 lb

## 2017-09-22 DIAGNOSIS — Z87891 Personal history of nicotine dependence: Secondary | ICD-10-CM | POA: Diagnosis not present

## 2017-09-22 DIAGNOSIS — I6523 Occlusion and stenosis of bilateral carotid arteries: Secondary | ICD-10-CM

## 2017-09-22 DIAGNOSIS — I10 Essential (primary) hypertension: Secondary | ICD-10-CM | POA: Insufficient documentation

## 2017-09-22 LAB — VAS US CAROTID
LEFT ECA DIAS: -9 cm/s
LEFT VERTEBRAL DIAS: 11 cm/s
Left CCA dist dias: -19 cm/s
Left CCA dist sys: -105 cm/s
Left CCA prox dias: 24 cm/s
Left CCA prox sys: 146 cm/s
Left ICA dist dias: -20 cm/s
Left ICA dist sys: -80 cm/s
Left ICA prox dias: 67 cm/s
Left ICA prox sys: 266 cm/s
RIGHT CCA MID DIAS: 13 cm/s
RIGHT ECA DIAS: 0 cm/s
RIGHT VERTEBRAL DIAS: 6 cm/s
Right CCA prox dias: 10 cm/s
Right CCA prox sys: 71 cm/s
Right cca dist sys: -76 cm/s

## 2017-09-22 NOTE — Progress Notes (Signed)
Patient name: Jonathan Warner MRN: [id number] DOB: 1932-04-20 Sex: male  REASON FOR VISIT:   Follow-up of carotid disease.  HPI:   Jonathan Warner is a pleasant 84 y.o. male who I saw in consultation on 03/17/2017 with a left carotid stenosis.  He was found to have a carotid bruit which prompted the duplex scan.  Duplex in our office at that time showed a 60-79% left carotid stenosis and a 50-69% right carotid stenosis.  I set him up for a 46-month follow-up visit.  Since I saw him last, he denies any history of stroke, TIAs, expressive or receptive aphasia, or amaurosis fugax.  There have been no significant changes in his medical history.  Of note the patient is not on aspirin as he is on Coumadin for atrial fibrillation.  In addition, he does not tolerate statins.  Past Medical History:  Diagnosis Date  . Arthritis   . BPH (benign prostatic hyperplasia)   . CAD (coronary artery disease)    s/p CABG 1991  . Carotid artery occlusion   . Cataract    b/l surgery  . Chronic kidney disease (CKD), stage III (moderate) (HCC)   . Diverticulosis   . DJD (degenerative joint disease)   . DJD (degenerative joint disease)   . GERD (gastroesophageal reflux disease)   . Glaucoma   . Hiatal hernia   . History of adenomatous polyp of colon 06/07/13   Last colonoscopy 06/27/13 showed small adenoma, no further testing due to advanced age.  [name] History of radiation therapy 07/25/2012-09/20/2012   prostate 7800 cGy 40 sessions, seminal vesicles 5600 cGy 40 sessions  . History of shingles   . Hypercholesteremia   . Hyperlipidemia   . Hypertension   . Inguinal hernia   . Osteomyelitis of forearm, right, acute ([place])    drainage x 2  . Persistent atrial fibrillation ([place])   . Prostate cancer ([place]) 04/27/12   Dr. Eulogio Ditch. Radiation therapy. Adenocarcinoma,gleason=3+4=7, 3+3=6,PSA=4.93  volume=51cc  . Pulmonary nodules    small rlung base nodule 4.31mm, scarring in lung bases  . Symptomatic bradycardia      s/p PPM by Dr Leonia Reeves 2004  . 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 Hypertension Mother   . Congestive Heart Failure Mother   . CAD Mother   . Alzheimer's disease Father   . CAD Brother   . Pancreatic cancer Brother   . CVA Brother   . Hypertension Sister        Has 6 sisters, many suffer from HTN  . Heart attack Brother   . Stroke Brother     SOCIAL HISTORY: He is not a smoker. Social History   Tobacco Use  . Smoking status: Former Smoker    Packs/day: 0.50    Years: 10.00    Pack years: 5.00    Types: Cigarettes    Last attempt to quit: 08/24/1981    Years since quitting: 36.1  . Smokeless tobacco: Never Used  Substance Use Topics  . Alcohol use: No    Allergies  Allergen Reactions  . Crestor [Rosuvastatin]     Knee and leg pain    Current Outpatient Medications  Medication Sig Dispense Refill  . acetaminophen (TYLENOL) 500 MG tablet Take 1,000 mg by mouth every 6 (six) hours as needed for pain.     [name] amLODipine (NORVASC) 5 MG tablet Take 1 tablet (5 mg  total) by mouth every morning. 90 tablet 0  . brimonidine (ALPHAGAN) 0.2 % ophthalmic solution Place 1 drop into both eyes 3 (three) times daily.     . Cholecalciferol (VITAMIN D3) 2000 units TABS Take by mouth.    . dorzolamide (TRUSOPT) 2 % ophthalmic solution Place 1 drop into both eyes 3 (three) times daily.     . furosemide (LASIX) 40 MG tablet TAKE 1 AND 1/2 TABLETS EVERY DAY 135 tablet 2  . latanoprost (XALATAN) 0.005 % ophthalmic solution Apply 1 drop to eye at bedtime.     . Multiple Vitamin (MULTIVITAMIN) tablet Take 1 tablet by mouth daily.    . nitroGLYCERIN (NITROSTAT) 0.4 MG SL tablet Place 1 tablet (0.4 mg total) under the tongue every 5 (five) minutes as needed for chest pain. 25 tablet 1  . omeprazole (PRILOSEC) 20 MG capsule Take 20 mg by mouth daily.    . potassium chloride SA (KLOR-CON M20) 20 MEQ tablet TAKE 1 AND 1/2  TABLETS BY MOUTH EVERY DAY 135 tablet 0  . vitamin E 400 UNIT capsule Take 400 Units by mouth daily.    [name] warfarin (COUMADIN) 2.5 MG tablet TAKE AS DIRECTED BY THE COUMADIN CLINIC 90 tablet 1   No current facility-administered medications for this visit.     REVIEW OF SYSTEMS:  [X]  denotes positive finding, [ ]  denotes negative finding Cardiac  Comments:  Chest pain or chest pressure:    Shortness of breath upon exertion:    Short of breath when lying flat:    Irregular heart rhythm: x       Vascular    Pain in calf, thigh, or hip brought on by ambulation:    Pain in feet at night that wakes you up from your sleep:     Blood clot in your veins:    Leg swelling:         Pulmonary    Oxygen at home:    Productive cough:     Wheezing:         Neurologic    Sudden weakness in arms or legs:     Sudden numbness in arms or legs:     Sudden onset of difficulty speaking or slurred speech:    Temporary loss of vision in one eye:     Problems with dizziness:         Gastrointestinal    Blood in stool:     Vomited blood:         Genitourinary    Burning when urinating:     Blood in urine:        Psychiatric    Major depression:         Hematologic    Bleeding problems:    Problems with blood clotting too easily:        Skin    Rashes or ulcers:        Constitutional    Fever or chills:     PHYSICAL EXAM:   Vitals:   09/22/17 1147 09/22/17 1150  BP: 133/74 133/78  Pulse: 73   Resp: 16   Temp: (!) 97.1 F (36.2 C)   TempSrc: Oral   SpO2: 95%   Weight: 185 lb (83.9 kg)   Height: 5' 7.5" (1.715 m)     GENERAL: The patient is a well-nourished male, in no acute distress. The vital signs are documented above. CARDIAC: There is a regular rate and rhythm.  VASCULAR: I do not detect carotid  bruits. Both feet are warm and well-perfused. He has no significant lower extremity swelling. PULMONARY: There is good air exchange bilaterally without wheezing or  rales. ABDOMEN: Soft and non-tender with normal pitched bowel sounds.  MUSCULOSKELETAL: There are no major deformities or cyanosis. NEUROLOGIC: No focal weakness or paresthesias are detected. SKIN: There are no ulcers or rashes noted. PSYCHIATRIC: The patient has a normal affect.  DATA:    CAROTID DUPLEX: I have independently interpreted his carotid duplex scan today.  On the right side there is a 40-59% stenosis.  On the left side there is a 60-79% stenosis.  Both vertebral arteries are patent with antegrade flow.  MEDICAL ISSUES:   BILATERAL CAROTID DISEASE: This patient has an asymptomatic 60-79% left carotid stenosis and 40-59% right carotid stenosis.  He understands we would not consider carotid endarterectomy unless the stenosis progressed to greater than 80% or he develop new neurologic symptoms.  I have encouraged him to stay as active as possible.  We discussed the importance of nutrition.  He is not on aspirin because of his Coumadin.  He does not tolerate statins.  I have ordered a follow-up carotid duplex scan in 6 months and I will see him back at that time.  He knows to call sooner if he has problems.  Deitra Mayo [facility] [phone]

## 2017-09-23 ENCOUNTER — Ambulatory Visit (INDEPENDENT_AMBULATORY_CARE_PROVIDER_SITE_OTHER): Payer: Medicare Other

## 2017-09-23 DIAGNOSIS — I4891 Unspecified atrial fibrillation: Secondary | ICD-10-CM | POA: Diagnosis not present

## 2017-09-23 DIAGNOSIS — Z5181 Encounter for therapeutic drug level monitoring: Secondary | ICD-10-CM

## 2017-09-23 LAB — POCT INR: INR: 2.4

## 2017-09-23 NOTE — Patient Instructions (Signed)
Description   Continue 1 tablet on all days except 1/2 tablet on Mondays, Wednesdays and Fridays. Recheck INR in 6 weeks.  Call our office if placed on any new medications or if you have any procedures [phone].

## 2017-09-27 NOTE — Addendum Note (Signed)
Addended by: Lianne Cure A on: 09/27/2017 04:11 PM   Modules accepted: Orders

## 2017-10-06 LAB — CUP PACEART INCLINIC DEVICE CHECK
Battery Impedance: 299 Ohm
Battery Remaining Longevity: 109 mo
Battery Voltage: 2.79 V
Brady Statistic RV Percent Paced: 67 %
Date Time Interrogation Session: 20190111164311
Implantable Lead Implant Date: 20040920
Implantable Lead Implant Date: 20040920
Implantable Lead Location: 753859
Implantable Lead Location: 753860
Implantable Lead Model: 5076
Implantable Lead Model: 5092
Implantable Pulse Generator Implant Date: 20131001
Lead Channel Impedance Value: 455 Ohm
Lead Channel Impedance Value: 614 Ohm
Lead Channel Pacing Threshold Amplitude: 0.5 V
Lead Channel Pacing Threshold Pulse Width: 0.4 ms
Lead Channel Sensing Intrinsic Amplitude: 0.5 mV
Lead Channel Sensing Intrinsic Amplitude: 11.2 mV
Lead Channel Setting Pacing Amplitude: 2.5 V
Lead Channel Setting Pacing Pulse Width: 0.4 ms
Lead Channel Setting Sensing Sensitivity: 2.8 mV

## 2017-10-21 DIAGNOSIS — H401113 Primary open-angle glaucoma, right eye, severe stage: Secondary | ICD-10-CM | POA: Diagnosis not present

## 2017-10-21 DIAGNOSIS — H401123 Primary open-angle glaucoma, left eye, severe stage: Secondary | ICD-10-CM | POA: Diagnosis not present

## 2017-10-22 ENCOUNTER — Other Ambulatory Visit: Payer: Self-pay | Admitting: Interventional Cardiology

## 2017-10-28 ENCOUNTER — Ambulatory Visit (INDEPENDENT_AMBULATORY_CARE_PROVIDER_SITE_OTHER): Payer: Medicare Other | Admitting: *Deleted

## 2017-10-28 DIAGNOSIS — I495 Sick sinus syndrome: Secondary | ICD-10-CM

## 2017-10-28 NOTE — Progress Notes (Signed)
Remote pacemaker transmission.   

## 2017-10-29 ENCOUNTER — Encounter: Payer: Self-pay | Admitting: Cardiology

## 2017-10-29 NOTE — Progress Notes (Signed)
Letter  

## 2017-11-01 DIAGNOSIS — Z8546 Personal history of malignant neoplasm of prostate: Secondary | ICD-10-CM | POA: Diagnosis not present

## 2017-11-04 ENCOUNTER — Ambulatory Visit (INDEPENDENT_AMBULATORY_CARE_PROVIDER_SITE_OTHER): Payer: Medicare Other | Admitting: *Deleted

## 2017-11-04 DIAGNOSIS — Z5181 Encounter for therapeutic drug level monitoring: Secondary | ICD-10-CM | POA: Diagnosis not present

## 2017-11-04 DIAGNOSIS — I4891 Unspecified atrial fibrillation: Secondary | ICD-10-CM | POA: Diagnosis not present

## 2017-11-04 LAB — POCT INR: INR: 2.4

## 2017-11-04 NOTE — Patient Instructions (Signed)
Description   Continue 1 tablet on all days except 1/2 tablet on Mondays, Wednesdays and Fridays. Recheck INR in 6 weeks.  Call our office if placed on any new medications or if you have any procedures [phone].

## 2017-11-06 LAB — CUP PACEART REMOTE DEVICE CHECK
Battery Impedance: 346 Ohm
Battery Remaining Longevity: 115 mo
Battery Voltage: 2.78 V
Brady Statistic RV Percent Paced: 57 %
Date Time Interrogation Session: 20190307142426
Implantable Lead Implant Date: 20040920
Implantable Lead Implant Date: 20040920
Implantable Lead Location: 753859
Implantable Lead Location: 753860
Implantable Lead Model: 5076
Implantable Lead Model: 5092
Implantable Pulse Generator Implant Date: 20131001
Lead Channel Impedance Value: 639 Ohm
Lead Channel Impedance Value: 67 Ohm
Lead Channel Pacing Threshold Amplitude: 0.625 V
Lead Channel Pacing Threshold Pulse Width: 0.4 ms
Lead Channel Setting Pacing Amplitude: 2.5 V
Lead Channel Setting Pacing Pulse Width: 0.4 ms
Lead Channel Setting Sensing Sensitivity: 4 mV

## 2017-11-17 DIAGNOSIS — H401113 Primary open-angle glaucoma, right eye, severe stage: Secondary | ICD-10-CM | POA: Diagnosis not present

## 2017-11-17 DIAGNOSIS — H401123 Primary open-angle glaucoma, left eye, severe stage: Secondary | ICD-10-CM | POA: Diagnosis not present

## 2017-12-16 ENCOUNTER — Ambulatory Visit (INDEPENDENT_AMBULATORY_CARE_PROVIDER_SITE_OTHER): Payer: Medicare Other | Admitting: *Deleted

## 2017-12-16 DIAGNOSIS — I4891 Unspecified atrial fibrillation: Secondary | ICD-10-CM

## 2017-12-16 DIAGNOSIS — Z5181 Encounter for therapeutic drug level monitoring: Secondary | ICD-10-CM | POA: Diagnosis not present

## 2017-12-16 LAB — POCT INR: INR: 2.6

## 2017-12-16 NOTE — Patient Instructions (Signed)
Description   Continue 1 tablet on all days except 1/2 tablet on Mondays, Wednesdays and Fridays. Recheck INR in 6 weeks.  Call our office if placed on any new medications or if you have any procedures [phone].

## 2018-01-02 NOTE — Progress Notes (Signed)
Cardiology Office Note    Date:  01/03/2018   ID:  Jonathan Warner, DOB 1932-01-03, MRN [id number]  PCP:  Josetta Huddle, MD  Cardiologist: Sinclair Grooms, MD   No chief complaint on file.   History of Present Illness:  Jonathan Warner is a 84 y.o. male who presents for Follow-up of coronary artery disease with bypass surgery 1991, atrial fibrillation, tachycardia-bradycardia syndrome with [employer] DDD pacemaker, hyperlipidemia, and chronic limiting osteoarthritis.  Doing well.  Denies angina.  No orthopnea, PND, syncope, or bilateral lower extremity edema.  Has chronic right lower extremity ankle edema.  No bleeding on anticoagulation therapy.   Past Medical History:  Diagnosis Date  . Arthritis   . BPH (benign prostatic hyperplasia)   . CAD (coronary artery disease)    s/p CABG 1991  . Carotid artery occlusion   . Cataract    b/l surgery  . Chronic kidney disease (CKD), stage III (moderate) (HCC)   . Diverticulosis   . DJD (degenerative joint disease)   . DJD (degenerative joint disease)   . GERD (gastroesophageal reflux disease)   . Glaucoma   . Hiatal hernia   . History of adenomatous polyp of colon 06/07/13   Last colonoscopy 06/27/13 showed small adenoma, no further testing due to advanced age.  [name] History of radiation therapy 07/25/2012-09/20/2012   prostate 7800 cGy 40 sessions, seminal vesicles 5600 cGy 40 sessions  . History of shingles   . Hypercholesteremia   . Hyperlipidemia   . Hypertension   . Inguinal hernia   . Osteomyelitis of forearm, right, acute ([place])    drainage x 2  . Persistent atrial fibrillation ([place])   . Prostate cancer ([place]) 04/27/12   Dr. Eulogio Ditch. Radiation therapy. Adenocarcinoma,gleason=3+4=7, 3+3=6,PSA=4.93  volume=51cc  . Pulmonary nodules    small rlung base nodule 4.35mm, scarring in lung bases  . 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 CARDIAC CATHETERIZATION  06/04/09   left w/noted patent bypass grafts  . CATARACT EXTRACTION W/ INTRAOCULAR LENS IMPLANT  2011  . COLONOSCOPY  12/21/2007   hx polyps, diverticulosis  . COLONOSCOPY WITH PROPOFOL N/A 06/27/2013   Procedure: COLONOSCOPY WITH PROPOFOL;  Surgeon: Garlan Fair, MD;  Location: [facility];  Service: Endoscopy;  Laterality: N/A;  . CORONARY ARTERY BYPASS GRAFT  1991   x5; prior MI  . PACEMAKER GENERATOR CHANGE  05/24/12  . PACEMAKER INSERTION  05/14/03   MDT EnPulse implanted by Dr Leonia Reeves ddd  . PERMANENT PACEMAKER GENERATOR CHANGE N/A 05/24/2012   Procedure: PERMANENT PACEMAKER GENERATOR CHANGE;  Surgeon: Thompson Grayer, MD;  Location: [facility] CATH LAB;  Service: Cardiovascular;  Laterality: N/A;  . PROSTATE BIOPSY  04/27/12   Adenocarcinoma,gleason=3+3=6, 3+4,& 4+3,PSA=4.93,volume=51cc  . TUMOR REMOVAL     skin tumors removed    Current Medications: Outpatient Medications Prior to Visit  Medication Sig Dispense Refill  . acetaminophen (TYLENOL) 500 MG tablet Take 1,000 mg by mouth every 6 (six) hours as needed for pain.     [name] amLODipine (NORVASC) 5 MG tablet Take 1 tablet (5 mg total) by mouth every morning. Patient needs an appointment for further refills 1st attempt 90 tablet 0  . brimonidine (ALPHAGAN) 0.2 % ophthalmic solution Place 1 drop into both eyes 3 (three) times daily.     . Cholecalciferol (VITAMIN  D3) 2000 units TABS Take 1 tablet by mouth daily.     . dorzolamide (TRUSOPT) 2 % ophthalmic solution Place 1 drop into both eyes 3 (three) times daily.     . furosemide (LASIX) 40 MG tablet TAKE 1 AND 1/2 TABLETS BY MOUTH EVERY DAY Patient needs an appointment for further refills 1st attempt 135 tablet 0  . latanoprost (XALATAN) 0.005 % ophthalmic solution Apply 1 drop to eye at bedtime.     . Multiple Vitamin (MULTIVITAMIN) tablet Take 1 tablet by mouth daily.    . nitroGLYCERIN (NITROSTAT) 0.4 MG SL tablet Place 1 tablet  (0.4 mg total) under the tongue every 5 (five) minutes as needed for chest pain. 25 tablet 1  . omeprazole (PRILOSEC) 20 MG capsule Take 20 mg by mouth daily.    . potassium chloride SA (KLOR-CON M20) 20 MEQ tablet TAKE 1 AND 1/2 TABLETS BY MOUTH EVERY DAY Patient needs an appointment for further refills 1st attempt 135 tablet 0  . vitamin E 400 UNIT capsule Take 400 Units by mouth daily.    [name] warfarin (COUMADIN) 2.5 MG tablet TAKE AS DIRECTED BY THE COUMADIN CLINIC 90 tablet 0   No facility-administered medications prior to visit.      Allergies:   Crestor [rosuvastatin]   Social History   Socioeconomic History  . Marital status: Married    Spouse name: Not on file  . Number of children: 1  . Years of education: Not on file  . Highest education level: Not on file  Occupational History  . Occupation: Retired    Comment: Chief Financial Officer  Social Needs  . Financial resource strain: Not on file  . Food insecurity:    Worry: Not on file    Inability: Not on file  . Transportation needs:    Medical: Not on file    Non-medical: Not on file  Tobacco Use  . Smoking status: Former Smoker    Packs/day: 0.50    Years: 10.00    Pack years: 5.00    Types: Cigarettes    Last attempt to quit: 08/24/1981    Years since quitting: 36.3  . Smokeless tobacco: Never Used  Substance and Sexual Activity  . Alcohol use: No  . Drug use: No  . Sexual activity: Not on file  Lifestyle  . Physical activity:    Days per week: Not on file    Minutes per session: Not on file  . Stress: Not on file  Relationships  . Social connections:    Talks on phone: Not on file    Gets together: Not on file    Attends religious service: Not on file    Active member of club or organization: Not on file    Attends meetings of clubs or organizations: Not on file    Relationship status: Not on file  Other Topics Concern  . Not on file  Social History Narrative   Retired Information systems manager.  Lives in [place].     Family History:  The patient's family history includes Alzheimer's disease in his father; CAD in his brother and mother; CVA in his brother; Congestive Heart Failure in his mother; Heart attack in his brother; Hypertension in his mother and sister; Pancreatic cancer in his brother; Stroke in his brother.   ROS:   Please see the history of present illness.    Leg swelling, vision disturbance, leg pain, skipped heartbeats. All other systems reviewed and are negative.  PHYSICAL EXAM:   VS:  BP (!) 146/72   Pulse 81   Ht 5\' 10"  (1.778 m)   Wt 189 lb 12.8 oz (86.1 kg)   BMI 27.23 kg/m    GEN: Well nourished, well developed, in no acute distress  HEENT: normal  Neck: no JVD, carotid bruits, or masses Cardiac: RRR; no murmurs, rubs, or gallops,no edema  Respiratory:  clear to auscultation bilaterally, normal work of breathing GI: soft, nontender, nondistended, + BS MS: no deformity or atrophy  Skin: warm and dry, no rash Neuro:  Alert and Oriented x 3, Strength and sensation are intact Psych: euthymic mood, full affect  Wt Readings from Last 3 Encounters:  01/03/18 189 lb 12.8 oz (86.1 kg)  09/22/17 185 lb (83.9 kg)  09/03/17 187 lb 12.8 oz (85.2 kg)      Studies/Labs Reviewed:   EKG:  EKG atrial fibrillation, heart rate 81 bpm, V paced rhythm.  No change compared to the prior year.  Recent Labs: No results found for requested labs within last 8760 hours.   Lipid Panel    Component Value Date/Time   CHOL 161 06/14/2013 0955   TRIG 250.0 (H) 06/14/2013 0955   HDL 34.70 (L) 06/14/2013 0955   CHOLHDL 5 06/14/2013 0955   VLDL 50.0 (H) 06/14/2013 0955   LDLDIRECT 83.5 06/14/2013 0955    Additional studies/ records that were reviewed today include:  Carotid Doppler study January 2019:  Final Interpretation: Right Carotid: Velocities in the right ICA are consistent with a 40-59%        stenosis.  Left Carotid: Velocities in  the left ICA are consistent with a 60-79% stenosis.       Non-hemodynamically significant plaque noted in the CCA.  Vertebrals: Both vertebral arteries were patent with antegrade flow. Subclavians: Normal flow hemodynamics were seen in bilateral subclavian       arteries.  ASSESSMENT:    1. Coronary artery disease involving native coronary artery of native heart with angina pectoris ([place])   2. Hypertensive heart and chronic kidney disease with heart failure and stage 1 through stage 4 chronic kidney disease, or chronic kidney disease ([place])   3. Chronic anticoagulation   4. Chronic atrial fibrillation (HCC)      PLAN:  In order of problems listed above:  1. No angina or symptoms of ischemia. 2. Blood pressure is adequate.  I prefer target is 130/80.  He is close.  Low-salt diet we discussed. 3. Continue chronic Coumadin therapy in the setting of chronic atrial fibrillation. 4. Controlled rate.  Clinical follow-up in 1 year.  Continue current medical regimen.  Call if angina.  Medication Adjustments/Labs and Tests Ordered: Current medicines are reviewed at length with the patient today.  Concerns regarding medicines are outlined above.  Medication changes, Labs and Tests ordered today are listed in the Patient Instructions below. Patient Instructions  Medication Instructions:  Your physician recommends that you continue on your current medications as directed. Please refer to the Current Medication list given to you today.  Labwork: None  Testing/Procedures: None  Follow-Up: Your physician wants you to follow-up in: 1 year with Dr. [name].  You will receive a reminder letter in the mail two months in advance. If you don't receive a letter, please call our office to schedule the follow-up appointment.   Any Other Special Instructions Will Be Listed Below (If Applicable).     If you need a refill on your cardiac medications before your next appointment,  please call your pharmacy.      Signed, Sinclair Grooms, MD  01/03/2018 9:29 AM    [facility] [address], [place], [place]  [address] Phone: [phone]; Fax: [phone]

## 2018-01-03 ENCOUNTER — Ambulatory Visit (INDEPENDENT_AMBULATORY_CARE_PROVIDER_SITE_OTHER): Payer: Medicare Other | Admitting: Interventional Cardiology

## 2018-01-03 ENCOUNTER — Encounter: Payer: Self-pay | Admitting: Interventional Cardiology

## 2018-01-03 VITALS — BP 146/72 | HR 81 | Ht 70.0 in | Wt 189.8 lb

## 2018-01-03 DIAGNOSIS — I482 Chronic atrial fibrillation, unspecified: Secondary | ICD-10-CM

## 2018-01-03 DIAGNOSIS — Z7901 Long term (current) use of anticoagulants: Secondary | ICD-10-CM | POA: Diagnosis not present

## 2018-01-03 DIAGNOSIS — I130 Hypertensive heart and chronic kidney disease with heart failure and stage 1 through stage 4 chronic kidney disease, or unspecified chronic kidney disease: Secondary | ICD-10-CM

## 2018-01-03 DIAGNOSIS — I6523 Occlusion and stenosis of bilateral carotid arteries: Secondary | ICD-10-CM | POA: Diagnosis not present

## 2018-01-03 DIAGNOSIS — I25119 Atherosclerotic heart disease of native coronary artery with unspecified angina pectoris: Secondary | ICD-10-CM

## 2018-01-03 NOTE — Patient Instructions (Signed)

## 2018-01-24 ENCOUNTER — Other Ambulatory Visit: Payer: Self-pay | Admitting: Interventional Cardiology

## 2018-01-27 ENCOUNTER — Ambulatory Visit (INDEPENDENT_AMBULATORY_CARE_PROVIDER_SITE_OTHER): Payer: Medicare Other | Admitting: *Deleted

## 2018-01-27 DIAGNOSIS — I4891 Unspecified atrial fibrillation: Secondary | ICD-10-CM

## 2018-01-27 DIAGNOSIS — Z5181 Encounter for therapeutic drug level monitoring: Secondary | ICD-10-CM

## 2018-01-27 DIAGNOSIS — I495 Sick sinus syndrome: Secondary | ICD-10-CM | POA: Diagnosis not present

## 2018-01-27 LAB — POCT INR: INR: 2.5 (ref 2.0–3.0)

## 2018-01-27 NOTE — Patient Instructions (Signed)
Description   Continue 1 tablet on all days except 1/2 tablet on Mondays, Wednesdays and Fridays. Recheck INR in 6 weeks.  Call our office if placed on any new medications or if you have any procedures [phone].

## 2018-01-27 NOTE — Progress Notes (Signed)
Remote pacemaker transmission.   

## 2018-02-04 ENCOUNTER — Other Ambulatory Visit: Payer: Self-pay | Admitting: Interventional Cardiology

## 2018-02-23 ENCOUNTER — Encounter: Payer: Self-pay | Admitting: Internal Medicine

## 2018-02-23 DIAGNOSIS — E785 Hyperlipidemia, unspecified: Secondary | ICD-10-CM | POA: Diagnosis not present

## 2018-02-23 DIAGNOSIS — I739 Peripheral vascular disease, unspecified: Secondary | ICD-10-CM | POA: Diagnosis not present

## 2018-02-23 DIAGNOSIS — M1711 Unilateral primary osteoarthritis, right knee: Secondary | ICD-10-CM | POA: Diagnosis not present

## 2018-02-23 DIAGNOSIS — I2510 Atherosclerotic heart disease of native coronary artery without angina pectoris: Secondary | ICD-10-CM | POA: Diagnosis not present

## 2018-02-23 DIAGNOSIS — Z0000 Encounter for general adult medical examination without abnormal findings: Secondary | ICD-10-CM | POA: Diagnosis not present

## 2018-02-23 DIAGNOSIS — G629 Polyneuropathy, unspecified: Secondary | ICD-10-CM | POA: Diagnosis not present

## 2018-02-23 DIAGNOSIS — I10 Essential (primary) hypertension: Secondary | ICD-10-CM | POA: Diagnosis not present

## 2018-02-23 DIAGNOSIS — I5030 Unspecified diastolic (congestive) heart failure: Secondary | ICD-10-CM | POA: Diagnosis not present

## 2018-02-23 DIAGNOSIS — E559 Vitamin D deficiency, unspecified: Secondary | ICD-10-CM | POA: Diagnosis not present

## 2018-02-23 DIAGNOSIS — I480 Paroxysmal atrial fibrillation: Secondary | ICD-10-CM | POA: Diagnosis not present

## 2018-02-23 DIAGNOSIS — Z79899 Other long term (current) drug therapy: Secondary | ICD-10-CM | POA: Diagnosis not present

## 2018-02-23 DIAGNOSIS — N183 Chronic kidney disease, stage 3 (moderate): Secondary | ICD-10-CM | POA: Diagnosis not present

## 2018-03-01 DIAGNOSIS — H5213 Myopia, bilateral: Secondary | ICD-10-CM | POA: Diagnosis not present

## 2018-03-01 DIAGNOSIS — Z961 Presence of intraocular lens: Secondary | ICD-10-CM | POA: Diagnosis not present

## 2018-03-02 DIAGNOSIS — M1711 Unilateral primary osteoarthritis, right knee: Secondary | ICD-10-CM | POA: Diagnosis not present

## 2018-03-08 DIAGNOSIS — M1711 Unilateral primary osteoarthritis, right knee: Secondary | ICD-10-CM | POA: Diagnosis not present

## 2018-03-08 DIAGNOSIS — M1712 Unilateral primary osteoarthritis, left knee: Secondary | ICD-10-CM | POA: Diagnosis not present

## 2018-03-09 DIAGNOSIS — M1711 Unilateral primary osteoarthritis, right knee: Secondary | ICD-10-CM | POA: Diagnosis not present

## 2018-03-09 DIAGNOSIS — M1712 Unilateral primary osteoarthritis, left knee: Secondary | ICD-10-CM | POA: Diagnosis not present

## 2018-03-10 ENCOUNTER — Ambulatory Visit (INDEPENDENT_AMBULATORY_CARE_PROVIDER_SITE_OTHER): Payer: Medicare Other | Admitting: Pharmacist

## 2018-03-10 DIAGNOSIS — I4891 Unspecified atrial fibrillation: Secondary | ICD-10-CM

## 2018-03-10 DIAGNOSIS — Z5181 Encounter for therapeutic drug level monitoring: Secondary | ICD-10-CM | POA: Diagnosis not present

## 2018-03-10 LAB — POCT INR: INR: 2.9 (ref 2.0–3.0)

## 2018-03-10 NOTE — Patient Instructions (Signed)
Description   Continue 1 tablet on all days except 1/2 tablet on Mondays, Wednesdays and Fridays. Recheck INR in 6 weeks.  Call our office if placed on any new medications or if you have any procedures [phone].

## 2018-03-16 DIAGNOSIS — M1712 Unilateral primary osteoarthritis, left knee: Secondary | ICD-10-CM | POA: Diagnosis not present

## 2018-03-16 DIAGNOSIS — M1711 Unilateral primary osteoarthritis, right knee: Secondary | ICD-10-CM | POA: Diagnosis not present

## 2018-03-16 LAB — CUP PACEART REMOTE DEVICE CHECK
Battery Impedance: 371 Ohm
Battery Remaining Longevity: 112 mo
Battery Voltage: 2.78 V
Brady Statistic RV Percent Paced: 56 %
Date Time Interrogation Session: 20190606121937
Implantable Lead Implant Date: 20040920
Implantable Lead Implant Date: 20040920
Implantable Lead Location: 753859
Implantable Lead Location: 753860
Implantable Lead Model: 5076
Implantable Lead Model: 5092
Implantable Pulse Generator Implant Date: 20131001
Lead Channel Impedance Value: 622 Ohm
Lead Channel Impedance Value: 67 Ohm
Lead Channel Pacing Threshold Amplitude: 0.625 V
Lead Channel Pacing Threshold Pulse Width: 0.4 ms
Lead Channel Setting Pacing Amplitude: 2.5 V
Lead Channel Setting Pacing Pulse Width: 0.4 ms
Lead Channel Setting Sensing Sensitivity: 4 mV

## 2018-03-18 DIAGNOSIS — M1711 Unilateral primary osteoarthritis, right knee: Secondary | ICD-10-CM | POA: Diagnosis not present

## 2018-03-18 DIAGNOSIS — M1712 Unilateral primary osteoarthritis, left knee: Secondary | ICD-10-CM | POA: Diagnosis not present

## 2018-03-23 ENCOUNTER — Encounter: Payer: Self-pay | Admitting: Vascular Surgery

## 2018-03-23 ENCOUNTER — Other Ambulatory Visit: Payer: Self-pay

## 2018-03-23 ENCOUNTER — Ambulatory Visit (HOSPITAL_COMMUNITY)
Admission: RE | Admit: 2018-03-23 | Discharge: 2018-03-23 | Disposition: A | Discharge status: Home / Self Care | Payer: Medicare Other | Source: Ambulatory Visit | Attending: Vascular Surgery | Admitting: Vascular Surgery

## 2018-03-23 ENCOUNTER — Ambulatory Visit (INDEPENDENT_AMBULATORY_CARE_PROVIDER_SITE_OTHER): Payer: Medicare Other | Admitting: Vascular Surgery

## 2018-03-23 VITALS — BP 131/69 | HR 68 | Temp 97.3°F | Resp 18 | Ht 68.0 in | Wt 187.0 lb

## 2018-03-23 DIAGNOSIS — I6523 Occlusion and stenosis of bilateral carotid arteries: Secondary | ICD-10-CM | POA: Diagnosis not present

## 2018-03-23 NOTE — Progress Notes (Signed)
Patient name: Jonathan Warner MRN: [id number] DOB: 1931/12/31 Sex: male  REASON FOR VISIT:   Follow-up of carotid disease.  HPI:   Jonathan Warner is a pleasant 84 y.o. male who I have been following with carotid disease.  I last saw him on 09/22/2017.  At that time he had a 40 to 59% right carotid stenosis and a 60 to 79% left carotid stenosis.  He was asymptomatic.  He comes in for a six-month follow-up visit.  Of note he is on Coumadin for atrial fibrillation and therefore does not take aspirin.  In addition he does not tolerate statins.  Since I saw him last, he denies any history of stroke, TIAs, expressive or receptive aphasia, or amaurosis fugax.  He denies any history of claudication although I think his activity is fairly limited.  He occasionally has some paresthesias in his feet.  He is not a smoker.   Past Medical History:  Diagnosis Date  . Arthritis   . BPH (benign prostatic hyperplasia)   . CAD (coronary artery disease)    s/p CABG 1991  . Carotid artery occlusion   . Cataract    b/l surgery  . Chronic kidney disease (CKD), stage III (moderate) (HCC)   . Diverticulosis   . DJD (degenerative joint disease)   . DJD (degenerative joint disease)   . GERD (gastroesophageal reflux disease)   . Glaucoma   . Hiatal hernia   . History of adenomatous polyp of colon 06/07/13   Last colonoscopy 06/27/13 showed small adenoma, no further testing due to advanced age.  [name] History of radiation therapy 07/25/2012-09/20/2012   prostate 7800 cGy 40 sessions, seminal vesicles 5600 cGy 40 sessions  . History of shingles   . Hypercholesteremia   . Hyperlipidemia   . Hypertension   . Inguinal hernia   . Osteomyelitis of forearm, right, acute ([place])    drainage x 2  . Persistent atrial fibrillation ([place])   . Prostate cancer ([place]) 04/27/12   Dr. Eulogio Ditch. Radiation therapy. Adenocarcinoma,gleason=3+4=7, 3+3=6,PSA=4.93  volume=51cc  . Pulmonary nodules    small rlung base nodule 4.32mm,  scarring in lung bases  . 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 Hypertension Mother   . Congestive Heart Failure Mother   . CAD Mother   . Alzheimer's disease Father   . CAD Brother   . Pancreatic cancer Brother   . CVA Brother   . Hypertension Sister        Has 6 sisters, many suffer from HTN  . Heart attack Brother   . Stroke Brother     SOCIAL HISTORY: Social History   Tobacco Use  . Smoking status: Former Smoker    Packs/day: 0.50    Years: 10.00    Pack years: 5.00    Types: Cigarettes    Last attempt to quit: 08/24/1981    Years since quitting: 36.6  . Smokeless tobacco: Never Used  Substance Use Topics  . Alcohol use: No    Allergies  Allergen Reactions  . Crestor [Rosuvastatin]     Knee and leg pain    Current Outpatient Medications  Medication Sig Dispense Refill  . acetaminophen (TYLENOL) 500 MG tablet Take 1,000 mg by mouth every 6 (six) hours as needed for pain.     [name] amLODipine (NORVASC) 5 MG  tablet Take 1 tablet (5 mg total) by mouth daily. 90 tablet 3  . brimonidine (ALPHAGAN) 0.2 % ophthalmic solution Place 1 drop into both eyes 3 (three) times daily.     . Cholecalciferol (VITAMIN D3) 2000 units TABS Take 1 tablet by mouth daily.     . dorzolamide (TRUSOPT) 2 % ophthalmic solution Place 1 drop into both eyes 3 times daily.    . furosemide (LASIX) 40 MG tablet TAKE 1 AND 1/2 TABLETS BY MOUTH EVERY DAY 135 tablet 3  . latanoprost (XALATAN) 0.005 % ophthalmic solution Apply 1 drop to eye at bedtime.     . Multiple Vitamin (MULTIVITAMIN) tablet Take 1 tablet by mouth daily.    . nitroGLYCERIN (NITROSTAT) 0.4 MG SL tablet Place 1 tablet (0.4 mg total) under the tongue every 5 (five) minutes as needed for chest pain. 25 tablet 1  . omeprazole (PRILOSEC) 20 MG capsule Take 20 mg by mouth daily.    . potassium chloride SA  (K-DUR,KLOR-CON) 20 MEQ tablet TAKE 1 AND 1/2 TABLETS BY MOUTH EVERY DAY (NEEDS AN APPOINTMENT FOR FURTHER REFILLS, 1ST ATTEMPT) 135 tablet 3  . vitamin E 400 UNIT capsule Take 400 Units by mouth daily.    [name] warfarin (COUMADIN) 2.5 MG tablet TAKE AS DIRECTED BY THE COUMADIN CLINIC 90 tablet 0   No current facility-administered medications for this visit.     REVIEW OF SYSTEMS:  [X]  denotes positive finding, [ ]  denotes negative finding Cardiac  Comments:  Chest pain or chest pressure:    Shortness of breath upon exertion: x   Short of breath when lying flat:    Irregular heart rhythm:        Vascular    Pain in calf, thigh, or hip brought on by ambulation:    Pain in feet at night that wakes you up from your sleep:     Blood clot in your veins:    Leg swelling:         Pulmonary    Oxygen at home:    Productive cough:     Wheezing:         Neurologic    Sudden weakness in arms or legs:     Sudden numbness in arms or legs:     Sudden onset of difficulty speaking or slurred speech:    Temporary loss of vision in one eye:     Problems with dizziness:         Gastrointestinal    Blood in stool:     Vomited blood:         Genitourinary    Burning when urinating:     Blood in urine:        Psychiatric    Major depression:         Hematologic    Bleeding problems:    Problems with blood clotting too easily:        Skin    Rashes or ulcers:        Constitutional    Fever or chills:     PHYSICAL EXAM:   Vitals:   03/23/18 1526 03/23/18 1530 03/23/18 1532  BP: (!) 141/75 137/67 131/69  Pulse: 68 68 68  Resp: 18    Temp: (!) 97.3 F (36.3 C)    TempSrc: Oral    SpO2: 97%    Weight: 187 lb (84.8 kg)    Height: 5\' 8"  (1.727 m)      GENERAL: The patient is a  well-nourished male, in no acute distress. The vital signs are documented above. CARDIAC: There is a regular rate and rhythm.  VASCULAR: I do not detect carotid bruits. I cannot palpate pedal pulses  however both feet are warm and well-perfused. He has hyperpigmentation bilaterally but no significant lower extremity swelling. PULMONARY: There is good air exchange bilaterally without wheezing or rales. ABDOMEN: Soft and non-tender with normal pitched bowel sounds.  MUSCULOSKELETAL: There are no major deformities or cyanosis. NEUROLOGIC: No focal weakness or paresthesias are detected. SKIN: There are no ulcers or rashes noted. PSYCHIATRIC: The patient has a normal affect.  DATA:    CAROTID DUPLEX: I have independently interpreted his carotid duplex scan today.  On the right side he has a 60 to 79% carotid stenosis.  Thus the stenosis has progressed some on the right.  On the left side he has a 60 to 79% carotid stenosis.  This is stable.  Both vertebral arteries are patent with antegrade flow.  MEDICAL ISSUES:   BILATERAL CAROTID DISEASE: This patient has asymptomatic bilateral 60 to 79% carotid stenoses.  I explained that I would not consider carotid endarterectomy unless the stenosis progressed to greater than 80% or he develop new neurologic symptoms.  He is not on aspirin because he is on Coumadin for atrial fibrillation.  He does not tolerate statins.  I have encouraged him to stay as active as possible.  We also discussed nutrition.  Fortunately he is not a smoker.  I have ordered a follow-up carotid duplex scan in 6 months and I will see him back at that time.  CHRONIC VENOUS INSUFFICIENCY: He does have evidence of CEAP clinical class IVa venous disease with hyperpigmentation.  He is unaware of any previous history of DVT.  I encouraged him to elevate his legs and avoid prolonged sitting and standing.  Deitra Mayo [facility] [phone]

## 2018-03-23 NOTE — Progress Notes (Signed)
Vitals:   03/23/18 1526 03/23/18 1530 03/23/18 1532  BP: (!) 141/75 137/67 131/69  Pulse: 68 68 68  Resp: 18    Temp: (!) 97.3 F (36.3 C)    TempSrc: Oral    SpO2: 97%    Weight: 187 lb (84.8 kg)    Height: 5\' 8"  (1.727 m)

## 2018-03-24 DIAGNOSIS — M1712 Unilateral primary osteoarthritis, left knee: Secondary | ICD-10-CM | POA: Diagnosis not present

## 2018-03-24 DIAGNOSIS — M1711 Unilateral primary osteoarthritis, right knee: Secondary | ICD-10-CM | POA: Diagnosis not present

## 2018-03-28 ENCOUNTER — Other Ambulatory Visit: Payer: Self-pay | Admitting: Interventional Cardiology

## 2018-03-28 DIAGNOSIS — M1711 Unilateral primary osteoarthritis, right knee: Secondary | ICD-10-CM | POA: Diagnosis not present

## 2018-03-28 DIAGNOSIS — M1712 Unilateral primary osteoarthritis, left knee: Secondary | ICD-10-CM | POA: Diagnosis not present

## 2018-03-30 DIAGNOSIS — M1711 Unilateral primary osteoarthritis, right knee: Secondary | ICD-10-CM | POA: Diagnosis not present

## 2018-03-30 DIAGNOSIS — M1712 Unilateral primary osteoarthritis, left knee: Secondary | ICD-10-CM | POA: Diagnosis not present

## 2018-04-04 DIAGNOSIS — M1711 Unilateral primary osteoarthritis, right knee: Secondary | ICD-10-CM | POA: Diagnosis not present

## 2018-04-04 DIAGNOSIS — M1712 Unilateral primary osteoarthritis, left knee: Secondary | ICD-10-CM | POA: Diagnosis not present

## 2018-04-11 ENCOUNTER — Other Ambulatory Visit: Payer: Self-pay

## 2018-04-11 DIAGNOSIS — I6523 Occlusion and stenosis of bilateral carotid arteries: Secondary | ICD-10-CM

## 2018-04-21 ENCOUNTER — Ambulatory Visit (INDEPENDENT_AMBULATORY_CARE_PROVIDER_SITE_OTHER): Payer: Medicare Other

## 2018-04-21 DIAGNOSIS — I4891 Unspecified atrial fibrillation: Secondary | ICD-10-CM | POA: Diagnosis not present

## 2018-04-21 DIAGNOSIS — Z5181 Encounter for therapeutic drug level monitoring: Secondary | ICD-10-CM | POA: Diagnosis not present

## 2018-04-21 LAB — POCT INR: INR: 3.1 — AB (ref 2.0–3.0)

## 2018-04-21 NOTE — Patient Instructions (Signed)
Description   Skip tomorrow's dosage of Coumadin, then resume same dosage 1 tablet on all days except 1/2 tablet on Mondays, Wednesdays and Fridays. Recheck INR in 5 weeks.  Call our office if placed on any new medications or if you have any procedures [phone].

## 2018-04-28 ENCOUNTER — Ambulatory Visit (INDEPENDENT_AMBULATORY_CARE_PROVIDER_SITE_OTHER): Payer: Medicare Other | Admitting: *Deleted

## 2018-04-28 DIAGNOSIS — I495 Sick sinus syndrome: Secondary | ICD-10-CM | POA: Diagnosis not present

## 2018-04-28 DIAGNOSIS — R001 Bradycardia, unspecified: Secondary | ICD-10-CM

## 2018-04-28 NOTE — Progress Notes (Signed)
Remote pacemaker transmission.   

## 2018-05-05 DIAGNOSIS — Z8546 Personal history of malignant neoplasm of prostate: Secondary | ICD-10-CM | POA: Diagnosis not present

## 2018-05-11 DIAGNOSIS — Z8546 Personal history of malignant neoplasm of prostate: Secondary | ICD-10-CM | POA: Diagnosis not present

## 2018-05-23 DIAGNOSIS — H401133 Primary open-angle glaucoma, bilateral, severe stage: Secondary | ICD-10-CM | POA: Diagnosis not present

## 2018-05-24 LAB — CUP PACEART REMOTE DEVICE CHECK
Battery Impedance: 395 Ohm
Battery Remaining Longevity: 110 mo
Battery Voltage: 2.78 V
Brady Statistic RV Percent Paced: 56 %
Date Time Interrogation Session: 20190905134911
Implantable Lead Implant Date: 20040920
Implantable Lead Implant Date: 20040920
Implantable Lead Location: 753859
Implantable Lead Location: 753860
Implantable Lead Model: 5076
Implantable Lead Model: 5092
Implantable Pulse Generator Implant Date: 20131001
Lead Channel Impedance Value: 624 Ohm
Lead Channel Impedance Value: 67 Ohm
Lead Channel Pacing Threshold Amplitude: 0.625 V
Lead Channel Pacing Threshold Pulse Width: 0.4 ms
Lead Channel Setting Pacing Amplitude: 2.5 V
Lead Channel Setting Pacing Pulse Width: 0.4 ms
Lead Channel Setting Sensing Sensitivity: 4 mV

## 2018-05-25 ENCOUNTER — Ambulatory Visit (INDEPENDENT_AMBULATORY_CARE_PROVIDER_SITE_OTHER): Payer: Medicare Other | Admitting: *Deleted

## 2018-05-25 DIAGNOSIS — I4891 Unspecified atrial fibrillation: Secondary | ICD-10-CM | POA: Diagnosis not present

## 2018-05-25 DIAGNOSIS — Z5181 Encounter for therapeutic drug level monitoring: Secondary | ICD-10-CM

## 2018-05-25 LAB — POCT INR: INR: 2.9 (ref 2.0–3.0)

## 2018-05-25 NOTE — Patient Instructions (Signed)
Description   Continue taking 1 tablet on all days except 1/2 tablet on Mondays, Wednesdays and Fridays. Recheck INR in 6 weeks.  Call our office if placed on any new medications or if you have any procedures [phone].

## 2018-06-06 ENCOUNTER — Ambulatory Visit (INDEPENDENT_AMBULATORY_CARE_PROVIDER_SITE_OTHER): Payer: Medicare Other | Admitting: *Deleted

## 2018-06-06 DIAGNOSIS — I4891 Unspecified atrial fibrillation: Secondary | ICD-10-CM

## 2018-06-06 DIAGNOSIS — Z5181 Encounter for therapeutic drug level monitoring: Secondary | ICD-10-CM

## 2018-06-06 DIAGNOSIS — N501 Vascular disorders of male genital organs: Secondary | ICD-10-CM | POA: Diagnosis not present

## 2018-06-06 LAB — POCT INR: INR: 3.1 — AB (ref 2.0–3.0)

## 2018-06-06 NOTE — Patient Instructions (Signed)
Description   Do not take any Coumadin today then continue taking 1 tablet on all days except 1/2 tablet on Mondays, Wednesdays and Fridays. Recheck INR in 4 weeks.  Call our office if placed on any new medications or if you have any procedures [phone].

## 2018-07-06 ENCOUNTER — Ambulatory Visit (INDEPENDENT_AMBULATORY_CARE_PROVIDER_SITE_OTHER): Payer: Medicare Other | Admitting: *Deleted

## 2018-07-06 DIAGNOSIS — Z5181 Encounter for therapeutic drug level monitoring: Secondary | ICD-10-CM

## 2018-07-06 DIAGNOSIS — I4891 Unspecified atrial fibrillation: Secondary | ICD-10-CM

## 2018-07-06 LAB — POCT INR: INR: 2.9 (ref 2.0–3.0)

## 2018-07-06 NOTE — Patient Instructions (Addendum)
Description   Start taking 1/2 tablet daily except 1 tablet on Tuesday, Thursday, and Saturday. Recheck INR in 3 weeks.  Call our office if placed on any new medications or if you have any procedures [phone] fax # [phone].

## 2018-07-27 ENCOUNTER — Ambulatory Visit (INDEPENDENT_AMBULATORY_CARE_PROVIDER_SITE_OTHER): Payer: Medicare Other | Admitting: Pharmacist

## 2018-07-27 DIAGNOSIS — I4891 Unspecified atrial fibrillation: Secondary | ICD-10-CM

## 2018-07-27 DIAGNOSIS — Z5181 Encounter for therapeutic drug level monitoring: Secondary | ICD-10-CM | POA: Diagnosis not present

## 2018-07-27 LAB — POCT INR: INR: 3.2 — AB (ref 2.0–3.0)

## 2018-07-27 NOTE — Patient Instructions (Signed)
Description   Start taking 1/2 tablet daily except 1 tablet on Tuesday and Saturday. Recheck INR in 2 weeks.  Call our office if placed on any new medications or if you have any procedures [phone] fax # [phone].

## 2018-07-28 ENCOUNTER — Telehealth: Payer: Self-pay

## 2018-07-28 ENCOUNTER — Ambulatory Visit (INDEPENDENT_AMBULATORY_CARE_PROVIDER_SITE_OTHER): Payer: Medicare Other

## 2018-07-28 DIAGNOSIS — I495 Sick sinus syndrome: Secondary | ICD-10-CM | POA: Diagnosis not present

## 2018-07-28 NOTE — Progress Notes (Signed)
Remote pacemaker transmission.   

## 2018-07-28 NOTE — Telephone Encounter (Signed)
Spoke with pt and reminded pt of remote transmission that is due today. Pt verbalized understanding.   

## 2018-08-03 ENCOUNTER — Encounter: Payer: Self-pay | Admitting: Cardiology

## 2018-08-10 ENCOUNTER — Ambulatory Visit (INDEPENDENT_AMBULATORY_CARE_PROVIDER_SITE_OTHER): Payer: Medicare Other | Admitting: *Deleted

## 2018-08-10 DIAGNOSIS — Z5181 Encounter for therapeutic drug level monitoring: Secondary | ICD-10-CM | POA: Diagnosis not present

## 2018-08-10 DIAGNOSIS — I4891 Unspecified atrial fibrillation: Secondary | ICD-10-CM | POA: Diagnosis not present

## 2018-08-10 LAB — POCT INR: INR: 2.1 (ref 2.0–3.0)

## 2018-08-10 NOTE — Patient Instructions (Signed)
Description   Continue taking 1/2 tablet daily except 1 tablet on Tuesday and Saturday. Recheck INR in 3 weeks.  Call our office if placed on any new medications or if you have any procedures [phone] fax # [phone].

## 2018-08-30 ENCOUNTER — Ambulatory Visit (INDEPENDENT_AMBULATORY_CARE_PROVIDER_SITE_OTHER): Payer: Medicare Other

## 2018-08-30 DIAGNOSIS — Z5181 Encounter for therapeutic drug level monitoring: Secondary | ICD-10-CM

## 2018-08-30 DIAGNOSIS — I4891 Unspecified atrial fibrillation: Secondary | ICD-10-CM | POA: Diagnosis not present

## 2018-08-30 LAB — POCT INR: INR: 2.3 (ref 2.0–3.0)

## 2018-08-30 NOTE — Patient Instructions (Signed)
Description   Continue on same dosage 1/2 tablet daily except 1 tablet on Tuesdays and Saturdays. Recheck INR in 4 weeks.  Call our office if placed on any new medications or if you have any procedures [phone] fax # [phone].

## 2018-09-06 DIAGNOSIS — I2510 Atherosclerotic heart disease of native coronary artery without angina pectoris: Secondary | ICD-10-CM | POA: Diagnosis not present

## 2018-09-06 DIAGNOSIS — G629 Polyneuropathy, unspecified: Secondary | ICD-10-CM | POA: Diagnosis not present

## 2018-09-06 DIAGNOSIS — R0989 Other specified symptoms and signs involving the circulatory and respiratory systems: Secondary | ICD-10-CM | POA: Diagnosis not present

## 2018-09-06 DIAGNOSIS — Z950 Presence of cardiac pacemaker: Secondary | ICD-10-CM | POA: Diagnosis not present

## 2018-09-06 DIAGNOSIS — E785 Hyperlipidemia, unspecified: Secondary | ICD-10-CM | POA: Diagnosis not present

## 2018-09-06 DIAGNOSIS — N183 Chronic kidney disease, stage 3 (moderate): Secondary | ICD-10-CM | POA: Diagnosis not present

## 2018-09-06 DIAGNOSIS — I480 Paroxysmal atrial fibrillation: Secondary | ICD-10-CM | POA: Diagnosis not present

## 2018-09-06 DIAGNOSIS — I5030 Unspecified diastolic (congestive) heart failure: Secondary | ICD-10-CM | POA: Diagnosis not present

## 2018-09-06 DIAGNOSIS — I10 Essential (primary) hypertension: Secondary | ICD-10-CM | POA: Diagnosis not present

## 2018-09-06 DIAGNOSIS — E559 Vitamin D deficiency, unspecified: Secondary | ICD-10-CM | POA: Diagnosis not present

## 2018-09-06 DIAGNOSIS — M1711 Unilateral primary osteoarthritis, right knee: Secondary | ICD-10-CM | POA: Diagnosis not present

## 2018-09-06 DIAGNOSIS — Z79899 Other long term (current) drug therapy: Secondary | ICD-10-CM | POA: Diagnosis not present

## 2018-09-12 LAB — CUP PACEART REMOTE DEVICE CHECK
Battery Impedance: 419 Ohm
Battery Remaining Longevity: 108 mo
Battery Voltage: 2.78 V
Brady Statistic RV Percent Paced: 56 %
Date Time Interrogation Session: 20191205161805
Implantable Lead Implant Date: 20040920
Implantable Lead Implant Date: 20040920
Implantable Lead Location: 753859
Implantable Lead Location: 753860
Implantable Lead Model: 5076
Implantable Lead Model: 5092
Implantable Pulse Generator Implant Date: 20131001
Lead Channel Impedance Value: 637 Ohm
Lead Channel Impedance Value: 67 Ohm
Lead Channel Pacing Threshold Amplitude: 0.75 V
Lead Channel Pacing Threshold Pulse Width: 0.4 ms
Lead Channel Setting Pacing Amplitude: 2.5 V
Lead Channel Setting Pacing Pulse Width: 0.4 ms
Lead Channel Setting Sensing Sensitivity: 4 mV

## 2018-09-21 ENCOUNTER — Ambulatory Visit: Payer: Medicare Other | Admitting: Vascular Surgery

## 2018-09-21 ENCOUNTER — Encounter (HOSPITAL_COMMUNITY): Payer: Medicare Other

## 2018-09-22 DIAGNOSIS — H401123 Primary open-angle glaucoma, left eye, severe stage: Secondary | ICD-10-CM | POA: Diagnosis not present

## 2018-09-22 NOTE — Progress Notes (Signed)
Electrophysiology Office Note Date: 09/23/2018  ID:  Jonathan Warner, DOB [birth year], MRN [id number]  PCP: Josetta Huddle, MD Primary Cardiologist: [name] Electrophysiologist: [name]  CC: Pacemaker follow-up  Jonathan Warner is a 85 y.o. male seen today for [name].  He presents today for routine electrophysiology followup.  Since last being seen in our clinic, the patient reports doing very well.  He denies chest pain, palpitations, dyspnea, PND, orthopnea, nausea, vomiting, dizziness, syncope, edema, weight gain, or early satiety.  Device History: MDT dual chamber PPM implanted 2004 for SSS; gen change 2013   Past Medical History:  Diagnosis Date  . Arthritis   . BPH (benign prostatic hyperplasia)   . CAD (coronary artery disease)    s/p CABG 1991  . Carotid artery occlusion   . Cataract    b/l surgery  . Chronic kidney disease (CKD), stage III (moderate) (HCC)   . Diverticulosis   . DJD (degenerative joint disease)   . DJD (degenerative joint disease)   . GERD (gastroesophageal reflux disease)   . Glaucoma   . Hiatal hernia   . History of adenomatous polyp of colon 06/07/13   Last colonoscopy 06/27/13 showed small adenoma, no further testing due to advanced age.  [name] History of radiation therapy 07/25/2012-09/20/2012   prostate 7800 cGy 40 sessions, seminal vesicles 5600 cGy 40 sessions  . History of shingles   . Hypercholesteremia   . Hyperlipidemia   . Hypertension   . Inguinal hernia   . Osteomyelitis of forearm, right, acute ([place])    drainage x 2  . Persistent atrial fibrillation   . Prostate cancer ([place]) 04/27/12   Dr. Eulogio Ditch. Radiation therapy. Adenocarcinoma,gleason=3+4=7, 3+3=6,PSA=4.93  volume=51cc  . Pulmonary nodules    small rlung base nodule 4.77mm, scarring in lung bases  . 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 CARDIAC  CATHETERIZATION  06/04/09   left w/noted patent bypass grafts  . CATARACT EXTRACTION W/ INTRAOCULAR LENS IMPLANT  2011  . COLONOSCOPY  12/21/2007   hx polyps, diverticulosis  . COLONOSCOPY WITH PROPOFOL N/A 06/27/2013   Procedure: COLONOSCOPY WITH PROPOFOL;  Surgeon: Garlan Fair, MD;  Location: [facility] ENDOSCOPY;  Service: Endoscopy;  Laterality: N/A;  . CORONARY ARTERY BYPASS GRAFT  1991   x5; prior MI  . PACEMAKER GENERATOR CHANGE  05/24/12  . PACEMAKER INSERTION  05/14/03   MDT EnPulse implanted by Dr Leonia Reeves ddd  . PERMANENT PACEMAKER GENERATOR CHANGE N/A 05/24/2012   Procedure: PERMANENT PACEMAKER GENERATOR CHANGE;  Surgeon: Thompson Grayer, MD;  Location: [facility] CATH LAB;  Service: Cardiovascular;  Laterality: N/A;  . PROSTATE BIOPSY  04/27/12   Adenocarcinoma,gleason=3+3=6, 3+4,& 4+3,PSA=4.93,volume=51cc  . TUMOR REMOVAL     skin tumors removed    Current Outpatient Medications  Medication Sig Dispense Refill  . acetaminophen (TYLENOL) 500 MG tablet Take 1,000 mg by mouth every 6 (six) hours as needed for pain.     [name] amLODipine (NORVASC) 5 MG tablet Take 1 tablet (5 mg total) by mouth daily. 90 tablet 3  . brimonidine (ALPHAGAN) 0.2 % ophthalmic solution Place 1 drop into both eyes 3 (three) times daily.     . Cholecalciferol (VITAMIN D3) 2000 units TABS Take 1 tablet by mouth daily.     . dorzolamide (TRUSOPT) 2 % ophthalmic solution Place 1 drop into both eyes 3  times daily.    . furosemide (LASIX) 40 MG tablet TAKE 1 AND 1/2 TABLETS BY MOUTH EVERY DAY 135 tablet 3  . latanoprost (XALATAN) 0.005 % ophthalmic solution Apply 1 drop to eye at bedtime.     . Multiple Vitamin (MULTIVITAMIN) tablet Take 1 tablet by mouth daily.    . nitroGLYCERIN (NITROSTAT) 0.4 MG SL tablet Place 1 tablet (0.4 mg total) under the tongue every 5 (five) minutes as needed for chest pain. 25 tablet 1  . omeprazole (PRILOSEC) 20 MG capsule Take 20 mg by mouth daily.    . potassium chloride SA (K-DUR,KLOR-CON) 20  MEQ tablet TAKE 1 AND 1/2 TABLETS BY MOUTH EVERY DAY (NEEDS AN APPOINTMENT FOR FURTHER REFILLS, 1ST ATTEMPT) 135 tablet 3  . vitamin E 400 UNIT capsule Take 400 Units by mouth daily.    [name] warfarin (COUMADIN) 2.5 MG tablet TAKE AS DIRECTED BY THE COUMADIN CLINIC 90 tablet 1   No current facility-administered medications for this visit.     Allergies:   Crestor [rosuvastatin]   Social History: Social History   Socioeconomic History  . Marital status: Married    Spouse name: Not on file  . Number of children: 1  . Years of education: Not on file  . Highest education level: Not on file  Occupational History  . Occupation: Retired    Comment: Chief Financial Officer  Social Needs  . Financial resource strain: Not on file  . Food insecurity:    Worry: Not on file    Inability: Not on file  . Transportation needs:    Medical: Not on file    Non-medical: Not on file  Tobacco Use  . Smoking status: Former Smoker    Packs/day: 0.50    Years: 10.00    Pack years: 5.00    Types: Cigarettes    Last attempt to quit: 08/24/1981    Years since quitting: 37.1  . Smokeless tobacco: Never Used  Substance and Sexual Activity  . Alcohol use: No  . Drug use: No  . Sexual activity: Not on file  Lifestyle  . Physical activity:    Days per week: Not on file    Minutes per session: Not on file  . Stress: Not on file  Relationships  . Social connections:    Talks on phone: Not on file    Gets together: Not on file    Attends religious service: Not on file    Active member of club or organization: Not on file    Attends meetings of clubs or organizations: Not on file    Relationship status: Not on file  . Intimate partner violence:    Fear of current or ex partner: Not on file    Emotionally abused: Not on file    Physically abused: Not on file    Forced sexual activity: Not on file  Other Topics Concern  . Not on file  Social History Narrative   Retired Information systems manager.  Lives in [place].    Family History: Family History  Problem Relation Age of Onset  . Hypertension Mother   . Congestive Heart Failure Mother   . CAD Mother   . Alzheimer's disease Father   . CAD Brother   . Pancreatic cancer Brother   . CVA Brother   . Hypertension Sister        Has 6 sisters, many suffer from HTN  . Heart attack Brother   . Stroke Brother  Review of Systems: All other systems reviewed and are otherwise negative except as noted above.   Physical Exam: VS:  BP 130/74   Pulse 70   Ht 5\' 8"  (1.727 m)   Wt 187 lb 3.2 oz (84.9 kg)   SpO2 99%   BMI 28.46 kg/m  , BMI Body mass index is 28.46 kg/m.  GEN- The patient is elderly appearing, alert and oriented x 3 today.   HEENT: normocephalic, atraumatic; sclera clear, conjunctiva pink; hearing intact; oropharynx clear; neck supple  Lungs- Clear to ausculation bilaterally, normal work of breathing.  No wheezes, rales, rhonchi Heart- Irregular rate and rhythm  GI- soft, non-tender, non-distended, bowel sounds present  Extremities- no clubbing, cyanosis, or edema  MS- no significant deformity or atrophy Skin- warm and dry, no rash or lesion; PPM pocket well healed Psych- euthymic mood, full affect Neuro- strength and sensation are intact  PPM Interrogation- reviewed in detail today,  See PACEART report  EKG:  EKG is not ordered today.  Recent Labs: No results found for requested labs within last 8760 hours.   Wt Readings from Last 3 Encounters:  09/23/18 187 lb 3.2 oz (84.9 kg)  03/23/18 187 lb (84.8 kg)  01/03/18 189 lb 12.8 oz (86.1 kg)     Other studies Reviewed: Additional studies/ records that were reviewed today include: [name] and Dr Thompson Caul office notes   Assessment and Plan:  1.  Symptomatic bradycardia Normal PPM function See Pace Art report No changes today  2. Permanent atrial fibrillation Continue Warfarin for CHADS2VASC of 4 V rates  controlled Asymptomatic  3.  HTN Stable No change required today  4.  CAD No recent ischemic symptoms    Current medicines are reviewed at length with the patient today.   The patient does not have concerns regarding his medicines.  The following changes were made today:  none  Labs/ tests ordered today include: none Orders Placed This Encounter  Procedures  . CUP PACEART INCLINIC DEVICE CHECK     Disposition:   Follow up with [facility], me in 1 year    Signed, Chanetta Marshall, NP 09/23/2018 10:17 AM  [address] [place] [id number] [phone] (office) [phone] (fax)

## 2018-09-23 ENCOUNTER — Encounter: Payer: Self-pay | Admitting: Nurse Practitioner

## 2018-09-23 ENCOUNTER — Ambulatory Visit (INDEPENDENT_AMBULATORY_CARE_PROVIDER_SITE_OTHER): Payer: Medicare Other | Admitting: Nurse Practitioner

## 2018-09-23 ENCOUNTER — Ambulatory Visit (INDEPENDENT_AMBULATORY_CARE_PROVIDER_SITE_OTHER): Payer: Medicare Other

## 2018-09-23 VITALS — BP 130/74 | HR 70 | Ht 68.0 in | Wt 187.2 lb

## 2018-09-23 DIAGNOSIS — I25119 Atherosclerotic heart disease of native coronary artery with unspecified angina pectoris: Secondary | ICD-10-CM

## 2018-09-23 DIAGNOSIS — I4891 Unspecified atrial fibrillation: Secondary | ICD-10-CM | POA: Diagnosis not present

## 2018-09-23 DIAGNOSIS — I4821 Permanent atrial fibrillation: Secondary | ICD-10-CM

## 2018-09-23 DIAGNOSIS — I130 Hypertensive heart and chronic kidney disease with heart failure and stage 1 through stage 4 chronic kidney disease, or unspecified chronic kidney disease: Secondary | ICD-10-CM

## 2018-09-23 DIAGNOSIS — Z5181 Encounter for therapeutic drug level monitoring: Secondary | ICD-10-CM | POA: Diagnosis not present

## 2018-09-23 DIAGNOSIS — R001 Bradycardia, unspecified: Secondary | ICD-10-CM

## 2018-09-23 LAB — CUP PACEART INCLINIC DEVICE CHECK
Date Time Interrogation Session: 20200131101741
Implantable Lead Implant Date: 20040920
Implantable Lead Implant Date: 20040920
Implantable Lead Location: 753859
Implantable Lead Location: 753860
Implantable Lead Model: 5076
Implantable Lead Model: 5092
Implantable Pulse Generator Implant Date: 20131001

## 2018-09-23 LAB — POCT INR: INR: 2.1 (ref 2.0–3.0)

## 2018-09-23 NOTE — Patient Instructions (Signed)
Description   Continue on same dosage 1/2 tablet daily except 1 tablet on Tuesdays and Saturdays. Recheck INR in 6 weeks.  Call our office if placed on any new medications or if you have any procedures [phone] fax # [phone].

## 2018-09-23 NOTE — Patient Instructions (Signed)
Medication Instructions:  none If you need a refill on your cardiac medications before your next appointment, please call your pharmacy.   Lab work: none If you have labs (blood work) drawn today and your tests are completely normal, you will receive your results only by: [name] MyChart Message (if you have MyChart) OR . A paper copy in the mail If you have any lab test that is abnormal or we need to change your treatment, we will call you to review the results.  Testing/Procedures: none  Follow-Up: At [facility], you and your health needs are our priority.  As part of our continuing mission to provide you with exceptional heart care, we have created designated Provider Care Teams.  These Care Teams include your primary Cardiologist (physician) and Advanced Practice Providers (APPs -  Physician Assistants and Nurse Practitioners) who all work together to provide you with the care you need, when you need it. You will need a follow up appointment in 1 year.  Please call our office 2 months in advance to schedule this appointment.  You may see Chanetta Marshall or one of the following Engineer, manufacturing Providers on your designated Care Team:   Chanetta Marshall, NP . Tommye Standard, PA-C  Any Other Special Instructions Will Be Listed Below (If Applicable). Remote monitoring is used to monitor your Pacemaker  from home. This monitoring reduces the number of office visits required to check your device to one time per year. It allows [place] to keep an eye on the functioning of your device to ensure it is working properly. You are scheduled for a device check from home on 10/27/2018. You may send your transmission at any time that day. If you have a wireless device, the transmission will be sent automatically. After your physician reviews your transmission, you will receive a postcard with your next transmission date.

## 2018-09-26 DIAGNOSIS — H401133 Primary open-angle glaucoma, bilateral, severe stage: Secondary | ICD-10-CM | POA: Diagnosis not present

## 2018-09-28 ENCOUNTER — Other Ambulatory Visit: Payer: Self-pay

## 2018-09-28 ENCOUNTER — Ambulatory Visit (HOSPITAL_COMMUNITY)
Admission: RE | Admit: 2018-09-28 | Discharge: 2018-09-28 | Disposition: A | Discharge status: Home / Self Care | Payer: Medicare Other | Source: Ambulatory Visit | Attending: Vascular Surgery | Admitting: Vascular Surgery

## 2018-09-28 ENCOUNTER — Encounter: Payer: Self-pay | Admitting: Vascular Surgery

## 2018-09-28 ENCOUNTER — Ambulatory Visit (INDEPENDENT_AMBULATORY_CARE_PROVIDER_SITE_OTHER): Payer: Medicare Other | Admitting: Vascular Surgery

## 2018-09-28 VITALS — BP 129/68 | HR 66 | Temp 97.0°F | Resp 18 | Ht 69.0 in | Wt 187.0 lb

## 2018-09-28 DIAGNOSIS — I6523 Occlusion and stenosis of bilateral carotid arteries: Secondary | ICD-10-CM

## 2018-09-28 NOTE — Progress Notes (Signed)
Patient name: Jonathan Warner MRN: [id number] DOB: Jan 14, 1932 Sex: male  REASON FOR VISIT:   Follow-up of bilateral carotid disease.  HPI:   Jonathan Warner is a pleasant 84 y.o. male who I last saw on 03/23/2018.  I been following him with bilateral carotid disease.  At the time of his last visit he had a 60 to 79% right carotid stenosis.  On the left side he also had a 60 to 79% carotid stenosis.  He was asymptomatic.  He is not on aspirin because he is on Coumadin for atrial fibrillation.  He does not tolerate statins.  He comes in for a 41-month follow-up visit.  Since I saw him last, he denies any history of stroke, TIAs, expressive or receptive aphasia, or amaurosis fugax.  His only complaint is really some right knee pain which is chronic.  He has significant arthritis in the right knee which limits his activity some.  He would like to get more active as he has not been real active lately.  Past Medical History:  Diagnosis Date  . Arthritis   . BPH (benign prostatic hyperplasia)   . CAD (coronary artery disease)    s/p CABG 1991  . Carotid artery occlusion   . Cataract    b/l surgery  . Chronic kidney disease (CKD), stage III (moderate) (HCC)   . Diverticulosis   . DJD (degenerative joint disease)   . DJD (degenerative joint disease)   . GERD (gastroesophageal reflux disease)   . Glaucoma   . Hiatal hernia   . History of adenomatous polyp of colon 06/07/13   Last colonoscopy 06/27/13 showed small adenoma, no further testing due to advanced age.  [name] History of radiation therapy 07/25/2012-09/20/2012   prostate 7800 cGy 40 sessions, seminal vesicles 5600 cGy 40 sessions  . History of shingles   . Hypercholesteremia   . Hyperlipidemia   . Hypertension   . Inguinal hernia   . Osteomyelitis of forearm, right, acute ([place])    drainage x 2  . Persistent atrial fibrillation   . Prostate cancer ([place]) 04/27/12   Dr. Eulogio Ditch. Radiation therapy. Adenocarcinoma,gleason=3+4=7, 3+3=6,PSA=4.93   volume=51cc  . Pulmonary nodules    small rlung base nodule 4.71mm, scarring in lung bases  . 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 Hypertension Mother   . Congestive Heart Failure Mother   . CAD Mother   . Alzheimer's disease Father   . CAD Brother   . Pancreatic cancer Brother   . CVA Brother   . Hypertension Sister        Has 6 sisters, many suffer from HTN  . Heart attack Brother   . Stroke Brother     SOCIAL HISTORY: Social History   Tobacco Use  . Smoking status: Former Smoker    Packs/day: 0.50    Years: 10.00    Pack years: 5.00    Types: Cigarettes    Last attempt to quit: 08/24/1981    Years since quitting: 37.1  . Smokeless tobacco: Never Used  Substance Use Topics  . Alcohol use: No    Allergies  Allergen Reactions  . Rosuvastatin Other (See Comments)    Knee and leg pain Knee and leg pain    Current Outpatient Medications  Medication Sig Dispense Refill  . acetaminophen (TYLENOL) 500 MG  tablet Take 1,000 mg by mouth every 6 (six) hours as needed for pain.     [name] amLODipine (NORVASC) 5 MG tablet Take 1 tablet (5 mg total) by mouth daily. 90 tablet 3  . brimonidine (ALPHAGAN) 0.2 % ophthalmic solution Place 1 drop into both eyes 3 times daily.    . Cholecalciferol (VITAMIN D3) 2000 units TABS Take 1 tablet by mouth daily.     . dorzolamide (TRUSOPT) 2 % ophthalmic solution Place 1 drop into both eyes 3 times daily.    . furosemide (LASIX) 40 MG tablet TAKE 1 AND 1/2 TABLETS BY MOUTH EVERY DAY 135 tablet 3  . latanoprost (XALATAN) 0.005 % ophthalmic solution Apply 1 drop to eye at bedtime.     . Multiple Vitamin (MULTIVITAMIN) tablet Take 1 tablet by mouth daily.    . nitroGLYCERIN (NITROSTAT) 0.4 MG SL tablet Place 1 tablet (0.4 mg total) under the tongue every 5 (five) minutes as needed for chest pain. 25 tablet 1  . omeprazole  (PRILOSEC) 20 MG capsule Take 20 mg by mouth daily.    . potassium chloride SA (K-DUR,KLOR-CON) 20 MEQ tablet TAKE 1 AND 1/2 TABLETS BY MOUTH EVERY DAY (NEEDS AN APPOINTMENT FOR FURTHER REFILLS, 1ST ATTEMPT) 135 tablet 3  . vitamin E 400 UNIT capsule Take 400 Units by mouth daily.    [name] warfarin (COUMADIN) 2.5 MG tablet TAKE AS DIRECTED BY THE COUMADIN CLINIC 90 tablet 1   No current facility-administered medications for this visit.     REVIEW OF SYSTEMS:  [X]  denotes positive finding, [ ]  denotes negative finding Cardiac  Comments:  Chest pain or chest pressure:    Shortness of breath upon exertion:    Short of breath when lying flat:    Irregular heart rhythm:        Vascular    Pain in calf, thigh, or hip brought on by ambulation:    Pain in feet at night that wakes you up from your sleep:     Blood clot in your veins:    Leg swelling:         Pulmonary    Oxygen at home:    Productive cough:     Wheezing:         Neurologic    Sudden weakness in arms or legs:     Sudden numbness in arms or legs:     Sudden onset of difficulty speaking or slurred speech:    Temporary loss of vision in one eye:     Problems with dizziness:         Gastrointestinal    Blood in stool:     Vomited blood:         Genitourinary    Burning when urinating:     Blood in urine:        Psychiatric    Major depression:         Hematologic    Bleeding problems:    Problems with blood clotting too easily:        Skin    Rashes or ulcers:        Constitutional    Fever or chills:     PHYSICAL EXAM:   Vitals:   09/28/18 1455 09/28/18 1459  BP: (!) 160/78 129/68  Pulse: 68 66  Resp: 18   Temp: (!) 97 F (36.1 C)   TempSrc: Oral   SpO2: 97%   Weight: 187 lb (84.8 kg)   Height: 5\' 9"  (  1.753 m)     GENERAL: The patient is a well-nourished male, in no acute distress. The vital signs are documented above. CARDIAC: There is a regular rate and rhythm.  VASCULAR: I do not detect  carotid bruits. He has palpable posterior tibial pulses bilaterally. PULMONARY: There is good air exchange bilaterally without wheezing or rales. ABDOMEN: Soft and non-tender with normal pitched bowel sounds.  MUSCULOSKELETAL: There are no major deformities or cyanosis. NEUROLOGIC: No focal weakness or paresthesias are detected. SKIN: There are no ulcers or rashes noted. PSYCHIATRIC: The patient has a normal affect.  DATA:    CAROTID DUPLEX: I have independently interpreted his carotid duplex scan today.  On the right side he has a 40 to 59% carotid stenosis.  The right vertebral artery is patent with antegrade flow.  On the left side the patient has a 40 to 59% carotid stenosis.  He has antegrade flow in the left vertebral artery.  MEDICAL ISSUES:   BILATERAL CAROTID DISEASE: This patient has asymptomatic bilateral moderate carotid stenoses (40 to 59%).  Given the improvement in his carotid disease I think we can stretch his follow-up out to 1 year.  I will order follow-up carotid duplex scan at that time.  He understands we would not consider carotid endarterectomy unless the stenosis progressed to greater than 80% or he develop new neurologic symptoms.  I will see him back in 1 year.  He knows to call sooner if he has problems.  Deitra Mayo [facility] [phone]

## 2018-10-19 ENCOUNTER — Other Ambulatory Visit: Payer: Self-pay | Admitting: Interventional Cardiology

## 2018-10-27 ENCOUNTER — Ambulatory Visit (INDEPENDENT_AMBULATORY_CARE_PROVIDER_SITE_OTHER): Payer: Medicare Other | Admitting: *Deleted

## 2018-10-27 DIAGNOSIS — I495 Sick sinus syndrome: Secondary | ICD-10-CM | POA: Diagnosis not present

## 2018-10-28 LAB — CUP PACEART REMOTE DEVICE CHECK
Battery Impedance: 493 Ohm
Battery Remaining Longevity: 102 mo
Battery Voltage: 2.78 V
Brady Statistic RV Percent Paced: 48 %
Date Time Interrogation Session: 20200305134820
Implantable Lead Implant Date: 20040920
Implantable Lead Implant Date: 20040920
Implantable Lead Location: 753859
Implantable Lead Location: 753860
Implantable Lead Model: 5076
Implantable Lead Model: 5092
Implantable Pulse Generator Implant Date: 20131001
Lead Channel Impedance Value: 646 Ohm
Lead Channel Impedance Value: 67 Ohm
Lead Channel Pacing Threshold Amplitude: 0.625 V
Lead Channel Pacing Threshold Pulse Width: 0.4 ms
Lead Channel Setting Pacing Amplitude: 2.5 V
Lead Channel Setting Pacing Pulse Width: 0.4 ms
Lead Channel Setting Sensing Sensitivity: 4 mV

## 2018-11-04 ENCOUNTER — Ambulatory Visit (INDEPENDENT_AMBULATORY_CARE_PROVIDER_SITE_OTHER): Payer: Medicare Other | Admitting: Pharmacist

## 2018-11-04 ENCOUNTER — Other Ambulatory Visit: Payer: Self-pay

## 2018-11-04 DIAGNOSIS — I4891 Unspecified atrial fibrillation: Secondary | ICD-10-CM

## 2018-11-04 DIAGNOSIS — Z5181 Encounter for therapeutic drug level monitoring: Secondary | ICD-10-CM

## 2018-11-04 LAB — POCT INR: INR: 2.1 (ref 2.0–3.0)

## 2018-11-04 NOTE — Patient Instructions (Signed)
Description   Continue on same dosage 1/2 tablet daily except 1 tablet on Tuesdays and Saturdays. Recheck INR in 6 weeks. Call our office if placed on any new medications or if you have any procedures [phone] fax # [phone].

## 2018-11-04 NOTE — Progress Notes (Signed)
Remote pacemaker transmission.   

## 2018-11-07 ENCOUNTER — Encounter: Payer: Self-pay | Admitting: Cardiology

## 2018-11-11 DIAGNOSIS — S86912A Strain of unspecified muscle(s) and tendon(s) at lower leg level, left leg, initial encounter: Secondary | ICD-10-CM | POA: Diagnosis not present

## 2018-12-13 ENCOUNTER — Telehealth: Payer: Self-pay

## 2018-12-13 NOTE — Telephone Encounter (Signed)

## 2018-12-15 ENCOUNTER — Ambulatory Visit (INDEPENDENT_AMBULATORY_CARE_PROVIDER_SITE_OTHER): Payer: Medicare Other | Admitting: *Deleted

## 2018-12-15 ENCOUNTER — Other Ambulatory Visit: Payer: Self-pay

## 2018-12-15 DIAGNOSIS — Z5181 Encounter for therapeutic drug level monitoring: Secondary | ICD-10-CM | POA: Diagnosis not present

## 2018-12-15 DIAGNOSIS — I4891 Unspecified atrial fibrillation: Secondary | ICD-10-CM

## 2018-12-15 LAB — POCT INR: INR: 1.7 — AB (ref 2.0–3.0)

## 2018-12-15 NOTE — Patient Instructions (Addendum)
Description   Spoke with pt and instructed to take 1 tablet today then continue on same dosage 1/2 tablet daily except 1 tablet on Tuesdays and Saturdays. Recheck INR in 3-4 weeks. Call our office if placed on any new medications or if you have any procedures [phone] fax # [phone].

## 2019-01-02 NOTE — Progress Notes (Signed)
Virtual Visit via Video Note   This visit type was conducted due to national recommendations for restrictions regarding the COVID-19 Pandemic (e.g. social distancing) in an effort to limit this patient's exposure and mitigate transmission in our community.  Due to his co-morbid illnesses, this patient is at least at moderate risk for complications without adequate follow up.  This format is felt to be most appropriate for this patient at this time.  All issues noted in this document were discussed and addressed.  A limited physical exam was performed with this format.  Please refer to the patient's chart for his consent to telehealth for [facility].   Date:  01/02/2019   ID:  Jonathan Warner, DOB [birth year], MRN [id number]  Patient Location: Home Provider Location: Office  PCP:  Josetta Huddle, MD  Cardiologist:  No primary care provider on file.  Electrophysiologist:  None   Evaluation Performed:  Follow-Up Visit  Chief Complaint:  AF/Htn/CABG  History of Present Illness:    Jonathan Warner is a 85 y.o. male with  coronary artery disease with bypass surgery 1991, atrial fibrillation, tachycardia-bradycardia syndrome with [employer] DDD pacemaker, hyperlipidemia, and chronic limiting osteoarthritis.  Jonathan Warner feels well and has no complaints.  He denies syncope, angina, shortness of breath, edema, blood in his urine or stool, and neurological complaints.  In January his implanted device was working normally.  The patient does not have symptoms concerning for COVID-19 infection (fever, chills, cough, or new shortness of breath).    Past Medical History:  Diagnosis Date  . Arthritis   . BPH (benign prostatic hyperplasia)   . CAD (coronary artery disease)    s/p CABG 1991  . Carotid artery occlusion   . Cataract    b/l surgery  . Chronic kidney disease (CKD), stage III (moderate) (HCC)   . Diverticulosis   . DJD (degenerative joint disease)   . DJD (degenerative joint disease)   .  GERD (gastroesophageal reflux disease)   . Glaucoma   . Hiatal hernia   . History of adenomatous polyp of colon 06/07/13   Last colonoscopy 06/27/13 showed small adenoma, no further testing due to advanced age.  [name] History of radiation therapy 07/25/2012-09/20/2012   prostate 7800 cGy 40 sessions, seminal vesicles 5600 cGy 40 sessions  . History of shingles   . Hypercholesteremia   . Hyperlipidemia   . Hypertension   . Inguinal hernia   . Osteomyelitis of forearm, right, acute ([place])    drainage x 2  . Persistent atrial fibrillation   . Prostate cancer (Jonathan Warner) 04/27/12   Dr. Eulogio Ditch. Radiation therapy. Adenocarcinoma,gleason=3+4=7, 3+3=6,PSA=4.93  volume=51cc  . Pulmonary nodules    small rlung base nodule 4.49mm, scarring in lung bases  . 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 CARDIAC CATHETERIZATION  06/04/09   left w/noted patent bypass grafts  . CATARACT EXTRACTION W/ INTRAOCULAR LENS IMPLANT  2011  . COLONOSCOPY  12/21/2007   hx polyps, diverticulosis  . COLONOSCOPY WITH PROPOFOL N/A 06/27/2013   Procedure: COLONOSCOPY WITH PROPOFOL;  Surgeon: Garlan Fair, MD;  Location: [facility] ENDOSCOPY;  Service: Endoscopy;  Laterality: N/A;  . CORONARY ARTERY BYPASS GRAFT  1991   x5; prior MI  . PACEMAKER GENERATOR CHANGE  05/24/12  . PACEMAKER INSERTION  05/14/03   MDT EnPulse implanted by Dr Leonia Reeves ddd  .  PERMANENT PACEMAKER GENERATOR CHANGE N/A 05/24/2012   Procedure: PERMANENT PACEMAKER GENERATOR CHANGE;  Surgeon: Thompson Grayer, MD;  Location: [facility] CATH LAB;  Service: Cardiovascular;  Laterality: N/A;  . PROSTATE BIOPSY  04/27/12   Adenocarcinoma,gleason=3+3=6, 3+4,& 4+3,PSA=4.93,volume=51cc  . TUMOR REMOVAL     skin tumors removed     No outpatient medications have been marked as taking for the 01/03/19 encounter (Appointment) with Belva Crome, MD.      Allergies:   Rosuvastatin   Social History   Tobacco Use  . Smoking status: Former Smoker    Packs/day: 0.50    Years: 10.00    Pack years: 5.00    Types: Cigarettes    Last attempt to quit: 08/24/1981    Years since quitting: 37.3  . Smokeless tobacco: Never Used  Substance Use Topics  . Alcohol use: No  . Drug use: No     Family Hx: The patient's family history includes Alzheimer's disease in his father; CAD in his brother and mother; CVA in his brother; Congestive Heart Failure in his mother; Heart attack in his brother; Hypertension in his mother and sister; Pancreatic cancer in his brother; Stroke in his brother.  ROS:   Please see the history of present illness.    Bilateral knee arthritis prevents mobility to a significant degree. All other systems reviewed and are negative.   Prior CV studies:   The following studies were reviewed today:  No new imaging or functional data.  Labs/Other Tests and Data Reviewed:    EKG:  No ECG reviewed.  Recent Labs: No results found for requested labs within last 8760 hours.   Recent Lipid Panel Lab Results  Component Value Date/Time   CHOL 161 06/14/2013 09:55 AM   TRIG 250.0 (H) 06/14/2013 09:55 AM   HDL 34.70 (L) 06/14/2013 09:55 AM   CHOLHDL 5 06/14/2013 09:55 AM   LDLDIRECT 83.5 06/14/2013 09:55 AM    Wt Readings from Last 3 Encounters:  09/28/18 187 lb (84.8 kg)  09/23/18 187 lb 3.2 oz (84.9 kg)  03/23/18 187 lb (84.8 kg)     Objective:    Vital Signs:  There were no vitals taken for this visit.   VITAL SIGNS:  reviewed GEN:  no acute distress RESPIRATORY:  normal respiratory effort, symmetric expansion  ASSESSMENT & PLAN:    1. Permanent atrial fibrillation   2. Coronary artery disease involving native coronary artery of native heart with angina pectoris (HCC)   3. Carotid stenosis, bilateral   4. Tachy-brady syndrome ([place])   5. Chronic anticoagulation   6. Cardiac pacemaker in situ   7. 2019 novel  coronavirus disease (COVID-19)    PLAN:  1. Controlled rate. 2. Secondary risk prevention discussed as outlined below.  States that laboratory data has been done within the past 6 months by Dr. Mertha Finders.  Will request results. 3. No neurological complaints.  Continue secondary prevention. 4. Normal pacemaker function when evaluated over the past 6 months. 5. Coumadin therapy being managed without bleeding.  Overall education and awareness concerning primary/secondary risk prevention was discussed in detail: LDL less than 70, hemoglobin A1c less than 7, blood pressure target less than 130/80 mmHg, >150 minutes of moderate aerobic activity per week, avoidance of smoking, weight control (via diet and exercise), and continued surveillance/management of/for obstructive sleep apnea.   COVID-19 Education: The signs and symptoms of COVID-19 were discussed with the patient and how to seek care for testing (follow up with PCP or arrange  E-visit).  The importance of social distancing was discussed today.  Time:   Today, I have spent 15 minutes with the patient with telehealth technology discussing the above problems.     Medication Adjustments/Labs and Tests Ordered: Current medicines are reviewed at length with the patient today.  Concerns regarding medicines are outlined above.   Tests Ordered: No orders of the defined types were placed in this encounter.   Medication Changes: No orders of the defined types were placed in this encounter.   Disposition:  Follow up in 1 year(s)  Signed, Sinclair Grooms, MD  01/02/2019 3:36 PM    [facility]

## 2019-01-03 ENCOUNTER — Other Ambulatory Visit: Payer: Self-pay

## 2019-01-03 ENCOUNTER — Encounter: Payer: Self-pay | Admitting: Interventional Cardiology

## 2019-01-03 ENCOUNTER — Telehealth: Payer: Self-pay

## 2019-01-03 ENCOUNTER — Telehealth (INDEPENDENT_AMBULATORY_CARE_PROVIDER_SITE_OTHER): Payer: Medicare Other | Admitting: Interventional Cardiology

## 2019-01-03 VITALS — BP 139/76 | HR 85 | Ht 69.0 in | Wt 180.0 lb

## 2019-01-03 DIAGNOSIS — U071 COVID-19: Secondary | ICD-10-CM

## 2019-01-03 DIAGNOSIS — I4821 Permanent atrial fibrillation: Secondary | ICD-10-CM

## 2019-01-03 DIAGNOSIS — I495 Sick sinus syndrome: Secondary | ICD-10-CM

## 2019-01-03 DIAGNOSIS — I6523 Occlusion and stenosis of bilateral carotid arteries: Secondary | ICD-10-CM

## 2019-01-03 DIAGNOSIS — Z950 Presence of cardiac pacemaker: Secondary | ICD-10-CM

## 2019-01-03 DIAGNOSIS — Z7901 Long term (current) use of anticoagulants: Secondary | ICD-10-CM

## 2019-01-03 DIAGNOSIS — I25119 Atherosclerotic heart disease of native coronary artery with unspecified angina pectoris: Secondary | ICD-10-CM

## 2019-01-03 NOTE — Patient Instructions (Signed)

## 2019-01-03 NOTE — Telephone Encounter (Signed)
YOUR CARDIOLOGY TEAM HAS ARRANGED FOR AN E-VISIT FOR YOUR APPOINTMENT - PLEASE REVIEW IMPORTANT INFORMATION BELOW SEVERAL DAYS PRIOR TO YOUR APPOINTMENT  Due to the recent COVID-19 pandemic, we are transitioning in-person office visits to tele-medicine visits in an effort to decrease unnecessary exposure to our patients, their families, and staff. These visits are billed to your insurance just like a normal visit is. We also encourage you to sign up for MyChart if you have not already done so. You will need a smartphone if possible. For patients that do not have this, we can still complete the visit using a regular telephone but do prefer a smartphone to enable video when possible. You may have a family member that lives with you that can help. If possible, we also ask that you have a blood pressure cuff and scale at home to measure your blood pressure, heart rate and weight prior to your scheduled appointment. Patients with clinical needs that need an in-person evaluation and testing will still be able to come to the office if absolutely necessary. If you have any questions, feel free to call our office.     YOUR PROVIDER WILL BE USING THE FOLLOWING PLATFORM TO COMPLETE YOUR VISIT: Doximity  . IF USING MYCHART - How to Download the MyChart App to Your SmartPhone   - If Apple, go to App Store and type in MyChart in the search bar and download the app. If Android, ask patient to go to Google Play Store and type in MyChart in the search bar and download the app. The app is free but as with any other app downloads, your phone may require you to verify saved payment information or Apple/Android password.  - You will need to then log into the app with your MyChart username and password, and select [facility] as your healthcare provider to link the account.  - When it is time for your visit, go to the MyChart app, find appointments, and click Begin Video Visit. Be sure to Select Allow for your device to  access the Microphone and Camera for your visit. You will then be connected, and your provider will be with you shortly.  **If you have any issues connecting or need assistance, please contact MyChart service desk [phone] ([phone])**  **If using a computer, in order to ensure the best quality for your visit, you will need to use either of the following Internet Browsers: Google Chrome or Microsoft Edge**  . IF USING DOXIMITY or DOXY.ME - The staff will give you instructions on receiving your link to join the meeting the day of your visit.      2-3 DAYS BEFORE YOUR APPOINTMENT  You will receive a telephone call from one of our [facility] team members - your caller ID may say "Unknown caller." If this is a video visit, we will walk you through how to get the video launched on your phone. We will remind you check your blood pressure, heart rate and weight prior to your scheduled appointment. If you have an Apple Watch or Kardia, please upload any pertinent ECG strips the day before or morning of your appointment to MyChart. Our staff will also make sure you have reviewed the consent and agree to move forward with your scheduled tele-health visit.     THE DAY OF YOUR APPOINTMENT  Approximately 15 minutes prior to your scheduled appointment, you will receive a telephone call from one of HeartCare team - your caller ID may say "Unknown caller."    Our staff will confirm medications, vital signs for the day and any symptoms you may be experiencing. Please have this information available prior to the time of visit start. It may also be helpful for you to have a pad of paper and pen handy for any instructions given during your visit. They will also walk you through joining the smartphone meeting if this is a video visit.    CONSENT FOR TELE-HEALTH VISIT - PLEASE REVIEW  I hereby voluntarily request, consent and authorize [facility] and its employed or contracted physicians, physician  assistants, nurse practitioners or other licensed health care professionals (the Practitioner), to provide me with telemedicine health care services (the "Services") as deemed necessary by the treating Practitioner. I acknowledge and consent to receive the Services by the Practitioner via telemedicine. I understand that the telemedicine visit will involve communicating with the Practitioner through live audiovisual communication technology and the disclosure of certain medical information by electronic transmission. I acknowledge that I have been given the opportunity to request an in-person assessment or other available alternative prior to the telemedicine visit and am voluntarily participating in the telemedicine visit.  I understand that I have the right to withhold or withdraw my consent to the use of telemedicine in the course of my care at any time, without affecting my right to future care or treatment, and that the Practitioner or I may terminate the telemedicine visit at any time. I understand that I have the right to inspect all information obtained and/or recorded in the course of the telemedicine visit and may receive copies of available information for a reasonable fee.  I understand that some of the potential risks of receiving the Services via telemedicine include:  . Delay or interruption in medical evaluation due to technological equipment failure or disruption; . Information transmitted may not be sufficient (e.g. poor resolution of images) to allow for appropriate medical decision making by the Practitioner; and/or  . In rare instances, security protocols could fail, causing a breach of personal health information.  Furthermore, I acknowledge that it is my responsibility to provide information about my medical history, conditions and care that is complete and accurate to the best of my ability. I acknowledge that Practitioner's advice, recommendations, and/or decision may be based on  factors not within their control, such as incomplete or inaccurate data provided by me or distortions of diagnostic images or specimens that may result from electronic transmissions. I understand that the practice of medicine is not an exact science and that Practitioner makes no warranties or guarantees regarding treatment outcomes. I acknowledge that I will receive a copy of this consent concurrently upon execution via email to the email address I last provided but may also request a printed copy by calling the office of [facility].    I understand that my insurance will be billed for this visit.   I have read or had this consent read to me. . I understand the contents of this consent, which adequately explains the benefits and risks of the Services being provided via telemedicine.  . I have been provided ample opportunity to ask questions regarding this consent and the Services and have had my questions answered to my satisfaction. . I give my informed consent for the services to be provided through the use of telemedicine in my medical care  By participating in this telemedicine visit I agree to the above.  

## 2019-01-04 DIAGNOSIS — I5030 Unspecified diastolic (congestive) heart failure: Secondary | ICD-10-CM | POA: Diagnosis not present

## 2019-01-04 DIAGNOSIS — I480 Paroxysmal atrial fibrillation: Secondary | ICD-10-CM | POA: Diagnosis not present

## 2019-01-04 DIAGNOSIS — M7522 Bicipital tendinitis, left shoulder: Secondary | ICD-10-CM | POA: Diagnosis not present

## 2019-01-04 DIAGNOSIS — I2510 Atherosclerotic heart disease of native coronary artery without angina pectoris: Secondary | ICD-10-CM | POA: Diagnosis not present

## 2019-01-04 DIAGNOSIS — Z950 Presence of cardiac pacemaker: Secondary | ICD-10-CM | POA: Diagnosis not present

## 2019-01-10 ENCOUNTER — Telehealth: Payer: Self-pay | Admitting: Interventional Cardiology

## 2019-01-10 NOTE — Telephone Encounter (Signed)
Medical records requested from [facility] 01/10/19 vlm

## 2019-01-11 ENCOUNTER — Telehealth: Payer: Self-pay

## 2019-01-11 ENCOUNTER — Other Ambulatory Visit: Payer: Self-pay | Admitting: Interventional Cardiology

## 2019-01-11 NOTE — Telephone Encounter (Signed)

## 2019-01-12 ENCOUNTER — Ambulatory Visit (INDEPENDENT_AMBULATORY_CARE_PROVIDER_SITE_OTHER): Payer: Medicare Other | Admitting: Pharmacist

## 2019-01-12 ENCOUNTER — Other Ambulatory Visit: Payer: Self-pay

## 2019-01-12 DIAGNOSIS — Z5181 Encounter for therapeutic drug level monitoring: Secondary | ICD-10-CM | POA: Diagnosis not present

## 2019-01-12 DIAGNOSIS — I4891 Unspecified atrial fibrillation: Secondary | ICD-10-CM

## 2019-01-12 LAB — POCT INR: INR: 2.6 (ref 2.0–3.0)

## 2019-01-26 ENCOUNTER — Ambulatory Visit (INDEPENDENT_AMBULATORY_CARE_PROVIDER_SITE_OTHER): Payer: Medicare Other | Admitting: *Deleted

## 2019-01-26 DIAGNOSIS — I4891 Unspecified atrial fibrillation: Secondary | ICD-10-CM

## 2019-01-26 DIAGNOSIS — I495 Sick sinus syndrome: Secondary | ICD-10-CM | POA: Diagnosis not present

## 2019-01-27 LAB — CUP PACEART REMOTE DEVICE CHECK
Battery Impedance: 516 Ohm
Battery Remaining Longevity: 102 mo
Battery Voltage: 2.78 V
Brady Statistic RV Percent Paced: 38 %
Date Time Interrogation Session: 20200604142740
Implantable Lead Implant Date: 20040920
Implantable Lead Implant Date: 20040920
Implantable Lead Location: 753859
Implantable Lead Location: 753860
Implantable Lead Model: 5076
Implantable Lead Model: 5092
Implantable Pulse Generator Implant Date: 20131001
Lead Channel Impedance Value: 646 Ohm
Lead Channel Impedance Value: 67 Ohm
Lead Channel Pacing Threshold Amplitude: 0.75 V
Lead Channel Pacing Threshold Pulse Width: 0.4 ms
Lead Channel Setting Pacing Amplitude: 2.5 V
Lead Channel Setting Pacing Pulse Width: 0.4 ms
Lead Channel Setting Sensing Sensitivity: 4 mV

## 2019-01-30 DIAGNOSIS — H401123 Primary open-angle glaucoma, left eye, severe stage: Secondary | ICD-10-CM | POA: Diagnosis not present

## 2019-01-30 DIAGNOSIS — H401113 Primary open-angle glaucoma, right eye, severe stage: Secondary | ICD-10-CM | POA: Diagnosis not present

## 2019-02-02 NOTE — Progress Notes (Signed)
Remote pacemaker transmission.   

## 2019-02-16 ENCOUNTER — Telehealth: Payer: Self-pay

## 2019-02-16 NOTE — Telephone Encounter (Signed)

## 2019-02-23 ENCOUNTER — Other Ambulatory Visit: Payer: Self-pay

## 2019-02-23 ENCOUNTER — Ambulatory Visit (INDEPENDENT_AMBULATORY_CARE_PROVIDER_SITE_OTHER): Payer: Medicare Other | Admitting: Pharmacist

## 2019-02-23 DIAGNOSIS — I4891 Unspecified atrial fibrillation: Secondary | ICD-10-CM

## 2019-02-23 DIAGNOSIS — Z5181 Encounter for therapeutic drug level monitoring: Secondary | ICD-10-CM

## 2019-02-23 LAB — POCT INR: INR: 1.7 — AB (ref 2.0–3.0)

## 2019-02-23 NOTE — Patient Instructions (Signed)
Description   Take an extra 1/2 tablet today, then continue taking 1/2 tablet daily except 1 tablet on Tuesdays and Saturdays. Recheck INR in 3 weeks. Call our office if placed on any new medications or if you have any procedures [phone] fax # [phone].

## 2019-03-06 DIAGNOSIS — H401113 Primary open-angle glaucoma, right eye, severe stage: Secondary | ICD-10-CM | POA: Diagnosis not present

## 2019-03-06 DIAGNOSIS — H401123 Primary open-angle glaucoma, left eye, severe stage: Secondary | ICD-10-CM | POA: Diagnosis not present

## 2019-03-08 DIAGNOSIS — H524 Presbyopia: Secondary | ICD-10-CM | POA: Diagnosis not present

## 2019-03-08 DIAGNOSIS — Z961 Presence of intraocular lens: Secondary | ICD-10-CM | POA: Diagnosis not present

## 2019-03-14 ENCOUNTER — Telehealth: Payer: Self-pay

## 2019-03-14 NOTE — Telephone Encounter (Signed)

## 2019-03-16 ENCOUNTER — Other Ambulatory Visit: Payer: Self-pay

## 2019-03-16 ENCOUNTER — Ambulatory Visit (INDEPENDENT_AMBULATORY_CARE_PROVIDER_SITE_OTHER): Payer: Medicare Other | Admitting: *Deleted

## 2019-03-16 DIAGNOSIS — Z7901 Long term (current) use of anticoagulants: Secondary | ICD-10-CM | POA: Diagnosis not present

## 2019-03-16 DIAGNOSIS — Z5181 Encounter for therapeutic drug level monitoring: Secondary | ICD-10-CM

## 2019-03-16 DIAGNOSIS — I4891 Unspecified atrial fibrillation: Secondary | ICD-10-CM | POA: Diagnosis not present

## 2019-03-16 LAB — POCT INR: INR: 1.9 — AB (ref 2.0–3.0)

## 2019-03-16 NOTE — Patient Instructions (Signed)
Description   Take 1 tablet today, then start  taking 1/2 tablet daily except 1 tablet on Tuesdays, Thursdays and Saturdays. Recheck INR in 3 weeks. Call our office if placed on any new medications or if you have any procedures [phone] fax # [phone].

## 2019-04-06 ENCOUNTER — Ambulatory Visit (INDEPENDENT_AMBULATORY_CARE_PROVIDER_SITE_OTHER): Payer: Medicare Other | Admitting: *Deleted

## 2019-04-06 ENCOUNTER — Other Ambulatory Visit: Payer: Self-pay

## 2019-04-06 DIAGNOSIS — Z5181 Encounter for therapeutic drug level monitoring: Secondary | ICD-10-CM | POA: Diagnosis not present

## 2019-04-06 DIAGNOSIS — I4891 Unspecified atrial fibrillation: Secondary | ICD-10-CM

## 2019-04-06 LAB — POCT INR: INR: 2 (ref 2.0–3.0)

## 2019-04-06 NOTE — Patient Instructions (Signed)
Description   Continue taking 1/2 tablet daily except 1 tablet on Tuesdays, Thursdays and Saturdays. Recheck INR in 3 weeks. Call our office if placed on any new medications or if you have any procedures [phone] fax # [phone].

## 2019-04-17 DIAGNOSIS — N400 Enlarged prostate without lower urinary tract symptoms: Secondary | ICD-10-CM | POA: Diagnosis not present

## 2019-04-17 DIAGNOSIS — E785 Hyperlipidemia, unspecified: Secondary | ICD-10-CM | POA: Diagnosis not present

## 2019-04-17 DIAGNOSIS — I2510 Atherosclerotic heart disease of native coronary artery without angina pectoris: Secondary | ICD-10-CM | POA: Diagnosis not present

## 2019-04-17 DIAGNOSIS — I480 Paroxysmal atrial fibrillation: Secondary | ICD-10-CM | POA: Diagnosis not present

## 2019-04-17 DIAGNOSIS — I10 Essential (primary) hypertension: Secondary | ICD-10-CM | POA: Diagnosis not present

## 2019-04-17 DIAGNOSIS — M159 Polyosteoarthritis, unspecified: Secondary | ICD-10-CM | POA: Diagnosis not present

## 2019-04-17 DIAGNOSIS — I252 Old myocardial infarction: Secondary | ICD-10-CM | POA: Diagnosis not present

## 2019-04-17 DIAGNOSIS — M1711 Unilateral primary osteoarthritis, right knee: Secondary | ICD-10-CM | POA: Diagnosis not present

## 2019-04-17 DIAGNOSIS — C61 Malignant neoplasm of prostate: Secondary | ICD-10-CM | POA: Diagnosis not present

## 2019-04-17 DIAGNOSIS — N183 Chronic kidney disease, stage 3 (moderate): Secondary | ICD-10-CM | POA: Diagnosis not present

## 2019-04-17 DIAGNOSIS — H409 Unspecified glaucoma: Secondary | ICD-10-CM | POA: Diagnosis not present

## 2019-04-17 DIAGNOSIS — I5030 Unspecified diastolic (congestive) heart failure: Secondary | ICD-10-CM | POA: Diagnosis not present

## 2019-04-25 DIAGNOSIS — Z0001 Encounter for general adult medical examination with abnormal findings: Secondary | ICD-10-CM | POA: Diagnosis not present

## 2019-04-25 DIAGNOSIS — N400 Enlarged prostate without lower urinary tract symptoms: Secondary | ICD-10-CM | POA: Diagnosis not present

## 2019-04-25 DIAGNOSIS — I5030 Unspecified diastolic (congestive) heart failure: Secondary | ICD-10-CM | POA: Diagnosis not present

## 2019-04-25 DIAGNOSIS — E559 Vitamin D deficiency, unspecified: Secondary | ICD-10-CM | POA: Diagnosis not present

## 2019-04-25 DIAGNOSIS — N183 Chronic kidney disease, stage 3 (moderate): Secondary | ICD-10-CM | POA: Diagnosis not present

## 2019-04-25 DIAGNOSIS — Z1389 Encounter for screening for other disorder: Secondary | ICD-10-CM | POA: Diagnosis not present

## 2019-04-25 DIAGNOSIS — I10 Essential (primary) hypertension: Secondary | ICD-10-CM | POA: Diagnosis not present

## 2019-04-25 DIAGNOSIS — I2510 Atherosclerotic heart disease of native coronary artery without angina pectoris: Secondary | ICD-10-CM | POA: Diagnosis not present

## 2019-04-25 DIAGNOSIS — H409 Unspecified glaucoma: Secondary | ICD-10-CM | POA: Diagnosis not present

## 2019-04-25 DIAGNOSIS — I480 Paroxysmal atrial fibrillation: Secondary | ICD-10-CM | POA: Diagnosis not present

## 2019-04-25 DIAGNOSIS — M159 Polyosteoarthritis, unspecified: Secondary | ICD-10-CM | POA: Diagnosis not present

## 2019-04-25 DIAGNOSIS — I739 Peripheral vascular disease, unspecified: Secondary | ICD-10-CM | POA: Diagnosis not present

## 2019-04-26 DIAGNOSIS — I480 Paroxysmal atrial fibrillation: Secondary | ICD-10-CM | POA: Diagnosis not present

## 2019-04-26 DIAGNOSIS — Z23 Encounter for immunization: Secondary | ICD-10-CM | POA: Diagnosis not present

## 2019-04-26 DIAGNOSIS — E559 Vitamin D deficiency, unspecified: Secondary | ICD-10-CM | POA: Diagnosis not present

## 2019-04-26 DIAGNOSIS — N183 Chronic kidney disease, stage 3 (moderate): Secondary | ICD-10-CM | POA: Diagnosis not present

## 2019-04-26 DIAGNOSIS — I10 Essential (primary) hypertension: Secondary | ICD-10-CM | POA: Diagnosis not present

## 2019-04-27 ENCOUNTER — Other Ambulatory Visit: Payer: Self-pay

## 2019-04-27 ENCOUNTER — Ambulatory Visit (INDEPENDENT_AMBULATORY_CARE_PROVIDER_SITE_OTHER): Payer: Medicare Other | Admitting: *Deleted

## 2019-04-27 DIAGNOSIS — Z5181 Encounter for therapeutic drug level monitoring: Secondary | ICD-10-CM

## 2019-04-27 DIAGNOSIS — I4891 Unspecified atrial fibrillation: Secondary | ICD-10-CM

## 2019-04-27 DIAGNOSIS — I495 Sick sinus syndrome: Secondary | ICD-10-CM | POA: Diagnosis not present

## 2019-04-27 LAB — CUP PACEART REMOTE DEVICE CHECK
Battery Impedance: 615 Ohm
Battery Remaining Longevity: 94 mo
Battery Voltage: 2.78 V
Brady Statistic RV Percent Paced: 37 %
Date Time Interrogation Session: 20200903130743
Implantable Lead Implant Date: 20040920
Implantable Lead Implant Date: 20040920
Implantable Lead Location: 753859
Implantable Lead Location: 753860
Implantable Lead Model: 5076
Implantable Lead Model: 5092
Implantable Pulse Generator Implant Date: 20131001
Lead Channel Impedance Value: 642 Ohm
Lead Channel Impedance Value: 67 Ohm
Lead Channel Pacing Threshold Amplitude: 0.75 V
Lead Channel Pacing Threshold Pulse Width: 0.4 ms
Lead Channel Setting Pacing Amplitude: 2.5 V
Lead Channel Setting Pacing Pulse Width: 0.4 ms
Lead Channel Setting Sensing Sensitivity: 4 mV

## 2019-04-27 LAB — POCT INR: INR: 2.2 (ref 2.0–3.0)

## 2019-04-27 NOTE — Patient Instructions (Signed)
Description   Continue taking 1/2 tablet daily except 1 tablet on Tuesdays, Thursdays and Saturdays. Recheck INR in 4 weeks. Call our office if placed on any new medications or if you have any procedures [phone] fax # [phone].

## 2019-04-30 ENCOUNTER — Other Ambulatory Visit: Payer: Self-pay | Admitting: Interventional Cardiology

## 2019-05-04 DIAGNOSIS — C61 Malignant neoplasm of prostate: Secondary | ICD-10-CM | POA: Diagnosis not present

## 2019-05-10 DIAGNOSIS — Z8546 Personal history of malignant neoplasm of prostate: Secondary | ICD-10-CM | POA: Diagnosis not present

## 2019-05-11 ENCOUNTER — Encounter: Payer: Self-pay | Admitting: Cardiology

## 2019-05-11 NOTE — Progress Notes (Signed)
Remote pacemaker transmission.   

## 2019-05-25 ENCOUNTER — Ambulatory Visit (INDEPENDENT_AMBULATORY_CARE_PROVIDER_SITE_OTHER): Payer: Medicare Other | Admitting: *Deleted

## 2019-05-25 ENCOUNTER — Other Ambulatory Visit: Payer: Self-pay

## 2019-05-25 DIAGNOSIS — I4891 Unspecified atrial fibrillation: Secondary | ICD-10-CM | POA: Diagnosis not present

## 2019-05-25 DIAGNOSIS — Z5181 Encounter for therapeutic drug level monitoring: Secondary | ICD-10-CM

## 2019-05-25 LAB — POCT INR: INR: 2.5 (ref 2.0–3.0)

## 2019-05-25 NOTE — Patient Instructions (Addendum)
Description   Continue taking 1/2 tablet daily except 1 tablet on Tuesdays, Thursdays and Saturdays. Recheck INR in 6 weeks. Call our office if placed on any new medications or if you have any procedures [phone] fax # [phone].

## 2019-05-29 DIAGNOSIS — H401133 Primary open-angle glaucoma, bilateral, severe stage: Secondary | ICD-10-CM | POA: Diagnosis not present

## 2019-06-05 DIAGNOSIS — H401123 Primary open-angle glaucoma, left eye, severe stage: Secondary | ICD-10-CM | POA: Diagnosis not present

## 2019-06-05 DIAGNOSIS — H401113 Primary open-angle glaucoma, right eye, severe stage: Secondary | ICD-10-CM | POA: Diagnosis not present

## 2019-07-07 ENCOUNTER — Ambulatory Visit (INDEPENDENT_AMBULATORY_CARE_PROVIDER_SITE_OTHER): Payer: Medicare Other | Admitting: *Deleted

## 2019-07-07 ENCOUNTER — Other Ambulatory Visit: Payer: Self-pay

## 2019-07-07 DIAGNOSIS — Z5181 Encounter for therapeutic drug level monitoring: Secondary | ICD-10-CM

## 2019-07-07 DIAGNOSIS — I4891 Unspecified atrial fibrillation: Secondary | ICD-10-CM

## 2019-07-07 LAB — POCT INR: INR: 2.4 (ref 2.0–3.0)

## 2019-07-07 NOTE — Patient Instructions (Addendum)
Description   Continue taking 1/2 tablet daily except 1 tablet on Tuesdays, Thursdays and Saturdays. Recheck INR in 7 weeks. Call our office if placed on any new medications or if you have any procedures [phone] fax # [phone].

## 2019-07-11 DIAGNOSIS — I5033 Acute on chronic diastolic (congestive) heart failure: Secondary | ICD-10-CM | POA: Diagnosis not present

## 2019-07-14 DIAGNOSIS — M7989 Other specified soft tissue disorders: Secondary | ICD-10-CM | POA: Diagnosis not present

## 2019-07-14 DIAGNOSIS — I5033 Acute on chronic diastolic (congestive) heart failure: Secondary | ICD-10-CM | POA: Diagnosis not present

## 2019-07-26 ENCOUNTER — Telehealth: Payer: Self-pay | Admitting: Interventional Cardiology

## 2019-07-26 DIAGNOSIS — I130 Hypertensive heart and chronic kidney disease with heart failure and stage 1 through stage 4 chronic kidney disease, or unspecified chronic kidney disease: Secondary | ICD-10-CM

## 2019-07-26 DIAGNOSIS — I25119 Atherosclerotic heart disease of native coronary artery with unspecified angina pectoris: Secondary | ICD-10-CM

## 2019-07-26 DIAGNOSIS — R2242 Localized swelling, mass and lump, left lower limb: Secondary | ICD-10-CM

## 2019-07-26 NOTE — Telephone Encounter (Signed)
Pt c/o swelling: STAT is pt has developed SOB within 24 hours  1) How much weight have you gained and in what time span? Gained 10 pounds according to Dr. Inda Merlin office but has lost the weight since Thanksgiving  2) If swelling, where is the swelling located? legs  3) Are you currently taking a fluid pill? Yes, increased fluid pill himself to a pill and a half.   4) Are you currently SOB? no  5) Do you have a log of your daily weights (if so, list)? no  6) Have you gained 3 pounds in a day or 5 pounds in a week? 10 pounds but not sure of the time span  7) Have you traveled recently? no   Patient states he thinks he needs to be seen soon. States he does not want to see an APP. The earliest appt for Dr. [name] would be in March.

## 2019-07-26 NOTE — Telephone Encounter (Signed)
Pt states he had significant swelling in chest and legs before Thanksgiving.  Was causing significant SOB.  Went to PCP office and seen Dr. Laurann Montana (Dr. Inda Merlin was out of the office).  Pt was up 10lbs.  He advised pt to take Furosemide 80mg  BID x 2 days and come back for follow up in a week.  When pt returned, swelling was much improved and weight was back down 10lbs.  They checked labs at that time and pt says they were fine.  Pt feeling much better now but states he has noticed his legs starting to swell again.  No weights or BPs.  Pt has been taking Furosemide 80mg  QD for a few days now since he noticed the swelling coming back. Pt also mentioned that for 2-3 days after Thanksgiving he noticed some brief episodes of CP.  Didn't last long and didn't warrant taking Nitro.  Noticed the pain radiated to his back between his shoulder blades.  Hasn't had any issues for the last few days.  Advised I will send message to Dr. [name] for review and advisement.

## 2019-07-27 ENCOUNTER — Ambulatory Visit (INDEPENDENT_AMBULATORY_CARE_PROVIDER_SITE_OTHER): Payer: Medicare Other | Admitting: *Deleted

## 2019-07-27 DIAGNOSIS — R001 Bradycardia, unspecified: Secondary | ICD-10-CM

## 2019-07-27 LAB — CUP PACEART REMOTE DEVICE CHECK
Battery Impedance: 639 Ohm
Battery Remaining Longevity: 92 mo
Battery Voltage: 2.78 V
Brady Statistic RV Percent Paced: 40 %
Date Time Interrogation Session: 20201203161615
Implantable Lead Implant Date: 20040920
Implantable Lead Implant Date: 20040920
Implantable Lead Location: 753859
Implantable Lead Location: 753860
Implantable Lead Model: 5076
Implantable Lead Model: 5092
Implantable Pulse Generator Implant Date: 20131001
Lead Channel Impedance Value: 655 Ohm
Lead Channel Pacing Threshold Amplitude: 0.75 V
Lead Channel Pacing Threshold Pulse Width: 0.4 ms
Lead Channel Setting Pacing Amplitude: 2.5 V
Lead Channel Setting Pacing Pulse Width: 0.4 ms
Lead Channel Setting Sensing Sensitivity: 4 mV

## 2019-07-27 MED ORDER — FUROSEMIDE 40 MG PO TABS: ORAL_TABLET | ORAL | 3 refills | Status: DC

## 2019-07-27 NOTE — Telephone Encounter (Signed)
Left message to call back  

## 2019-07-27 NOTE — Telephone Encounter (Signed)
Spoke with pt and swelling continues.  Reviewed recommendations.  Pt will come for labs on 12/11.  Pt verbalized understanding and was in agreement with plan.

## 2019-07-27 NOTE — Telephone Encounter (Signed)
Take 80 mg AM and 40 mg PM if swelling continuing. If change made, he will need BMET 1 week.

## 2019-08-04 ENCOUNTER — Other Ambulatory Visit: Payer: Medicare Other | Admitting: *Deleted

## 2019-08-04 ENCOUNTER — Other Ambulatory Visit: Payer: Self-pay

## 2019-08-04 DIAGNOSIS — I25119 Atherosclerotic heart disease of native coronary artery with unspecified angina pectoris: Secondary | ICD-10-CM | POA: Diagnosis not present

## 2019-08-04 DIAGNOSIS — I130 Hypertensive heart and chronic kidney disease with heart failure and stage 1 through stage 4 chronic kidney disease, or unspecified chronic kidney disease: Secondary | ICD-10-CM

## 2019-08-04 DIAGNOSIS — R2242 Localized swelling, mass and lump, left lower limb: Secondary | ICD-10-CM | POA: Diagnosis not present

## 2019-08-05 LAB — BASIC METABOLIC PANEL
BUN/Creatinine Ratio: 13 (ref 10–24)
BUN: 17 mg/dL (ref 8–27)
CO2: 29 mmol/L (ref 20–29)
Calcium: 9.5 mg/dL (ref 8.6–10.2)
Chloride: 100 mmol/L (ref 96–106)
Creatinine, Ser: 1.34 mg/dL — ABNORMAL HIGH (ref 0.76–1.27)
GFR calc Af Amer: 55 mL/min/{1.73_m2} — ABNORMAL LOW (ref >59)
GFR calc non Af Amer: 48 mL/min/{1.73_m2} — ABNORMAL LOW (ref >59)
Glucose: 109 mg/dL — ABNORMAL HIGH (ref 65–99)
Potassium: 3.5 mmol/L (ref 3.5–5.2)
Sodium: 144 mmol/L (ref 134–144)

## 2019-08-08 ENCOUNTER — Telehealth: Payer: Self-pay | Admitting: *Deleted

## 2019-08-08 MED ORDER — FUROSEMIDE 40 MG PO TABS: ORAL_TABLET | ORAL | Status: DC

## 2019-08-08 NOTE — Telephone Encounter (Signed)
Pt has been notified of lab results by phone with verbal understanding. Pt states as of yesterday he stopped taking the evening dose of lasix and is only taking the 2 tabs in the AM = 80 mg in the AM. Pt said his swelling is down and his legs look good. I asked pt if he weighs daily, pt answered no. I advised pt to weigh daily and to call the office if his weight is up 3 lb's x 1 day or 5 lb's x 1 week. Pt is agreeable to plan of care. Pt states he would like to know if he can change his coumadin to something else. Pt has appt with [facility] 08/23/19. I assured the pt that I will let Dr. [name] and his nurse Marveen Reeks RN aware of our conversation today. Pt thanked me for the call. I will reflect med list to how pt is taking Lasix. Patient notified of result.  Please refer to phone note from today for complete details.   Julaine Hua, [facility] 08/08/2019 8:33 AM

## 2019-08-10 NOTE — Telephone Encounter (Signed)
Tell him he must weigh every day under the same conditions. We must be kept informed if weight increases by 3 pounds overnight or 5 pounds in a week. He can speak with the pharmacist about conversion to a DOAC when he sees them later this month.

## 2019-08-10 NOTE — Telephone Encounter (Signed)
Spoke with pt and reviewed recommendations. Pt verbalized understanding and was in agreement with plan.

## 2019-08-21 DIAGNOSIS — N400 Enlarged prostate without lower urinary tract symptoms: Secondary | ICD-10-CM | POA: Diagnosis not present

## 2019-08-21 DIAGNOSIS — N1831 Chronic kidney disease, stage 3a: Secondary | ICD-10-CM | POA: Diagnosis not present

## 2019-08-21 DIAGNOSIS — H409 Unspecified glaucoma: Secondary | ICD-10-CM | POA: Diagnosis not present

## 2019-08-21 DIAGNOSIS — I10 Essential (primary) hypertension: Secondary | ICD-10-CM | POA: Diagnosis not present

## 2019-08-21 DIAGNOSIS — C61 Malignant neoplasm of prostate: Secondary | ICD-10-CM | POA: Diagnosis not present

## 2019-08-21 DIAGNOSIS — M159 Polyosteoarthritis, unspecified: Secondary | ICD-10-CM | POA: Diagnosis not present

## 2019-08-21 DIAGNOSIS — E785 Hyperlipidemia, unspecified: Secondary | ICD-10-CM | POA: Diagnosis not present

## 2019-08-21 DIAGNOSIS — I2510 Atherosclerotic heart disease of native coronary artery without angina pectoris: Secondary | ICD-10-CM | POA: Diagnosis not present

## 2019-08-21 DIAGNOSIS — I480 Paroxysmal atrial fibrillation: Secondary | ICD-10-CM | POA: Diagnosis not present

## 2019-08-21 DIAGNOSIS — I252 Old myocardial infarction: Secondary | ICD-10-CM | POA: Diagnosis not present

## 2019-08-21 DIAGNOSIS — I5030 Unspecified diastolic (congestive) heart failure: Secondary | ICD-10-CM | POA: Diagnosis not present

## 2019-08-22 NOTE — Progress Notes (Signed)
PPM remote 

## 2019-08-23 ENCOUNTER — Other Ambulatory Visit: Payer: Self-pay

## 2019-08-23 ENCOUNTER — Ambulatory Visit (INDEPENDENT_AMBULATORY_CARE_PROVIDER_SITE_OTHER): Payer: Medicare Other | Admitting: *Deleted

## 2019-08-23 DIAGNOSIS — Z5181 Encounter for therapeutic drug level monitoring: Secondary | ICD-10-CM

## 2019-08-23 DIAGNOSIS — I4891 Unspecified atrial fibrillation: Secondary | ICD-10-CM

## 2019-08-23 LAB — POCT INR: INR: 3 (ref 2.0–3.0)

## 2019-08-23 NOTE — Patient Instructions (Addendum)
Please let us know at the next visit if you would like to start the process to switch to Eliquis.  Description   Continue taking 1/2 tablet daily except 1 tablet on Tuesdays, Thursdays and Saturdays. Recheck INR in 8 weeks. Call our office if placed on any new medications or if you have any procedures [phone] fax # [phone].

## 2019-08-31 ENCOUNTER — Other Ambulatory Visit: Payer: Self-pay

## 2019-09-04 ENCOUNTER — Ambulatory Visit: Payer: Medicare Other | Attending: Internal Medicine

## 2019-09-04 DIAGNOSIS — Z23 Encounter for immunization: Secondary | ICD-10-CM | POA: Insufficient documentation

## 2019-09-04 NOTE — Progress Notes (Signed)
   Covid-19 Vaccination Clinic  Name:  Jonathan Warner    MRN: [id number] DOB: 1931-10-18  09/04/2019  [name] was observed post Covid-19 immunization for 15 minutes without incidence. He was provided with Vaccine Information Sheet and instruction to access the V-Safe system.   [name] was instructed to call 911 with any severe reactions post vaccine: [name] Difficulty breathing  . Swelling of your face and throat  . A fast heartbeat  . A bad rash all over your body  . Dizziness and weakness    Immunizations Administered    Name Date Dose VIS Date Route   Pfizer COVID-19 Vaccine 09/04/2019  8:49 AM 0.3 mL 08/04/2019 Intramuscular   Manufacturer: [place]   Lot: [id number]   [place]: [id number]

## 2019-09-06 ENCOUNTER — Other Ambulatory Visit: Payer: Self-pay | Admitting: Interventional Cardiology

## 2019-09-06 MED ORDER — AMLODIPINE BESYLATE 5 MG PO TABS: 5.0000 mg | ORAL_TABLET | Freq: Every day | ORAL | 1 refills | Status: DC

## 2019-09-06 MED ORDER — WARFARIN SODIUM 2.5 MG PO TABS: ORAL_TABLET | ORAL | 1 refills | Status: DC

## 2019-09-06 MED ORDER — FUROSEMIDE 40 MG PO TABS: ORAL_TABLET | ORAL | 1 refills | Status: DC

## 2019-09-06 MED ORDER — POTASSIUM CHLORIDE CRYS ER 20 MEQ PO TBCR: EXTENDED_RELEASE_TABLET | ORAL | 1 refills | Status: DC

## 2019-09-06 NOTE — Telephone Encounter (Signed)
Pt needed his warfarin sent to a different pharmacy. Please address

## 2019-09-18 ENCOUNTER — Telehealth: Payer: Self-pay | Admitting: *Deleted

## 2019-09-18 DIAGNOSIS — I10 Essential (primary) hypertension: Secondary | ICD-10-CM | POA: Diagnosis not present

## 2019-09-18 DIAGNOSIS — M159 Polyosteoarthritis, unspecified: Secondary | ICD-10-CM | POA: Diagnosis not present

## 2019-09-18 DIAGNOSIS — C61 Malignant neoplasm of prostate: Secondary | ICD-10-CM | POA: Diagnosis not present

## 2019-09-18 DIAGNOSIS — I5030 Unspecified diastolic (congestive) heart failure: Secondary | ICD-10-CM | POA: Diagnosis not present

## 2019-09-18 DIAGNOSIS — N400 Enlarged prostate without lower urinary tract symptoms: Secondary | ICD-10-CM | POA: Diagnosis not present

## 2019-09-18 DIAGNOSIS — E785 Hyperlipidemia, unspecified: Secondary | ICD-10-CM | POA: Diagnosis not present

## 2019-09-18 DIAGNOSIS — H409 Unspecified glaucoma: Secondary | ICD-10-CM | POA: Diagnosis not present

## 2019-09-18 DIAGNOSIS — I5033 Acute on chronic diastolic (congestive) heart failure: Secondary | ICD-10-CM | POA: Diagnosis not present

## 2019-09-18 DIAGNOSIS — I252 Old myocardial infarction: Secondary | ICD-10-CM | POA: Diagnosis not present

## 2019-09-18 DIAGNOSIS — N1831 Chronic kidney disease, stage 3a: Secondary | ICD-10-CM | POA: Diagnosis not present

## 2019-09-18 DIAGNOSIS — I480 Paroxysmal atrial fibrillation: Secondary | ICD-10-CM | POA: Diagnosis not present

## 2019-09-18 DIAGNOSIS — I2510 Atherosclerotic heart disease of native coronary artery without angina pectoris: Secondary | ICD-10-CM | POA: Diagnosis not present

## 2019-09-18 NOTE — Telephone Encounter (Signed)
Pt called to report that he was switched to Eliquis by his PCP. He asked if I would cancel his upcoming Anticoagulation appt and I did. He states Dr. Inda Merlin gave him all the instructions and that he will miss the Anticoagulation Team and will visit once he comes to see Dr. [name] in the future. Added Eliquis to medlist, removed Warfarin, and Episode of Care discontinued at this time.

## 2019-09-24 ENCOUNTER — Ambulatory Visit: Payer: Medicare Other | Attending: Internal Medicine

## 2019-09-24 DIAGNOSIS — Z23 Encounter for immunization: Secondary | ICD-10-CM | POA: Insufficient documentation

## 2019-09-24 NOTE — Progress Notes (Signed)
   Covid-19 Vaccination Clinic  Name:  Dardan CELESTER LECH    MRN: [id number] DOB: 1932-05-09  09/24/2019  Mr. Ordway was observed post Covid-19 immunization for 15 minutes without incidence. He was provided with Vaccine Information Sheet and instruction to access the V-Safe system.   Mr. Moctezuma was instructed to call 911 with any severe reactions post vaccine: [name] Difficulty breathing  . Swelling of your face and throat  . A fast heartbeat  . A bad rash all over your body  . Dizziness and weakness    Immunizations Administered    Name Date Dose VIS Date Route   Pfizer COVID-19 Vaccine 09/24/2019 12:55 PM 0.3 mL 08/04/2019 Intramuscular   Manufacturer: [place]   Lot: [id number]   [place]: [id number]

## 2019-10-03 ENCOUNTER — Other Ambulatory Visit: Payer: Self-pay

## 2019-10-03 ENCOUNTER — Ambulatory Visit (INDEPENDENT_AMBULATORY_CARE_PROVIDER_SITE_OTHER): Payer: Medicare Other

## 2019-10-03 ENCOUNTER — Encounter: Payer: Self-pay | Admitting: Orthopedic Surgery

## 2019-10-03 ENCOUNTER — Ambulatory Visit: Payer: Self-pay

## 2019-10-03 ENCOUNTER — Ambulatory Visit (INDEPENDENT_AMBULATORY_CARE_PROVIDER_SITE_OTHER): Payer: Medicare Other | Admitting: Orthopedic Surgery

## 2019-10-03 VITALS — Ht 69.0 in | Wt 180.0 lb

## 2019-10-03 DIAGNOSIS — M25562 Pain in left knee: Secondary | ICD-10-CM

## 2019-10-03 DIAGNOSIS — M25561 Pain in right knee: Secondary | ICD-10-CM

## 2019-10-03 DIAGNOSIS — G8929 Other chronic pain: Secondary | ICD-10-CM

## 2019-10-04 ENCOUNTER — Encounter: Payer: Self-pay | Admitting: Orthopedic Surgery

## 2019-10-04 DIAGNOSIS — M25561 Pain in right knee: Secondary | ICD-10-CM

## 2019-10-04 DIAGNOSIS — G8929 Other chronic pain: Secondary | ICD-10-CM

## 2019-10-04 MED ORDER — METHYLPREDNISOLONE ACETATE 40 MG/ML IJ SUSP
40.0000 mg | INTRAMUSCULAR | Status: AC | PRN
  Administered 2019-10-04: 40 mg via INTRA_ARTICULAR

## 2019-10-04 MED ORDER — LIDOCAINE HCL (PF) 1 % IJ SOLN
5.0000 mL | INTRAMUSCULAR | Status: AC | PRN
  Administered 2019-10-04: 5 mL

## 2019-10-04 NOTE — Progress Notes (Signed)
Office Visit Note   Patient: Jonathan Warner           Date of Birth: 05-26-1932           MRN: [id number] Visit Date: 10/03/2019              Requested by: Josetta Huddle, MD [address] [place],  [place] [id number] PCP: Josetta Huddle, MD  No chief complaint on file.     HPI: Patient is a 86 year old gentleman who presents with osteoarthritis both knees right worse than left.  Patient states that he had a steroid injection about 6 months ago which provided him temporary relief he states he currently uses a cane.  Patient is status post CABG for heart disease patient is on Eliquis for his atrial fibrillation and does have a pacemaker in place.  Assessment & Plan: Visit Diagnoses:  1. Chronic pain of left knee   2. Chronic pain of right knee     Plan: Patient's right knee was injected without complications.  Recommended patient follow-up with his cardiologist Dr. [name] for evaluation for total knee arthroplasty patient does need cardiac clearance.  With patient's heart disease ideally he would require a spinal anesthetic however this will be difficult with patient's anticoagulation for atrial fibrillation.  Follow-Up Instructions: Return if symptoms worsen or fail to improve.   Ortho Exam  Patient is alert, oriented, no adenopathy, well-dressed, normal affect, normal respiratory effort. Examination patient has a antalgic gait uses a cane.  He has osteoarthritis involving the PIP and DIP joints of both hands there is slight ulnar deviation of the MCP joints of both hands but no swelling at the MTP joints.  Patient has varus alignment of the right knee with standing.  Patient has crepitation range of motion of the right knee collaterals and cruciates are stable.  Medial lateral joint line are tender to palpation.  Imaging: XR Knee 1-2 Views Left  Result Date: 10/04/2019 2 view radiographs of the left knee shows medial joint space narrowing no osteophytic bone spurs no  subcondylar sclerosis no lytic changes  XR Knee 1-2 Views Right  Result Date: 10/04/2019 2 view radiographs of the right knee show subchondral sclerosis bone-on-bone contact in the medial joint line with vascular clips and calcification of the popliteal vessel  No images are attached to the encounter.  Labs: No results found for: HGBA1C, ESRSEDRATE, CRP, LABURIC, REPTSTATUS, GRAMSTAIN, CULT, LABORGA   No results found for: ALBUMIN, PREALBUMIN, LABURIC  Lab Results  Component Value Date   MG 2.0 07/31/2013   No results found for: VD25OH  No results found for: PREALBUMIN CBC EXTENDED Latest Ref Rng & Units 05/20/2012  WBC 4.0 - 10.5 K/uL 4.8  RBC 4.22 - 5.81 MIL/uL 3.90(L)  HGB 13.0 - 17.0 g/dL 13.2  HCT 39.0 - 52.0 % 38.3(L)  PLT 150 - 400 K/uL 160  NEUTROABS 1.7 - 7.7 K/uL 3.0  LYMPHSABS 0.7 - 4.0 K/uL 1.3     Body mass index is 26.58 kg/m.  Orders:  Orders Placed This Encounter  Procedures  . XR Knee 1-2 Views Left  . XR Knee 1-2 Views Right   No orders of the defined types were placed in this encounter.    Procedures: Large Joint Inj: R knee on 10/04/2019 2:05 PM Indications: pain and diagnostic evaluation Details: 22 G 1.5 in needle, anteromedial approach  Arthrogram: No  Medications: 5 mL lidocaine (PF) 1 %; 40 mg methylPREDNISolone acetate 40 MG/ML Outcome:  tolerated well, no immediate complications Procedure, treatment alternatives, risks and benefits explained, specific risks discussed. Consent was given by the patient. Immediately prior to procedure a time out was called to verify the correct patient, procedure, equipment, support staff and site/side marked as required. Patient was prepped and draped in the usual sterile fashion.      Clinical Data: No additional findings.  ROS:  All other systems negative, except as noted in the HPI. Review of Systems  Objective: Vital Signs: Ht 5\' 9"  (1.753 m)   Wt 180 lb (81.6 kg)   BMI 26.58 kg/m    Specialty Comments:  No specialty comments available.  PMFS History: Patient Active Problem List   Diagnosis Date Noted  . Coronary artery disease involving native coronary artery of native heart with angina pectoris ([place]) 11/06/2016  . Hypertensive heart and chronic kidney disease with heart failure and stage 1 through stage 4 chronic kidney disease, or chronic kidney disease ([place]) 11/06/2016  . Chronic anticoagulation 12/01/2013  . History of radiation therapy   . Prostate cancer ([place]) 05/31/2012  . Bradycardia 05/23/2012  . Atrial fibrillation ([place]) 05/23/2012   Past Medical History:  Diagnosis Date  . Arthritis   . BPH (benign prostatic hyperplasia)   . CAD (coronary artery disease)    s/p CABG 1991  . Carotid artery occlusion   . Cataract    b/l surgery  . Chronic kidney disease (CKD), stage III (moderate)   . Diverticulosis   . DJD (degenerative joint disease)   . DJD (degenerative joint disease)   . GERD (gastroesophageal reflux disease)   . Glaucoma   . Hiatal hernia   . History of adenomatous polyp of colon 06/07/13   Last colonoscopy 06/27/13 showed small adenoma, no further testing due to advanced age.  [name] History of radiation therapy 07/25/2012-09/20/2012   prostate 7800 cGy 40 sessions, seminal vesicles 5600 cGy 40 sessions  . History of shingles   . Hypercholesteremia   . Hyperlipidemia   . Hypertension   . Inguinal hernia   . Osteomyelitis of forearm, right, acute ([place])    drainage x 2  . Persistent atrial fibrillation ([place])   . Prostate cancer ([place]) 04/27/12   Dr. Eulogio Ditch. Radiation therapy. Adenocarcinoma,gleason=3+4=7, 3+3=6,PSA=4.93  volume=51cc  . Pulmonary nodules    small rlung base nodule 4.72mm, scarring in lung bases  . 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 Hypertension Mother   . Congestive Heart Failure Mother    . CAD Mother   . Alzheimer's disease Father   . CAD Brother   . Pancreatic cancer Brother   . CVA Brother   . Hypertension Sister        Has 6 sisters, many suffer from HTN  . Heart attack Brother   . Stroke Brother     Past Surgical History:  Procedure Laterality Date  . CARDIAC CATHETERIZATION  06/04/09   left w/noted patent bypass grafts  . CATARACT EXTRACTION W/ INTRAOCULAR LENS IMPLANT  2011  . COLONOSCOPY  12/21/2007   hx polyps, diverticulosis  . COLONOSCOPY WITH PROPOFOL N/A 06/27/2013   Procedure: COLONOSCOPY WITH PROPOFOL;  Surgeon: Garlan Fair, MD;  Location: [facility] ENDOSCOPY;  Service: Endoscopy;  Laterality: N/A;  . CORONARY ARTERY BYPASS GRAFT  1991   x5; prior MI  . PACEMAKER GENERATOR CHANGE  05/24/12  .  PACEMAKER INSERTION  05/14/03   MDT EnPulse implanted by Dr Leonia Reeves ddd  . PERMANENT PACEMAKER GENERATOR CHANGE N/A 05/24/2012   Procedure: PERMANENT PACEMAKER GENERATOR CHANGE;  Surgeon: Thompson Grayer, MD;  Location: [facility] CATH LAB;  Service: Cardiovascular;  Laterality: N/A;  . PROSTATE BIOPSY  04/27/12   Adenocarcinoma,gleason=3+3=6, 3+4,& 4+3,PSA=4.93,volume=51cc  . TUMOR REMOVAL     skin tumors removed   Social History   Occupational History  . Occupation: Retired    Comment: Theatre stage manager units  Tobacco Use  . Smoking status: Former Smoker    Packs/day: 0.50    Years: 10.00    Pack years: 5.00    Types: Cigarettes    Quit date: 08/24/1981    Years since quitting: 38.1  . Smokeless tobacco: Never Used  Substance and Sexual Activity  . Alcohol use: No  . Drug use: No  . Sexual activity: Not on file

## 2019-10-09 DIAGNOSIS — H401123 Primary open-angle glaucoma, left eye, severe stage: Secondary | ICD-10-CM | POA: Diagnosis not present

## 2019-10-09 DIAGNOSIS — H401113 Primary open-angle glaucoma, right eye, severe stage: Secondary | ICD-10-CM | POA: Diagnosis not present

## 2019-10-10 ENCOUNTER — Telehealth (HOSPITAL_COMMUNITY): Payer: Self-pay

## 2019-10-10 NOTE — Telephone Encounter (Signed)

## 2019-10-11 ENCOUNTER — Ambulatory Visit (HOSPITAL_COMMUNITY)
Admission: RE | Admit: 2019-10-11 | Discharge: 2019-10-11 | Disposition: A | Discharge status: Home / Self Care | Payer: Medicare Other | Source: Ambulatory Visit | Attending: Vascular Surgery | Admitting: Vascular Surgery

## 2019-10-11 ENCOUNTER — Encounter: Payer: Self-pay | Admitting: Vascular Surgery

## 2019-10-11 ENCOUNTER — Other Ambulatory Visit: Payer: Self-pay

## 2019-10-11 ENCOUNTER — Ambulatory Visit (INDEPENDENT_AMBULATORY_CARE_PROVIDER_SITE_OTHER): Payer: Medicare Other | Admitting: Vascular Surgery

## 2019-10-11 VITALS — BP 119/66 | HR 60 | Temp 97.5°F | Resp 20 | Ht 69.0 in | Wt 183.0 lb

## 2019-10-11 DIAGNOSIS — I872 Venous insufficiency (chronic) (peripheral): Secondary | ICD-10-CM

## 2019-10-11 DIAGNOSIS — I739 Peripheral vascular disease, unspecified: Secondary | ICD-10-CM | POA: Diagnosis not present

## 2019-10-11 DIAGNOSIS — I252 Old myocardial infarction: Secondary | ICD-10-CM | POA: Diagnosis not present

## 2019-10-11 DIAGNOSIS — I10 Essential (primary) hypertension: Secondary | ICD-10-CM | POA: Diagnosis not present

## 2019-10-11 DIAGNOSIS — N400 Enlarged prostate without lower urinary tract symptoms: Secondary | ICD-10-CM | POA: Diagnosis not present

## 2019-10-11 DIAGNOSIS — I5030 Unspecified diastolic (congestive) heart failure: Secondary | ICD-10-CM | POA: Diagnosis not present

## 2019-10-11 DIAGNOSIS — I6523 Occlusion and stenosis of bilateral carotid arteries: Secondary | ICD-10-CM | POA: Diagnosis not present

## 2019-10-11 DIAGNOSIS — M1711 Unilateral primary osteoarthritis, right knee: Secondary | ICD-10-CM | POA: Diagnosis not present

## 2019-10-11 DIAGNOSIS — C61 Malignant neoplasm of prostate: Secondary | ICD-10-CM | POA: Diagnosis not present

## 2019-10-11 DIAGNOSIS — I480 Paroxysmal atrial fibrillation: Secondary | ICD-10-CM | POA: Diagnosis not present

## 2019-10-11 DIAGNOSIS — M179 Osteoarthritis of knee, unspecified: Secondary | ICD-10-CM | POA: Diagnosis not present

## 2019-10-11 DIAGNOSIS — E785 Hyperlipidemia, unspecified: Secondary | ICD-10-CM | POA: Diagnosis not present

## 2019-10-11 DIAGNOSIS — N183 Chronic kidney disease, stage 3 unspecified: Secondary | ICD-10-CM | POA: Diagnosis not present

## 2019-10-11 DIAGNOSIS — I2510 Atherosclerotic heart disease of native coronary artery without angina pectoris: Secondary | ICD-10-CM | POA: Diagnosis not present

## 2019-10-11 DIAGNOSIS — I5033 Acute on chronic diastolic (congestive) heart failure: Secondary | ICD-10-CM | POA: Diagnosis not present

## 2019-10-11 NOTE — Progress Notes (Signed)
Patient name: Jonathan Warner MRN: [id number] DOB: 04/03/1932 Sex: male  REASON FOR VISIT:   Follow-up of bilateral carotid disease.  HPI:   Jonathan Warner is a pleasant 86 y.o. male who I been following with bilateral carotid disease.  He had bilateral 60 to 79% carotid stenoses.  When I saw him a year ago he had shown some improvement on both sides and was in the 40 to 59% category.  This reason I stretch his follow-up out to 1 year.  He comes in for 1 year follow-up visit.  Since I saw him last, he denies any history of stroke, TIAs, expressive or receptive aphasia, or amaurosis fugax.  He is on Eliquis for atrial fibrillation.  He is not on aspirin.  He is not on a statin.  He states that he was on Crestor in the past and that he blames a lot of his joint problems to this.  For this reason he is very reluctant to take a statin.  He is having significant problems with both knees but more significantly on the right side.  He has significant arthritis and is being considered for knee replacement.  I do not get any clear-cut history of claudication although I think his activity is fairly limited.  He denies any history of rest pain or nonhealing ulcers.  Past Medical History:  Diagnosis Date  . Arthritis   . BPH (benign prostatic hyperplasia)   . CAD (coronary artery disease)    s/p CABG 1991  . Carotid artery occlusion   . Cataract    b/l surgery  . Chronic kidney disease (CKD), stage III (moderate)   . Diverticulosis   . DJD (degenerative joint disease)   . DJD (degenerative joint disease)   . GERD (gastroesophageal reflux disease)   . Glaucoma   . Hiatal hernia   . History of adenomatous polyp of colon 06/07/13   Last colonoscopy 06/27/13 showed small adenoma, no further testing due to advanced age.  [name] History of radiation therapy 07/25/2012-09/20/2012   prostate 7800 cGy 40 sessions, seminal vesicles 5600 cGy 40 sessions  . History of shingles   . Hypercholesteremia   .  Hyperlipidemia   . Hypertension   . Inguinal hernia   . Osteomyelitis of forearm, right, acute ([place])    drainage x 2  . Persistent atrial fibrillation ([place])   . Prostate cancer ([place]) 04/27/12   Dr. Eulogio Ditch. Radiation therapy. Adenocarcinoma,gleason=3+4=7, 3+3=6,PSA=4.93  volume=51cc  . Pulmonary nodules    small rlung base nodule 4.64mm, scarring in lung bases  . 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 Hypertension Mother   . Congestive Heart Failure Mother   . CAD Mother   . Alzheimer's disease Father   . CAD Brother   . Pancreatic cancer Brother   . CVA Brother   . Hypertension Sister        Has 6 sisters, many suffer from HTN  . Heart attack Brother   . Stroke Brother     SOCIAL HISTORY: Social History   Tobacco Use  . Smoking status: Former Smoker    Packs/day: 0.50    Years: 10.00    Pack years: 5.00    Types: Cigarettes    Quit date: 08/24/1981    Years since quitting: 38.1  . Smokeless tobacco: Never Used  Substance Use Topics  . Alcohol use: No    Allergies  Allergen Reactions  . Pilocarpine Other (See Comments)    Made him feel very funny feeling.  . Rosuvastatin Other (See Comments)    Knee and leg pain Knee and leg pain    Current Outpatient Medications  Medication Sig Dispense Refill  . acetaminophen (TYLENOL) 500 MG tablet Take 1,000 mg by mouth every 6 (six) hours as needed for pain.     [name] amLODipine (NORVASC) 5 MG tablet Take 1 tablet (5 mg total) by mouth daily. Please make yearly appt with Dr. [name] for May for future refills. 1st attempt 90 tablet 1  . apixaban (ELIQUIS) 5 MG TABS tablet Take 5 mg by mouth 2 (two) times daily.    . brimonidine (ALPHAGAN) 0.2 % ophthalmic solution Place 1 drop into both eyes 3 times daily.    . Cholecalciferol (VITAMIN D3) 2000 units TABS Take 1 tablet by mouth daily.     . dorzolamide  (TRUSOPT) 2 % ophthalmic solution Place 1 drop into both eyes 3 times daily.    . furosemide (LASIX) 40 MG tablet Take 2 tablets every morning = 80 mg in the morning. Please make yearly appt with Dr. [name] for May for future refills. 1st attempt 180 tablet 1  . latanoprost (XALATAN) 0.005 % ophthalmic solution Apply 1 drop to eye at bedtime.     . Multiple Vitamin (MULTIVITAMIN) tablet Take 1 tablet by mouth daily.    . nitroGLYCERIN (NITROSTAT) 0.4 MG SL tablet PLACE 1 TABLET UNDER TONGUE EVERY FIVE MINUTES AS NEEDED FOR CHEST PAIN. 25 tablet 0  . omeprazole (PRILOSEC) 20 MG capsule Take 20 mg by mouth daily.    . potassium chloride SA (KLOR-CON) 20 MEQ tablet TAKE 1 AND 1/2 TABLETS BY MOUTH EVERY DAY. Please make yearly appt with Dr. [name] for May for future refills. 1st attempt 135 tablet 1  . vitamin E 400 UNIT capsule Take 400 Units by mouth daily.     No current facility-administered medications for this visit.    REVIEW OF SYSTEMS:  [X]  denotes positive finding, [ ]  denotes negative finding Cardiac  Comments:  Chest pain or chest pressure:    Shortness of breath upon exertion:    Short of breath when lying flat:    Irregular heart rhythm: x       Vascular    Pain in calf, thigh, or hip brought on by ambulation:    Pain in feet at night that wakes you up from your sleep:     Blood clot in your veins:    Leg swelling:  x       Pulmonary    Oxygen at home:    Productive cough:     Wheezing:         Neurologic    Sudden weakness in arms or legs:     Sudden numbness in arms or legs:     Sudden onset of difficulty speaking or slurred speech:    Temporary loss of vision in one eye:     Problems with dizziness:         Gastrointestinal    Blood in stool:     Vomited blood:         Genitourinary    Burning when urinating:     Blood in urine:        Psychiatric    Major depression:         Hematologic  Bleeding problems:    Problems with blood clotting too easily:          Skin    Rashes or ulcers:        Constitutional    Fever or chills:     PHYSICAL EXAM:   Vitals:   10/11/19 1551 10/11/19 1555  BP: 124/62 119/66  Pulse: 60   Resp: 20   Temp: (!) 97.5 F (36.4 C)   SpO2: 96%   Weight: 183 lb (83 kg)   Height: 5\' 9"  (1.753 m)     GENERAL: The patient is a well-nourished male, in no acute distress. The vital signs are documented above. CARDIAC: There is a regular rate and rhythm.  VASCULAR: I do not detect carotid bruits. He has palpable femoral pulses although they feel slightly diminished. I am unable to palpate pedal pulses however he has biphasic Doppler signals in the dorsalis pedis and posterior tibial positions bilaterally. He has no significant bilateral lower extremity swelling. He does have hyperpigmentation bilaterally. PULMONARY: There is good air exchange bilaterally without wheezing or rales. ABDOMEN: Soft and non-tender with normal pitched bowel sounds.  I do not palpate an abdominal aortic aneurysm. MUSCULOSKELETAL: There are no major deformities or cyanosis. NEUROLOGIC: No focal weakness or paresthesias are detected. SKIN: There are no ulcers or rashes noted. PSYCHIATRIC: The patient has a normal affect.  DATA:    CAROTID DUPLEX: I have independently interpreted his carotid duplex scan today.  On the right side he has a 60 to 79% carotid stenosis.  The right vertebral artery is patent with antegrade flow.  On the left side he has a 60 to 79% carotid stenosis.  The left vertebral artery is patent with antegrade flow.  LABS: I reviewed his labs from December of last year.  At that time his GFR was 48.  Creatinine was 1.34.  MEDICAL ISSUES:   BILATERAL CAROTID STENOSES: The patient has bilateral asymptomatic 60 to 79% carotid stenoses.  I explained that we would consider carotid endarterectomy if the stenosis progressed to greater than 80% or he develop new neurologic symptoms.  Given that the stenoses have progressed  compared to the study a year ago I have recommended a follow-up duplex scan in 1 year and I have ordered that study.  In addition I recommended that he begin taking 81 mg of aspirin daily.  We also discussed taking a low-dose statin however he feels quite strongly against this and attributes some of his joint problems to previous statin use.  I have explained that in patients with significant atherosclerosis including carotid artery stenosis aspirin and a low-dose statin have been shown to lower the risk of myocardial infarction and stroke.  PERIPHERAL VASCULAR DISEASE: Based on his exam I think he does have some underlying peripheral vascular disease.  If he is to be considered for a knee replacement he would require formal preoperative arterial Doppler tests.  I ordered ABIs when I see him back in 6 months.  CHRONIC VENOUS INSUFFICIENCY: Based on his physical exam he has CEAP C4a venous disease.  I have simply encouraged him to elevate his legs daily.  Deitra Mayo [facility] [phone]

## 2019-10-13 ENCOUNTER — Other Ambulatory Visit: Payer: Self-pay | Admitting: *Deleted

## 2019-10-13 DIAGNOSIS — I6523 Occlusion and stenosis of bilateral carotid arteries: Secondary | ICD-10-CM

## 2019-10-13 DIAGNOSIS — I739 Peripheral vascular disease, unspecified: Secondary | ICD-10-CM

## 2019-10-23 DIAGNOSIS — E785 Hyperlipidemia, unspecified: Secondary | ICD-10-CM | POA: Diagnosis not present

## 2019-10-23 DIAGNOSIS — I252 Old myocardial infarction: Secondary | ICD-10-CM | POA: Diagnosis not present

## 2019-10-23 DIAGNOSIS — I10 Essential (primary) hypertension: Secondary | ICD-10-CM | POA: Diagnosis not present

## 2019-10-23 DIAGNOSIS — D6869 Other thrombophilia: Secondary | ICD-10-CM | POA: Diagnosis not present

## 2019-10-23 DIAGNOSIS — I5030 Unspecified diastolic (congestive) heart failure: Secondary | ICD-10-CM | POA: Diagnosis not present

## 2019-10-23 DIAGNOSIS — M179 Osteoarthritis of knee, unspecified: Secondary | ICD-10-CM | POA: Diagnosis not present

## 2019-10-23 DIAGNOSIS — R0989 Other specified symptoms and signs involving the circulatory and respiratory systems: Secondary | ICD-10-CM | POA: Diagnosis not present

## 2019-10-23 DIAGNOSIS — N400 Enlarged prostate without lower urinary tract symptoms: Secondary | ICD-10-CM | POA: Diagnosis not present

## 2019-10-23 DIAGNOSIS — I480 Paroxysmal atrial fibrillation: Secondary | ICD-10-CM | POA: Diagnosis not present

## 2019-10-26 ENCOUNTER — Ambulatory Visit (INDEPENDENT_AMBULATORY_CARE_PROVIDER_SITE_OTHER): Payer: Medicare Other | Admitting: *Deleted

## 2019-10-26 DIAGNOSIS — R001 Bradycardia, unspecified: Secondary | ICD-10-CM

## 2019-10-27 LAB — CUP PACEART REMOTE DEVICE CHECK
Battery Impedance: 714 Ohm
Battery Remaining Longevity: 87 mo
Battery Voltage: 2.78 V
Brady Statistic RV Percent Paced: 41 %
Date Time Interrogation Session: 20210305103944
Implantable Lead Implant Date: 20040920
Implantable Lead Implant Date: 20040920
Implantable Lead Location: 753859
Implantable Lead Location: 753860
Implantable Lead Model: 5076
Implantable Lead Model: 5092
Implantable Pulse Generator Implant Date: 20131001
Lead Channel Impedance Value: 666 Ohm
Lead Channel Impedance Value: 67 Ohm
Lead Channel Pacing Threshold Amplitude: 0.75 V
Lead Channel Pacing Threshold Pulse Width: 0.4 ms
Lead Channel Setting Pacing Amplitude: 2.5 V
Lead Channel Setting Pacing Pulse Width: 0.4 ms
Lead Channel Setting Sensing Sensitivity: 4 mV

## 2019-10-27 NOTE — Progress Notes (Signed)
PPM Remote  

## 2019-11-08 NOTE — Progress Notes (Signed)
Electrophysiology Office Note Date: 11/10/2019  ID:  Jonathan Warner, DOB July 05, 1932, MRN [id number]  PCP: Josetta Huddle, MD Primary Cardiologist: [name] Electrophysiologist: [name]  CC: Pacemaker follow-up  Jonathan Warner is a 86 y.o. male seen today for [name].  He presents today for routine electrophysiology followup.  Since last being seen in our clinic, the patient reports doing relatively well.  He has had shortness of breath and exercise intolerance. He has been seen by Dr [name] this morning with echo planned.  He denies chest pain, palpitations, PND, orthopnea, nausea, vomiting, dizziness, syncope, edema, weight gain, or early satiety.  Device History: MDT dual chamber PPM implanted 2004 for SSS; gen change 2013  Past Medical History:  Diagnosis Date  . Arthritis   . BPH (benign prostatic hyperplasia)   . CAD (coronary artery disease)    s/p CABG 1991  . Carotid artery occlusion   . Cataract    b/l surgery  . Chronic kidney disease (CKD), stage III (moderate)   . Diverticulosis   . DJD (degenerative joint disease)   . DJD (degenerative joint disease)   . GERD (gastroesophageal reflux disease)   . Glaucoma   . Hiatal hernia   . History of adenomatous polyp of colon 06/07/13   Last colonoscopy 06/27/13 showed small adenoma, no further testing due to advanced age.  [name] History of radiation therapy 07/25/2012-09/20/2012   prostate 7800 cGy 40 sessions, seminal vesicles 5600 cGy 40 sessions  . History of shingles   . Hypercholesteremia   . Hyperlipidemia   . Hypertension   . Inguinal hernia   . Osteomyelitis of forearm, right, acute ([place])    drainage x 2  . Persistent atrial fibrillation ([place])   . Prostate cancer ([place]) 04/27/12   Dr. Eulogio Ditch. Radiation therapy. Adenocarcinoma,gleason=3+4=7, 3+3=6,PSA=4.93  volume=51cc  . Pulmonary nodules    small rlung base nodule 4.73mm, scarring in lung bases  . 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 Undescended  left testicle    absent left since birth   Past Surgical History:  Procedure Laterality Date  . CARDIAC CATHETERIZATION  06/04/09   left w/noted patent bypass grafts  . CATARACT EXTRACTION W/ INTRAOCULAR LENS IMPLANT  2011  . COLONOSCOPY  12/21/2007   hx polyps, diverticulosis  . COLONOSCOPY WITH PROPOFOL N/A 06/27/2013   Procedure: COLONOSCOPY WITH PROPOFOL;  Surgeon: Garlan Fair, MD;  Location: [facility];  Service: Endoscopy;  Laterality: N/A;  . CORONARY ARTERY BYPASS GRAFT  1991   x5; prior MI  . PACEMAKER GENERATOR CHANGE  05/24/12  . PACEMAKER INSERTION  05/14/03   MDT EnPulse implanted by Dr Leonia Reeves ddd  . PERMANENT PACEMAKER GENERATOR CHANGE N/A 05/24/2012   Procedure: PERMANENT PACEMAKER GENERATOR CHANGE;  Surgeon: Thompson Grayer, MD;  Location: [facility] CATH LAB;  Service: Cardiovascular;  Laterality: N/A;  . PROSTATE BIOPSY  04/27/12   Adenocarcinoma,gleason=3+3=6, 3+4,& 4+3,PSA=4.93,volume=51cc  . TUMOR REMOVAL     skin tumors removed    Current Outpatient Medications  Medication Sig Dispense Refill  . acetaminophen (TYLENOL) 500 MG tablet Take 1,000 mg by mouth every 6 (six) hours as needed for pain.     [name] amLODipine (NORVASC) 5 MG tablet Take 1 tablet (5 mg total) by mouth daily. Please make yearly appt with Dr. [name] for May for future refills. 1st attempt 90 tablet 1  . apixaban (ELIQUIS) 5 MG TABS tablet Take 5 mg by mouth 2 (two) times daily.    [name]  brimonidine (ALPHAGAN) 0.2 % ophthalmic solution Place 1 drop into both eyes 3 times daily.    . Cholecalciferol (VITAMIN D3) 2000 units TABS Take 1 tablet by mouth daily.     . dorzolamide (TRUSOPT) 2 % ophthalmic solution Place 1 drop into both eyes 3 times daily.    . furosemide (LASIX) 40 MG tablet Take 2 tablets every morning = 80 mg in the morning. Please make yearly appt with Dr. [name] for May for future refills. 1st attempt 180 tablet 1  . latanoprost (XALATAN) 0.005 % ophthalmic solution Apply 1 drop to eye at bedtime.      . Multiple Vitamin (MULTIVITAMIN) tablet Take 1 tablet by mouth daily.    . nitroGLYCERIN (NITROSTAT) 0.4 MG SL tablet PLACE 1 TABLET UNDER TONGUE EVERY FIVE MINUTES AS NEEDED FOR CHEST PAIN. 25 tablet 0  . omeprazole (PRILOSEC) 20 MG capsule Take 20 mg by mouth daily.    . potassium chloride SA (KLOR-CON) 20 MEQ tablet TAKE 1 AND 1/2 TABLETS BY MOUTH EVERY DAY. Please make yearly appt with Dr. [name] for May for future refills. 1st attempt 135 tablet 1  . vitamin E 400 UNIT capsule Take 400 Units by mouth daily.     No current facility-administered medications for this visit.    Allergies:   Pilocarpine and Rosuvastatin   Social History: Social History   Socioeconomic History  . Marital status: Married    Spouse name: Not on file  . Number of children: 1  . Years of education: Not on file  . Highest education level: Not on file  Occupational History  . Occupation: Retired    Comment: Theatre stage manager units  Tobacco Use  . Smoking status: Former Smoker    Packs/day: 0.50    Years: 10.00    Pack years: 5.00    Types: Cigarettes    Quit date: 08/24/1981    Years since quitting: 38.2  . Smokeless tobacco: Never Used  Substance and Sexual Activity  . Alcohol use: No  . Drug use: No  . Sexual activity: Not on file  Other Topics Concern  . 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   Social Determinants of Health   Financial Resource Strain:   . Difficulty of Paying Living Expenses:   Food Insecurity:   . Worried About Charity fundraiser in the Last Year:   . Arboriculturist in the Last Year:   Transportation Needs:   . Film/video editor (Medical):   [name] Lack of Transportation (Non-Medical):   Physical Activity:   . Days of Exercise per Week:   . Minutes of Exercise per Session:   Stress:   . Feeling of Stress :   Social Connections:   . Frequency of Communication with Friends and Family:   .  Frequency of Social Gatherings with Friends and Family:   . Attends Religious Services:   . Active Member of Clubs or Organizations:   . Attends Archivist Meetings:   [name] Marital Status:   Intimate Partner Violence:   . Fear of Current or Ex-Partner:   . Emotionally Abused:   [name] Physically Abused:   . Sexually Abused:     Family History: Family History  Problem Relation Age of Onset  . Hypertension Mother   . Congestive Heart Failure Mother   . CAD Mother   . Alzheimer's disease Father   . CAD Brother   .  Pancreatic cancer Brother   . CVA Brother   . Hypertension Sister        Has 6 sisters, many suffer from HTN  . Heart attack Brother   . Stroke Brother      Review of Systems: All other systems reviewed and are otherwise negative except as noted above.   Physical Exam: VS:  There were no vitals taken for this visit. , BMI There is no height or weight on file to calculate BMI.  GEN- The patient is elderly appearing, alert and oriented x 3 today.   HEENT: normocephalic, atraumatic; sclera clear, conjunctiva pink; hearing intact; oropharynx clear; neck supple  Lungs- Clear to ausculation bilaterally, normal work of breathing.  No wheezes, rales, rhonchi Heart- Irregular rate and rhythm  GI- soft, non-tender, non-distended, bowel sounds present  Extremities- no clubbing, cyanosis, or edema  MS- no significant deformity or atrophy Skin- warm and dry, no rash or lesion; PPM pocket well healed Psych- euthymic mood, full affect Neuro- strength and sensation are intact  PPM Interrogation- reviewed in detail today,  See PACEART report  EKG:  EKG is not ordered today.  Recent Labs: 08/04/2019: BUN 17; Creatinine, Ser 1.34; Potassium 3.5; Sodium 144   Wt Readings from Last 3 Encounters:  11/10/19 183 lb (83 kg)  10/11/19 183 lb (83 kg)  10/03/19 180 lb (81.6 kg)     Other studies Reviewed: Additional studies/ records that were reviewed today include: Dr  Thompson Caul office notes  Assessment and Plan:  1.  Symptomatic bradycardia Normal PPM function See Pace Art report No changes today  2.  Permanent atrial fibrillation Continue Warfarin for CHADS2VASC of 4 V rates controlled CBC followed by PCP   3.  HTN Stable No change required today  4.  CAD No recent ischemic symptoms Continue medical therapy    Current medicines are reviewed at length with the patient today.   The patient does not have concerns regarding his medicines.  The following changes were made today:  none  Labs/ tests ordered today include: none No orders of the defined types were placed in this encounter.    Disposition:   Follow up with remotes, me in 1 year     Signed, Chanetta Marshall, NP 11/10/2019 10:44 AM  [place] [address] [phone] (office) [phone] (fax)

## 2019-11-09 NOTE — Progress Notes (Signed)
Cardiology Office Note:    Date:  11/10/2019   ID:  Jonathan Warner, DOB [birth year], MRN [id number]  PCP:  Josetta Huddle, MD  Cardiologist:  No primary care provider on file.   Referring MD: Josetta Huddle, MD   Chief Complaint  Patient presents with  . Congestive Heart Failure    History of Present Illness:    Jonathan Warner is a 86 y.o. 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  He appears pale.  He had an episode of heart failure earlier this year that was treated with an increase in diuretic by Dr. Inda Merlin.  Increase weight and shortness of breath has improved, although not back to normal.  He still has some dyspnea on exertion.  He still has mild lower extremity swelling.  He never had orthopnea or PND.  He denies chest pain, orthopnea, palpitations, and syncope.  Still notes some lower extremity edema.    Past Medical History:  Diagnosis Date  . Arthritis   . BPH (benign prostatic hyperplasia)   . CAD (coronary artery disease)    s/p CABG 1991  . Carotid artery occlusion   . Cataract    b/l surgery  . Chronic kidney disease (CKD), stage III (moderate)   . Diverticulosis   . DJD (degenerative joint disease)   . DJD (degenerative joint disease)   . GERD (gastroesophageal reflux disease)   . Glaucoma   . Hiatal hernia   . History of adenomatous polyp of colon 06/07/13   Last colonoscopy 06/27/13 showed small adenoma, no further testing due to advanced age.  [name] History of radiation therapy 07/25/2012-09/20/2012   prostate 7800 cGy 40 sessions, seminal vesicles 5600 cGy 40 sessions  . History of shingles   . Hypercholesteremia   . Hyperlipidemia   . Hypertension   . Inguinal hernia   . Osteomyelitis of forearm, right, acute ([place])    drainage x 2  . Persistent atrial fibrillation ([place])   . Prostate cancer ([place])  04/27/12   Dr. Eulogio Ditch. Radiation therapy. Adenocarcinoma,gleason=3+4=7, 3+3=6,PSA=4.93  volume=51cc  . Pulmonary nodules    small rlung base nodule 4.86mm, scarring in lung bases  . 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 CARDIAC CATHETERIZATION  06/04/09   left w/noted patent bypass grafts  . CATARACT EXTRACTION W/ INTRAOCULAR LENS IMPLANT  2011  . COLONOSCOPY  12/21/2007   hx polyps, diverticulosis  . COLONOSCOPY WITH PROPOFOL N/A 06/27/2013   Procedure: COLONOSCOPY WITH PROPOFOL;  Surgeon: Garlan Fair, MD;  Location: [facility];  Service: Endoscopy;  Laterality: N/A;  . CORONARY ARTERY BYPASS GRAFT  1991   x5; prior MI  . PACEMAKER GENERATOR CHANGE  05/24/12  . PACEMAKER INSERTION  05/14/03   MDT EnPulse implanted by Dr Leonia Reeves ddd  . PERMANENT PACEMAKER GENERATOR CHANGE N/A 05/24/2012   Procedure: PERMANENT PACEMAKER GENERATOR CHANGE;  Surgeon: Thompson Grayer, MD;  [facility] LAB;  Service: Cardiovascular;  Laterality: N/A;  . PROSTATE BIOPSY  04/27/12   Adenocarcinoma,gleason=3+3=6, 3+4,& 4+3,PSA=4.93,volume=51cc  . TUMOR REMOVAL     skin tumors removed    Current Medications: Current Meds  Medication Sig  . acetaminophen (TYLENOL) 500 MG tablet Take 1,000 mg by mouth every 6 (six) hours as needed  for pain.   [name] amLODipine (NORVASC) 5 MG tablet Take 1 tablet (5 mg total) by mouth daily. Please make yearly appt with Dr. [name] for May for future refills. 1st attempt  . apixaban (ELIQUIS) 5 MG TABS tablet Take 5 mg by mouth 2 (two) times daily.  . brimonidine (ALPHAGAN) 0.2 % ophthalmic solution Place 1 drop into both eyes 3 times daily.  . Cholecalciferol (VITAMIN D3) 2000 units TABS Take 1 tablet by mouth daily.   . dorzolamide (TRUSOPT) 2 % ophthalmic solution Place 1 drop into both eyes 3 times daily.  . furosemide (LASIX) 40 MG tablet Take  2 tablets every morning = 80 mg in the morning. Please make yearly appt with Dr. [name] for May for future refills. 1st attempt  . latanoprost (XALATAN) 0.005 % ophthalmic solution Apply 1 drop to eye at bedtime.   . Multiple Vitamin (MULTIVITAMIN) tablet Take 1 tablet by mouth daily.  . nitroGLYCERIN (NITROSTAT) 0.4 MG SL tablet PLACE 1 TABLET UNDER TONGUE EVERY FIVE MINUTES AS NEEDED FOR CHEST PAIN.  [name] omeprazole (PRILOSEC) 20 MG capsule Take 20 mg by mouth daily.  . potassium chloride SA (KLOR-CON) 20 MEQ tablet TAKE 1 AND 1/2 TABLETS BY MOUTH EVERY DAY. Please make yearly appt with Dr. [name] for May for future refills. 1st attempt  . vitamin E 400 UNIT capsule Take 400 Units by mouth daily.     Allergies:   Pilocarpine and Rosuvastatin   Social History   Socioeconomic History  . Marital status: Married    Spouse name: Not on file  . Number of children: 1  . Years of education: Not on file  . Highest education level: Not on file  Occupational History  . Occupation: Retired    Comment: Theatre stage manager units  Tobacco Use  . Smoking status: Former Smoker    Packs/day: 0.50    Years: 10.00    Pack years: 5.00    Types: Cigarettes    Quit date: 08/24/1981    Years since quitting: 38.2  . Smokeless tobacco: Never Used  Substance and Sexual Activity  . Alcohol use: No  . Drug use: No  . Sexual activity: Not on file  Other Topics Concern  . 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   Social Determinants of Health   Financial Resource Strain:   . Difficulty of Paying Living Expenses:   Food Insecurity:   . Worried About Charity fundraiser in the Last Year:   . Arboriculturist in the Last Year:   Transportation Needs:   . Film/video editor (Medical):   [name] Lack of Transportation (Non-Medical):   Physical Activity:   . Days of Exercise per Week:   . Minutes of Exercise per Session:   Stress:   . Feeling of  Stress :   Social Connections:   . Frequency of Communication with Friends and Family:   . Frequency of Social Gatherings with Friends and Family:   . Attends Religious Services:   . Active Member of Clubs or Organizations:   . Attends Archivist Meetings:   [name] Marital Status:      Family History: The patient's family history includes Alzheimer's disease in his father; CAD in his brother and mother; CVA in his brother; Congestive Heart Failure in his mother; Heart attack in his brother; Hypertension in his mother and sister; Pancreatic cancer in his brother; Stroke in  his brother.  ROS:   Please see the history of present illness.    Concerned about a level of forgetfulness.  Bilateral knees are giving him difficulty and wants to know if he can tolerate surgery to have knee replacement.  All other systems reviewed and are negative.  EKGs/Labs/Other Studies Reviewed:    The following studies were reviewed today: He has not had a recent cardiac imaging study to assess LV function.  He did have an exercise tolerance test in August 2012.  Bilateral carotid Doppler 2021: Summary:  Right Carotid: Velocities in the right ICA are consistent with a 60-79%         stenosis,upper end of range.   Left Carotid: Velocities in the left ICA are consistent with a 60-79%  stenosis,        upper end of range.   Vertebrals: Left vertebral artery demonstrates antegrade flow. Right  vertebral        artery demonstrates high resistant antegrade flow.  Subclavians: Normal flow hemodynamics were seen in bilateral subclavian        arteries.          Note: calcific plaque may obscure higher velocity.   *See table(s) above for measurements and observations.      Electronically signed by Deitra Mayo MD on 10/11/2019 at 4:26:27  PM.     EKG:  EKG ventricular pacing at 87 bpm therefore suggesting that there is some level of atrial tracking.   An occasional PVC is noted.  Pacemaker interrogation performed today (please see separate note): 41% ventricular paced burden.  Recent Labs: 08/04/2019: BUN 17; Creatinine, Ser 1.34; Potassium 3.5; Sodium 144  Recent Lipid Panel    Component Value Date/Time   CHOL 161 06/14/2013 0955   TRIG 250.0 (H) 06/14/2013 0955   HDL 34.70 (L) 06/14/2013 0955   CHOLHDL 5 06/14/2013 0955   VLDL 50.0 (H) 06/14/2013 0955   LDLDIRECT 83.5 06/14/2013 0955    Physical Exam:    VS:  BP (!) 142/64   Pulse 87   Ht 5\' 9"  (1.753 m)   Wt 183 lb (83 kg)   SpO2 93%   BMI 27.02 kg/m     Wt Readings from Last 3 Encounters:  11/10/19 183 lb (83 kg)  10/11/19 183 lb (83 kg)  10/03/19 180 lb (81.6 kg)     GEN: Appears slightly pale. No acute distress HEENT: Normal NECK: No JVD. LYMPHATICS: No lymphadenopathy CARDIAC:  RRR without murmur, gallop, or edema. VASCULAR:  Normal Pulses. No bruits. RESPIRATORY:  Clear to auscultation without rales, wheezing or rhonchi  ABDOMEN: Soft, non-tender, non-distended, No pulsatile mass, MUSCULOSKELETAL: No deformity  SKIN: Warm and dry NEUROLOGIC:  Alert and oriented x 3 PSYCHIATRIC:  Normal affect   ASSESSMENT:    1. Atrial fibrillation, unspecified type ([place])   2. Hypertensive heart and chronic kidney disease with heart failure and stage 1 through stage 4 chronic kidney disease, or chronic kidney disease ([place])   3. Coronary artery disease involving native coronary artery of native heart with angina pectoris ([place])   4. Tachy-brady syndrome ([place])   5. Chronic anticoagulation   6. Cardiac pacemaker in situ   7. 2019 novel coronavirus disease (COVID-19)   8. Heart failure, type unknown ([place])   9. SOB (shortness of breath)    PLAN:    In order of problems listed above:  1. He is being V paced today with no obvious P waves at a rate of greater than  80 bpm raising the question of atrial tracking with long PR interval.  V pacing burden 41%. 2. Current  kidney status reveals creatinine of 1.34 in December 2020.  Current status not known especially after diuresis concerning the recent episode of heart failure. 3. No angina.  Secondary prevention discussed. 4. Pacemaker interrogated today.  41% V paced 5. Anticoagulation with Eliquis is being tolerated without bleeding.  Medication should be continued. 6. See #4 above. 7. COVID-19 vaccine has been received. 8. Suspect heart failure is acute on chronic diastolic.  Rule out systolic with 2D Doppler echocardiogram.  A BNP will also be obtained because he is short of breath.  Kidney function and hemoglobin are also being ordered. 9. BNP level.  Adjustment in diuretic regimen and other maneuvers to treat heart failure will be based upon database.     Medication Adjustments/Labs and Tests Ordered: Current medicines are reviewed at length with the patient today.  Concerns regarding medicines are outlined above.  Orders Placed This Encounter  Procedures  . CBC  . Basic metabolic panel  . Pro b natriuretic peptide  . Hepatic function panel  . EKG 12-Lead  . ECHOCARDIOGRAM COMPLETE   No orders of the defined types were placed in this encounter.   Patient Instructions  Medication Instructions:  Your physician recommends that you continue on your current medications as directed. Please refer to the Current Medication list given to you today.  *If you need a refill on your cardiac medications before your next appointment, please call your pharmacy*   Lab Work: CBC, Pro BNP, BMET, Liver today  If you have labs (blood work) drawn today and your tests are completely normal, you will receive your results only by: [name] MyChart Message (if you have MyChart) OR . A paper copy in the mail If you have any lab test that is abnormal or we need to change your treatment, we will call you to review the results.   Testing/Procedures: Your physician has requested that you have an echocardiogram.  Echocardiography is a painless test that uses sound waves to create images of your heart. It provides your doctor with information about the size and shape of your heart and how well your heart's chambers and valves are working. This procedure takes approximately one hour. There are no restrictions for this procedure.   Follow-Up: At [facility], you and your health needs are our priority.  As part of our continuing mission to provide you with exceptional heart care, we have created designated Provider Care Teams.  These Care Teams include your primary Cardiologist (physician) and Advanced Practice Providers (APPs -  Physician Assistants and Nurse Practitioners) who all work together to provide you with the care you need, when you need it.  We recommend signing up for the patient portal called "MyChart".  Sign up information is provided on this After Visit Summary.  MyChart is used to connect with patients for Virtual Visits (Telemedicine).  Patients are able to view lab/test results, encounter notes, upcoming appointments, etc.  Non-urgent messages can be sent to your provider as well.   To learn more about what you can do with MyChart, go to [email or web address].    Your next appointment:   1 month(s)  The format for your next appointment:   In Person  Provider:   You may see Dr. Daneen Schick or one of the following Advanced Practice Providers on your designated Care Team:    Truitt Merle, NP  Cecilie Kicks, NP  Kathyrn Drown, NP    Other Instructions      Signed, Sinclair Grooms, MD  11/10/2019 12:47 PM    [facility]

## 2019-11-10 ENCOUNTER — Other Ambulatory Visit: Payer: Self-pay

## 2019-11-10 ENCOUNTER — Ambulatory Visit (INDEPENDENT_AMBULATORY_CARE_PROVIDER_SITE_OTHER): Payer: Medicare Other | Admitting: Interventional Cardiology

## 2019-11-10 ENCOUNTER — Ambulatory Visit (INDEPENDENT_AMBULATORY_CARE_PROVIDER_SITE_OTHER): Payer: Medicare Other | Admitting: Nurse Practitioner

## 2019-11-10 ENCOUNTER — Encounter: Payer: Self-pay | Admitting: Interventional Cardiology

## 2019-11-10 VITALS — BP 142/64 | HR 87 | Ht 69.0 in | Wt 183.0 lb

## 2019-11-10 DIAGNOSIS — I509 Heart failure, unspecified: Secondary | ICD-10-CM | POA: Diagnosis not present

## 2019-11-10 DIAGNOSIS — U071 COVID-19: Secondary | ICD-10-CM | POA: Diagnosis not present

## 2019-11-10 DIAGNOSIS — I130 Hypertensive heart and chronic kidney disease with heart failure and stage 1 through stage 4 chronic kidney disease, or unspecified chronic kidney disease: Secondary | ICD-10-CM | POA: Diagnosis not present

## 2019-11-10 DIAGNOSIS — Z7901 Long term (current) use of anticoagulants: Secondary | ICD-10-CM

## 2019-11-10 DIAGNOSIS — I25119 Atherosclerotic heart disease of native coronary artery with unspecified angina pectoris: Secondary | ICD-10-CM

## 2019-11-10 DIAGNOSIS — R0602 Shortness of breath: Secondary | ICD-10-CM

## 2019-11-10 DIAGNOSIS — Z950 Presence of cardiac pacemaker: Secondary | ICD-10-CM

## 2019-11-10 DIAGNOSIS — I4821 Permanent atrial fibrillation: Secondary | ICD-10-CM | POA: Diagnosis not present

## 2019-11-10 DIAGNOSIS — I495 Sick sinus syndrome: Secondary | ICD-10-CM

## 2019-11-10 DIAGNOSIS — I4891 Unspecified atrial fibrillation: Secondary | ICD-10-CM

## 2019-11-10 DIAGNOSIS — D6869 Other thrombophilia: Secondary | ICD-10-CM

## 2019-11-10 DIAGNOSIS — I6523 Occlusion and stenosis of bilateral carotid arteries: Secondary | ICD-10-CM | POA: Diagnosis not present

## 2019-11-10 DIAGNOSIS — R001 Bradycardia, unspecified: Secondary | ICD-10-CM

## 2019-11-10 NOTE — Patient Instructions (Signed)
Medication Instructions:  Your physician recommends that you continue on your current medications as directed. Please refer to the Current Medication list given to you today.  *If you need a refill on your cardiac medications before your next appointment, please call your pharmacy*   Lab Work: CBC, Pro BNP, BMET, Liver today  If you have labs (blood work) drawn today and your tests are completely normal, you will receive your results only by: [name] MyChart Message (if you have MyChart) OR . A paper copy in the mail If you have any lab test that is abnormal or we need to change your treatment, we will call you to review the results.   Testing/Procedures: Your physician has requested that you have an echocardiogram. Echocardiography is a painless test that uses sound waves to create images of your heart. It provides your doctor with information about the size and shape of your heart and how well your heart's chambers and valves are working. This procedure takes approximately one hour. There are no restrictions for this procedure.   Follow-Up: At [facility], you and your health needs are our priority.  As part of our continuing mission to provide you with exceptional heart care, we have created designated Provider Care Teams.  These Care Teams include your primary Cardiologist (physician) and Advanced Practice Providers (APPs -  Physician Assistants and Nurse Practitioners) who all work together to provide you with the care you need, when you need it.  We recommend signing up for the patient portal called "MyChart".  Sign up information is provided on this After Visit Summary.  MyChart is used to connect with patients for Virtual Visits (Telemedicine).  Patients are able to view lab/test results, encounter notes, upcoming appointments, etc.  Non-urgent messages can be sent to your provider as well.   To learn more about what you can do with MyChart, go to [email or web address].    Your next  appointment:   1 month(s)  The format for your next appointment:   In Person  Provider:   You may see Dr. Daneen Schick or one of the following Advanced Practice Providers on your designated Care Team:    Truitt Merle, NP  Cecilie Kicks, NP  Kathyrn Drown, NP    Other Instructions

## 2019-11-11 LAB — HEPATIC FUNCTION PANEL
ALT: 14 IU/L (ref 0–44)
AST: 24 IU/L (ref 0–40)
Albumin: 4.3 g/dL (ref 3.6–4.6)
Alkaline Phosphatase: 116 IU/L (ref 39–117)
Bilirubin Total: 0.6 mg/dL (ref 0.0–1.2)
Bilirubin, Direct: 0.2 mg/dL (ref 0.00–0.40)
Total Protein: 6.5 g/dL (ref 6.0–8.5)

## 2019-11-11 LAB — CBC
Hematocrit: 39.1 % (ref 37.5–51.0)
Hemoglobin: 13.3 g/dL (ref 13.0–17.7)
MCH: 33.6 pg — ABNORMAL HIGH (ref 26.6–33.0)
MCHC: 34 g/dL (ref 31.5–35.7)
MCV: 99 fL — ABNORMAL HIGH (ref 79–97)
Platelets: 148 10*3/uL — ABNORMAL LOW (ref 150–450)
RBC: 3.96 x10E6/uL — ABNORMAL LOW (ref 4.14–5.80)
RDW: 13 % (ref 11.6–15.4)
WBC: 6.5 10*3/uL (ref 3.4–10.8)

## 2019-11-11 LAB — PRO B NATRIURETIC PEPTIDE: NT-Pro BNP: 2698 pg/mL — ABNORMAL HIGH (ref 0–486)

## 2019-11-13 LAB — BASIC METABOLIC PANEL
BUN/Creatinine Ratio: 15 (ref 10–24)
BUN: 21 mg/dL (ref 8–27)
CO2: 27 mmol/L (ref 20–29)
Calcium: 9.2 mg/dL (ref 8.6–10.2)
Chloride: 100 mmol/L (ref 96–106)
Creatinine, Ser: 1.4 mg/dL — ABNORMAL HIGH (ref 0.76–1.27)
GFR calc Af Amer: 53 mL/min/{1.73_m2} — ABNORMAL LOW (ref >59)
GFR calc non Af Amer: 45 mL/min/{1.73_m2} — ABNORMAL LOW (ref >59)
Glucose: 137 mg/dL — ABNORMAL HIGH (ref 65–99)
Potassium: 3.5 mmol/L (ref 3.5–5.2)
Sodium: 142 mmol/L (ref 134–144)

## 2019-11-13 LAB — SPECIMEN STATUS REPORT

## 2019-11-15 ENCOUNTER — Telehealth: Payer: Self-pay | Admitting: *Deleted

## 2019-11-15 DIAGNOSIS — I252 Old myocardial infarction: Secondary | ICD-10-CM | POA: Diagnosis not present

## 2019-11-15 DIAGNOSIS — M179 Osteoarthritis of knee, unspecified: Secondary | ICD-10-CM | POA: Diagnosis not present

## 2019-11-15 DIAGNOSIS — M159 Polyosteoarthritis, unspecified: Secondary | ICD-10-CM | POA: Diagnosis not present

## 2019-11-15 DIAGNOSIS — I10 Essential (primary) hypertension: Secondary | ICD-10-CM | POA: Diagnosis not present

## 2019-11-15 DIAGNOSIS — I480 Paroxysmal atrial fibrillation: Secondary | ICD-10-CM | POA: Diagnosis not present

## 2019-11-15 DIAGNOSIS — N400 Enlarged prostate without lower urinary tract symptoms: Secondary | ICD-10-CM | POA: Diagnosis not present

## 2019-11-15 DIAGNOSIS — C61 Malignant neoplasm of prostate: Secondary | ICD-10-CM | POA: Diagnosis not present

## 2019-11-15 DIAGNOSIS — N183 Chronic kidney disease, stage 3 unspecified: Secondary | ICD-10-CM | POA: Diagnosis not present

## 2019-11-15 DIAGNOSIS — I5030 Unspecified diastolic (congestive) heart failure: Secondary | ICD-10-CM | POA: Diagnosis not present

## 2019-11-15 DIAGNOSIS — E785 Hyperlipidemia, unspecified: Secondary | ICD-10-CM | POA: Diagnosis not present

## 2019-11-15 DIAGNOSIS — I509 Heart failure, unspecified: Secondary | ICD-10-CM

## 2019-11-15 DIAGNOSIS — H409 Unspecified glaucoma: Secondary | ICD-10-CM | POA: Diagnosis not present

## 2019-11-15 DIAGNOSIS — I2510 Atherosclerotic heart disease of native coronary artery without angina pectoris: Secondary | ICD-10-CM | POA: Diagnosis not present

## 2019-11-15 MED ORDER — SPIRONOLACTONE 25 MG PO TABS: 12.5000 mg | ORAL_TABLET | Freq: Every day | ORAL | 3 refills | Status: DC

## 2019-11-15 NOTE — Telephone Encounter (Signed)
Spoke with pt and wife and reviewed results.  Clarified with Dr. [name] and he wants to start Spironolactone 12.5mg  QD.  Scheduled labs for same day as echo, 4/2.  Pt verbalized understanding and was in agreement with plan.

## 2019-11-15 NOTE — Telephone Encounter (Signed)
-----   Message from Belva Crome, MD sent at 11/11/2019  1:11 PM EDT ----- Let the patient know he still has fluid. Awaiting on Echo and BMET to decide about adjustment in diuretic. A copy will be sent to Josetta Huddle, MD

## 2019-11-24 ENCOUNTER — Other Ambulatory Visit: Payer: Medicare Other | Admitting: *Deleted

## 2019-11-24 ENCOUNTER — Other Ambulatory Visit: Payer: Self-pay

## 2019-11-24 ENCOUNTER — Ambulatory Visit (HOSPITAL_COMMUNITY): Payer: Medicare Other | Attending: Internal Medicine

## 2019-11-24 DIAGNOSIS — R0602 Shortness of breath: Secondary | ICD-10-CM | POA: Diagnosis not present

## 2019-11-24 DIAGNOSIS — I509 Heart failure, unspecified: Secondary | ICD-10-CM

## 2019-11-25 LAB — BASIC METABOLIC PANEL
BUN/Creatinine Ratio: 16 (ref 10–24)
BUN: 20 mg/dL (ref 8–27)
CO2: 27 mmol/L (ref 20–29)
Calcium: 9.4 mg/dL (ref 8.6–10.2)
Chloride: 98 mmol/L (ref 96–106)
Creatinine, Ser: 1.23 mg/dL (ref 0.76–1.27)
GFR calc Af Amer: 61 mL/min/{1.73_m2} (ref >59)
GFR calc non Af Amer: 53 mL/min/{1.73_m2} — ABNORMAL LOW (ref >59)
Glucose: 217 mg/dL — ABNORMAL HIGH (ref 65–99)
Potassium: 3.7 mmol/L (ref 3.5–5.2)
Sodium: 142 mmol/L (ref 134–144)

## 2019-11-27 ENCOUNTER — Other Ambulatory Visit: Payer: Self-pay | Admitting: *Deleted

## 2019-11-27 DIAGNOSIS — I25119 Atherosclerotic heart disease of native coronary artery with unspecified angina pectoris: Secondary | ICD-10-CM

## 2019-11-27 DIAGNOSIS — I509 Heart failure, unspecified: Secondary | ICD-10-CM

## 2019-11-29 ENCOUNTER — Other Ambulatory Visit: Payer: Self-pay | Admitting: *Deleted

## 2019-11-29 MED ORDER — SPIRONOLACTONE 25 MG PO TABS: 25.0000 mg | ORAL_TABLET | Freq: Every day | ORAL | 3 refills | Status: DC

## 2019-12-07 NOTE — Progress Notes (Signed)
Cardiology Office Note:    Date:  12/08/2019   ID:  Jonathan Warner, DOB 08/02/1932, MRN [id number]  PCP:  Josetta Huddle, MD  Cardiologist:  No primary care provider on file.   Referring MD: Josetta Huddle, MD   Chief Complaint  Patient presents with  . Congestive Heart Failure  . Coronary Artery Disease  . Atrial Fibrillation    History of Present Illness:    Jonathan Warner is a 86 y.o. 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  He feels better.  Breathing is improved.  He has not lost much weight.  Diuretic intensity was increased after BNP in the setting of shortness of breath came back elevated greater than 2500.  Weight is down 3 pounds.  Orthopnea has resolved.  Dyspnea on exertion is better.  Still has exertional intolerance but states that he is not able to do much due to bilateral severe knee arthritis.  Vague fleeting chest discomfort.  Perhaps more common with physical activity.  Goes away with rest..  Past Medical History:  Diagnosis Date  . Arthritis   . BPH (benign prostatic hyperplasia)   . CAD (coronary artery disease)    s/p CABG 1991  . Carotid artery occlusion   . Cataract    b/l surgery  . Chronic kidney disease (CKD), stage III (moderate)   . Diverticulosis   . DJD (degenerative joint disease)   . DJD (degenerative joint disease)   . GERD (gastroesophageal reflux disease)   . Glaucoma   . Hiatal hernia   . History of adenomatous polyp of colon 06/07/13   Last colonoscopy 06/27/13 showed small adenoma, no further testing due to advanced age.  [name] History of radiation therapy 07/25/2012-09/20/2012   prostate 7800 cGy 40 sessions, seminal vesicles 5600 cGy 40 sessions  . History of shingles   . Hypercholesteremia   . Hyperlipidemia   . Hypertension   . Inguinal hernia   . Osteomyelitis of forearm,  right, acute ([place])    drainage x 2  . Persistent atrial fibrillation ([place])   . Prostate cancer ([place]) 04/27/12   Dr. Eulogio Ditch. Radiation therapy. Adenocarcinoma,gleason=3+4=7, 3+3=6,PSA=4.93  volume=51cc  . Pulmonary nodules    small rlung base nodule 4.62mm, scarring in lung bases  . 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 CARDIAC CATHETERIZATION  06/04/09   left w/noted patent bypass grafts  . CATARACT EXTRACTION W/ INTRAOCULAR LENS IMPLANT  2011  . COLONOSCOPY  12/21/2007   hx polyps, diverticulosis  . COLONOSCOPY WITH PROPOFOL N/A 06/27/2013   Procedure: COLONOSCOPY WITH PROPOFOL;  Surgeon: Garlan Fair, MD;  Location: [facility] ENDOSCOPY;  Service: Endoscopy;  Laterality: N/A;  . CORONARY ARTERY BYPASS GRAFT  1991   x5; prior MI  . PACEMAKER GENERATOR CHANGE  05/24/12  . PACEMAKER INSERTION  05/14/03   MDT EnPulse implanted by Dr Leonia Reeves ddd  . PERMANENT PACEMAKER GENERATOR CHANGE N/A 05/24/2012   Procedure: PERMANENT PACEMAKER GENERATOR CHANGE;  Surgeon: Thompson Grayer, MD;  Location: [facility] CATH LAB;  Service: Cardiovascular;  Laterality: N/A;  . PROSTATE BIOPSY  04/27/12   Adenocarcinoma,gleason=3+3=6, 3+4,& 4+3,PSA=4.93,volume=51cc  . TUMOR REMOVAL     skin tumors removed    Current Medications: Current Meds  Medication Sig  .  acetaminophen (TYLENOL) 500 MG tablet Take 1,000 mg by mouth every 6 (six) hours as needed for pain.   [name] amLODipine (NORVASC) 5 MG tablet Take 1 tablet (5 mg total) by mouth daily. Please make yearly appt with Dr. [name] for May for future refills. 1st attempt  . apixaban (ELIQUIS) 5 MG TABS tablet Take 5 mg by mouth 2 (two) times daily.  . brimonidine (ALPHAGAN) 0.2 % ophthalmic solution Place 1 drop into both eyes 3 times daily.  . Cholecalciferol (VITAMIN D3) 2000 units TABS Take 1 tablet by mouth daily.   . dorzolamide (TRUSOPT) 2 %  ophthalmic solution Place 1 drop into both eyes 3 times daily.  . furosemide (LASIX) 40 MG tablet Take 2 tablets every morning = 80 mg in the morning. Please make yearly appt with Dr. [name] for May for future refills. 1st attempt  . latanoprost (XALATAN) 0.005 % ophthalmic solution Apply 1 drop to eye at bedtime.   . Multiple Vitamin (MULTIVITAMIN) tablet Take 1 tablet by mouth daily.  . nitroGLYCERIN (NITROSTAT) 0.4 MG SL tablet PLACE 1 TABLET UNDER TONGUE EVERY FIVE MINUTES AS NEEDED FOR CHEST PAIN.  [name] omeprazole (PRILOSEC) 20 MG capsule Take 20 mg by mouth daily.  . potassium chloride SA (KLOR-CON) 20 MEQ tablet TAKE 1 AND 1/2 TABLETS BY MOUTH EVERY DAY. Please make yearly appt with Dr. [name] for May for future refills. 1st attempt  . spironolactone (ALDACTONE) 25 MG tablet Take 1 tablet (25 mg total) by mouth daily.  . vitamin E 400 UNIT capsule Take 400 Units by mouth daily.  . [DISCONTINUED] nitroGLYCERIN (NITROSTAT) 0.4 MG SL tablet PLACE 1 TABLET UNDER TONGUE EVERY FIVE MINUTES AS NEEDED FOR CHEST PAIN.     Allergies:   Pilocarpine and Rosuvastatin   Social History   Socioeconomic History  . Marital status: Married    Spouse name: Not on file  . Number of children: 1  . Years of education: Not on file  . Highest education level: Not on file  Occupational History  . Occupation: Retired    Comment: Theatre stage manager units  Tobacco Use  . Smoking status: Former Smoker    Packs/day: 0.50    Years: 10.00    Pack years: 5.00    Types: Cigarettes    Quit date: 08/24/1981    Years since quitting: 38.3  . Smokeless tobacco: Never Used  Substance and Sexual Activity  . Alcohol use: No  . Drug use: No  . Sexual activity: Not on file  Other Topics Concern  . Not on file  Social History Narrative   Retired Hotel manager.  Lives in [place].   Social Determinants of Health   Financial Resource Strain:   . Difficulty of Paying Living Expenses:   Food  Insecurity:   . Worried About Charity fundraiser in the Last Year:   . Arboriculturist in the Last Year:   Transportation Needs:   . Film/video editor (Medical):   [name] Lack of Transportation (Non-Medical):   Physical Activity:   . Days of Exercise per Week:   . Minutes of Exercise per Session:   Stress:   . Feeling of Stress :   Social Connections:   . Frequency of Communication with Friends and Family:   . Frequency of Social Gatherings with Friends and Family:   . Attends Religious Services:   . Active Member of Clubs or Organizations:   . Attends Archivist Meetings:   .  Marital Status:      Family History: The patient's family history includes Alzheimer's disease in his father; CAD in his brother and mother; CVA in his brother; Congestive Heart Failure in his mother; Heart attack in his brother; Hypertension in his mother and sister; Pancreatic cancer in his brother; Stroke in his brother.  ROS:   Please see the history of present illness.    No blood in the urine or stool.  Compliant with medication regimen.  Limited by bilateral knee pain.  All other systems reviewed and are negative.  EKGs/Labs/Other Studies Reviewed:    The following studies were reviewed today:  No new functional data other than the Echo 11/24/2019 that was done after the last visit: IMPRESSIONS  1. Left ventricular ejection fraction, by estimation, is 50 to 55%. The  left ventricle has low normal function. The left ventricle has no regional  wall motion abnormalities. There is severe asymmetric left ventricular  hypertrophy of the basal-septal  segment. Left ventricular diastolic parameters are indeterminate.  2. Right ventricular systolic function is normal. The right ventricular  size is normal.  3. Left atrial size was moderately dilated.  4. The mitral valve is abnormal. Mild mitral valve regurgitation. No  evidence of mitral stenosis.  5. The aortic valve is normal in  structure. Aortic valve regurgitation is  trivial. No aortic stenosis is present.   EKG:  EKG not repeated  Recent Labs: 11/10/2019: ALT 14; Hemoglobin 13.3; NT-Pro BNP 2,698; Platelets 148 11/24/2019: BUN 20; Creatinine, Ser 1.23; Potassium 3.7; Sodium 142  Recent Lipid Panel    Component Value Date/Time   CHOL 161 06/14/2013 0955   TRIG 250.0 (H) 06/14/2013 0955   HDL 34.70 (L) 06/14/2013 0955   CHOLHDL 5 06/14/2013 0955   VLDL 50.0 (H) 06/14/2013 0955   LDLDIRECT 83.5 06/14/2013 0955    Physical Exam:    VS:  BP (!) 142/68   Pulse 75   Ht 5\' 9"  (1.753 m)   Wt 180 lb (81.6 kg)   SpO2 98%   BMI 26.58 kg/m     Wt Readings from Last 3 Encounters:  12/08/19 180 lb (81.6 kg)  11/10/19 183 lb (83 kg)  10/11/19 183 lb (83 kg)     GEN: No distress and appears happy.. No acute distress HEENT: Normal NECK: No JVD. LYMPHATICS: No lymphadenopathy CARDIAC: Irregular RR without murmur, gallop, or edema. VASCULAR:  Normal Pulses. No bruits. RESPIRATORY:  Clear to auscultation without rales, wheezing or rhonchi  ABDOMEN: Soft, non-tender, non-distended, No pulsatile mass, MUSCULOSKELETAL: No deformity  SKIN: Warm and dry NEUROLOGIC:  Alert and oriented x 3 PSYCHIATRIC:  Normal affect   ASSESSMENT:    1. Acute on chronic diastolic HF (heart failure) ([place])   2. Coronary artery disease involving native coronary artery of native heart with angina pectoris ([place])   3. Atrial fibrillation, unspecified type ([place])   4. Hypertensive heart and chronic kidney disease with heart failure and stage 1 through stage 4 chronic kidney disease, or chronic kidney disease ([place])   5. Tachy-brady syndrome ([place])   6. Chronic anticoagulation   7. Educated about COVID-19 virus infection    PLAN:    In order of problems listed above:  1. Resolved.  Breathing improved.  Basic metabolic panel to determine if we are over diuresing.  Follow-up BNP to gauge improvement.  Continue Aldactone and furosemide  80 mg a.m. 2. Sublingual nitroglycerin tablets are prescribed.  Specific instructions given to determine frequency of episodes,  circumstances that precipitate discomfort, and response to sublingual nitroglycerin.  Call if episodes become increasingly frequent or prolonged. 3. Continue same therapy including Eliquis and not currently on rate control or antiarrhythmic therapy. 4. Blood pressure control is adequate for age.  Target is 140/68.  Continue Norvasc, and Aldactone.  Basic metabolic panel will help to assess kidney function. 5. Permanent pacemaker 6. Continue apixaban.  CBC will be obtained.   Medication Adjustments/Labs and Tests Ordered: Current medicines are reviewed at length with the patient today.  Concerns regarding medicines are outlined above.  Orders Placed This Encounter  Procedures  . Basic metabolic panel  . Pro b natriuretic peptide   Meds ordered this encounter  Medications  . nitroGLYCERIN (NITROSTAT) 0.4 MG SL tablet    Sig: PLACE 1 TABLET UNDER TONGUE EVERY FIVE MINUTES AS NEEDED FOR CHEST PAIN.    Dispense:  25 tablet    Refill:  3    Patient Instructions  Medication Instructions:  1) A prescription has been sent in for Nitroglycerin.  If you have chest pain that doesn't relieve quickly, place one tablet under your tongue and allow it to dissolve.  If no relief after 5 minutes, you may take another pill.  If no relief after 5 minutes, you may take a 3rd dose but you need to call 911 and report to ER immediately.  *If you need a refill on your cardiac medications before your next appointment, please call your pharmacy*   Lab Work: BMET and Pro BNP today  If you have labs (blood work) drawn today and your tests are completely normal, you will receive your results only by: [name] MyChart Message (if you have MyChart) OR . A paper copy in the mail If you have any lab test that is abnormal or we need to change your treatment, we will call you to review the  results.   Testing/Procedures: None   Follow-Up: At [facility], you and your health needs are our priority.  As part of our continuing mission to provide you with exceptional heart care, we have created designated Provider Care Teams.  These Care Teams include your primary Cardiologist (physician) and Advanced Practice Providers (APPs -  Physician Assistants and Nurse Practitioners) who all work together to provide you with the care you need, when you need it.  We recommend signing up for the patient portal called "MyChart".  Sign up information is provided on this After Visit Summary.  MyChart is used to connect with patients for Virtual Visits (Telemedicine).  Patients are able to view lab/test results, encounter notes, upcoming appointments, etc.  Non-urgent messages can be sent to your provider as well.   To learn more about what you can do with MyChart, go to [email or web address].    Your next appointment:   4-6 month(s)  The format for your next appointment:   In Person  Provider:   You may see Dr. Daneen Schick or one of the following Advanced Practice Providers on your designated Care Team:    Truitt Merle, NP  Cecilie Kicks, NP  Kathyrn Drown, NP    Other Instructions      Signed, Sinclair Grooms, MD  12/08/2019 12:41 PM    [place]

## 2019-12-08 ENCOUNTER — Encounter: Payer: Self-pay | Admitting: Interventional Cardiology

## 2019-12-08 ENCOUNTER — Other Ambulatory Visit: Payer: Self-pay

## 2019-12-08 ENCOUNTER — Ambulatory Visit (INDEPENDENT_AMBULATORY_CARE_PROVIDER_SITE_OTHER): Payer: Medicare Other | Admitting: Interventional Cardiology

## 2019-12-08 VITALS — BP 142/68 | HR 75 | Ht 69.0 in | Wt 180.0 lb

## 2019-12-08 DIAGNOSIS — I495 Sick sinus syndrome: Secondary | ICD-10-CM | POA: Diagnosis not present

## 2019-12-08 DIAGNOSIS — Z7189 Other specified counseling: Secondary | ICD-10-CM

## 2019-12-08 DIAGNOSIS — Z7901 Long term (current) use of anticoagulants: Secondary | ICD-10-CM | POA: Diagnosis not present

## 2019-12-08 DIAGNOSIS — I4891 Unspecified atrial fibrillation: Secondary | ICD-10-CM

## 2019-12-08 DIAGNOSIS — I130 Hypertensive heart and chronic kidney disease with heart failure and stage 1 through stage 4 chronic kidney disease, or unspecified chronic kidney disease: Secondary | ICD-10-CM | POA: Diagnosis not present

## 2019-12-08 DIAGNOSIS — I5033 Acute on chronic diastolic (congestive) heart failure: Secondary | ICD-10-CM | POA: Diagnosis not present

## 2019-12-08 DIAGNOSIS — I6523 Occlusion and stenosis of bilateral carotid arteries: Secondary | ICD-10-CM | POA: Diagnosis not present

## 2019-12-08 DIAGNOSIS — I25119 Atherosclerotic heart disease of native coronary artery with unspecified angina pectoris: Secondary | ICD-10-CM | POA: Diagnosis not present

## 2019-12-08 MED ORDER — NITROGLYCERIN 0.4 MG SL SUBL: SUBLINGUAL_TABLET | SUBLINGUAL | 3 refills | Status: AC

## 2019-12-08 NOTE — Patient Instructions (Signed)
Medication Instructions:  1) A prescription has been sent in for Nitroglycerin.  If you have chest pain that doesn't relieve quickly, place one tablet under your tongue and allow it to dissolve.  If no relief after 5 minutes, you may take another pill.  If no relief after 5 minutes, you may take a 3rd dose but you need to call 911 and report to ER immediately.  *If you need a refill on your cardiac medications before your next appointment, please call your pharmacy*   Lab Work: BMET and Pro BNP today  If you have labs (blood work) drawn today and your tests are completely normal, you will receive your results only by: [name] MyChart Message (if you have MyChart) OR . A paper copy in the mail If you have any lab test that is abnormal or we need to change your treatment, we will call you to review the results.   Testing/Procedures: None   Follow-Up: At [facility], you and your health needs are our priority.  As part of our continuing mission to provide you with exceptional heart care, we have created designated Provider Care Teams.  These Care Teams include your primary Cardiologist (physician) and Advanced Practice Providers (APPs -  Physician Assistants and Nurse Practitioners) who all work together to provide you with the care you need, when you need it.  We recommend signing up for the patient portal called "MyChart".  Sign up information is provided on this After Visit Summary.  MyChart is used to connect with patients for Virtual Visits (Telemedicine).  Patients are able to view lab/test results, encounter notes, upcoming appointments, etc.  Non-urgent messages can be sent to your provider as well.   To learn more about what you can do with MyChart, go to [email or web address].    Your next appointment:   4-6 month(s)  The format for your next appointment:   In Person  Provider:   You may see Dr. Daneen Schick or one of the following Advanced Practice Providers on your designated  Care Team:    Truitt Merle, NP  Cecilie Kicks, NP  Kathyrn Drown, NP    Other Instructions

## 2019-12-09 LAB — PRO B NATRIURETIC PEPTIDE: NT-Pro BNP: 2453 pg/mL — ABNORMAL HIGH (ref 0–486)

## 2019-12-09 LAB — BASIC METABOLIC PANEL
BUN/Creatinine Ratio: 17 (ref 10–24)
BUN: 24 mg/dL (ref 8–27)
CO2: 27 mmol/L (ref 20–29)
Calcium: 9.7 mg/dL (ref 8.6–10.2)
Chloride: 100 mmol/L (ref 96–106)
Creatinine, Ser: 1.41 mg/dL — ABNORMAL HIGH (ref 0.76–1.27)
GFR calc Af Amer: 52 mL/min/{1.73_m2} — ABNORMAL LOW (ref >59)
GFR calc non Af Amer: 45 mL/min/{1.73_m2} — ABNORMAL LOW (ref >59)
Glucose: 96 mg/dL (ref 65–99)
Potassium: 4.5 mmol/L (ref 3.5–5.2)
Sodium: 143 mmol/L (ref 134–144)

## 2019-12-20 DIAGNOSIS — M1711 Unilateral primary osteoarthritis, right knee: Secondary | ICD-10-CM | POA: Diagnosis not present

## 2019-12-20 DIAGNOSIS — I2510 Atherosclerotic heart disease of native coronary artery without angina pectoris: Secondary | ICD-10-CM | POA: Diagnosis not present

## 2019-12-20 DIAGNOSIS — E785 Hyperlipidemia, unspecified: Secondary | ICD-10-CM | POA: Diagnosis not present

## 2019-12-20 DIAGNOSIS — I480 Paroxysmal atrial fibrillation: Secondary | ICD-10-CM | POA: Diagnosis not present

## 2019-12-20 DIAGNOSIS — K1120 Sialoadenitis, unspecified: Secondary | ICD-10-CM | POA: Diagnosis not present

## 2019-12-20 DIAGNOSIS — I5033 Acute on chronic diastolic (congestive) heart failure: Secondary | ICD-10-CM | POA: Diagnosis not present

## 2019-12-21 DIAGNOSIS — I2510 Atherosclerotic heart disease of native coronary artery without angina pectoris: Secondary | ICD-10-CM | POA: Diagnosis not present

## 2019-12-21 DIAGNOSIS — N400 Enlarged prostate without lower urinary tract symptoms: Secondary | ICD-10-CM | POA: Diagnosis not present

## 2019-12-21 DIAGNOSIS — M179 Osteoarthritis of knee, unspecified: Secondary | ICD-10-CM | POA: Diagnosis not present

## 2019-12-21 DIAGNOSIS — I480 Paroxysmal atrial fibrillation: Secondary | ICD-10-CM | POA: Diagnosis not present

## 2019-12-21 DIAGNOSIS — I5030 Unspecified diastolic (congestive) heart failure: Secondary | ICD-10-CM | POA: Diagnosis not present

## 2019-12-21 DIAGNOSIS — E785 Hyperlipidemia, unspecified: Secondary | ICD-10-CM | POA: Diagnosis not present

## 2019-12-21 DIAGNOSIS — H409 Unspecified glaucoma: Secondary | ICD-10-CM | POA: Diagnosis not present

## 2019-12-21 DIAGNOSIS — N183 Chronic kidney disease, stage 3 unspecified: Secondary | ICD-10-CM | POA: Diagnosis not present

## 2019-12-21 DIAGNOSIS — I5033 Acute on chronic diastolic (congestive) heart failure: Secondary | ICD-10-CM | POA: Diagnosis not present

## 2019-12-21 DIAGNOSIS — I252 Old myocardial infarction: Secondary | ICD-10-CM | POA: Diagnosis not present

## 2019-12-21 DIAGNOSIS — C61 Malignant neoplasm of prostate: Secondary | ICD-10-CM | POA: Diagnosis not present

## 2019-12-21 DIAGNOSIS — I10 Essential (primary) hypertension: Secondary | ICD-10-CM | POA: Diagnosis not present

## 2019-12-26 MED ORDER — SPIRONOLACTONE 25 MG PO TABS: 12.5000 mg | ORAL_TABLET | Freq: Every day | ORAL | 3 refills | Status: DC

## 2020-01-25 ENCOUNTER — Ambulatory Visit (INDEPENDENT_AMBULATORY_CARE_PROVIDER_SITE_OTHER): Payer: Medicare Other | Admitting: *Deleted

## 2020-01-25 DIAGNOSIS — I495 Sick sinus syndrome: Secondary | ICD-10-CM | POA: Diagnosis not present

## 2020-01-26 ENCOUNTER — Telehealth: Payer: Self-pay

## 2020-01-26 DIAGNOSIS — R5383 Other fatigue: Secondary | ICD-10-CM

## 2020-01-26 LAB — CUP PACEART REMOTE DEVICE CHECK
Battery Impedance: 866 Ohm
Battery Remaining Longevity: 81 mo
Battery Voltage: 2.78 V
Brady Statistic RV Percent Paced: 29 %
Date Time Interrogation Session: 20210604090917
Implantable Lead Implant Date: 20040920
Implantable Lead Implant Date: 20040920
Implantable Lead Location: 753859
Implantable Lead Location: 753860
Implantable Lead Model: 5076
Implantable Lead Model: 5092
Implantable Pulse Generator Implant Date: 20131001
Lead Channel Impedance Value: 67 Ohm
Lead Channel Impedance Value: 696 Ohm
Lead Channel Pacing Threshold Amplitude: 0.75 V
Lead Channel Pacing Threshold Pulse Width: 0.4 ms
Lead Channel Setting Pacing Amplitude: 2.5 V
Lead Channel Setting Pacing Pulse Width: 0.4 ms
Lead Channel Setting Sensing Sensitivity: 4 mV

## 2020-01-26 NOTE — Telephone Encounter (Signed)
Normal device function, no episodes  Chanetta Marshall, NP 01/26/2020 11:21 AM

## 2020-01-26 NOTE — Telephone Encounter (Signed)
The pt states he missed his home remote appointment and wanted to know if he can do it today? I told him he can do it any time today.

## 2020-01-30 ENCOUNTER — Other Ambulatory Visit: Payer: Medicare Other

## 2020-01-30 ENCOUNTER — Other Ambulatory Visit: Payer: Self-pay

## 2020-01-30 DIAGNOSIS — R5383 Other fatigue: Secondary | ICD-10-CM

## 2020-01-30 DIAGNOSIS — I25119 Atherosclerotic heart disease of native coronary artery with unspecified angina pectoris: Secondary | ICD-10-CM

## 2020-01-30 LAB — BASIC METABOLIC PANEL
BUN/Creatinine Ratio: 15 (ref 10–24)
BUN: 22 mg/dL (ref 8–27)
CO2: 27 mmol/L (ref 20–29)
Calcium: 9.8 mg/dL (ref 8.6–10.2)
Chloride: 99 mmol/L (ref 96–106)
Creatinine, Ser: 1.42 mg/dL — ABNORMAL HIGH (ref 0.76–1.27)
GFR calc Af Amer: 51 mL/min/{1.73_m2} — ABNORMAL LOW (ref >59)
GFR calc non Af Amer: 44 mL/min/{1.73_m2} — ABNORMAL LOW (ref >59)
Glucose: 128 mg/dL — ABNORMAL HIGH (ref 65–99)
Potassium: 3.5 mmol/L (ref 3.5–5.2)
Sodium: 141 mmol/L (ref 134–144)

## 2020-01-30 NOTE — Progress Notes (Signed)
Remote pacemaker transmission.   

## 2020-01-31 DIAGNOSIS — I2510 Atherosclerotic heart disease of native coronary artery without angina pectoris: Secondary | ICD-10-CM | POA: Diagnosis not present

## 2020-01-31 DIAGNOSIS — I5030 Unspecified diastolic (congestive) heart failure: Secondary | ICD-10-CM | POA: Diagnosis not present

## 2020-01-31 LAB — CBC
Hematocrit: 41.3 % (ref 37.5–51.0)
Hemoglobin: 13.7 g/dL (ref 13.0–17.7)
MCH: 33.3 pg — ABNORMAL HIGH (ref 26.6–33.0)
MCHC: 33.2 g/dL (ref 31.5–35.7)
MCV: 101 fL — ABNORMAL HIGH (ref 79–97)
Platelets: 176 10*3/uL (ref 150–450)
RBC: 4.11 x10E6/uL — ABNORMAL LOW (ref 4.14–5.80)
RDW: 13 % (ref 11.6–15.4)
WBC: 5.9 10*3/uL (ref 3.4–10.8)

## 2020-01-31 LAB — HEPATIC FUNCTION PANEL
ALT: 14 IU/L (ref 0–44)
AST: 23 IU/L (ref 0–40)
Albumin: 4.5 g/dL (ref 3.6–4.6)
Alkaline Phosphatase: 126 IU/L — ABNORMAL HIGH (ref 48–121)
Bilirubin Total: 0.6 mg/dL (ref 0.0–1.2)
Bilirubin, Direct: 0.24 mg/dL (ref 0.00–0.40)
Total Protein: 6.9 g/dL (ref 6.0–8.5)

## 2020-01-31 LAB — PRO B NATRIURETIC PEPTIDE: NT-Pro BNP: 2125 pg/mL — ABNORMAL HIGH (ref 0–486)

## 2020-01-31 LAB — AMMONIA: Ammonia: 22 ug/dL — ABNORMAL LOW (ref 28–135)

## 2020-02-01 ENCOUNTER — Other Ambulatory Visit: Payer: Self-pay | Admitting: *Deleted

## 2020-02-01 DIAGNOSIS — I5033 Acute on chronic diastolic (congestive) heart failure: Secondary | ICD-10-CM

## 2020-02-01 MED ORDER — SPIRONOLACTONE 25 MG PO TABS
25.0000 mg | ORAL_TABLET | Freq: Every day | ORAL | 3 refills | Status: DC
Start: 2020-02-01 — End: 2020-04-12

## 2020-03-03 ENCOUNTER — Other Ambulatory Visit: Payer: Self-pay | Admitting: Interventional Cardiology

## 2020-03-11 DIAGNOSIS — H401113 Primary open-angle glaucoma, right eye, severe stage: Secondary | ICD-10-CM | POA: Diagnosis not present

## 2020-03-11 DIAGNOSIS — H401123 Primary open-angle glaucoma, left eye, severe stage: Secondary | ICD-10-CM | POA: Diagnosis not present

## 2020-03-20 DIAGNOSIS — I10 Essential (primary) hypertension: Secondary | ICD-10-CM | POA: Diagnosis not present

## 2020-03-20 DIAGNOSIS — M159 Polyosteoarthritis, unspecified: Secondary | ICD-10-CM | POA: Diagnosis not present

## 2020-03-20 DIAGNOSIS — C61 Malignant neoplasm of prostate: Secondary | ICD-10-CM | POA: Diagnosis not present

## 2020-03-20 DIAGNOSIS — M179 Osteoarthritis of knee, unspecified: Secondary | ICD-10-CM | POA: Diagnosis not present

## 2020-03-20 DIAGNOSIS — N183 Chronic kidney disease, stage 3 unspecified: Secondary | ICD-10-CM | POA: Diagnosis not present

## 2020-03-20 DIAGNOSIS — H409 Unspecified glaucoma: Secondary | ICD-10-CM | POA: Diagnosis not present

## 2020-03-20 DIAGNOSIS — I480 Paroxysmal atrial fibrillation: Secondary | ICD-10-CM | POA: Diagnosis not present

## 2020-03-20 DIAGNOSIS — M1711 Unilateral primary osteoarthritis, right knee: Secondary | ICD-10-CM | POA: Diagnosis not present

## 2020-03-20 DIAGNOSIS — I5033 Acute on chronic diastolic (congestive) heart failure: Secondary | ICD-10-CM | POA: Diagnosis not present

## 2020-03-20 DIAGNOSIS — I2510 Atherosclerotic heart disease of native coronary artery without angina pectoris: Secondary | ICD-10-CM | POA: Diagnosis not present

## 2020-03-20 DIAGNOSIS — I252 Old myocardial infarction: Secondary | ICD-10-CM | POA: Diagnosis not present

## 2020-03-20 DIAGNOSIS — E785 Hyperlipidemia, unspecified: Secondary | ICD-10-CM | POA: Diagnosis not present

## 2020-03-27 DIAGNOSIS — I8312 Varicose veins of left lower extremity with inflammation: Secondary | ICD-10-CM | POA: Diagnosis not present

## 2020-03-27 DIAGNOSIS — C44329 Squamous cell carcinoma of skin of other parts of face: Secondary | ICD-10-CM | POA: Diagnosis not present

## 2020-03-27 DIAGNOSIS — I8311 Varicose veins of right lower extremity with inflammation: Secondary | ICD-10-CM | POA: Diagnosis not present

## 2020-03-27 DIAGNOSIS — L308 Other specified dermatitis: Secondary | ICD-10-CM | POA: Diagnosis not present

## 2020-03-27 DIAGNOSIS — Z85828 Personal history of other malignant neoplasm of skin: Secondary | ICD-10-CM | POA: Diagnosis not present

## 2020-03-27 DIAGNOSIS — I872 Venous insufficiency (chronic) (peripheral): Secondary | ICD-10-CM | POA: Diagnosis not present

## 2020-03-27 DIAGNOSIS — D485 Neoplasm of uncertain behavior of skin: Secondary | ICD-10-CM | POA: Diagnosis not present

## 2020-03-28 NOTE — Telephone Encounter (Signed)
I called patient and made him aware Dr [name] is not in the office this week but I would send message to him  Patient reports he is having a great deal of pain in his knees.  He is considering knee replacement surgery and is asking if Dr [name] felt it would be OK from a heart standpoint to have surgery.

## 2020-04-01 ENCOUNTER — Telehealth: Payer: Self-pay | Admitting: Orthopedic Surgery

## 2020-04-01 NOTE — Telephone Encounter (Signed)
Patient's wife called asked if patient can be referred to Dr Ninfa Linden for his left knee. Lelan Pons said patient can not get out of a chair. The number to contact Lelan Pons is [phone]  Or [phone]

## 2020-04-01 NOTE — Telephone Encounter (Signed)
I called pt and he wanted an appt with Dr. Ninfa Linden as he did his wife's surgery and this appt was made for 04/10/20

## 2020-04-10 ENCOUNTER — Ambulatory Visit: Payer: Medicare Other | Admitting: Orthopaedic Surgery

## 2020-04-11 NOTE — Progress Notes (Signed)
Cardiology Office Note:    Date:  04/12/2020   ID:  Jonathan Warner, DOB [birth year], MRN [id number]  PCP:  Josetta Huddle, MD  Cardiologist:  Sinclair Grooms, MD   Referring MD: Josetta Huddle, MD   Chief Complaint  Patient presents with  . Coronary Artery Disease  . Congestive Heart Failure  . Claudication  . Hyperlipidemia    History of Present Illness:    Jonathan Warner is a 86 y.o. male with a hx of coronary artery disease with bypass surgery 1991, atrial fibrillation, tachycardia-bradycardia syndrome with Medtronic DDD pacemaker, diastolic heart failure hyperlipidemia, and chronic limiting osteoarthritis.  Bethena Roys is considering having orthopedic procedures done to help improve mobility, reduce pain, and improve quality of life.  Jonathan Warner has bilateral knee and upper leg issues.  Jonathan Warner will be seeing Dr. Jean Rosenthal.  Jonathan Warner and his wife do not even understand if any surgical approach will be offered.  They are concerned if his heart can tolerate having surgical procedures if recommended.  The patient had coronary bypass surgery 30 years ago.  Jonathan Warner has stable angina.  Jonathan Warner has diastolic heart failure.  Past Medical History:  Diagnosis Date  . Arthritis   . BPH (benign prostatic hyperplasia)   . CAD (coronary artery disease)    s/p CABG 1991  . Carotid artery occlusion   . Cataract    b/l surgery  . Chronic kidney disease (CKD), stage III (moderate)   . Diverticulosis   . DJD (degenerative joint disease)   . DJD (degenerative joint disease)   . GERD (gastroesophageal reflux disease)   . Glaucoma   . Hiatal hernia   . History of adenomatous polyp of colon 06/07/13   Last colonoscopy 06/27/13 showed small adenoma, no further testing due to advanced age.  [name] History of radiation therapy 07/25/2012-09/20/2012   prostate 7800 cGy 40 sessions, seminal vesicles 5600 cGy 40 sessions  . History of shingles   . Hypercholesteremia   . Hyperlipidemia   . Hypertension   . Inguinal hernia   .  Osteomyelitis of forearm, right, acute ([place])    drainage x 2  . Persistent atrial fibrillation ([place])   . Prostate cancer ([place]) 04/27/12   Dr. Eulogio Ditch. Radiation therapy. Adenocarcinoma,gleason=3+4=7, 3+3=6,PSA=4.93  volume=51cc  . Pulmonary nodules    small rlung base nodule 4.16mm, scarring in lung bases  . 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 CARDIAC CATHETERIZATION  06/04/09   left w/noted patent bypass grafts  . CATARACT EXTRACTION W/ INTRAOCULAR LENS IMPLANT  2011  . COLONOSCOPY  12/21/2007   hx polyps, diverticulosis  . COLONOSCOPY WITH PROPOFOL N/A 06/27/2013   Procedure: COLONOSCOPY WITH PROPOFOL;  Surgeon: Garlan Fair, MD;  Location: [facility];  Service: Endoscopy;  Laterality: N/A;  . CORONARY ARTERY BYPASS GRAFT  1991   x5; prior MI  . PACEMAKER GENERATOR CHANGE  05/24/12  . PACEMAKER INSERTION  05/14/03   MDT EnPulse implanted by Dr Leonia Reeves ddd  . PERMANENT PACEMAKER GENERATOR CHANGE N/A 05/24/2012   Procedure: PERMANENT PACEMAKER GENERATOR CHANGE;  Surgeon: Thompson Grayer, MD;  Location: [facility] CATH LAB;  Service: Cardiovascular;  Laterality: N/A;  . PROSTATE BIOPSY  04/27/12   Adenocarcinoma,gleason=3+3=6, 3+4,& 4+3,PSA=4.93,volume=51cc  . TUMOR REMOVAL     skin tumors removed    Current Medications: Current Meds  Medication  Sig  . acetaminophen (TYLENOL) 500 MG tablet Take 1,000 mg by mouth every 6 (six) hours as needed for pain.   [name] amLODipine (NORVASC) 5 MG tablet TAKE 1 TABLET BY MOUTH EVERY DAY  . apixaban (ELIQUIS) 5 MG TABS tablet Take 5 mg by mouth 2 (two) times daily.  . brimonidine (ALPHAGAN) 0.2 % ophthalmic solution Place 1 drop into both eyes 3 times daily.  . Cholecalciferol (VITAMIN D3) 2000 units TABS Take 1 tablet by mouth daily.   . dorzolamide (TRUSOPT) 2 % ophthalmic solution Place 1 drop into both eyes 3 times daily.    . furosemide (LASIX) 40 MG tablet TAKE 2 TABLETS BY MOUTH EVERY MORNING *NEED APPT*  . latanoprost (XALATAN) 0.005 % ophthalmic solution Apply 1 drop to eye at bedtime.   . Multiple Vitamin (MULTIVITAMIN) tablet Take 1 tablet by mouth daily.  . nitroGLYCERIN (NITROSTAT) 0.4 MG SL tablet PLACE 1 TABLET UNDER TONGUE EVERY FIVE MINUTES AS NEEDED FOR CHEST PAIN.  [name] omeprazole (PRILOSEC) 20 MG capsule Take 20 mg by mouth daily.  . potassium chloride SA (KLOR-CON M20) 20 MEQ tablet TAKE 1&1/2 TABLETS BY MOUTH EVERY DAY *NEED APPT*  . spironolactone (ALDACTONE) 25 MG tablet Take 12.5 mg by mouth daily.  . vitamin E 400 UNIT capsule Take 400 Units by mouth daily.     Allergies:   Pilocarpine and Rosuvastatin   Social History   Socioeconomic History  . Marital status: Married    Spouse name: Not on file  . Number of children: 1  . Years of education: Not on file  . Highest education level: Not on file  Occupational History  . Occupation: Retired    Comment: Theatre stage manager units  Tobacco Use  . Smoking status: Former Smoker    Packs/day: 0.50    Years: 10.00    Pack years: 5.00    Types: Cigarettes    Quit date: 08/24/1981    Years since quitting: 38.6  . Smokeless tobacco: Never Used  Vaping Use  . Vaping Use: Never used  Substance and Sexual Activity  . Alcohol use: No  . Drug use: No  . Sexual activity: Not on file  Other Topics Concern  . Not on file  Social History Narrative   Retired Hotel manager.  Lives in [place].   Social Determinants of Health   Financial Resource Strain:   . Difficulty of Paying Living Expenses: Not on file  Food Insecurity:   . Worried About Charity fundraiser in the Last Year: Not on file  . Ran Out of Food in the Last Year: Not on file  Transportation Needs:   . Lack of Transportation (Medical): Not on file  . Lack of Transportation (Non-Medical): Not on file  Physical Activity:   . Days of Exercise per Week: Not on  file  . Minutes of Exercise per Session: Not on file  Stress:   . Feeling of Stress : Not on file  Social Connections:   . Frequency of Communication with Friends and Family: Not on file  . Frequency of Social Gatherings with Friends and Family: Not on file  . Attends Religious Services: Not on file  . Active Member of Clubs or Organizations: Not on file  . Attends Archivist Meetings: Not on file  . Marital Status: Not on file     Family History: The patient's family history includes Alzheimer's disease in his father; CAD in his brother and mother; CVA in his  brother; Congestive Heart Failure in his mother; Heart attack in his brother; Hypertension in his mother and sister; Pancreatic cancer in his brother; Stroke in his brother.  ROS:   Please see the history of present illness.    Hard of hearing.  Not happy with his quality of life because of leg pain, weakness, and instability.  Jonathan Warner is worried about falling and hurting himself.  All other systems reviewed and are negative.  EKGs/Labs/Other Studies Reviewed:    The following studies were reviewed today:  2D Doppler echocardiogram 11/2019: IMPRESSIONS    1. Left ventricular ejection fraction, by estimation, is 50 to 55%. The  left ventricle has low normal function. The left ventricle has no regional  wall motion abnormalities. There is severe asymmetric left ventricular  hypertrophy of the basal-septal  segment. Left ventricular diastolic parameters are indeterminate.  2. Right ventricular systolic function is normal. The right ventricular  size is normal.  3. Left atrial size was moderately dilated.  4. The mitral valve is abnormal. Mild mitral valve regurgitation. No  evidence of mitral stenosis.  5. The aortic valve is normal in structure. Aortic valve regurgitation is  trivial. No aortic stenosis is present.   EKG:  EKG not repeated  Recent Labs: 01/30/2020: ALT 14; BUN 22; Creatinine, Ser 1.42;  Hemoglobin 13.7; NT-Pro BNP 2,125; Platelets 176; Potassium 3.5; Sodium 141  Recent Lipid Panel    Component Value Date/Time   CHOL 161 06/14/2013 0955   TRIG 250.0 (H) 06/14/2013 0955   HDL 34.70 (L) 06/14/2013 0955   CHOLHDL 5 06/14/2013 0955   VLDL 50.0 (H) 06/14/2013 0955   LDLDIRECT 83.5 06/14/2013 0955    Physical Exam:    VS:  BP (!) 116/58   Pulse 79   Ht 5\' 9"  (1.753 m)   Wt 184 lb 9.6 oz (83.7 kg)   SpO2 97%   BMI 27.26 kg/m     Wt Readings from Last 3 Encounters:  04/12/20 184 lb 9.6 oz (83.7 kg)  12/08/19 180 lb (81.6 kg)  11/10/19 183 lb (83 kg)     GEN: Elderly and frail-ish. No acute distress HEENT: Normal NECK: No JVD. LYMPHATICS: No lymphadenopathy CARDIAC: Irregularly irregular RR without murmur, gallop, or edema. VASCULAR:  Normal Pulses. No bruits. RESPIRATORY:  Clear to auscultation without rales, wheezing or rhonchi  ABDOMEN: Soft, non-tender, non-distended, No pulsatile mass, MUSCULOSKELETAL: No deformity  SKIN: Warm and dry NEUROLOGIC:  Alert and oriented x 3 PSYCHIATRIC:  Normal affect   ASSESSMENT:    1. Coronary artery disease involving native coronary artery of native heart with angina pectoris ([place])   2. Tachy-brady syndrome ([place])   3. Acute on chronic diastolic HF (heart failure) ([place])   4. Atrial fibrillation, unspecified type ([place])   5. Chronic anticoagulation   6. Educated about COVID-19 virus infection   7. Stage 3b chronic kidney disease    PLAN:    In order of problems listed above:  1. CAD with stable angina.  Jonathan Warner is 32 is status post CABG.  Prior to any surgical procedure or significant rehab Jonathan Warner would need to have a myocardial perfusion study done to exclude the possibility of high ischemic risk. 2. Rate and rhythm are controlled with current medical therapy. 3. Diastolic heart failure, compensated on low-dose Aldactone 12.5 mg/day. 4. Continue apixaban 5 mg twice daily.  Reasonable rate control currently without  medications to slow rate. 5. Continue to follow hemoglobin and creatinine to determine appropriate dose of apixaban.  6. Jonathan Warner has vaccinated, willing to receive a boost, and practicing social mitigation measures for COVID-19. 7. Most recent creatinine was 1.42 in June.  Function is stable.  After the orthopedic evaluation, if surgery is recommended, Jonathan Warner will need to have a nuclear stress test to exclude high risk subset.  I would not do the study until after Jonathan Warner is seen Dr. Ninfa Linden.  It is possible that surgery will not be a treatment option.  Plan 79-month follow-up   Medication Adjustments/Labs and Tests Ordered: Current medicines are reviewed at length with the patient today.  Concerns regarding medicines are outlined above.  No orders of the defined types were placed in this encounter.  No orders of the defined types were placed in this encounter.   Patient Instructions  Medication Instructions:  Your physician recommends that you continue on your current medications as directed. Please refer to the Current Medication list given to you today.  *If you need a refill on your cardiac medications before your next appointment, please call your pharmacy*   Lab Work: None If you have labs (blood work) drawn today and your tests are completely normal, you will receive your results only by: [name] MyChart Message (if you have MyChart) OR . A paper copy in the mail If you have any lab test that is abnormal or we need to change your treatment, we will call you to review the results.   Testing/Procedures: None   Follow-Up: At [facility], you and your health needs are our priority.  As part of our continuing mission to provide you with exceptional heart care, we have created designated Provider Care Teams.  These Care Teams include your primary Cardiologist (physician) and Advanced Practice Providers (APPs -  Physician Assistants and Nurse Practitioners) who all work together to provide you with  the care you need, when you need it.  We recommend signing up for the patient portal called "MyChart".  Sign up information is provided on this After Visit Summary.  MyChart is used to connect with patients for Virtual Visits (Telemedicine).  Patients are able to view lab/test results, encounter notes, upcoming appointments, etc.  Non-urgent messages can be sent to your provider as well.   To learn more about what you can do with MyChart, go to [email or web address].    Your next appointment:   6 month(s)  The format for your next appointment:   In Person  Provider:   You may see Sinclair Grooms, MD or one of the following Advanced Practice Providers on your designated Care Team:    Truitt Merle, NP  Cecilie Kicks, NP  Kathyrn Drown, NP    Other Instructions      Signed, Sinclair Grooms, MD  04/12/2020 10:39 AM    [place]

## 2020-04-12 ENCOUNTER — Encounter: Payer: Self-pay | Admitting: Interventional Cardiology

## 2020-04-12 ENCOUNTER — Ambulatory Visit (INDEPENDENT_AMBULATORY_CARE_PROVIDER_SITE_OTHER): Payer: Medicare Other | Admitting: Interventional Cardiology

## 2020-04-12 ENCOUNTER — Other Ambulatory Visit: Payer: Self-pay

## 2020-04-12 VITALS — BP 116/58 | HR 79 | Ht 69.0 in | Wt 184.6 lb

## 2020-04-12 DIAGNOSIS — Z7189 Other specified counseling: Secondary | ICD-10-CM

## 2020-04-12 DIAGNOSIS — I5033 Acute on chronic diastolic (congestive) heart failure: Secondary | ICD-10-CM

## 2020-04-12 DIAGNOSIS — I4891 Unspecified atrial fibrillation: Secondary | ICD-10-CM | POA: Diagnosis not present

## 2020-04-12 DIAGNOSIS — Z7901 Long term (current) use of anticoagulants: Secondary | ICD-10-CM

## 2020-04-12 DIAGNOSIS — N1832 Chronic kidney disease, stage 3b: Secondary | ICD-10-CM | POA: Diagnosis not present

## 2020-04-12 DIAGNOSIS — I6523 Occlusion and stenosis of bilateral carotid arteries: Secondary | ICD-10-CM

## 2020-04-12 DIAGNOSIS — I25119 Atherosclerotic heart disease of native coronary artery with unspecified angina pectoris: Secondary | ICD-10-CM | POA: Diagnosis not present

## 2020-04-12 DIAGNOSIS — I495 Sick sinus syndrome: Secondary | ICD-10-CM | POA: Diagnosis not present

## 2020-04-12 NOTE — Patient Instructions (Signed)

## 2020-04-16 DIAGNOSIS — C44329 Squamous cell carcinoma of skin of other parts of face: Secondary | ICD-10-CM | POA: Diagnosis not present

## 2020-04-16 DIAGNOSIS — Z85828 Personal history of other malignant neoplasm of skin: Secondary | ICD-10-CM | POA: Diagnosis not present

## 2020-04-17 ENCOUNTER — Ambulatory Visit (INDEPENDENT_AMBULATORY_CARE_PROVIDER_SITE_OTHER): Payer: Medicare Other | Admitting: Orthopaedic Surgery

## 2020-04-17 VITALS — Ht 68.5 in | Wt 185.0 lb

## 2020-04-17 DIAGNOSIS — G8929 Other chronic pain: Secondary | ICD-10-CM

## 2020-04-17 DIAGNOSIS — M1712 Unilateral primary osteoarthritis, left knee: Secondary | ICD-10-CM | POA: Diagnosis not present

## 2020-04-17 DIAGNOSIS — M25561 Pain in right knee: Secondary | ICD-10-CM | POA: Diagnosis not present

## 2020-04-17 DIAGNOSIS — M1711 Unilateral primary osteoarthritis, right knee: Secondary | ICD-10-CM

## 2020-04-17 DIAGNOSIS — M25562 Pain in left knee: Secondary | ICD-10-CM | POA: Diagnosis not present

## 2020-04-17 DIAGNOSIS — I6523 Occlusion and stenosis of bilateral carotid arteries: Secondary | ICD-10-CM | POA: Diagnosis not present

## 2020-04-17 NOTE — Progress Notes (Signed)
Office Visit Note   Patient: Jonathan Warner           Date of Birth: [birth year]           MRN: [id number] Visit Date: 04/17/2020              Requested by: Josetta Huddle, MD [address] [place],  [place] [id number] PCP: Josetta Huddle, MD   Assessment & Plan: Visit Diagnoses:  1. Chronic pain of right knee   2. Chronic pain of left knee   3. Unilateral primary osteoarthritis, left knee   4. Unilateral primary osteoarthritis, right knee     Plan: At this point we are recommending a left total knee arthroplasty.  However, we are going to wait at least a month or so since he will need to be done is not an ambulatory patient but will least need to stay overnight.  I explained in detail what the surgery involves.  He is aware of this since I have replaced his wife knee before.  I talked about the risk and benefits of surgery and how these are heightened given his comorbidities and we will have him off of Eliquis at least 4 days before surgery.  All question concerns were answered and addressed.  He understands that this will be a while before we get him scheduled.  Follow-Up Instructions: Return for 2 weeks post-op.   Orders:  No orders of the defined types were placed in this encounter.  No orders of the defined types were placed in this encounter.     Procedures: No procedures performed   Clinical Data: No additional findings.   Subjective: Chief Complaint  Patient presents with  . Right Knee - Pain  . Left Knee - Pain  The patient is someone I am seeing for the first time as a patient but he is established patient of the practice in general.  He is 86 years old and has debilitating arthritis involving both his knees.  He says the left hurts worse than the right.  He does ambulate with a cane.  He says his pain is daily and is gotten significantly worse to the point he wants to proceed with knee replacement surgery.  He even states that he is surprised he is living  this long.  He is on Eliquis and has multiple medical issues.  He says his cardiologist is okay with him having surgery but he says that certainly moderate to high risk.  He understands that he would have to come off Eliquis 4 days.  He also understands that with the Covid 19 pandemic, we have to delay his surgery until least inpatient beds are available because he is not a candidate for [facility] outpatient total knee surgery.  X-rays recently did show severe end-stage arthritis of both his knees.  At this point his knee pain is detriment affecting his mobility, his quality of life and his actives daily living.  It is 10 out of 10 on a daily basis with the left hurting worse than the right.  HPI  Review of Systems He currently denies any headache, chest pain, shortness of breath, fever, chills, nausea, vomiting  Objective: Vital Signs: Ht 5' 8.5" (1.74 m)   Wt 185 lb (83.9 kg)   BMI 27.72 kg/m   Physical Exam He is alert and oriented in no acute distress.  He does mobilize very slowly and uses a cane. Ortho Exam Examination of both knees shows varus malalignment  with significant medial joint line tenderness and patellofemoral cavitation.  Both knees are very painful throughout the arc of motion. Specialty Comments:  No specialty comments available.  Imaging: No results found. Both knees show severe end-stage arthritis with varus malalignment and medial joint space narrowing that is almost bone-on-bone.  There is significant patellofemoral disease with both knees.  PMFS History: Patient Active Problem List   Diagnosis Date Noted  . Unilateral primary osteoarthritis, left knee 04/17/2020  . Unilateral primary osteoarthritis, right knee 04/17/2020  . Coronary artery disease involving native coronary artery of native heart with angina pectoris ([place]) 11/06/2016  . Hypertensive heart and chronic kidney disease with heart failure and stage 1 through stage 4 chronic kidney disease, or chronic  kidney disease ([place]) 11/06/2016  . Chronic anticoagulation 12/01/2013  . History of radiation therapy   . Prostate cancer ([place]) 05/31/2012  . Bradycardia 05/23/2012  . Atrial fibrillation ([place]) 05/23/2012   Past Medical History:  Diagnosis Date  . Arthritis   . BPH (benign prostatic hyperplasia)   . CAD (coronary artery disease)    s/p CABG 1991  . Carotid artery occlusion   . Cataract    b/l surgery  . Chronic kidney disease (CKD), stage III (moderate)   . Diverticulosis   . DJD (degenerative joint disease)   . DJD (degenerative joint disease)   . GERD (gastroesophageal reflux disease)   . Glaucoma   . Hiatal hernia   . History of adenomatous polyp of colon 06/07/13   Last colonoscopy 06/27/13 showed small adenoma, no further testing due to advanced age.  [name] History of radiation therapy 07/25/2012-09/20/2012   prostate 7800 cGy 40 sessions, seminal vesicles 5600 cGy 40 sessions  . History of shingles   . Hypercholesteremia   . Hyperlipidemia   . Hypertension   . Inguinal hernia   . Osteomyelitis of forearm, right, acute ([place])    drainage x 2  . Persistent atrial fibrillation ([place])   . Prostate cancer ([place]) 04/27/12   Dr. Eulogio Ditch. Radiation therapy. Adenocarcinoma,gleason=3+4=7, 3+3=6,PSA=4.93  volume=51cc  . Pulmonary nodules    small rlung base nodule 4.68mm, scarring in lung bases  . 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 Hypertension Mother   . Congestive Heart Failure Mother   . CAD Mother   . Alzheimer's disease Father   . CAD Brother   . Pancreatic cancer Brother   . CVA Brother   . Hypertension Sister        Has 6 sisters, many suffer from HTN  . Heart attack Brother   . Stroke Brother     Past Surgical History:  Procedure Laterality Date  . CARDIAC CATHETERIZATION  06/04/09   left w/noted patent bypass grafts  . CATARACT  EXTRACTION W/ INTRAOCULAR LENS IMPLANT  2011  . COLONOSCOPY  12/21/2007   hx polyps, diverticulosis  . COLONOSCOPY WITH PROPOFOL N/A 06/27/2013   Procedure: COLONOSCOPY WITH PROPOFOL;  Surgeon: Garlan Fair, MD;  Location: [facility];  Service: Endoscopy;  Laterality: N/A;  . CORONARY ARTERY BYPASS GRAFT  1991   x5; prior MI  . PACEMAKER GENERATOR CHANGE  05/24/12  . PACEMAKER INSERTION  05/14/03   MDT EnPulse implanted by Dr Leonia Reeves ddd  . PERMANENT PACEMAKER GENERATOR CHANGE N/A 05/24/2012   Procedure: PERMANENT PACEMAKER GENERATOR CHANGE;  Surgeon: Thompson Grayer, MD;  Location: [facility] CATH LAB;  Service: Cardiovascular;  Laterality: N/A;  . PROSTATE BIOPSY  04/27/12   Adenocarcinoma,gleason=3+3=6, 3+4,& 4+3,PSA=4.93,volume=51cc  . TUMOR REMOVAL     skin tumors removed   Social History   Occupational History  . Occupation: Retired    Comment: Theatre stage manager units  Tobacco Use  . Smoking status: Former Smoker    Packs/day: 0.50    Years: 10.00    Pack years: 5.00    Types: Cigarettes    Quit date: 08/24/1981    Years since quitting: 38.6  . Smokeless tobacco: Never Used  Vaping Use  . Vaping Use: Never used  Substance and Sexual Activity  . Alcohol use: No  . Drug use: No  . Sexual activity: Not on file

## 2020-04-22 ENCOUNTER — Telehealth: Payer: Self-pay | Admitting: Vascular Surgery

## 2020-04-23 DIAGNOSIS — N400 Enlarged prostate without lower urinary tract symptoms: Secondary | ICD-10-CM | POA: Diagnosis not present

## 2020-04-23 DIAGNOSIS — I480 Paroxysmal atrial fibrillation: Secondary | ICD-10-CM | POA: Diagnosis not present

## 2020-04-23 DIAGNOSIS — I10 Essential (primary) hypertension: Secondary | ICD-10-CM | POA: Diagnosis not present

## 2020-04-23 DIAGNOSIS — N183 Chronic kidney disease, stage 3 unspecified: Secondary | ICD-10-CM | POA: Diagnosis not present

## 2020-04-23 DIAGNOSIS — I2510 Atherosclerotic heart disease of native coronary artery without angina pectoris: Secondary | ICD-10-CM | POA: Diagnosis not present

## 2020-04-23 DIAGNOSIS — I5033 Acute on chronic diastolic (congestive) heart failure: Secondary | ICD-10-CM | POA: Diagnosis not present

## 2020-04-23 DIAGNOSIS — I252 Old myocardial infarction: Secondary | ICD-10-CM | POA: Diagnosis not present

## 2020-04-23 DIAGNOSIS — C61 Malignant neoplasm of prostate: Secondary | ICD-10-CM | POA: Diagnosis not present

## 2020-04-23 DIAGNOSIS — M179 Osteoarthritis of knee, unspecified: Secondary | ICD-10-CM | POA: Diagnosis not present

## 2020-04-23 DIAGNOSIS — E785 Hyperlipidemia, unspecified: Secondary | ICD-10-CM | POA: Diagnosis not present

## 2020-04-23 DIAGNOSIS — H409 Unspecified glaucoma: Secondary | ICD-10-CM | POA: Diagnosis not present

## 2020-04-23 DIAGNOSIS — M159 Polyosteoarthritis, unspecified: Secondary | ICD-10-CM | POA: Diagnosis not present

## 2020-04-25 ENCOUNTER — Ambulatory Visit (INDEPENDENT_AMBULATORY_CARE_PROVIDER_SITE_OTHER): Payer: Medicare Other | Admitting: *Deleted

## 2020-04-25 DIAGNOSIS — I495 Sick sinus syndrome: Secondary | ICD-10-CM | POA: Diagnosis not present

## 2020-04-26 LAB — CUP PACEART REMOTE DEVICE CHECK
Battery Impedance: 890 Ohm
Battery Remaining Longevity: 79 mo
Battery Voltage: 2.78 V
Brady Statistic RV Percent Paced: 28 %
Date Time Interrogation Session: 20210902103758
Implantable Lead Implant Date: 20040920
Implantable Lead Implant Date: 20040920
Implantable Lead Location: 753859
Implantable Lead Location: 753860
Implantable Lead Model: 5076
Implantable Lead Model: 5092
Implantable Pulse Generator Implant Date: 20131001
Lead Channel Impedance Value: 633 Ohm
Lead Channel Impedance Value: 67 Ohm
Lead Channel Pacing Threshold Amplitude: 0.75 V
Lead Channel Pacing Threshold Pulse Width: 0.4 ms
Lead Channel Setting Pacing Amplitude: 2.5 V
Lead Channel Setting Pacing Pulse Width: 0.4 ms
Lead Channel Setting Sensing Sensitivity: 4 mV

## 2020-04-30 NOTE — Progress Notes (Signed)
Remote pacemaker transmission.   

## 2020-05-06 DIAGNOSIS — N183 Chronic kidney disease, stage 3 unspecified: Secondary | ICD-10-CM | POA: Diagnosis not present

## 2020-05-06 DIAGNOSIS — Z1389 Encounter for screening for other disorder: Secondary | ICD-10-CM | POA: Diagnosis not present

## 2020-05-06 DIAGNOSIS — E785 Hyperlipidemia, unspecified: Secondary | ICD-10-CM | POA: Diagnosis not present

## 2020-05-06 DIAGNOSIS — I5030 Unspecified diastolic (congestive) heart failure: Secondary | ICD-10-CM | POA: Diagnosis not present

## 2020-05-06 DIAGNOSIS — C61 Malignant neoplasm of prostate: Secondary | ICD-10-CM | POA: Diagnosis not present

## 2020-05-06 DIAGNOSIS — M179 Osteoarthritis of knee, unspecified: Secondary | ICD-10-CM | POA: Diagnosis not present

## 2020-05-06 DIAGNOSIS — Z23 Encounter for immunization: Secondary | ICD-10-CM | POA: Diagnosis not present

## 2020-05-06 DIAGNOSIS — I2510 Atherosclerotic heart disease of native coronary artery without angina pectoris: Secondary | ICD-10-CM | POA: Diagnosis not present

## 2020-05-06 DIAGNOSIS — I10 Essential (primary) hypertension: Secondary | ICD-10-CM | POA: Diagnosis not present

## 2020-05-06 DIAGNOSIS — E559 Vitamin D deficiency, unspecified: Secondary | ICD-10-CM | POA: Diagnosis not present

## 2020-05-06 DIAGNOSIS — Z0000 Encounter for general adult medical examination without abnormal findings: Secondary | ICD-10-CM | POA: Diagnosis not present

## 2020-05-06 DIAGNOSIS — D6869 Other thrombophilia: Secondary | ICD-10-CM | POA: Diagnosis not present

## 2020-05-06 DIAGNOSIS — I252 Old myocardial infarction: Secondary | ICD-10-CM | POA: Diagnosis not present

## 2020-05-06 DIAGNOSIS — I480 Paroxysmal atrial fibrillation: Secondary | ICD-10-CM | POA: Diagnosis not present

## 2020-05-06 DIAGNOSIS — M1711 Unilateral primary osteoarthritis, right knee: Secondary | ICD-10-CM | POA: Diagnosis not present

## 2020-05-07 DIAGNOSIS — C61 Malignant neoplasm of prostate: Secondary | ICD-10-CM | POA: Diagnosis not present

## 2020-05-08 ENCOUNTER — Ambulatory Visit: Payer: Medicare Other | Admitting: Vascular Surgery

## 2020-05-08 ENCOUNTER — Encounter (HOSPITAL_COMMUNITY): Payer: Medicare Other

## 2020-05-10 DIAGNOSIS — Z8546 Personal history of malignant neoplasm of prostate: Secondary | ICD-10-CM | POA: Diagnosis not present

## 2020-05-16 ENCOUNTER — Emergency Department (HOSPITAL_COMMUNITY)
Admission: EM | Admit: 2020-05-16 | Discharge: 2020-05-16 | Disposition: A | Discharge status: Home / Self Care | Payer: Medicare Other | Attending: Emergency Medicine | Admitting: Emergency Medicine

## 2020-05-16 ENCOUNTER — Other Ambulatory Visit: Payer: Self-pay

## 2020-05-16 ENCOUNTER — Emergency Department (HOSPITAL_COMMUNITY): Payer: Medicare Other

## 2020-05-16 DIAGNOSIS — Z7901 Long term (current) use of anticoagulants: Secondary | ICD-10-CM | POA: Insufficient documentation

## 2020-05-16 DIAGNOSIS — C61 Malignant neoplasm of prostate: Secondary | ICD-10-CM | POA: Diagnosis not present

## 2020-05-16 DIAGNOSIS — N184 Chronic kidney disease, stage 4 (severe): Secondary | ICD-10-CM | POA: Insufficient documentation

## 2020-05-16 DIAGNOSIS — G4489 Other headache syndrome: Secondary | ICD-10-CM | POA: Diagnosis not present

## 2020-05-16 DIAGNOSIS — R519 Headache, unspecified: Secondary | ICD-10-CM | POA: Diagnosis not present

## 2020-05-16 DIAGNOSIS — I480 Paroxysmal atrial fibrillation: Secondary | ICD-10-CM | POA: Diagnosis not present

## 2020-05-16 DIAGNOSIS — R42 Dizziness and giddiness: Secondary | ICD-10-CM | POA: Diagnosis not present

## 2020-05-16 DIAGNOSIS — I2510 Atherosclerotic heart disease of native coronary artery without angina pectoris: Secondary | ICD-10-CM | POA: Diagnosis not present

## 2020-05-16 DIAGNOSIS — Z951 Presence of aortocoronary bypass graft: Secondary | ICD-10-CM | POA: Insufficient documentation

## 2020-05-16 DIAGNOSIS — D6869 Other thrombophilia: Secondary | ICD-10-CM | POA: Diagnosis not present

## 2020-05-16 DIAGNOSIS — I252 Old myocardial infarction: Secondary | ICD-10-CM | POA: Diagnosis not present

## 2020-05-16 DIAGNOSIS — E559 Vitamin D deficiency, unspecified: Secondary | ICD-10-CM | POA: Diagnosis not present

## 2020-05-16 DIAGNOSIS — Z87891 Personal history of nicotine dependence: Secondary | ICD-10-CM | POA: Diagnosis not present

## 2020-05-16 DIAGNOSIS — Z923 Personal history of irradiation: Secondary | ICD-10-CM | POA: Diagnosis not present

## 2020-05-16 DIAGNOSIS — Z20822 Contact with and (suspected) exposure to covid-19: Secondary | ICD-10-CM | POA: Insufficient documentation

## 2020-05-16 DIAGNOSIS — R270 Ataxia, unspecified: Secondary | ICD-10-CM | POA: Diagnosis not present

## 2020-05-16 DIAGNOSIS — N183 Chronic kidney disease, stage 3 unspecified: Secondary | ICD-10-CM | POA: Diagnosis not present

## 2020-05-16 DIAGNOSIS — I5030 Unspecified diastolic (congestive) heart failure: Secondary | ICD-10-CM | POA: Diagnosis not present

## 2020-05-16 DIAGNOSIS — Z79899 Other long term (current) drug therapy: Secondary | ICD-10-CM | POA: Diagnosis not present

## 2020-05-16 DIAGNOSIS — I25111 Atherosclerotic heart disease of native coronary artery with angina pectoris with documented spasm: Secondary | ICD-10-CM | POA: Insufficient documentation

## 2020-05-16 DIAGNOSIS — I129 Hypertensive chronic kidney disease with stage 1 through stage 4 chronic kidney disease, or unspecified chronic kidney disease: Secondary | ICD-10-CM | POA: Diagnosis not present

## 2020-05-16 DIAGNOSIS — Z950 Presence of cardiac pacemaker: Secondary | ICD-10-CM | POA: Insufficient documentation

## 2020-05-16 DIAGNOSIS — I779 Disorder of arteries and arterioles, unspecified: Secondary | ICD-10-CM | POA: Diagnosis not present

## 2020-05-16 DIAGNOSIS — Z8546 Personal history of malignant neoplasm of prostate: Secondary | ICD-10-CM | POA: Insufficient documentation

## 2020-05-16 DIAGNOSIS — R29810 Facial weakness: Secondary | ICD-10-CM | POA: Diagnosis not present

## 2020-05-16 DIAGNOSIS — H538 Other visual disturbances: Secondary | ICD-10-CM | POA: Diagnosis not present

## 2020-05-16 DIAGNOSIS — R0902 Hypoxemia: Secondary | ICD-10-CM | POA: Diagnosis not present

## 2020-05-16 LAB — APTT: aPTT: 39 seconds — ABNORMAL HIGH (ref 24–36)

## 2020-05-16 LAB — DIFFERENTIAL
Abs Immature Granulocytes: 0.18 10*3/uL — ABNORMAL HIGH (ref 0.00–0.07)
Basophils Absolute: 0 10*3/uL (ref 0.0–0.1)
Basophils Relative: 0 %
Eosinophils Absolute: 0.1 10*3/uL (ref 0.0–0.5)
Eosinophils Relative: 2 %
Immature Granulocytes: 2 %
Lymphocytes Relative: 10 %
Lymphs Abs: 0.8 10*3/uL (ref 0.7–4.0)
Monocytes Absolute: 1 10*3/uL (ref 0.1–1.0)
Monocytes Relative: 12 %
Neutro Abs: 5.9 10*3/uL (ref 1.7–7.7)
Neutrophils Relative %: 74 %

## 2020-05-16 LAB — RESPIRATORY PANEL BY RT PCR (FLU A&B, COVID)
Influenza A by PCR: NEGATIVE
Influenza B by PCR: NEGATIVE
SARS Coronavirus 2 by RT PCR: NEGATIVE

## 2020-05-16 LAB — COMPREHENSIVE METABOLIC PANEL
ALT: 15 U/L (ref 0–44)
AST: 21 U/L (ref 15–41)
Albumin: 3.5 g/dL (ref 3.5–5.0)
Alkaline Phosphatase: 83 U/L (ref 38–126)
Anion gap: 13 (ref 5–15)
BUN: 22 mg/dL (ref 8–23)
CO2: 26 mmol/L (ref 22–32)
Calcium: 8.8 mg/dL — ABNORMAL LOW (ref 8.9–10.3)
Chloride: 96 mmol/L — ABNORMAL LOW (ref 98–111)
Creatinine, Ser: 1.25 mg/dL — ABNORMAL HIGH (ref 0.61–1.24)
GFR calc Af Amer: >60 mL/min (ref >60)
GFR calc non Af Amer: 52 mL/min — ABNORMAL LOW (ref >60)
Glucose, Bld: 123 mg/dL — ABNORMAL HIGH (ref 70–99)
Potassium: 3.7 mmol/L (ref 3.5–5.1)
Sodium: 135 mmol/L (ref 135–145)
Total Bilirubin: 1 mg/dL (ref 0.3–1.2)
Total Protein: 6.6 g/dL (ref 6.5–8.1)

## 2020-05-16 LAB — CBC
HCT: 40.9 % (ref 39.0–52.0)
Hemoglobin: 13.2 g/dL (ref 13.0–17.0)
MCH: 32.8 pg (ref 26.0–34.0)
MCHC: 32.3 g/dL (ref 30.0–36.0)
MCV: 101.7 fL — ABNORMAL HIGH (ref 80.0–100.0)
Platelets: 169 10*3/uL (ref 150–400)
RBC: 4.02 MIL/uL — ABNORMAL LOW (ref 4.22–5.81)
RDW: 13.1 % (ref 11.5–15.5)
WBC: 8 10*3/uL (ref 4.0–10.5)
nRBC: 0 % (ref 0.0–0.2)

## 2020-05-16 LAB — URINALYSIS, ROUTINE W REFLEX MICROSCOPIC
Bilirubin Urine: NEGATIVE
Glucose, UA: NEGATIVE mg/dL
Hgb urine dipstick: NEGATIVE
Ketones, ur: NEGATIVE mg/dL
Leukocytes,Ua: NEGATIVE
Nitrite: NEGATIVE
Protein, ur: NEGATIVE mg/dL
Specific Gravity, Urine: 1.015 (ref 1.005–1.030)
pH: 6 (ref 5.0–8.0)

## 2020-05-16 LAB — RAPID URINE DRUG SCREEN, HOSP PERFORMED
Amphetamines: NOT DETECTED
Barbiturates: NOT DETECTED
Benzodiazepines: NOT DETECTED
Cocaine: NOT DETECTED
Opiates: NOT DETECTED
Tetrahydrocannabinol: NOT DETECTED

## 2020-05-16 LAB — PROTIME-INR
INR: 1.4 — ABNORMAL HIGH (ref 0.8–1.2)
Prothrombin Time: 16.9 seconds — ABNORMAL HIGH (ref 11.4–15.2)

## 2020-05-16 LAB — ETHANOL: Alcohol, Ethyl (B): <10 mg/dL (ref <10)

## 2020-05-16 LAB — TROPONIN I (HIGH SENSITIVITY)
Troponin I (High Sensitivity): 33 ng/L — ABNORMAL HIGH (ref <18)
Troponin I (High Sensitivity): 26 ng/L — ABNORMAL HIGH (ref <18)

## 2020-05-16 MED ORDER — MECLIZINE HCL 25 MG PO TABS: 25.0000 mg | ORAL_TABLET | Freq: Three times a day (TID) | ORAL | 0 refills | Status: DC | PRN

## 2020-05-16 MED ORDER — MECLIZINE HCL 25 MG PO TABS
25.0000 mg | ORAL_TABLET | Freq: Once | ORAL | Status: AC
  Administered 2020-05-16: 25 mg via ORAL
  Filled 2020-05-16: qty 1

## 2020-05-16 MED ORDER — ACETAMINOPHEN 500 MG PO TABS
1000.0000 mg | ORAL_TABLET | Freq: Once | ORAL | Status: AC
  Administered 2020-05-16: 1000 mg via ORAL
  Filled 2020-05-16: qty 2

## 2020-05-16 NOTE — ED Triage Notes (Signed)
Pt complains of headache x5 days. Has been unsteady on feet with weakness. Was seen at neurologist today and they wanted him to come get checked out in ED. A&O x4. Has had blurry vision. No SOB or Chest pain.

## 2020-05-16 NOTE — ED Provider Notes (Signed)
Medical screening examination/treatment/procedure(s) were conducted as a shared visit with non-physician practitioner(s) and myself.  I personally evaluated the patient during the encounter.  EKG Interpretation  Date/Time:  Thursday May 16 2020 17:45:46 EDT Ventricular Rate:  74 PR Interval:    QRS Duration: 133 QT Interval:  396 QTC Calculation: 440 R Axis:   -8 Text Interpretation: Atrial fibrillation Right bundle branch block Minimal ST elevation, inferior leads Confirmed by Lacretia Leigh ([id number]) on 05/16/2020 5:42:26 PM  86 year old male here complaining of dizziness with mild headache. History of vertigo and this is slightly similar. Head CT negative. On exam patient does have some dizziness is worse with movement of his head. Suspect peripheral vertigo as opposed to central. Labs here are reassuring with exception of elevated troponin. We'll repeat that. He has not had any chest pain or chest pressure. Will discharge home with patient's troponins are decreasing   Lacretia Leigh, MD 05/16/20 2130

## 2020-05-16 NOTE — ED Notes (Signed)
Pt ambulated around in room. Needed this RN for minimal stability due to using a walker at home. Pt states there was some dizziness when standing but resolved.

## 2020-05-16 NOTE — ED Notes (Signed)
Verbalized understanding of DC instructions, Rx, follow up care 

## 2020-05-16 NOTE — Discharge Instructions (Addendum)
You have been evaluated for your dizziness.  Please take meclizine as needed for your symptoms.  Follow-up closely with your doctor for further care.  Return if you have any concern.  Also consider follow-up with your cardiologist for outpatient checkup.

## 2020-05-16 NOTE — ED Provider Notes (Signed)
[facility] EMERGENCY DEPARTMENT Provider Note   CSN: [id number] Arrival date & time: 05/16/20  1707     History Chief Complaint  Patient presents with  . Weakness    Jonathan Warner is a 86 y.o. male.  The history is provided by the patient and medical records. No language interpreter was used.  Weakness    86 year old male with history of atrial fibrillation currently on Eliquis, CAD, CKD, prostate cancer, presenting complaining of dizziness.  Patient report for nearly a week he has been experiencing bouts of dizziness.  He described more as a sensation of falling if he moves his head too quick with positional changes.  He feels unsteady on his feet.  Furthermore, he also endorsed a very mild achy headache to the back of his head has been waxing waning but improved with Tylenol.  He did notice some blurry vision but states that his vision is poor.  States that he has glaucoma affecting his left eye causing loss of peripheral vision in the past.  He denies associated fever chills no runny nose sneezing or coughing no loss of taste or smell denies any focal numbness or focal weakness or confusion.  No ear pain or ringing in ear.  No recent medication changes.  He has been compliant with his medications including his Eliquis.  He was evaluated by his PCP today for his symptoms but was recommended to come to ER for further work-up.  He is fully vaccinated for COVID-19.  Patient lives at home with his wife, he walks with a walker.  He denies any recent fall.  Past Medical History:  Diagnosis Date  . Arthritis   . BPH (benign prostatic hyperplasia)   . CAD (coronary artery disease)    s/p CABG 1991  . Carotid artery occlusion   . Cataract    b/l surgery  . Chronic kidney disease (CKD), stage III (moderate)   . Diverticulosis   . DJD (degenerative joint disease)   . DJD (degenerative joint disease)   . GERD (gastroesophageal reflux disease)   . Glaucoma   . Hiatal  hernia   . History of adenomatous polyp of colon 06/07/13   Last colonoscopy 06/27/13 showed small adenoma, no further testing due to advanced age.  [name] History of radiation therapy 07/25/2012-09/20/2012   prostate 7800 cGy 40 sessions, seminal vesicles 5600 cGy 40 sessions  . History of shingles   . Hypercholesteremia   . Hyperlipidemia   . Hypertension   . Inguinal hernia   . Osteomyelitis of forearm, right, acute ([place])    drainage x 2  . Persistent atrial fibrillation ([place])   . Prostate cancer ([place]) 04/27/12   Dr. Eulogio Ditch. Radiation therapy. Adenocarcinoma,gleason=3+4=7, 3+3=6,PSA=4.93  volume=51cc  . Pulmonary nodules    small rlung base nodule 4.22mm, scarring in lung bases  . 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 Unilateral primary osteoarthritis, left knee 04/17/2020  . Unilateral primary osteoarthritis, right knee 04/17/2020  . Coronary artery disease involving native coronary artery of native heart with angina pectoris ([place]) 11/06/2016  . Hypertensive heart and chronic kidney disease with heart failure and stage 1 through stage 4 chronic kidney disease, or chronic kidney disease ([place]) 11/06/2016  . Chronic anticoagulation 12/01/2013  . History of radiation therapy   . Prostate cancer ([place]) 05/31/2012  .  Bradycardia 05/23/2012  . Atrial fibrillation ([place]) 05/23/2012    Past Surgical History:  Procedure Laterality Date  . CARDIAC CATHETERIZATION  06/04/09   left w/noted patent bypass grafts  . CATARACT EXTRACTION W/ INTRAOCULAR LENS IMPLANT  2011  . COLONOSCOPY  12/21/2007   hx polyps, diverticulosis  . COLONOSCOPY WITH PROPOFOL N/A 06/27/2013   Procedure: COLONOSCOPY WITH PROPOFOL;  Surgeon: Garlan Fair, MD;  Location: [facility] ENDOSCOPY;  Service: Endoscopy;  Laterality: N/A;  . CORONARY ARTERY BYPASS GRAFT  1991   x5; prior MI  . PACEMAKER  GENERATOR CHANGE  05/24/12  . PACEMAKER INSERTION  05/14/03   MDT EnPulse implanted by Dr Leonia Reeves ddd  . PERMANENT PACEMAKER GENERATOR CHANGE N/A 05/24/2012   Procedure: PERMANENT PACEMAKER GENERATOR CHANGE;  Surgeon: Thompson Grayer, MD;  Location: [facility] CATH LAB;  Service: Cardiovascular;  Laterality: N/A;  . PROSTATE BIOPSY  04/27/12   Adenocarcinoma,gleason=3+3=6, 3+4,& 4+3,PSA=4.93,volume=51cc  . TUMOR REMOVAL     skin tumors removed       Family History  Problem Relation Age of Onset  . Hypertension Mother   . Congestive Heart Failure Mother   . CAD Mother   . Alzheimer's disease Father   . CAD Brother   . Pancreatic cancer Brother   . CVA Brother   . Hypertension Sister        Has 6 sisters, many suffer from HTN  . Heart attack Brother   . Stroke Brother     Social History   Tobacco Use  . Smoking status: Former Smoker    Packs/day: 0.50    Years: 10.00    Pack years: 5.00    Types: Cigarettes    Quit date: 08/24/1981    Years since quitting: 38.7  . Smokeless tobacco: Never Used  Vaping Use  . Vaping Use: Never used  Substance Use Topics  . Alcohol use: No  . Drug use: No    Home Medications Prior to Admission medications   Medication Sig Start Date End Date Taking? Authorizing Provider  acetaminophen (TYLENOL) 500 MG tablet Take 1,000 mg by mouth every 6 (six) hours as needed for pain.     [provider]  amLODipine (NORVASC) 5 MG tablet TAKE 1 TABLET BY MOUTH EVERY DAY 03/05/20   Belva Crome, MD  apixaban (ELIQUIS) 5 MG TABS tablet Take 5 mg by mouth 2 (two) times daily.    Josetta Huddle, MD  brimonidine [facility]) 0.2 % ophthalmic solution Place 1 drop into both eyes 3 times daily. 09/27/18   [provider]  Cholecalciferol (VITAMIN D3) 2000 units TABS Take 1 tablet by mouth daily.     [provider]  dorzolamide (TRUSOPT) 2 % ophthalmic solution Place 1 drop into both eyes 3 times daily. 11/20/17   [provider]    furosemide (LASIX) 40 MG tablet TAKE 2 TABLETS BY MOUTH EVERY MORNING *NEED APPT* 03/05/20   Belva Crome, MD  latanoprost (XALATAN) 0.005 % ophthalmic solution Apply 1 drop to eye at bedtime.     [provider]  Multiple Vitamin (MULTIVITAMIN) tablet Take 1 tablet by mouth daily.    [provider]  nitroGLYCERIN (NITROSTAT) 0.4 MG SL tablet PLACE 1 TABLET UNDER TONGUE EVERY FIVE MINUTES AS NEEDED FOR CHEST PAIN. 12/08/19   Belva Crome, MD  omeprazole (PRILOSEC) 20 MG capsule Take 20 mg by mouth daily.    [provider]  potassium chloride SA (KLOR-CON M20) 20 MEQ tablet TAKE 1&1/2 TABLETS  BY MOUTH EVERY DAY *NEED APPT* 03/05/20   Belva Crome, MD  spironolactone (ALDACTONE) 25 MG tablet Take 12.5 mg by mouth daily.    [provider]  vitamin E 400 UNIT capsule Take 400 Units by mouth daily.    [provider]    Allergies    Pilocarpine and Rosuvastatin  Review of Systems   Review of Systems  Neurological: Positive for weakness.  All other systems reviewed and are negative.   Physical Exam Updated Vital Signs BP 131/88 (BP Location: Left Arm)   Pulse 78   Temp 97.9 F (36.6 C) (Oral)   Resp 13   SpO2 99%   Physical Exam Vitals and nursing note reviewed.  Constitutional:      General: He is not in acute distress.    Appearance: He is well-developed.     Comments: Elderly male resting comfortably in bed in no acute discomfort  HENT:     Head: Atraumatic.  Eyes:     Extraocular Movements: Extraocular movements intact.     Conjunctiva/sclera: Conjunctivae normal.     Pupils: Pupils are equal, round, and reactive to light.     Comments: No nystagmus noted.  Decreased left peripheral vision  Cardiovascular:     Rate and Rhythm: Rhythm irregular.     Comments: Irregularly irregular heart rhythm. Pulmonary:     Effort: Pulmonary effort is normal.     Breath sounds: Normal breath sounds.  Abdominal:     Palpations: Abdomen  is soft.     Tenderness: There is no abdominal tenderness.  Musculoskeletal:     Cervical back: Neck supple.     Comments: 5 out of 5 strength all 4 extremities.  Skin:    Findings: No rash.  Neurological:     Mental Status: He is alert and oriented to person, place, and time.     GCS: GCS eye subscore is 4. GCS verbal subscore is 5. GCS motor subscore is 6.     Cranial Nerves: Cranial nerves are intact.     Sensory: Sensation is intact.     Motor: Motor function is intact. No weakness.     Coordination: Coordination is intact.     ED Results / Procedures / Treatments   Labs (all labs ordered are listed, but only abnormal results are displayed) Labs Reviewed  PROTIME-INR - Abnormal; Notable for the following components:      Result Value   Prothrombin Time 16.9 (*)    INR 1.4 (*)    All other components within normal limits  APTT - Abnormal; Notable for the following components:   aPTT 39 (*)    All other components within normal limits  CBC - Abnormal; Notable for the following components:   RBC 4.02 (*)    MCV 101.7 (*)    All other components within normal limits  DIFFERENTIAL - Abnormal; Notable for the following components:   Abs Immature Granulocytes 0.18 (*)    All other components within normal limits  COMPREHENSIVE METABOLIC PANEL - Abnormal; Notable for the following components:   Chloride 96 (*)    Glucose, Bld 123 (*)    Creatinine, Ser 1.25 (*)    Calcium 8.8 (*)    GFR calc non Af Amer 52 (*)    All other components within normal limits  TROPONIN I (HIGH SENSITIVITY) - Abnormal; Notable for the following components:   Troponin I (High Sensitivity) 33 (*)    All other components within normal  limits  TROPONIN I (HIGH SENSITIVITY) - Abnormal; Notable for the following components:   Troponin I (High Sensitivity) 26 (*)    All other components within normal limits  RESPIRATORY PANEL BY RT PCR (FLU A&B, COVID)  ETHANOL  RAPID URINE DRUG SCREEN, HOSP  PERFORMED  URINALYSIS, ROUTINE W REFLEX MICROSCOPIC    EKG EKG Interpretation  Date/Time:  Thursday May 16 2020 17:45:46 EDT Ventricular Rate:  74 PR Interval:    QRS Duration: 133 QT Interval:  396 QTC Calculation: 440 R Axis:   -8 Text Interpretation: Atrial fibrillation Right bundle branch block Minimal ST elevation, inferior leads Confirmed by Lacretia Leigh ([id number]) on 05/16/2020 5:52:36 PM   Radiology CT HEAD WO CONTRAST  Result Date: 05/16/2020 CLINICAL DATA:  Headache for 5 days, blurred vision EXAM: CT HEAD WITHOUT CONTRAST TECHNIQUE: Contiguous axial images were obtained from the base of the skull through the vertex without intravenous contrast. COMPARISON:  None. FINDINGS: Brain: No acute infarct or hemorrhage. Lateral ventricles and midline structures are unremarkable. No acute extra-axial fluid collections. No mass effect. Vascular: No hyperdense vessel or unexpected calcification. Skull: Normal. Negative for fracture or focal lesion. Sinuses/Orbits: No acute finding. Other: None. IMPRESSION: 1. No acute intracranial process. Electronically Signed   By: Randa Ngo M.D.   On: 05/16/2020 19:16    Procedures Procedures (including critical care time)  Medications Ordered in ED Medications  meclizine (ANTIVERT) tablet 25 mg (25 mg Oral Given 05/16/20 2050)  acetaminophen (TYLENOL) tablet 1,000 mg (1,000 mg Oral Given 05/16/20 2050)    ED Course  I have reviewed the triage vital signs and the nursing notes.  Pertinent labs & imaging results that were available during my care of the patient were reviewed by me and considered in my medical decision making (see chart for details).    MDM Rules/Calculators/A&P                          BP 131/88 (BP Location: Left Arm)   Pulse 78   Temp 97.9 F (36.6 C) (Oral)   Resp 13   Ht 5\' 8"  (1.727 m)   Wt 83.5 kg   SpO2 99%   BMI 27.98 kg/m   Final Clinical Impression(s) / ED Diagnoses Final diagnoses:  Vertigo     Rx / DC Orders ED Discharge Orders         Ordered    meclizine (ANTIVERT) 25 MG tablet  3 times daily PRN        05/16/20 2315         5:43 PM Elderly male with history of atrial fibrillation currently on Eliquis presenting with complaints of intermittent bouts of dizziness as well as having headache.  This has been ongoing for nearly a week.  PCP was concerned and recommend patient come here.  At this time he does not have any focal neuro deficit on exam however due to his significant risk factors of potential stroke including a prior cancer, A. fib on Eliquis, will obtain head CT scan and if negative, will obtain brain MRI for further evaluation.  Initial EKG shows atrial fibrillation that is rate controlled.  Initially listed as acute MI however patient does not have any active chest pain.  Will obtain troponin.  Care discussed with DR. Allen.   7:17 PM Mildly elevated troponin of 33, will repeat EKG.  I discussed this with Dr. Zenia Resides.  Since patient has a pacemaker that is not compatible  with MRI, I am unable to obtain brain MRI or to rule out posterior circulation stroke.  I discussed this with Dr. Zenia Resides.  We felt that this is not likely to be central vertigo.  Patient given meclizine.  7:39 PM Appreciate consultation, cardiologist Dr. Sallyanne Kuster who have reviewed patient's EKG and felt this is not consistent with ACS or STEMI.  At this time patient feels much better, able to ambulate, no active chest pain, he would like to be discharged.  Patient discharged home with meclizine.  He will follow-up with Dr. As well as cardiologist if needed.  Return precaution given.  His COVID-19 test is negative.  Jonathan Warner was evaluated in Emergency Department on 05/16/2020 for the symptoms described in the history of present illness. He was evaluated in the context of the global COVID-19 pandemic, which necessitated consideration that the patient might be at risk for infection with the SARS-CoV-2  virus that causes COVID-19. Institutional protocols and algorithms that pertain to the evaluation of patients at risk for COVID-19 are in a state of rapid change based on information released by regulatory bodies including the CDC and federal and state organizations. These policies and algorithms were followed during the patient's care in the ED.    Domenic Moras, PA-C 05/16/20 2317    Lacretia Leigh, MD 05/20/20 [id number]

## 2020-06-08 ENCOUNTER — Ambulatory Visit: Payer: Medicare Other | Attending: Internal Medicine

## 2020-06-08 DIAGNOSIS — Z23 Encounter for immunization: Secondary | ICD-10-CM

## 2020-06-08 NOTE — Progress Notes (Signed)
   Covid-19 Vaccination Clinic  Name:  Jonathan Warner    MRN: [id number] DOB: 03/28/1932  06/08/2020  [name] was observed post Covid-19 immunization for 15 minutes without incident. He was provided with Vaccine Information Sheet and instruction to access the V-Safe system.   [name] was instructed to call 911 with any severe reactions post vaccine: [name] Difficulty breathing  . Swelling of face and throat  . A fast heartbeat  . A bad rash all over body  . Dizziness and weakness

## 2020-06-13 ENCOUNTER — Other Ambulatory Visit: Payer: Self-pay | Admitting: Interventional Cardiology

## 2020-06-21 ENCOUNTER — Telehealth: Payer: Self-pay | Admitting: Interventional Cardiology

## 2020-06-21 NOTE — Telephone Encounter (Signed)
I called the pt back in regards to clearance information. Pt said he just wanted to let our office know that a clearance form will be faxed to our office. Pt then did tell me that he is having some swelling in his ankles now for a few days and was not sure if his medication needs to be increased. I assured the pt that I will have Marveen Reeks RN call him back to discuss swelling further. Pt thanked me for the call and the help.

## 2020-06-21 NOTE — Telephone Encounter (Signed)
Jonathan Warner is calling stating he is wanting to speak with the nurse about a knee replacement he is needing. He states the preforming office is sending over the information regarding it. Please advise.

## 2020-06-22 DIAGNOSIS — M179 Osteoarthritis of knee, unspecified: Secondary | ICD-10-CM | POA: Diagnosis not present

## 2020-06-22 DIAGNOSIS — I10 Essential (primary) hypertension: Secondary | ICD-10-CM | POA: Diagnosis not present

## 2020-06-22 DIAGNOSIS — I2510 Atherosclerotic heart disease of native coronary artery without angina pectoris: Secondary | ICD-10-CM | POA: Diagnosis not present

## 2020-06-22 DIAGNOSIS — M1711 Unilateral primary osteoarthritis, right knee: Secondary | ICD-10-CM | POA: Diagnosis not present

## 2020-06-22 DIAGNOSIS — I5033 Acute on chronic diastolic (congestive) heart failure: Secondary | ICD-10-CM | POA: Diagnosis not present

## 2020-06-22 DIAGNOSIS — I480 Paroxysmal atrial fibrillation: Secondary | ICD-10-CM | POA: Diagnosis not present

## 2020-06-22 DIAGNOSIS — I5030 Unspecified diastolic (congestive) heart failure: Secondary | ICD-10-CM | POA: Diagnosis not present

## 2020-06-22 DIAGNOSIS — N183 Chronic kidney disease, stage 3 unspecified: Secondary | ICD-10-CM | POA: Diagnosis not present

## 2020-06-22 DIAGNOSIS — C61 Malignant neoplasm of prostate: Secondary | ICD-10-CM | POA: Diagnosis not present

## 2020-06-22 DIAGNOSIS — E785 Hyperlipidemia, unspecified: Secondary | ICD-10-CM | POA: Diagnosis not present

## 2020-06-22 DIAGNOSIS — H409 Unspecified glaucoma: Secondary | ICD-10-CM | POA: Diagnosis not present

## 2020-06-22 DIAGNOSIS — I252 Old myocardial infarction: Secondary | ICD-10-CM | POA: Diagnosis not present

## 2020-06-24 ENCOUNTER — Telehealth: Payer: Self-pay | Admitting: *Deleted

## 2020-06-24 DIAGNOSIS — I25119 Atherosclerotic heart disease of native coronary artery with unspecified angina pectoris: Secondary | ICD-10-CM

## 2020-06-24 DIAGNOSIS — I130 Hypertensive heart and chronic kidney disease with heart failure and stage 1 through stage 4 chronic kidney disease, or unspecified chronic kidney disease: Secondary | ICD-10-CM

## 2020-06-24 DIAGNOSIS — Z01818 Encounter for other preprocedural examination: Secondary | ICD-10-CM

## 2020-06-24 NOTE — Telephone Encounter (Signed)
° °  [facility] Pre-operative Risk Assessment    [facility] STAFF: - Please ensure there is not already an duplicate clearance open for this procedure. - Under Visit Info/Reason for Call, type in Other and utilize the format Clearance MM/DD/YY or Clearance TBD. Do not use dashes or single digits. - If request is for dental extraction, please clarify the # of teeth to be extracted.  Request for surgical clearance:  1. What type of surgery is being performed? LEFT TOTAL KNEE ARTHROPLASTY   2. When is this surgery scheduled? TBD   3. What type of clearance is required (medical clearance vs. Pharmacy clearance to hold med vs. Both)? BOTH  4. Are there any medications that need to be held prior to surgery and how long? ELIQUIS x 4 DAYS PRIOR TO SURGERY   5. Practice name and name of physician performing surgery? [facility]; DR. Jean Rosenthal   6. What is the office phone number? [phone]   7.   What is the office fax number? [place].   Anesthesia type (None, local, MAC, general) ? SPINAL & BLOCK   Julaine Hua 06/24/2020, 9:09 AM  _________________________________________________________________   (provider comments below)

## 2020-06-24 NOTE — Telephone Encounter (Signed)
Late entry from 10/29.  Spoke with pt and he states he has some swelling in bilateral ankles.  States he has been eating out more the last few days.  Denies adding salt to food.  Takes Furosemide 80mg  QD.  Denies SOB or orthopnea. Pt does not wear compression stockings.  Advised pt to cut back on salt and wear compression stockings over the weekend.  Advised to elevate feet as much as possible to see if this helps resolve the swelling.  Advised to call back early next week if no improvement.

## 2020-06-24 NOTE — Telephone Encounter (Signed)
Patient with diagnosis of A Fib on Eliquis for anticoagulation.    Procedure: LEFT TOTAL KNEE ARTHROPLASTY  Date of procedure: TBD  CHA2DS2-VASc Score = 5  This indicates a 7.2% annual risk of stroke. The patient's score is based upon: CHF History: 1 HTN History: 1 Diabetes History: 0 Stroke History: 0 Vascular Disease History: 1 Age Score: 2 Gender Score: 0  CrCl 50.34 mL/min Platelet count 169  Per office protocol, patient can hold Eliquis for 3 days prior to procedure.    Patient will not need bridging with Lovenox (enoxaparin) around procedure.

## 2020-06-24 NOTE — Telephone Encounter (Signed)
Clinical pharmacist to review Eliquis 

## 2020-06-24 NOTE — Telephone Encounter (Signed)
Per Dr. Thompson Caul last note, "Prior to any surgical procedure or significant rehab he would need to have a myocardial perfusion study done to exclude the possibility of high ischemic risk."  Please arrange myoview for preop clearance purpose.

## 2020-06-25 ENCOUNTER — Telehealth: Payer: Self-pay | Admitting: Radiology

## 2020-06-25 ENCOUNTER — Telehealth: Payer: Self-pay

## 2020-06-25 NOTE — Telephone Encounter (Signed)
Called th requesting office and spoke with the triage nurse since the surgery scheduler for Dr. Ninfa Linden was not in the office. I informed the triage nurse that per Dr. Thompson Caul last office note that the patient would need to have a Myoview/Lexiscan prior to any surgical procedure or significant rehab to exclude the possibility of high risk ischemic risk. Will place the order for the Myoview/Lexiscan.

## 2020-06-25 NOTE — Telephone Encounter (Signed)
Baxter Flattery called to let us know that patient has LEXI scan scheduled for 07/01/20, depending on this if he is cleared for surgery, he has been cleared to stop Eliquis for 3 days prior to surgery.

## 2020-06-25 NOTE — Telephone Encounter (Signed)
Called the home number listed for the patient and the line was busy. Left a voice message for the patient to give the office a call and ask for the pre-op callback pool.

## 2020-06-25 NOTE — Telephone Encounter (Signed)
Baxter Flattery from [facility] called stating before patient could be cleared for surgery he would need a myocardial scan (LEXI scan) to rule out high ischemic risk. She stated that they would put the order in for this and would reach out to patient to advise him this would need to be done per Dr [name] before he could proceed with having any surgery by Dr Ninfa Linden. Contact number for Baxter Flattery with [facility] is [phone]

## 2020-06-25 NOTE — Telephone Encounter (Signed)
Patient is scheduled on 07/01/20 for myoview/lexiscan. Will call the requesting office and inform them of the information.

## 2020-06-26 ENCOUNTER — Ambulatory Visit (INDEPENDENT_AMBULATORY_CARE_PROVIDER_SITE_OTHER)
Admission: RE | Admit: 2020-06-26 | Discharge: 2020-06-26 | Disposition: A | Discharge status: Home / Self Care | Payer: Medicare Other | Source: Ambulatory Visit | Attending: Vascular Surgery | Admitting: Vascular Surgery

## 2020-06-26 ENCOUNTER — Telehealth (HOSPITAL_COMMUNITY): Payer: Self-pay | Admitting: *Deleted

## 2020-06-26 ENCOUNTER — Ambulatory Visit (HOSPITAL_COMMUNITY)
Admission: RE | Admit: 2020-06-26 | Discharge: 2020-06-26 | Disposition: A | Discharge status: Home / Self Care | Payer: Medicare Other | Source: Ambulatory Visit | Attending: Vascular Surgery | Admitting: Vascular Surgery

## 2020-06-26 ENCOUNTER — Encounter (HOSPITAL_COMMUNITY): Payer: Self-pay | Admitting: *Deleted

## 2020-06-26 ENCOUNTER — Ambulatory Visit (INDEPENDENT_AMBULATORY_CARE_PROVIDER_SITE_OTHER): Payer: Medicare Other | Admitting: Vascular Surgery

## 2020-06-26 ENCOUNTER — Encounter: Payer: Self-pay | Admitting: Vascular Surgery

## 2020-06-26 ENCOUNTER — Other Ambulatory Visit: Payer: Self-pay

## 2020-06-26 VITALS — BP 160/72 | HR 96 | Temp 98.0°F | Resp 20 | Ht 68.0 in | Wt 187.0 lb

## 2020-06-26 DIAGNOSIS — I739 Peripheral vascular disease, unspecified: Secondary | ICD-10-CM

## 2020-06-26 DIAGNOSIS — I872 Venous insufficiency (chronic) (peripheral): Secondary | ICD-10-CM | POA: Diagnosis not present

## 2020-06-26 DIAGNOSIS — I6523 Occlusion and stenosis of bilateral carotid arteries: Secondary | ICD-10-CM

## 2020-06-26 NOTE — Telephone Encounter (Signed)
Patient given detailed instructions per Myocardial Perfusion Study Information Sheet for the test on 07/01/2020 at 0730. Patient notified to arrive 15 minutes early and that it is imperative to arrive on time for appointment to keep from having the test rescheduled.  If you need to cancel or reschedule your appointment, please call the office within 24 hours of your appointment. . Patient verbalized understanding.[place] Mychart letter sent with instructions

## 2020-06-26 NOTE — Progress Notes (Signed)
REASON FOR VISIT:   Follow-up of bilateral carotid disease  MEDICAL ISSUES:   BILATERAL CAROTID STENOSES: He has bilateral 60 to 79% carotid stenoses.  He is asymptomatic.  These are stable compared to the study 6 months ago.  I previously talked him about taking 81 mg of aspirin daily and a statin he feels strongly against this.  He is on Eliquis.  I have ordered a follow-up carotid duplex scan in 6 months and I will see him back at that time.  He understands we would not consider carotid endarterectomy unless the stenosis progressed to greater than 80% and develop new hemispheric symptoms.  I will see him back in 6 months.  He knows to call sooner if he has problems.  PERIPHERAL VASCULAR DISEASE: The patient's activity is limited by the arthritis in his knees.  I do not get any clear-cut history of claudication or rest pain.  I cannot palpate pedal pulses.  However, his noninvasive studies look good today with biphasic signals in both feet normal ABIs and normal toe pressures.  His ABIs may be falsely elevated because of calcific disease but his toe pressures were good so he should have adequate circulation to heal a knee replacement on either side from a vascular standpoint.  Certainly there are other considerations given his age.   HPI:   Jonathan Warner is a pleasant 86 y.o. male whom I have been following with bilateral carotid stenoses.  He is asymptomatic.  He comes in for routine 67-month follow-up visit.  Since I saw him last he denies any history of stroke, TIAs, expressive or receptive aphasia, or amaurosis fugax.  He is on Eliquis for atrial fibrillation and for this reason does not want to take aspirin.  I previously discussed this with him.  I also discussed the low-dose statin with him he feels strongly against taking this.  His main complaint today is terrible arthritis in both knees which limit his activity significantly.  He is being considered for knee replacements.  When I saw  him last time I told him we would obtain formal ABIs to help determine if he would be a candidate from a vascular standpoint for knee replacement.  I do not get any symptoms of claudication although given his activity is limited by his knee arthritis.  He denies any history of rest pain or nonhealing ulcers.  He did have some congestive heart failure earlier in the year that was treated with diuretics and is doing much better now.  Past Medical History:  Diagnosis Date  . Arthritis   . BPH (benign prostatic hyperplasia)   . CAD (coronary artery disease)    s/p CABG 1991  . Carotid artery occlusion   . Cataract    b/l surgery  . Chronic kidney disease (CKD), stage III (moderate) (HCC)   . Diverticulosis   . DJD (degenerative joint disease)   . DJD (degenerative joint disease)   . GERD (gastroesophageal reflux disease)   . Glaucoma   . Hiatal hernia   . History of adenomatous polyp of colon 06/07/13   Last colonoscopy 06/27/13 showed small adenoma, no further testing due to advanced age.  [name] History of radiation therapy 07/25/2012-09/20/2012   prostate 7800 cGy 40 sessions, seminal vesicles 5600 cGy 40 sessions  . History of shingles   . Hypercholesteremia   . Hyperlipidemia   . Hypertension   . Inguinal hernia   . Osteomyelitis of forearm, right, acute ([place])  drainage x 2  . Persistent atrial fibrillation ([place])   . Prostate cancer ([place]) 04/27/12   Dr. Eulogio Ditch. Radiation therapy. Adenocarcinoma,gleason=3+4=7, 3+3=6,PSA=4.93  volume=51cc  . Pulmonary nodules    small rlung base nodule 4.59mm, scarring in lung bases  . 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 Hypertension Mother   . Congestive Heart Failure Mother   . CAD Mother   . Alzheimer's disease Father   . CAD Brother   . Pancreatic cancer Brother   . CVA Brother   . Hypertension Sister         Has 6 sisters, many suffer from HTN  . Heart attack Brother   . Stroke Brother     SOCIAL HISTORY: Social History   Tobacco Use  . Smoking status: Former Smoker    Packs/day: 0.50    Years: 10.00    Pack years: 5.00    Types: Cigarettes    Quit date: 08/24/1981    Years since quitting: 38.8  . Smokeless tobacco: Never Used  Substance Use Topics  . Alcohol use: No    Allergies  Allergen Reactions  . Pilocarpine Other (See Comments)    Made him feel very funny feeling.  . Rosuvastatin Other (See Comments)    Knee and leg pain Knee and leg pain    Current Outpatient Medications  Medication Sig Dispense Refill  . acetaminophen (TYLENOL) 500 MG tablet Take 1,000 mg by mouth every 6 (six) hours as needed for pain.     [name] amLODipine (NORVASC) 5 MG tablet TAKE 1 TABLET BY MOUTH EVERY DAY 90 tablet 0  . apixaban (ELIQUIS) 5 MG TABS tablet Take 5 mg by mouth 2 (two) times daily.    . brimonidine (ALPHAGAN) 0.2 % ophthalmic solution Place 1 drop into both eyes 3 (three) times daily.     . Cholecalciferol (VITAMIN D3) 2000 units TABS Take 1 tablet by mouth daily.     . dorzolamide (TRUSOPT) 2 % ophthalmic solution Place 1 drop into both eyes 3 (three) times daily.     . furosemide (LASIX) 40 MG tablet TAKE 2 TABLETS BY MOUTH EVERY MORNING *NEED APPT* 180 tablet 0  . latanoprost (XALATAN) 0.005 % ophthalmic solution Apply 1 drop to eye at bedtime.     . meclizine (ANTIVERT) 25 MG tablet Take 1 tablet (25 mg total) by mouth 3 (three) times daily as needed for dizziness. 30 tablet 0  . Multiple Vitamin (MULTIVITAMIN) tablet Take 1 tablet by mouth daily.    . nitroGLYCERIN (NITROSTAT) 0.4 MG SL tablet PLACE 1 TABLET UNDER TONGUE EVERY FIVE MINUTES AS NEEDED FOR CHEST PAIN. 25 tablet 3  . omeprazole (PRILOSEC) 20 MG capsule Take 20 mg by mouth daily.    . potassium chloride SA (KLOR-CON M20) 20 MEQ tablet TAKE 1&1/2 TABLETS BY MOUTH EVERY DAY *NEED APPT* (Patient taking differently: Take 30  mEq by mouth daily. TAKE 1&1/2 TABLETS BY MOUTH EVERY DAY *NEED APPT*) 135 tablet 0  . vitamin E 400 UNIT capsule Take 400 Units by mouth daily.     No current facility-administered medications for this visit.    REVIEW OF SYSTEMS:  [X]  denotes positive finding, [ ]  denotes negative finding Cardiac  Comments:  Chest pain or chest pressure:    Shortness of breath upon exertion: x   Short of  breath when lying flat:    Irregular heart rhythm:        Vascular    Pain in calf, thigh, or hip brought on by ambulation:    Pain in feet at night that wakes you up from your sleep:     Blood clot in your veins:    Leg swelling:         Pulmonary    Oxygen at home:    Productive cough:     Wheezing:         Neurologic    Sudden weakness in arms or legs:     Sudden numbness in arms or legs:     Sudden onset of difficulty speaking or slurred speech:    Temporary loss of vision in one eye:     Problems with dizziness:         Gastrointestinal    Blood in stool:     Vomited blood:         Genitourinary    Burning when urinating:     Blood in urine:        Psychiatric    Major depression:         Hematologic    Bleeding problems:    Problems with blood clotting too easily:        Skin    Rashes or ulcers:        Constitutional    Fever or chills:     PHYSICAL EXAM:   Vitals:   06/26/20 1251 06/26/20 1254  BP: (!) 150/55 (!) 160/72  Pulse: 96   Resp: 20   Temp: 98 F (36.7 C)   SpO2: (!) 66%   Weight: 187 lb (84.8 kg)   Height: 5\' 8"  (1.727 m)     GENERAL: The patient is a well-nourished male, in no acute distress. The vital signs are documented above. CARDIAC: There is a regular rate and rhythm.  VASCULAR: I do not detect carotid bruits. He has palpable femoral pulses. I cannot palpate pedal pulses. PULMONARY: There is good air exchange bilaterally without wheezing or rales. ABDOMEN: Soft and non-tender with normal pitched bowel sounds.  MUSCULOSKELETAL: There  are no major deformities or cyanosis. NEUROLOGIC: No focal weakness or paresthesias are detected. SKIN: There are no ulcers or rashes noted. PSYCHIATRIC: The patient has a normal affect.  DATA:    ARTERIAL DOPPLER STUDY: I have independently interpreted his arterial Doppler study today.  On the right side there is a biphasic dorsalis pedis and posterior tibial signal.  ABIs 100%.  Toe pressures 104 mmHg.  On the left side there is a triphasic posterior tibial signal with a biphasic dorsalis pedis signal.  ABIs 100%.  Toe pressure is 96 mmHg.  CAROTID DUPLEX: I have independently interpreted the patient's carotid duplex scan.  On the right side there is a 60 to 79% proximal ICA stenosis.  The right vertebral artery is patent with antegrade flow.  On the left side there is a 60 to 79% ICA stenosis.  The left vertebral artery is patent with antegrade flow.  Deitra Mayo [facility] [phone]

## 2020-07-01 ENCOUNTER — Other Ambulatory Visit: Payer: Self-pay | Admitting: Interventional Cardiology

## 2020-07-01 ENCOUNTER — Other Ambulatory Visit: Payer: Self-pay

## 2020-07-01 ENCOUNTER — Ambulatory Visit (HOSPITAL_COMMUNITY): Payer: Medicare Other | Attending: Internal Medicine

## 2020-07-01 DIAGNOSIS — I25119 Atherosclerotic heart disease of native coronary artery with unspecified angina pectoris: Secondary | ICD-10-CM | POA: Diagnosis not present

## 2020-07-01 DIAGNOSIS — Z01810 Encounter for preprocedural cardiovascular examination: Secondary | ICD-10-CM | POA: Diagnosis not present

## 2020-07-01 DIAGNOSIS — Z01818 Encounter for other preprocedural examination: Secondary | ICD-10-CM | POA: Diagnosis not present

## 2020-07-01 DIAGNOSIS — I130 Hypertensive heart and chronic kidney disease with heart failure and stage 1 through stage 4 chronic kidney disease, or unspecified chronic kidney disease: Secondary | ICD-10-CM | POA: Insufficient documentation

## 2020-07-01 LAB — MYOCARDIAL PERFUSION IMAGING
LV dias vol: 103 mL (ref 62–150)
LV sys vol: 53 mL
Peak HR: 76 {beats}/min
Rest HR: 73 {beats}/min
SDS: 7
SRS: 13
SSS: 21
TID: 1.09

## 2020-07-01 MED ORDER — TECHNETIUM TC 99M TETROFOSMIN IV KIT
11.0000 | PACK | Freq: Once | INTRAVENOUS | Status: AC | PRN
  Administered 2020-07-01: 11 via INTRAVENOUS
  Filled 2020-07-01: qty 11

## 2020-07-01 MED ORDER — REGADENOSON 0.4 MG/5ML IV SOLN
0.4000 mg | Freq: Once | INTRAVENOUS | Status: AC
  Administered 2020-07-01: 0.4 mg via INTRAVENOUS

## 2020-07-01 MED ORDER — TECHNETIUM TC 99M TETROFOSMIN IV KIT
31.2000 | PACK | Freq: Once | INTRAVENOUS | Status: AC | PRN
  Administered 2020-07-01: 31.2 via INTRAVENOUS
  Filled 2020-07-01: qty 32

## 2020-07-02 MED ORDER — POTASSIUM CHLORIDE CRYS ER 20 MEQ PO TBCR: EXTENDED_RELEASE_TABLET | ORAL | 2 refills | Status: DC

## 2020-07-02 NOTE — Telephone Encounter (Signed)
   Primary Cardiologist: Sinclair Grooms, MD  Chart reviewed as part of pre-operative protocol coverage. Per Dr. [name] in review of the patients NST 07/01/20: "There is mild LV weakness and prior evidence of MI. No high risk features. Therefore, okay to proceed with surgery understanding there is mild to moderate elevation in risk.". Jonathan Warner would be at acceptable risk for the planned procedure without further cardiovascular testing.   Per pharmacy recommendations, patient can hold eliquis 3 day sprior to his upcoming knee replacement with plans to restart as soon as he is cleared to do so by his surgeon.  I will route this recommendation to the requesting party via Epic fax function and remove from pre-op pool.  Please call with questions.  Abigail Butts, PA-C 07/02/2020, 9:18 AM

## 2020-07-08 ENCOUNTER — Telehealth: Payer: Self-pay | Admitting: Interventional Cardiology

## 2020-07-08 NOTE — Telephone Encounter (Signed)
Kaitlyn with Dr. Josetta Huddle office would like to verify that the patient is no longer taking Spironolactone. Please return call to discuss.   Phone Number: [phone]

## 2020-07-08 NOTE — Telephone Encounter (Signed)
Spoke with Jonathan Warner and made her aware that pt's Spironolactone has been d/c'ed.  [facility] appreciative for call.

## 2020-07-10 ENCOUNTER — Telehealth: Payer: Self-pay | Admitting: Interventional Cardiology

## 2020-07-10 MED ORDER — POTASSIUM CHLORIDE CRYS ER 20 MEQ PO TBCR: EXTENDED_RELEASE_TABLET | ORAL | 2 refills | Status: DC

## 2020-07-10 MED ORDER — AMLODIPINE BESYLATE 5 MG PO TABS: 5.0000 mg | ORAL_TABLET | Freq: Every day | ORAL | 2 refills | Status: AC

## 2020-07-10 NOTE — Telephone Encounter (Signed)
Pt's medications were sent to pt's pharmacy as requested. Confirmation received.  

## 2020-07-10 NOTE — Telephone Encounter (Signed)
*  STAT* If patient is at the pharmacy, call can be transferred to refill team.   1. Which medications need to be refilled? (please list name of each medication and dose if known)  amLODipine (NORVASC) 5 MG tablet potassium chloride SA (KLOR-CON M20) 20 MEQ tablet 2. Which pharmacy/location (including street and city if local pharmacy) is medication to be sent to? [facility] [address], [place], [place] [address]  3. Do they need a 30 day or 90 day supply? 90 day supply

## 2020-07-10 NOTE — Telephone Encounter (Signed)
If swelling, not okay to have surgery. Would need to be seen again by me or team member.

## 2020-07-10 NOTE — Telephone Encounter (Signed)
I spoke with patient's wife. She is asking about knee surgery clearance.  I told her this had been sent on 11/9 to Dr Trevor Mace office. Wife reports patient continues to have swelling in feet and ankles.  Notes indentations in ankles when he takes compression socks off.  Legs feel tight. She thinks he may have fluid in his stomach but is not sure. His weight today is 186 lbs.  No other weights available. Does not weigh daily. He is taking lasix and potassium as listed. Spironolactone has been stopped.  Has shortness of breath but this has not worsened recently. Felt heart racing the other day but when he checked heart rate with pulse ox it was OK. I told patient's wife I would send message to Dr [name] regarding swelling.  Wife also expressing concerns about possible knee surgery and is asking if OK to have this surgery with leg swelling.

## 2020-07-10 NOTE — Telephone Encounter (Signed)
I placed call to patient and left message to call office

## 2020-07-11 NOTE — Telephone Encounter (Addendum)
Spoke with pt and offered appt on 11/24 but pt unable to come that day.  Scheduled pt to see Dr. [name] on 11/30 at 3pm.  Pt states his legs are quite tight but weight is stable.  Advised I will send message to Dr. [name] to see if he wants pt to take any extra Furosemide in the meantime while we wait for appt? Pt currently takes Furosemide 80mg  QD.

## 2020-07-16 NOTE — Telephone Encounter (Signed)
Spoke with pt and wife and made them aware of recommendations per Dr. [name].  Both verbalized understanding and were appreciative for call.

## 2020-07-17 DIAGNOSIS — H401113 Primary open-angle glaucoma, right eye, severe stage: Secondary | ICD-10-CM | POA: Diagnosis not present

## 2020-07-17 DIAGNOSIS — H401123 Primary open-angle glaucoma, left eye, severe stage: Secondary | ICD-10-CM | POA: Diagnosis not present

## 2020-07-21 NOTE — Progress Notes (Signed)
Cardiology Office Note:    Date:  07/23/2020   ID:  Jonathan Warner, DOB 06/20/1932, MRN [id number]  PCP:  Josetta Huddle, MD  Cardiologist:  Sinclair Grooms, MD   Referring MD: Josetta Huddle, MD   Chief Complaint  Patient presents with  . Coronary Artery Disease  . Congestive Heart Failure  . Atrial Fibrillation    History of Present Illness:    Jonathan Warner is a 86 y.o. 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 Now complaing of swelling in legs and also wonders if could have safe orthopedic surgery.  Sigurd is concerned about bilateral lower extremity swelling, weakness in his legs, and arthritis in right greater than left knee which causes pain and prevents ambulating.  He is particularly concerned about the lower extremity edema because he says it causes his legs to feel heavy.  I have previously tried to manage this with diuretic therapy and diet which has not been successful.  He has 1-2+ edema from his ankles to the knee.  There is pitting.  He has had bilateral lower extremity vein stripping/harvesting for coronary bypass surgery in the past.  Each morning when he awakens, there is significant reduction and lower extremity swelling but it returns.  I tried adding Aldactone to his 80 mg furosemide dose but this caused significant side effects.  I have recommended support stockings but he is unable to comply due to difficulty getting the stockings on his feet.  Past Medical History:  Diagnosis Date  . Arthritis   . BPH (benign prostatic hyperplasia)   . CAD (coronary artery disease)    s/p CABG 1991  . Carotid artery occlusion   . Cataract    b/l surgery  . Chronic kidney disease (CKD), stage III (moderate) (HCC)   . Diverticulosis   . DJD (degenerative joint disease)   . DJD (degenerative  joint disease)   . GERD (gastroesophageal reflux disease)   . Glaucoma   . Hiatal hernia   . History of adenomatous polyp of colon 06/07/13   Last colonoscopy 06/27/13 showed small adenoma, no further testing due to advanced age.  [name] History of radiation therapy 07/25/2012-09/20/2012   prostate 7800 cGy 40 sessions, seminal vesicles 5600 cGy 40 sessions  . History of shingles   . Hypercholesteremia   . Hyperlipidemia   . Hypertension   . Inguinal hernia   . Osteomyelitis of forearm, right, acute ([place])    drainage x 2  . Persistent atrial fibrillation ([place])   . Prostate cancer ([place]) 04/27/12   Dr. Eulogio Ditch. Radiation therapy. Adenocarcinoma,gleason=3+4=7, 3+3=6,PSA=4.93  volume=51cc  . Pulmonary nodules    small rlung base nodule 4.61mm, scarring in lung bases  . 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 CARDIAC CATHETERIZATION  06/04/09   left w/noted patent bypass grafts  . CATARACT EXTRACTION W/ INTRAOCULAR LENS IMPLANT  2011  . COLONOSCOPY  12/21/2007   hx polyps, diverticulosis  . COLONOSCOPY WITH PROPOFOL N/A 06/27/2013   Procedure: COLONOSCOPY WITH PROPOFOL;  Surgeon: Garlan Fair, MD;  Location: [facility];  Service: Endoscopy;  Laterality: N/A;  . CORONARY ARTERY BYPASS GRAFT  1991   x5; prior MI  . PACEMAKER GENERATOR CHANGE  05/24/12  . PACEMAKER INSERTION  05/14/03   MDT EnPulse implanted by Dr Leonia Reeves ddd  . PERMANENT PACEMAKER GENERATOR CHANGE N/A 05/24/2012   Procedure: PERMANENT PACEMAKER GENERATOR CHANGE;  Surgeon: Thompson Grayer, MD;  Location: [facility] CATH LAB;  Service: Cardiovascular;  Laterality: N/A;  . PROSTATE BIOPSY  04/27/12   Adenocarcinoma,gleason=3+3=6, 3+4,& 4+3,PSA=4.93,volume=51cc  . TUMOR REMOVAL     skin tumors removed    Current Medications: Current Meds  Medication Sig  . acetaminophen (TYLENOL) 500 MG tablet Take 1,000  mg by mouth every 6 (six) hours as needed for pain.   [name] amLODipine (NORVASC) 5 MG tablet Take 1 tablet (5 mg total) by mouth daily.  [name] apixaban (ELIQUIS) 5 MG TABS tablet Take 5 mg by mouth 2 (two) times daily.  . brimonidine (ALPHAGAN) 0.2 % ophthalmic solution Place 1 drop into both eyes 3 (three) times daily.   . Cholecalciferol (VITAMIN D3) 2000 units TABS Take 1 tablet by mouth daily.   . dorzolamide (TRUSOPT) 2 % ophthalmic solution Place 1 drop into both eyes 3 (three) times daily.   . furosemide (LASIX) 40 MG tablet Take 2 tablets by mouth every morning.  Take one tablet by mouth every evening.  . latanoprost (XALATAN) 0.005 % ophthalmic solution Apply 1 drop to eye at bedtime.   . meclizine (ANTIVERT) 25 MG tablet Take 1 tablet (25 mg total) by mouth 3 (three) times daily as needed for dizziness.  . Multiple Vitamin (MULTIVITAMIN) tablet Take 1 tablet by mouth daily.  . nitroGLYCERIN (NITROSTAT) 0.4 MG SL tablet PLACE 1 TABLET UNDER TONGUE EVERY FIVE MINUTES AS NEEDED FOR CHEST PAIN.  [name] omeprazole (PRILOSEC) 20 MG capsule Take 20 mg by mouth daily.  . potassium chloride SA (KLOR-CON M20) 20 MEQ tablet TAKE 1&1/2 TABLETS BY MOUTH EVERY DAY  . vitamin E 400 UNIT capsule Take 400 Units by mouth daily.  . [DISCONTINUED] furosemide (LASIX) 40 MG tablet TAKE 2 TABLETS BY MOUTH EVERY MORNING *NEED APPT*     Allergies:   Pilocarpine and Rosuvastatin   Social History   Socioeconomic History  . Marital status: Married    Spouse name: Not on file  . Number of children: 1  . Years of education: Not on file  . Highest education level: Not on file  Occupational History  . Occupation: Retired    Comment: Theatre stage manager units  Tobacco Use  . Smoking status: Former Smoker    Packs/day: 0.50    Years: 10.00    Pack years: 5.00    Types: Cigarettes    Quit date: 08/24/1981    Years since quitting: 38.9  . Smokeless tobacco: Never Used  Vaping Use  . Vaping Use: Never  used  Substance and Sexual Activity  . Alcohol use: No  . Drug use: No  . Sexual activity: Not on file  Other Topics Concern  . Not on file  Social History Narrative   Retired Hotel manager.  Lives in [place].   Social Determinants of Health   Financial Resource Strain:   . Difficulty of Paying Living Expenses: Not on file  Food Insecurity:   . Worried About Charity fundraiser in the Last Year: Not on file  . Ran Out of Food in the Last Year: Not on file  Transportation Needs:   . Lack of Transportation (Medical): Not on file  . Lack of Transportation (Non-Medical): Not on file  Physical Activity:   . Days of Exercise per Week: Not  on file  . Minutes of Exercise per Session: Not on file  Stress:   . Feeling of Stress : Not on file  Social Connections:   . Frequency of Communication with Friends and Family: Not on file  . Frequency of Social Gatherings with Friends and Family: Not on file  . Attends Religious Services: Not on file  . Active Member of Clubs or Organizations: Not on file  . Attends Archivist Meetings: Not on file  . Marital Status: Not on file     Family History: The patient's family history includes Alzheimer's disease in his father; CAD in his brother and mother; CVA in his brother; Congestive Heart Failure in his mother; Heart attack in his brother; Hypertension in his mother and sister; Pancreatic cancer in his brother; Stroke in his brother.  ROS:   Please see the history of present illness.    He and his wife are concerned about whether he should pursue knee surgery.  He has pain and understands that surgery would help with the pain.  Surgery would not help with the swelling in his bilateral lower extremities.  He also has significant deconditioning and became short of breath with coming in from the parking lot.  He denies orthopnea and PND.  All other systems reviewed and are negative.  EKGs/Labs/Other Studies Reviewed:    The following  studies were reviewed today: No new imaging data  EKG:  EKG not repeated.  Last performed May 17, 2020 atrial fibrillation, overall ventricular rate less than 80 bpm, right bundle branch block with left axis deviation.  Recent Labs: 01/30/2020: NT-Pro BNP 2,125 05/16/2020: ALT 15; BUN 22; Creatinine, Ser 1.25; Hemoglobin 13.2; Platelets 169; Potassium 3.7; Sodium 135  Recent Lipid Panel    Component Value Date/Time   CHOL 161 06/14/2013 0955   TRIG 250.0 (H) 06/14/2013 0955   HDL 34.70 (L) 06/14/2013 0955   CHOLHDL 5 06/14/2013 0955   VLDL 50.0 (H) 06/14/2013 0955   LDLDIRECT 83.5 06/14/2013 0955    Physical Exam:    VS:  BP 128/60   Pulse 79   Ht 5\' 8"  (1.727 m)   Wt 186 lb (84.4 kg)   SpO2 97%   BMI 28.28 kg/m     Wt Readings from Last 3 Encounters:  07/23/20 186 lb (84.4 kg)  07/01/20 184 lb (83.5 kg)  06/26/20 187 lb (84.8 kg)     GEN: Elderly but not frail. No acute distress HEENT: Normal NECK: CV wave is noted bilateral jugular veins with the patient lying at 40 degrees.   LYMPHATICS: No lymphadenopathy CARDIAC:  IIRR without murmur, gallop, or edema. VASCULAR:  Normal Pulses. No bruits. RESPIRATORY:  Clear to auscultation without rales, wheezing or rhonchi  ABDOMEN: Soft, non-tender, non-distended, No pulsatile mass, MUSCULOSKELETAL: No deformity  SKIN: Warm and dry NEUROLOGIC:  Alert and oriented x 3 PSYCHIATRIC:  Normal affect   ASSESSMENT:    1. Coronary artery disease involving native coronary artery of native heart with angina pectoris ([place])   2. Tachy-brady syndrome ([place])   3. Acute on chronic diastolic HF (heart failure) ([place])   4. Chronic anticoagulation   5. Atrial fibrillation, unspecified type ([place])   6. Educated about COVID-19 virus infection   7. Preoperative clearance    PLAN:    In order of problems listed above:  1. Secondary prevention with blood pressure and lipid management briefly discussed.  He should continue as needed  nitroglycerin.  He would not use statins  because of intolerance.  He is on Norvasc and furosemide for blood pressure control. 2. Currently controlled. 3. Perhaps mild volume overload with peripheral edema being multifactorial.  Continue 80 mg furosemide a.m. and 40 mg p.m.  BMET in 1 week.  Call if low systolic blood pressures below 110 mmHg.  Call if lightheadedness or dizziness.  I again discussed the importance of compression stockings and leg elevation to optimize reduction in lower extremity edema. 4. Continue Eliquis 5 mg twice daily 5. Rate control is adequate. 6. He is vaccinated and practicing social mitigation. 7. I have dissuaded knee replacement surgery at his age, with his current level of deconditioning and uncertain underlying vascular state.  His wife seems to agree.  The patient is excepting.   Medication Adjustments/Labs and Tests Ordered: Current medicines are reviewed at length with the patient today.  Concerns regarding medicines are outlined above.  Orders Placed This Encounter  Procedures  . Basic metabolic panel   Meds ordered this encounter  Medications  . furosemide (LASIX) 40 MG tablet    Sig: Take 2 tablets by mouth every morning.  Take one tablet by mouth every evening.    Dispense:  270 tablet    Refill:  3    Dose change    Patient Instructions  Medication Instructions:  1) INCREASE Furosemide to 80mg  in the morning and 40mg  in the evening.  *If you need a refill on your cardiac medications before your next appointment, please call your pharmacy*   Lab Work: BMET in 1 week  If you have labs (blood work) drawn today and your tests are completely normal, you will receive your results only by: [name] MyChart Message (if you have MyChart) OR . A paper copy in the mail If you have any lab test that is abnormal or we need to change your treatment, we will call you to review the results.   Testing/Procedures: None   Follow-Up: At [facility], you and  your health needs are our priority.  As part of our continuing mission to provide you with exceptional heart care, we have created designated Provider Care Teams.  These Care Teams include your primary Cardiologist (physician) and Advanced Practice Providers (APPs -  Physician Assistants and Nurse Practitioners) who all work together to provide you with the care you need, when you need it.  We recommend signing up for the patient portal called "MyChart".  Sign up information is provided on this After Visit Summary.  MyChart is used to connect with patients for Virtual Visits (Telemedicine).  Patients are able to view lab/test results, encounter notes, upcoming appointments, etc.  Non-urgent messages can be sent to your provider as well.   To learn more about what you can do with MyChart, go to [email or web address].    Your next appointment:   3 month(s)  The format for your next appointment:   In Person  Provider:   You may see Sinclair Grooms, MD or one of the following Advanced Practice Providers on your designated Care Team:    Truitt Merle, NP  Cecilie Kicks, NP  Kathyrn Drown, NP    Other Instructions      Signed, Sinclair Grooms, MD  07/23/2020 4:17 PM    [place]

## 2020-07-22 DIAGNOSIS — M159 Polyosteoarthritis, unspecified: Secondary | ICD-10-CM | POA: Diagnosis not present

## 2020-07-22 DIAGNOSIS — I5030 Unspecified diastolic (congestive) heart failure: Secondary | ICD-10-CM | POA: Diagnosis not present

## 2020-07-22 DIAGNOSIS — H409 Unspecified glaucoma: Secondary | ICD-10-CM | POA: Diagnosis not present

## 2020-07-22 DIAGNOSIS — I2510 Atherosclerotic heart disease of native coronary artery without angina pectoris: Secondary | ICD-10-CM | POA: Diagnosis not present

## 2020-07-22 DIAGNOSIS — I480 Paroxysmal atrial fibrillation: Secondary | ICD-10-CM | POA: Diagnosis not present

## 2020-07-22 DIAGNOSIS — C61 Malignant neoplasm of prostate: Secondary | ICD-10-CM | POA: Diagnosis not present

## 2020-07-22 DIAGNOSIS — I252 Old myocardial infarction: Secondary | ICD-10-CM | POA: Diagnosis not present

## 2020-07-22 DIAGNOSIS — I10 Essential (primary) hypertension: Secondary | ICD-10-CM | POA: Diagnosis not present

## 2020-07-22 DIAGNOSIS — M1711 Unilateral primary osteoarthritis, right knee: Secondary | ICD-10-CM | POA: Diagnosis not present

## 2020-07-22 DIAGNOSIS — E785 Hyperlipidemia, unspecified: Secondary | ICD-10-CM | POA: Diagnosis not present

## 2020-07-22 DIAGNOSIS — N183 Chronic kidney disease, stage 3 unspecified: Secondary | ICD-10-CM | POA: Diagnosis not present

## 2020-07-22 DIAGNOSIS — M179 Osteoarthritis of knee, unspecified: Secondary | ICD-10-CM | POA: Diagnosis not present

## 2020-07-23 ENCOUNTER — Encounter: Payer: Self-pay | Admitting: Interventional Cardiology

## 2020-07-23 ENCOUNTER — Other Ambulatory Visit: Payer: Self-pay

## 2020-07-23 ENCOUNTER — Encounter: Payer: Self-pay | Admitting: *Deleted

## 2020-07-23 ENCOUNTER — Ambulatory Visit (INDEPENDENT_AMBULATORY_CARE_PROVIDER_SITE_OTHER): Payer: Medicare Other | Admitting: Interventional Cardiology

## 2020-07-23 VITALS — BP 128/60 | HR 79 | Ht 68.0 in | Wt 186.0 lb

## 2020-07-23 DIAGNOSIS — I25119 Atherosclerotic heart disease of native coronary artery with unspecified angina pectoris: Secondary | ICD-10-CM | POA: Diagnosis not present

## 2020-07-23 DIAGNOSIS — I5033 Acute on chronic diastolic (congestive) heart failure: Secondary | ICD-10-CM

## 2020-07-23 DIAGNOSIS — I6523 Occlusion and stenosis of bilateral carotid arteries: Secondary | ICD-10-CM | POA: Diagnosis not present

## 2020-07-23 DIAGNOSIS — I4891 Unspecified atrial fibrillation: Secondary | ICD-10-CM | POA: Diagnosis not present

## 2020-07-23 DIAGNOSIS — Z7189 Other specified counseling: Secondary | ICD-10-CM | POA: Diagnosis not present

## 2020-07-23 DIAGNOSIS — Z01818 Encounter for other preprocedural examination: Secondary | ICD-10-CM

## 2020-07-23 DIAGNOSIS — I495 Sick sinus syndrome: Secondary | ICD-10-CM

## 2020-07-23 DIAGNOSIS — Z7901 Long term (current) use of anticoagulants: Secondary | ICD-10-CM

## 2020-07-23 MED ORDER — FUROSEMIDE 40 MG PO TABS: ORAL_TABLET | ORAL | 3 refills | Status: DC

## 2020-07-23 NOTE — Patient Instructions (Signed)
Medication Instructions:  1) INCREASE Furosemide to 80mg  in the morning and 40mg  in the evening.  *If you need a refill on your cardiac medications before your next appointment, please call your pharmacy*   Lab Work: BMET in 1 week  If you have labs (blood work) drawn today and your tests are completely normal, you will receive your results only by:  [place] (if you have MyChart) OR  A paper copy in the mail If you have any lab test that is abnormal or we need to change your treatment, we will call you to review the results.   Testing/Procedures: None   Follow-Up: At [facility], you and your health needs are our priority.  As part of our continuing mission to provide you with exceptional heart care, we have created designated Provider Care Teams.  These Care Teams include your primary Cardiologist (physician) and Advanced Practice Providers (APPs -  Physician Assistants and Nurse Practitioners) who all work together to provide you with the care you need, when you need it.  We recommend signing up for the patient portal called "MyChart".  Sign up information is provided on this After Visit Summary.  MyChart is used to connect with patients for Virtual Visits (Telemedicine).  Patients are able to view lab/test results, encounter notes, upcoming appointments, etc.  Non-urgent messages can be sent to your provider as well.   To learn more about what you can do with MyChart, go to [email or web address].    Your next appointment:   3 month(s)  The format for your next appointment:   In Person  Provider:   You may see Sinclair Grooms, MD or one of the following Advanced Practice Providers on your designated Care Team:    Truitt Merle, NP  Cecilie Kicks, NP  Kathyrn Drown, NP    Other Instructions

## 2020-07-25 ENCOUNTER — Ambulatory Visit (INDEPENDENT_AMBULATORY_CARE_PROVIDER_SITE_OTHER): Payer: Medicare Other

## 2020-07-25 DIAGNOSIS — I495 Sick sinus syndrome: Secondary | ICD-10-CM | POA: Diagnosis not present

## 2020-07-25 LAB — CUP PACEART REMOTE DEVICE CHECK
Battery Impedance: 942 Ohm
Battery Remaining Longevity: 77 mo
Battery Voltage: 2.77 V
Brady Statistic RV Percent Paced: 25 %
Date Time Interrogation Session: 20211202110310
Implantable Lead Implant Date: 20040920
Implantable Lead Implant Date: 20040920
Implantable Lead Location: 753859
Implantable Lead Location: 753860
Implantable Lead Model: 5076
Implantable Lead Model: 5092
Implantable Pulse Generator Implant Date: 20131001
Lead Channel Impedance Value: 672 Ohm
Lead Channel Pacing Threshold Amplitude: 0.75 V
Lead Channel Pacing Threshold Pulse Width: 0.4 ms
Lead Channel Setting Pacing Amplitude: 2.5 V
Lead Channel Setting Pacing Pulse Width: 0.4 ms
Lead Channel Setting Sensing Sensitivity: 4 mV

## 2020-07-30 DIAGNOSIS — Z961 Presence of intraocular lens: Secondary | ICD-10-CM | POA: Diagnosis not present

## 2020-07-30 DIAGNOSIS — H401133 Primary open-angle glaucoma, bilateral, severe stage: Secondary | ICD-10-CM | POA: Diagnosis not present

## 2020-07-30 DIAGNOSIS — H524 Presbyopia: Secondary | ICD-10-CM | POA: Diagnosis not present

## 2020-07-30 DIAGNOSIS — H5213 Myopia, bilateral: Secondary | ICD-10-CM | POA: Diagnosis not present

## 2020-07-31 ENCOUNTER — Other Ambulatory Visit: Payer: Medicare Other | Admitting: *Deleted

## 2020-07-31 ENCOUNTER — Other Ambulatory Visit: Payer: Self-pay

## 2020-07-31 DIAGNOSIS — I5033 Acute on chronic diastolic (congestive) heart failure: Secondary | ICD-10-CM

## 2020-08-01 ENCOUNTER — Other Ambulatory Visit: Payer: Self-pay | Admitting: *Deleted

## 2020-08-01 DIAGNOSIS — E876 Hypokalemia: Secondary | ICD-10-CM

## 2020-08-01 LAB — BASIC METABOLIC PANEL
BUN/Creatinine Ratio: 11 (ref 10–24)
BUN: 14 mg/dL (ref 8–27)
CO2: 28 mmol/L (ref 20–29)
Calcium: 9.2 mg/dL (ref 8.6–10.2)
Chloride: 99 mmol/L (ref 96–106)
Creatinine, Ser: 1.25 mg/dL (ref 0.76–1.27)
GFR calc Af Amer: 60 mL/min/{1.73_m2} (ref >59)
GFR calc non Af Amer: 52 mL/min/{1.73_m2} — ABNORMAL LOW (ref >59)
Glucose: 110 mg/dL — ABNORMAL HIGH (ref 65–99)
Potassium: 3.2 mmol/L — ABNORMAL LOW (ref 3.5–5.2)
Sodium: 141 mmol/L (ref 134–144)

## 2020-08-01 MED ORDER — POTASSIUM CHLORIDE CRYS ER 20 MEQ PO TBCR
20.0000 meq | EXTENDED_RELEASE_TABLET | Freq: Two times a day (BID) | ORAL | 3 refills | Status: DC
Start: 2020-08-01 — End: 2020-08-02

## 2020-08-02 ENCOUNTER — Other Ambulatory Visit: Payer: Self-pay | Admitting: *Deleted

## 2020-08-02 ENCOUNTER — Telehealth: Payer: Self-pay | Admitting: Interventional Cardiology

## 2020-08-02 DIAGNOSIS — M159 Polyosteoarthritis, unspecified: Secondary | ICD-10-CM | POA: Diagnosis not present

## 2020-08-02 DIAGNOSIS — C61 Malignant neoplasm of prostate: Secondary | ICD-10-CM | POA: Diagnosis not present

## 2020-08-02 DIAGNOSIS — I2510 Atherosclerotic heart disease of native coronary artery without angina pectoris: Secondary | ICD-10-CM | POA: Diagnosis not present

## 2020-08-02 DIAGNOSIS — I480 Paroxysmal atrial fibrillation: Secondary | ICD-10-CM | POA: Diagnosis not present

## 2020-08-02 DIAGNOSIS — N183 Chronic kidney disease, stage 3 unspecified: Secondary | ICD-10-CM | POA: Diagnosis not present

## 2020-08-02 DIAGNOSIS — H409 Unspecified glaucoma: Secondary | ICD-10-CM | POA: Diagnosis not present

## 2020-08-02 DIAGNOSIS — E785 Hyperlipidemia, unspecified: Secondary | ICD-10-CM | POA: Diagnosis not present

## 2020-08-02 DIAGNOSIS — I5033 Acute on chronic diastolic (congestive) heart failure: Secondary | ICD-10-CM | POA: Diagnosis not present

## 2020-08-02 DIAGNOSIS — I10 Essential (primary) hypertension: Secondary | ICD-10-CM | POA: Diagnosis not present

## 2020-08-02 DIAGNOSIS — I252 Old myocardial infarction: Secondary | ICD-10-CM | POA: Diagnosis not present

## 2020-08-02 DIAGNOSIS — I5030 Unspecified diastolic (congestive) heart failure: Secondary | ICD-10-CM | POA: Diagnosis not present

## 2020-08-02 MED ORDER — POTASSIUM CHLORIDE CRYS ER 20 MEQ PO TBCR: 20.0000 meq | EXTENDED_RELEASE_TABLET | Freq: Two times a day (BID) | ORAL | 3 refills | Status: AC

## 2020-08-02 NOTE — Progress Notes (Signed)
Remote pacemaker transmission.   

## 2020-08-02 NOTE — Telephone Encounter (Signed)
Left detailed message on Kaitlyn's VM letting her know what the instructions were on pt's potassium.  Advised to call back if any questions.

## 2020-08-02 NOTE — Telephone Encounter (Signed)
New message:      Katlyn from Dr. Inda Merlin office calling to see how the patient takes potassium

## 2020-08-05 DIAGNOSIS — M179 Osteoarthritis of knee, unspecified: Secondary | ICD-10-CM | POA: Diagnosis not present

## 2020-08-05 DIAGNOSIS — M1711 Unilateral primary osteoarthritis, right knee: Secondary | ICD-10-CM | POA: Diagnosis not present

## 2020-08-09 ENCOUNTER — Other Ambulatory Visit: Payer: Self-pay

## 2020-08-09 ENCOUNTER — Other Ambulatory Visit: Payer: Medicare Other | Admitting: *Deleted

## 2020-08-09 DIAGNOSIS — E876 Hypokalemia: Secondary | ICD-10-CM

## 2020-08-10 LAB — BASIC METABOLIC PANEL
BUN/Creatinine Ratio: 27 — ABNORMAL HIGH (ref 10–24)
BUN: 33 mg/dL — ABNORMAL HIGH (ref 8–27)
CO2: 24 mmol/L (ref 20–29)
Calcium: 8.8 mg/dL (ref 8.6–10.2)
Chloride: 98 mmol/L (ref 96–106)
Creatinine, Ser: 1.21 mg/dL (ref 0.76–1.27)
GFR calc Af Amer: 62 mL/min/{1.73_m2} (ref >59)
GFR calc non Af Amer: 54 mL/min/{1.73_m2} — ABNORMAL LOW (ref >59)
Glucose: 120 mg/dL — ABNORMAL HIGH (ref 65–99)
Potassium: 3.5 mmol/L (ref 3.5–5.2)
Sodium: 139 mmol/L (ref 134–144)

## 2020-08-30 DIAGNOSIS — N400 Enlarged prostate without lower urinary tract symptoms: Secondary | ICD-10-CM | POA: Diagnosis not present

## 2020-08-30 DIAGNOSIS — H409 Unspecified glaucoma: Secondary | ICD-10-CM | POA: Diagnosis not present

## 2020-08-30 DIAGNOSIS — E785 Hyperlipidemia, unspecified: Secondary | ICD-10-CM | POA: Diagnosis not present

## 2020-08-30 DIAGNOSIS — I2510 Atherosclerotic heart disease of native coronary artery without angina pectoris: Secondary | ICD-10-CM | POA: Diagnosis not present

## 2020-08-30 DIAGNOSIS — M159 Polyosteoarthritis, unspecified: Secondary | ICD-10-CM | POA: Diagnosis not present

## 2020-08-30 DIAGNOSIS — I480 Paroxysmal atrial fibrillation: Secondary | ICD-10-CM | POA: Diagnosis not present

## 2020-08-30 DIAGNOSIS — I5033 Acute on chronic diastolic (congestive) heart failure: Secondary | ICD-10-CM | POA: Diagnosis not present

## 2020-08-30 DIAGNOSIS — I10 Essential (primary) hypertension: Secondary | ICD-10-CM | POA: Diagnosis not present

## 2020-08-30 DIAGNOSIS — N183 Chronic kidney disease, stage 3 unspecified: Secondary | ICD-10-CM | POA: Diagnosis not present

## 2020-08-30 DIAGNOSIS — M179 Osteoarthritis of knee, unspecified: Secondary | ICD-10-CM | POA: Diagnosis not present

## 2020-08-30 DIAGNOSIS — I252 Old myocardial infarction: Secondary | ICD-10-CM | POA: Diagnosis not present

## 2020-08-30 DIAGNOSIS — C61 Malignant neoplasm of prostate: Secondary | ICD-10-CM | POA: Diagnosis not present

## 2020-09-04 ENCOUNTER — Other Ambulatory Visit: Payer: Self-pay | Admitting: Interventional Cardiology

## 2020-09-04 MED ORDER — FUROSEMIDE 40 MG PO TABS: ORAL_TABLET | ORAL | 3 refills | Status: AC

## 2020-09-23 DIAGNOSIS — N183 Chronic kidney disease, stage 3 unspecified: Secondary | ICD-10-CM | POA: Diagnosis not present

## 2020-09-23 DIAGNOSIS — R1030 Lower abdominal pain, unspecified: Secondary | ICD-10-CM | POA: Diagnosis not present

## 2020-09-23 DIAGNOSIS — I2510 Atherosclerotic heart disease of native coronary artery without angina pectoris: Secondary | ICD-10-CM | POA: Diagnosis not present

## 2020-09-23 DIAGNOSIS — I252 Old myocardial infarction: Secondary | ICD-10-CM | POA: Diagnosis not present

## 2020-09-23 DIAGNOSIS — C61 Malignant neoplasm of prostate: Secondary | ICD-10-CM | POA: Diagnosis not present

## 2020-09-23 DIAGNOSIS — M545 Low back pain, unspecified: Secondary | ICD-10-CM | POA: Diagnosis not present

## 2020-09-23 DIAGNOSIS — D6869 Other thrombophilia: Secondary | ICD-10-CM | POA: Diagnosis not present

## 2020-09-23 DIAGNOSIS — S8392XA Sprain of unspecified site of left knee, initial encounter: Secondary | ICD-10-CM | POA: Diagnosis not present

## 2020-09-23 DIAGNOSIS — I5030 Unspecified diastolic (congestive) heart failure: Secondary | ICD-10-CM | POA: Diagnosis not present

## 2020-09-23 DIAGNOSIS — I480 Paroxysmal atrial fibrillation: Secondary | ICD-10-CM | POA: Diagnosis not present

## 2020-09-26 ENCOUNTER — Other Ambulatory Visit: Payer: Self-pay | Admitting: *Deleted

## 2020-09-26 DIAGNOSIS — R52 Pain, unspecified: Secondary | ICD-10-CM

## 2020-09-26 DIAGNOSIS — R627 Adult failure to thrive: Secondary | ICD-10-CM

## 2020-09-27 ENCOUNTER — Telehealth: Payer: Self-pay

## 2020-09-27 DIAGNOSIS — I2510 Atherosclerotic heart disease of native coronary artery without angina pectoris: Secondary | ICD-10-CM | POA: Diagnosis not present

## 2020-09-27 DIAGNOSIS — I252 Old myocardial infarction: Secondary | ICD-10-CM | POA: Diagnosis not present

## 2020-09-27 DIAGNOSIS — I5030 Unspecified diastolic (congestive) heart failure: Secondary | ICD-10-CM | POA: Diagnosis not present

## 2020-09-27 DIAGNOSIS — N50811 Right testicular pain: Secondary | ICD-10-CM | POA: Diagnosis not present

## 2020-09-27 DIAGNOSIS — M159 Polyosteoarthritis, unspecified: Secondary | ICD-10-CM | POA: Diagnosis not present

## 2020-09-27 DIAGNOSIS — I480 Paroxysmal atrial fibrillation: Secondary | ICD-10-CM | POA: Diagnosis not present

## 2020-09-27 DIAGNOSIS — I10 Essential (primary) hypertension: Secondary | ICD-10-CM | POA: Diagnosis not present

## 2020-09-27 DIAGNOSIS — N183 Chronic kidney disease, stage 3 unspecified: Secondary | ICD-10-CM | POA: Diagnosis not present

## 2020-09-27 DIAGNOSIS — M1711 Unilateral primary osteoarthritis, right knee: Secondary | ICD-10-CM | POA: Diagnosis not present

## 2020-09-27 DIAGNOSIS — E785 Hyperlipidemia, unspecified: Secondary | ICD-10-CM | POA: Diagnosis not present

## 2020-09-27 DIAGNOSIS — C61 Malignant neoplasm of prostate: Secondary | ICD-10-CM | POA: Diagnosis not present

## 2020-09-27 DIAGNOSIS — I5033 Acute on chronic diastolic (congestive) heart failure: Secondary | ICD-10-CM | POA: Diagnosis not present

## 2020-09-27 NOTE — Telephone Encounter (Signed)
Spoke with patient's wife Jonathan Warner and scheduled an in-person Palliative Consult for 10/04/20 @ 11AM  COVID screening was negative. No pets in home. Patient lives with wife. Son will also be at the consult.  Consent obtained; updated Outlook/Netsmart/Team List and Epic.  Family is aware they will be receiving a call from NP the day before or day of to confirm appointment.

## 2020-10-01 ENCOUNTER — Ambulatory Visit
Admission: RE | Admit: 2020-10-01 | Discharge: 2020-10-01 | Disposition: A | Discharge status: Home / Self Care | Payer: Medicare Other | Source: Ambulatory Visit | Attending: Internal Medicine | Admitting: Internal Medicine

## 2020-10-01 ENCOUNTER — Other Ambulatory Visit: Payer: Self-pay | Admitting: Internal Medicine

## 2020-10-01 DIAGNOSIS — M25851 Other specified joint disorders, right hip: Secondary | ICD-10-CM | POA: Diagnosis not present

## 2020-10-01 DIAGNOSIS — M533 Sacrococcygeal disorders, not elsewhere classified: Secondary | ICD-10-CM | POA: Diagnosis not present

## 2020-10-01 DIAGNOSIS — R1031 Right lower quadrant pain: Secondary | ICD-10-CM

## 2020-10-01 DIAGNOSIS — M159 Polyosteoarthritis, unspecified: Secondary | ICD-10-CM | POA: Diagnosis not present

## 2020-10-01 DIAGNOSIS — R1030 Lower abdominal pain, unspecified: Secondary | ICD-10-CM | POA: Diagnosis not present

## 2020-10-01 DIAGNOSIS — M1611 Unilateral primary osteoarthritis, right hip: Secondary | ICD-10-CM | POA: Diagnosis not present

## 2020-10-02 ENCOUNTER — Ambulatory Visit (INDEPENDENT_AMBULATORY_CARE_PROVIDER_SITE_OTHER): Payer: Medicare Other | Admitting: Orthopaedic Surgery

## 2020-10-02 ENCOUNTER — Encounter: Payer: Self-pay | Admitting: Orthopaedic Surgery

## 2020-10-02 ENCOUNTER — Ambulatory Visit (INDEPENDENT_AMBULATORY_CARE_PROVIDER_SITE_OTHER): Payer: Medicare Other

## 2020-10-02 VITALS — Ht 68.0 in | Wt 186.0 lb

## 2020-10-02 DIAGNOSIS — M25551 Pain in right hip: Secondary | ICD-10-CM

## 2020-10-02 MED ORDER — TRAMADOL HCL 50 MG PO TABS
50.0000 mg | ORAL_TABLET | Freq: Four times a day (QID) | ORAL | 0 refills | Status: DC | PRN
Start: 2020-10-02 — End: 2020-10-11

## 2020-10-02 NOTE — Progress Notes (Signed)
Office Visit Note   Patient: Jonathan Warner           Date of Birth: [birth year]           MRN: [id number] Visit Date: 10/02/2020              Requested by: Josetta Huddle, MD [address] at [place],  [place] [address] PCP: Josetta Huddle, MD   Assessment & Plan: Visit Diagnoses:  1. Pain in right hip     Plan: Based on my clinical exam findings I do not feel this is a hip issue at all.  Also looking at his plain films of the lumbar spine this may be likely from a subacute injury to the L2 vertebral body which I feel has a compression component.  This is something we should just watch with time.  I would like to reevaluate him with an exam in 3 weeks and a repeat lateral view lumbar spine.  We will send in some more tramadol for him as well.  Follow-Up Instructions: Return in about 3 weeks (around 10/23/2020).   Orders:  Orders Placed This Encounter  Procedures  . XR Lumbar Spine 2-3 Views   Meds ordered this encounter  Medications  . traMADol (ULTRAM) 50 MG tablet    Sig: Take 1 tablet (50 mg total) by mouth every 6 (six) hours as needed.    Dispense:  30 tablet    Refill:  0      Procedures: No procedures performed   Clinical Data: No additional findings.   Subjective: Chief Complaint  Patient presents with  . Right Hip - Pain    Fall 08/16/2020  The patient comes in today with right groin pain.  He says he feels like it hurts in his testicle area.  He has been seen by his primary care physician and by the urology department and they did not feel it is an issue as far as the hernia or his testicle.  He has had x-rays of his hip that showed some mild arthritis of his hip.  He fell down onto his knees and hands back about 6 weeks ago at the end of December.  Recently he has not been able to even get out of bed because the source of his pain.  He points to his low back to the right side also as a source of pain.  He is on Eliquis so he can take blood thinners.  He  has been on a steroid taper and this did not help with his pain.  He has been taking some tramadol but only half a tablet twice a day.  HPI  Review of Systems He currently denies any fever, chills, nausea, vomiting  Objective: Vital Signs: Ht 5\' 8"  (1.727 m)   Wt 186 lb (84.4 kg)   BMI 28.28 kg/m   Physical Exam He is alert and oriented in no acute distress but obvious discomfort and limited mobility. Ortho Exam Examination of his right hip shows that moves smoothly and fluidly and there is no pain in the groin to rotation or with compression of his right hip at all.  He seems to be hurting more with his low back and this radiates into the groin area but not in terms of his hip joint.  He does have low back pain to palpation. Specialty Comments:  No specialty comments available.  Imaging: XR Lumbar Spine 2-3 Views  Result Date: 10/02/2020 2 views of the lumbar  spine show multiple degenerative changes with the space narrowing and osteophytes throughout the lumbar spine.  There are changes around L2 suggestive of the acute to subacute compression fracture.  There is less than 20% loss of height.    PMFS History: Patient Active Problem List   Diagnosis Date Noted  . Unilateral primary osteoarthritis, left knee 04/17/2020  . Unilateral primary osteoarthritis, right knee 04/17/2020  . Coronary artery disease involving native coronary artery of native heart with angina pectoris ([place]) 11/06/2016  . Hypertensive heart and chronic kidney disease with heart failure and stage 1 through stage 4 chronic kidney disease, or chronic kidney disease ([place]) 11/06/2016  . Chronic anticoagulation 12/01/2013  . History of radiation therapy   . Prostate cancer ([place]) 05/31/2012  . Bradycardia 05/23/2012  . Atrial fibrillation ([place]) 05/23/2012   Past Medical History:  Diagnosis Date  . Arthritis   . BPH (benign prostatic hyperplasia)   . CAD (coronary artery disease)    s/p CABG 1991  . Carotid  artery occlusion   . Cataract    b/l surgery  . Chronic kidney disease (CKD), stage III (moderate) (HCC)   . Diverticulosis   . DJD (degenerative joint disease)   . DJD (degenerative joint disease)   . GERD (gastroesophageal reflux disease)   . Glaucoma   . Hiatal hernia   . History of adenomatous polyp of colon 06/07/13   Last colonoscopy 06/27/13 showed small adenoma, no further testing due to advanced age.  [name] History of radiation therapy 07/25/2012-09/20/2012   prostate 7800 cGy 40 sessions, seminal vesicles 5600 cGy 40 sessions  . History of shingles   . Hypercholesteremia   . Hyperlipidemia   . Hypertension   . Inguinal hernia   . Osteomyelitis of forearm, right, acute ([place])    drainage x 2  . Persistent atrial fibrillation ([place])   . Prostate cancer ([place]) 04/27/12   Dr. Eulogio Ditch. Radiation therapy. Adenocarcinoma,gleason=3+4=7, 3+3=6,PSA=4.93  volume=51cc  . Pulmonary nodules    small rlung base nodule 4.75mm, scarring in lung bases  . 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 Hypertension Mother   . Congestive Heart Failure Mother   . CAD Mother   . Alzheimer's disease Father   . CAD Brother   . Pancreatic cancer Brother   . CVA Brother   . Hypertension Sister        Has 6 sisters, many suffer from HTN  . Heart attack Brother   . Stroke Brother     Past Surgical History:  Procedure Laterality Date  . CARDIAC CATHETERIZATION  06/04/09   left w/noted patent bypass grafts  . CATARACT EXTRACTION W/ INTRAOCULAR LENS IMPLANT  2011  . COLONOSCOPY  12/21/2007   hx polyps, diverticulosis  . COLONOSCOPY WITH PROPOFOL N/A 06/27/2013   Procedure: COLONOSCOPY WITH PROPOFOL;  Surgeon: Garlan Fair, MD;  Location: [facility];  Service: Endoscopy;  Laterality: N/A;  . CORONARY ARTERY BYPASS GRAFT  1991   x5; prior MI  . PACEMAKER GENERATOR CHANGE   05/24/12  . PACEMAKER INSERTION  05/14/03   MDT EnPulse implanted by Dr Leonia Reeves ddd  . PERMANENT PACEMAKER GENERATOR CHANGE N/A 05/24/2012   Procedure: PERMANENT PACEMAKER GENERATOR CHANGE;  Surgeon: Thompson Grayer, MD;  Location[facility] CATH LAB;  Service: Cardiovascular;  Laterality: N/A;  . PROSTATE BIOPSY  04/27/12  Adenocarcinoma,gleason=3+3=6, 3+4,& 4+3,PSA=4.93,volume=51cc  . TUMOR REMOVAL     skin tumors removed   Social History   Occupational History  . Occupation: Retired    Comment: Theatre stage manager units  Tobacco Use  . Smoking status: Former Smoker    Packs/day: 0.50    Years: 10.00    Pack years: 5.00    Types: Cigarettes    Quit date: 08/24/1981    Years since quitting: 39.1  . Smokeless tobacco: Never Used  Vaping Use  . Vaping Use: Never used  Substance and Sexual Activity  . Alcohol use: No  . Drug use: No  . Sexual activity: Not on file

## 2020-10-04 ENCOUNTER — Other Ambulatory Visit: Payer: Medicare Other | Admitting: Nurse Practitioner

## 2020-10-04 ENCOUNTER — Other Ambulatory Visit: Payer: Self-pay

## 2020-10-04 DIAGNOSIS — Z515 Encounter for palliative care: Secondary | ICD-10-CM | POA: Diagnosis not present

## 2020-10-04 DIAGNOSIS — G894 Chronic pain syndrome: Secondary | ICD-10-CM

## 2020-10-04 NOTE — Progress Notes (Signed)
[facility] Consult Note Telephone: [phone]  Fax: [phone]  PATIENT NAME: Jonathan Warner [place] [id number] [phone] (home)  DOB: 1932-04-14 MRN: [id number]  PRIMARY CARE PROVIDER:    Josetta Huddle, MD,  [place] [place] [address] [phone]  REFERRING PROVIDER:   Josetta Huddle, MD [address] [place],  [place] [address] [phone]  RESPONSIBLE PARTY:   Extended Emergency Contact Information Primary Emergency Contact: [place], [place] Address: [address] [place]          [place], [place] [address] Johnnette Litter of [place] Phone: [phone] Mobile Phone: [phone] Relation: Spouse Secondary Emergency Contact: [place], [place] [address] Johnnette Litter of [place] Mobile Phone: [phone] Relation: Son  I met face to face with patient and family in home.  ASSESSMENT AND RECOMMENDATIONS:   Advance Care Planning: Today's visit consisted of building trust and discussions on Palliative care medicine as a specialized medical care for people living with serious illness, aimed at facilitating better quality of life through symptoms relief, assisting with advance care planning and establishing goals of care. Patient's wife and son present at visit, they expressed appreciation for education provided on Palliative care and how it differs from Hospice service. Palliative care will continue to provide support to patient, family and the medical team. Goal of care: Patient goal of care is comfort and function, patient verbalized desire to be pain free. He want to be able to do the things he enjoy doing without pain.   Directives: Patient does not want to be resuscitated in the event of a natural death, he however want attempt at resuscitation if he has a witnessed cardiac arrest. Patient has a signed MOST form in home with above directions. Details of his MOST form  include; full scope of Treatment, antibiotics if indicated, IV fluids if indicated, no feeding tube. Symptom Management:  Chronic pan: Current pain medication regimen include Tramadol 95m twice a day. Patient report using Prednisone in the past without improvement. Report pain affects sleep and appetite,  expressed concern for addiction if he continues using Tramadol.  Recommendation: Patient may increase Tramadol 557mfrom twice a day to three times a day. Patient educated on need for adequate pain control to align with his goal of increased function, we agreed that risk of addiction does not outweigh the benefit of adequate pain control for him at this time. Patient verbalized understanding. Frequent falls: Patient report frequent fall related to weakness in knees. Patient currently uses cane for ambulation. Has some difficulty with unsteady gait. Recommendation: Patient would benefit from using a walker. Consider referral to physical therapy for evaluation and fitting for a walker.  Follow up Palliative Care Visit: Palliative care will continue to follow for complex decision making, goal of care clarification, and symptom management. Return in about 4 weeks or prn.  Family /Caregiver/Community Supports: Patient lives at home with wife, who is his main care giver. They have a son together who is a fiEstate agentan.  Cognitive / Functional decline: Patient awake, alert and coherent. He is able to make his own decisions. He is currently able to complete his ADLs independently, ambulates with a cane. Has frequent falls, last fall was last week- no report of injury.  I spent 60 minutes providing this consultation, time includes time spent with patient and family, chart review, provider coordination, and documentation. More than 50%  of the time in this consultation was spent counseling and coordinating communication.   CHIEF COMPLAINT: Uncontrolled pain  History obtained from review of EMR, and interview with  patient and family. Records reviewed and summarized bellow.  HISTORY OF PRESENT ILLNESS:  Jonathan Warner is a 87 y.o. year old male with multiple medical problems including Afib (on Eliquis), CAD, CKD 3, degenerative joint diease, glaucoma, GERD, hx of prostate cancer (s/p radiation therapy), pulmonary nodules. Palliative Care was asked to follow this patient to help address advance care planning and goals of care. This is an initial  visit.  CODE STATUS: Full Code  PPS: 50%  HOSPICE ELIGIBILITY/DIAGNOSIS: TBD  PHYSICAL EXAM / ROS:   Current and past weights: 177lbs down from baseline weight of 193lbs.  General: frail appearing, sitting in recliner in his living room in NAD Cardiovascular: denied chest pain, denied palpitation, no edema  Pulmonary: no acute cough, no increased SOB, room air GI: no swallowing issues, appetite fair, denied constipation, continent of bowel GU: denies dysuria, continent of urine MSK:  Atheritic changes in finger joints, ambulatory Skin: no rashes, mild ecchymotic area to right upper arm Neurological: Weakness, but otherwise nonfocal Psych: non -anxious affect  PAST MEDICAL HISTORY:  Past Medical History:  Diagnosis Date  . Arthritis   . BPH (benign prostatic hyperplasia)   . CAD (coronary artery disease)    s/p CABG 1991  . Carotid artery occlusion   . Cataract    b/l surgery  . Chronic kidney disease (CKD), stage III (moderate) (HCC)   . Diverticulosis   . DJD (degenerative joint disease)   . DJD (degenerative joint disease)   . GERD (gastroesophageal reflux disease)   . Glaucoma   . Hiatal hernia   . History of adenomatous polyp of colon 06/07/13   Last colonoscopy 06/27/13 showed small adenoma, no further testing due to advanced age.  [name] History of radiation therapy 07/25/2012-09/20/2012   prostate 7800 cGy 40 sessions, seminal vesicles 5600 cGy 40 sessions  . History of shingles   . Hypercholesteremia   . Hyperlipidemia   . Hypertension    . Inguinal hernia   . Osteomyelitis of forearm, right, acute ([place])    drainage x 2  . Persistent atrial fibrillation ([place])   . Prostate cancer ([place]) 04/27/12   Dr. Eulogio Ditch. Radiation therapy. Adenocarcinoma,gleason=3+4=7, 3+3=6,PSA=4.93  volume=51cc  . Pulmonary nodules    small rlung base nodule 4.41m, scarring in lung bases  . Symptomatic bradycardia    s/p PPM by Dr [name]2004  . Undescended left testicle    absent left since birth    SOCIAL HX:  Social History   Tobacco Use  . Smoking status: Former Smoker    Packs/day: 0.50    Years: 10.00    Pack years: 5.00    Types: Cigarettes    Quit date: 08/24/1981    Years since quitting: 39.1  . Smokeless tobacco: Never Used  Substance Use Topics  . Alcohol use: No   FAMILY HX:  Family History  Problem Relation Age of Onset  . Hypertension Mother   . Congestive Heart Failure Mother   . CAD Mother   . Alzheimer's disease Father   . CAD Brother   . Pancreatic cancer Brother   . CVA Brother   . Hypertension Sister        Has 6 sisters, many suffer from HTN  . Heart attack Brother   . Stroke Brother     ALLERGIES:  Allergies  Allergen Reactions  . Pilocarpine Other (See Comments)    Made him feel very funny feeling.  . Rosuvastatin Other (See Comments)    Knee and leg pain Knee and leg pain     PERTINENT MEDICATIONS:  Outpatient Encounter Medications as of 10/04/2020  Medication Sig  . acetaminophen (TYLENOL) 500 MG tablet Take 1,000 mg by mouth every 6 (six) hours as needed for pain.   [name] amLODipine (NORVASC) 5 MG tablet Take 1 tablet (5 mg total) by mouth daily.  [name] apixaban (ELIQUIS) 5 MG TABS tablet Take 5 mg by mouth 2 (two) times daily.  . brimonidine (ALPHAGAN) 0.2 % ophthalmic solution Place 1 drop into both eyes 3 (three) times daily.   . Cholecalciferol (VITAMIN D3) 2000 units TABS Take 1 tablet by mouth daily.   . dorzolamide (TRUSOPT) 2 % ophthalmic solution Place 1 drop into both eyes 3 (three) times  daily.   . furosemide (LASIX) 40 MG tablet Take 2 tablets by mouth every morning.  Take one tablet by mouth every evening.  . latanoprost (XALATAN) 0.005 % ophthalmic solution Apply 1 drop to eye at bedtime.   . meclizine (ANTIVERT) 25 MG tablet Take 1 tablet (25 mg total) by mouth 3 (three) times daily as needed for dizziness.  . Multiple Vitamin (MULTIVITAMIN) tablet Take 1 tablet by mouth daily.  . nitroGLYCERIN (NITROSTAT) 0.4 MG SL tablet PLACE 1 TABLET UNDER TONGUE EVERY FIVE MINUTES AS NEEDED FOR CHEST PAIN.  [name] omeprazole (PRILOSEC) 20 MG capsule Take 20 mg by mouth daily.  . potassium chloride SA (KLOR-CON M20) 20 MEQ tablet Take 1 tablet (20 mEq total) by mouth 2 (two) times daily.  . traMADol (ULTRAM) 50 MG tablet Take 1 tablet (50 mg total) by mouth every 6 (six) hours as needed.  . vitamin E 400 UNIT capsule Take 400 Units by mouth daily.   No facility-administered encounter medications on file as of 10/04/2020.    Thank you for the opportunity to participate in the care of Mr. Ukiah Varano. The palliative care team will continue to follow. Please call our office at [phone] if we can be of additional assistance.  Jari Favre, DNP, AGPCNP-BC

## 2020-10-11 ENCOUNTER — Other Ambulatory Visit: Payer: Self-pay | Admitting: Orthopaedic Surgery

## 2020-10-11 NOTE — Telephone Encounter (Signed)
Please advise 

## 2020-10-16 ENCOUNTER — Other Ambulatory Visit: Payer: Self-pay

## 2020-10-16 ENCOUNTER — Encounter: Payer: Self-pay | Admitting: Orthopaedic Surgery

## 2020-10-16 ENCOUNTER — Ambulatory Visit (INDEPENDENT_AMBULATORY_CARE_PROVIDER_SITE_OTHER): Payer: Medicare Other | Admitting: Orthopaedic Surgery

## 2020-10-16 ENCOUNTER — Ambulatory Visit: Payer: Medicare Other | Admitting: Interventional Cardiology

## 2020-10-16 ENCOUNTER — Ambulatory Visit (INDEPENDENT_AMBULATORY_CARE_PROVIDER_SITE_OTHER): Payer: Medicare Other

## 2020-10-16 DIAGNOSIS — M545 Low back pain, unspecified: Secondary | ICD-10-CM

## 2020-10-16 DIAGNOSIS — M25551 Pain in right hip: Secondary | ICD-10-CM

## 2020-10-16 MED ORDER — HYDROCODONE-ACETAMINOPHEN 5-325 MG PO TABS: 1.0000 | ORAL_TABLET | Freq: Four times a day (QID) | ORAL | 0 refills | Status: DC | PRN

## 2020-10-16 NOTE — Progress Notes (Signed)
   The patient is continuing to suffer from severe low back pain.  It is just above the belt line where it hurts of the midline.  He is ambulate with a walker.  His pain is daily.  X-rays from 2 weeks ago she had some degenerative changes in his lumbar spine and question of compression fracture at L2.  I did repeat x-rays today and it may be more of L3 where he has a slight compression deformity.  There is severe degenerative changes at the levels above this and below this.  He does have a pacemaker.  He is also on Eliquis.  Her control lumbar back brace as well as seeing if home health physical therapy can help with his mobility and home assessment for equipment needs.  We will try just a little bit of hydrocodone for more of his severe pain.  I have ordered a CT scan of the lumbar spine since he cannot have an MRI to further assess the architecture of the vertebral bodies.  I would like to see him back in just a week.  His son is with him and there please with that treatment plan.

## 2020-10-17 ENCOUNTER — Ambulatory Visit
Admission: RE | Admit: 2020-10-17 | Discharge: 2020-10-17 | Disposition: A | Discharge status: Home / Self Care | Payer: Medicare Other | Source: Ambulatory Visit | Attending: Orthopaedic Surgery | Admitting: Orthopaedic Surgery

## 2020-10-17 ENCOUNTER — Telehealth: Payer: Self-pay | Admitting: Hematology

## 2020-10-17 ENCOUNTER — Other Ambulatory Visit: Payer: Self-pay

## 2020-10-17 ENCOUNTER — Other Ambulatory Visit: Payer: Medicare Other

## 2020-10-17 DIAGNOSIS — Z8546 Personal history of malignant neoplasm of prostate: Secondary | ICD-10-CM | POA: Diagnosis not present

## 2020-10-17 DIAGNOSIS — S32000A Wedge compression fracture of unspecified lumbar vertebra, initial encounter for closed fracture: Secondary | ICD-10-CM | POA: Diagnosis not present

## 2020-10-17 DIAGNOSIS — M8458XA Pathological fracture in neoplastic disease, other specified site, initial encounter for fracture: Secondary | ICD-10-CM | POA: Diagnosis not present

## 2020-10-17 DIAGNOSIS — M47816 Spondylosis without myelopathy or radiculopathy, lumbar region: Secondary | ICD-10-CM | POA: Diagnosis not present

## 2020-10-17 DIAGNOSIS — M545 Low back pain, unspecified: Secondary | ICD-10-CM

## 2020-10-17 NOTE — Telephone Encounter (Signed)
Received a new pr referral from Dr. Ninfa Linden for evaluate for possible metastatic cancer. Pt. has a metastatic lesion with a pathologic fracture of the lumbar spine at L2. Jonathan Warner has been cld and scheduled to see Dr. Irene Limbo on 3/3 at 1pm. Pt and his wife are aware to arrive 20 minutes early.

## 2020-10-18 ENCOUNTER — Encounter: Payer: Self-pay | Admitting: Orthopaedic Surgery

## 2020-10-18 ENCOUNTER — Other Ambulatory Visit: Payer: Self-pay | Admitting: Orthopaedic Surgery

## 2020-10-18 NOTE — Telephone Encounter (Signed)
Pls advise.  

## 2020-10-19 ENCOUNTER — Other Ambulatory Visit: Payer: Self-pay

## 2020-10-19 ENCOUNTER — Encounter (HOSPITAL_COMMUNITY): Payer: Self-pay | Admitting: Emergency Medicine

## 2020-10-19 ENCOUNTER — Inpatient Hospital Stay (HOSPITAL_COMMUNITY)
Admission: EM | Admit: 2020-10-19 | Discharge: 2020-10-31 | DRG: 981 | Disposition: A | Discharge status: Home Health | Payer: Medicare Other | Attending: Internal Medicine | Admitting: Internal Medicine

## 2020-10-19 DIAGNOSIS — S32020S Wedge compression fracture of second lumbar vertebra, sequela: Secondary | ICD-10-CM | POA: Diagnosis not present

## 2020-10-19 DIAGNOSIS — M545 Low back pain, unspecified: Secondary | ICD-10-CM | POA: Diagnosis present

## 2020-10-19 DIAGNOSIS — M899 Disorder of bone, unspecified: Secondary | ICD-10-CM | POA: Diagnosis not present

## 2020-10-19 DIAGNOSIS — Z20822 Contact with and (suspected) exposure to covid-19: Secondary | ICD-10-CM | POA: Diagnosis present

## 2020-10-19 DIAGNOSIS — Z923 Personal history of irradiation: Secondary | ICD-10-CM | POA: Diagnosis not present

## 2020-10-19 DIAGNOSIS — R262 Difficulty in walking, not elsewhere classified: Secondary | ICD-10-CM

## 2020-10-19 DIAGNOSIS — R634 Abnormal weight loss: Secondary | ICD-10-CM | POA: Diagnosis not present

## 2020-10-19 DIAGNOSIS — Z515 Encounter for palliative care: Secondary | ICD-10-CM

## 2020-10-19 DIAGNOSIS — M8458XA Pathological fracture in neoplastic disease, other specified site, initial encounter for fracture: Secondary | ICD-10-CM | POA: Diagnosis present

## 2020-10-19 DIAGNOSIS — K224 Dyskinesia of esophagus: Secondary | ICD-10-CM | POA: Diagnosis present

## 2020-10-19 DIAGNOSIS — I700 Atherosclerosis of aorta: Secondary | ICD-10-CM | POA: Diagnosis not present

## 2020-10-19 DIAGNOSIS — Z888 Allergy status to other drugs, medicaments and biological substances status: Secondary | ICD-10-CM | POA: Diagnosis not present

## 2020-10-19 DIAGNOSIS — M4850XA Collapsed vertebra, not elsewhere classified, site unspecified, initial encounter for fracture: Secondary | ICD-10-CM

## 2020-10-19 DIAGNOSIS — I129 Hypertensive chronic kidney disease with stage 1 through stage 4 chronic kidney disease, or unspecified chronic kidney disease: Secondary | ICD-10-CM | POA: Diagnosis not present

## 2020-10-19 DIAGNOSIS — Z743 Need for continuous supervision: Secondary | ICD-10-CM | POA: Diagnosis not present

## 2020-10-19 DIAGNOSIS — R1031 Right lower quadrant pain: Secondary | ICD-10-CM | POA: Diagnosis present

## 2020-10-19 DIAGNOSIS — N400 Enlarged prostate without lower urinary tract symptoms: Secondary | ICD-10-CM | POA: Diagnosis present

## 2020-10-19 DIAGNOSIS — E871 Hypo-osmolality and hyponatremia: Secondary | ICD-10-CM | POA: Diagnosis present

## 2020-10-19 DIAGNOSIS — R6881 Early satiety: Secondary | ICD-10-CM | POA: Diagnosis not present

## 2020-10-19 DIAGNOSIS — R52 Pain, unspecified: Secondary | ICD-10-CM | POA: Diagnosis not present

## 2020-10-19 DIAGNOSIS — Z79899 Other long term (current) drug therapy: Secondary | ICD-10-CM

## 2020-10-19 DIAGNOSIS — I130 Hypertensive heart and chronic kidney disease with heart failure and stage 1 through stage 4 chronic kidney disease, or unspecified chronic kidney disease: Secondary | ICD-10-CM | POA: Diagnosis present

## 2020-10-19 DIAGNOSIS — C3491 Malignant neoplasm of unspecified part of right bronchus or lung: Secondary | ICD-10-CM | POA: Diagnosis not present

## 2020-10-19 DIAGNOSIS — Z9889 Other specified postprocedural states: Secondary | ICD-10-CM | POA: Diagnosis not present

## 2020-10-19 DIAGNOSIS — Q539 Undescended testicle, unspecified: Secondary | ICD-10-CM | POA: Diagnosis not present

## 2020-10-19 DIAGNOSIS — I25119 Atherosclerotic heart disease of native coronary artery with unspecified angina pectoris: Secondary | ICD-10-CM | POA: Diagnosis not present

## 2020-10-19 DIAGNOSIS — R269 Unspecified abnormalities of gait and mobility: Secondary | ICD-10-CM | POA: Diagnosis present

## 2020-10-19 DIAGNOSIS — R846 Abnormal cytological findings in specimens from respiratory organs and thorax: Secondary | ICD-10-CM | POA: Diagnosis not present

## 2020-10-19 DIAGNOSIS — I4891 Unspecified atrial fibrillation: Secondary | ICD-10-CM | POA: Diagnosis present

## 2020-10-19 DIAGNOSIS — C799 Secondary malignant neoplasm of unspecified site: Secondary | ICD-10-CM | POA: Diagnosis not present

## 2020-10-19 DIAGNOSIS — C3492 Malignant neoplasm of unspecified part of left bronchus or lung: Secondary | ICD-10-CM | POA: Diagnosis present

## 2020-10-19 DIAGNOSIS — Z7901 Long term (current) use of anticoagulants: Secondary | ICD-10-CM | POA: Diagnosis not present

## 2020-10-19 DIAGNOSIS — I4819 Other persistent atrial fibrillation: Secondary | ICD-10-CM | POA: Diagnosis present

## 2020-10-19 DIAGNOSIS — R930 Abnormal findings on diagnostic imaging of skull and head, not elsewhere classified: Secondary | ICD-10-CM | POA: Diagnosis not present

## 2020-10-19 DIAGNOSIS — Z950 Presence of cardiac pacemaker: Secondary | ICD-10-CM | POA: Diagnosis not present

## 2020-10-19 DIAGNOSIS — M549 Dorsalgia, unspecified: Secondary | ICD-10-CM | POA: Diagnosis not present

## 2020-10-19 DIAGNOSIS — Z87891 Personal history of nicotine dependence: Secondary | ICD-10-CM | POA: Diagnosis not present

## 2020-10-19 DIAGNOSIS — I5032 Chronic diastolic (congestive) heart failure: Secondary | ICD-10-CM | POA: Diagnosis present

## 2020-10-19 DIAGNOSIS — R140 Abdominal distension (gaseous): Secondary | ICD-10-CM | POA: Diagnosis not present

## 2020-10-19 DIAGNOSIS — G893 Neoplasm related pain (acute) (chronic): Secondary | ICD-10-CM | POA: Diagnosis not present

## 2020-10-19 DIAGNOSIS — M170 Bilateral primary osteoarthritis of knee: Secondary | ICD-10-CM | POA: Diagnosis present

## 2020-10-19 DIAGNOSIS — I252 Old myocardial infarction: Secondary | ICD-10-CM | POA: Diagnosis not present

## 2020-10-19 DIAGNOSIS — E782 Mixed hyperlipidemia: Secondary | ICD-10-CM | POA: Diagnosis present

## 2020-10-19 DIAGNOSIS — E876 Hypokalemia: Secondary | ICD-10-CM | POA: Diagnosis not present

## 2020-10-19 DIAGNOSIS — S32020A Wedge compression fracture of second lumbar vertebra, initial encounter for closed fracture: Secondary | ICD-10-CM

## 2020-10-19 DIAGNOSIS — W19XXXA Unspecified fall, initial encounter: Secondary | ICD-10-CM | POA: Diagnosis present

## 2020-10-19 DIAGNOSIS — K449 Diaphragmatic hernia without obstruction or gangrene: Secondary | ICD-10-CM | POA: Diagnosis present

## 2020-10-19 DIAGNOSIS — Z8249 Family history of ischemic heart disease and other diseases of the circulatory system: Secondary | ICD-10-CM | POA: Diagnosis not present

## 2020-10-19 DIAGNOSIS — Z66 Do not resuscitate: Secondary | ICD-10-CM | POA: Diagnosis present

## 2020-10-19 DIAGNOSIS — M8448XA Pathological fracture, other site, initial encounter for fracture: Secondary | ICD-10-CM | POA: Diagnosis not present

## 2020-10-19 DIAGNOSIS — C801 Malignant (primary) neoplasm, unspecified: Secondary | ICD-10-CM | POA: Diagnosis not present

## 2020-10-19 DIAGNOSIS — R918 Other nonspecific abnormal finding of lung field: Secondary | ICD-10-CM | POA: Diagnosis not present

## 2020-10-19 DIAGNOSIS — Z951 Presence of aortocoronary bypass graft: Secondary | ICD-10-CM

## 2020-10-19 DIAGNOSIS — C3411 Malignant neoplasm of upper lobe, right bronchus or lung: Principal | ICD-10-CM | POA: Diagnosis present

## 2020-10-19 DIAGNOSIS — Z7189 Other specified counseling: Secondary | ICD-10-CM | POA: Diagnosis not present

## 2020-10-19 DIAGNOSIS — I2510 Atherosclerotic heart disease of native coronary artery without angina pectoris: Secondary | ICD-10-CM | POA: Diagnosis present

## 2020-10-19 DIAGNOSIS — C3431 Malignant neoplasm of lower lobe, right bronchus or lung: Secondary | ICD-10-CM | POA: Diagnosis not present

## 2020-10-19 DIAGNOSIS — K219 Gastro-esophageal reflux disease without esophagitis: Secondary | ICD-10-CM | POA: Diagnosis present

## 2020-10-19 DIAGNOSIS — C3490 Malignant neoplasm of unspecified part of unspecified bronchus or lung: Secondary | ICD-10-CM | POA: Diagnosis not present

## 2020-10-19 DIAGNOSIS — Z800 Family history of malignant neoplasm of digestive organs: Secondary | ICD-10-CM

## 2020-10-19 DIAGNOSIS — I495 Sick sinus syndrome: Secondary | ICD-10-CM | POA: Diagnosis present

## 2020-10-19 DIAGNOSIS — M8440XA Pathological fracture, unspecified site, initial encounter for fracture: Secondary | ICD-10-CM | POA: Diagnosis present

## 2020-10-19 DIAGNOSIS — H539 Unspecified visual disturbance: Secondary | ICD-10-CM | POA: Diagnosis not present

## 2020-10-19 DIAGNOSIS — I509 Heart failure, unspecified: Secondary | ICD-10-CM | POA: Diagnosis not present

## 2020-10-19 DIAGNOSIS — H409 Unspecified glaucoma: Secondary | ICD-10-CM | POA: Diagnosis present

## 2020-10-19 DIAGNOSIS — R279 Unspecified lack of coordination: Secondary | ICD-10-CM | POA: Diagnosis not present

## 2020-10-19 DIAGNOSIS — R5381 Other malaise: Secondary | ICD-10-CM | POA: Diagnosis not present

## 2020-10-19 DIAGNOSIS — I480 Paroxysmal atrial fibrillation: Secondary | ICD-10-CM | POA: Diagnosis not present

## 2020-10-19 DIAGNOSIS — Z7952 Long term (current) use of systemic steroids: Secondary | ICD-10-CM | POA: Diagnosis not present

## 2020-10-19 DIAGNOSIS — I5033 Acute on chronic diastolic (congestive) heart failure: Secondary | ICD-10-CM | POA: Diagnosis not present

## 2020-10-19 DIAGNOSIS — I672 Cerebral atherosclerosis: Secondary | ICD-10-CM | POA: Diagnosis not present

## 2020-10-19 DIAGNOSIS — Z8546 Personal history of malignant neoplasm of prostate: Secondary | ICD-10-CM | POA: Diagnosis not present

## 2020-10-19 DIAGNOSIS — R739 Hyperglycemia, unspecified: Secondary | ICD-10-CM | POA: Diagnosis not present

## 2020-10-19 DIAGNOSIS — C61 Malignant neoplasm of prostate: Secondary | ICD-10-CM | POA: Diagnosis present

## 2020-10-19 DIAGNOSIS — N183 Chronic kidney disease, stage 3 unspecified: Secondary | ICD-10-CM | POA: Diagnosis present

## 2020-10-19 DIAGNOSIS — Z86010 Personal history of colonic polyps: Secondary | ICD-10-CM

## 2020-10-19 DIAGNOSIS — I517 Cardiomegaly: Secondary | ICD-10-CM | POA: Diagnosis not present

## 2020-10-19 DIAGNOSIS — Z820 Family history of epilepsy and other diseases of the nervous system: Secondary | ICD-10-CM

## 2020-10-19 DIAGNOSIS — Z823 Family history of stroke: Secondary | ICD-10-CM

## 2020-10-19 DIAGNOSIS — M1712 Unilateral primary osteoarthritis, left knee: Secondary | ICD-10-CM | POA: Diagnosis not present

## 2020-10-19 DIAGNOSIS — N184 Chronic kidney disease, stage 4 (severe): Secondary | ICD-10-CM | POA: Diagnosis not present

## 2020-10-19 DIAGNOSIS — Z77090 Contact with and (suspected) exposure to asbestos: Secondary | ICD-10-CM | POA: Diagnosis present

## 2020-10-19 DIAGNOSIS — M4856XA Collapsed vertebra, not elsewhere classified, lumbar region, initial encounter for fracture: Secondary | ICD-10-CM | POA: Diagnosis not present

## 2020-10-19 DIAGNOSIS — C7951 Secondary malignant neoplasm of bone: Secondary | ICD-10-CM

## 2020-10-19 LAB — CBC
HCT: 38.2 % — ABNORMAL LOW (ref 39.0–52.0)
Hemoglobin: 12 g/dL — ABNORMAL LOW (ref 13.0–17.0)
MCH: 31.1 pg (ref 26.0–34.0)
MCHC: 31.4 g/dL (ref 30.0–36.0)
MCV: 99 fL (ref 80.0–100.0)
Platelets: 184 10*3/uL (ref 150–400)
RBC: 3.86 MIL/uL — ABNORMAL LOW (ref 4.22–5.81)
RDW: 13.8 % (ref 11.5–15.5)
WBC: 6.7 10*3/uL (ref 4.0–10.5)
nRBC: 0 % (ref 0.0–0.2)

## 2020-10-19 LAB — SARS CORONAVIRUS 2 (TAT 6-24 HRS): SARS Coronavirus 2: NEGATIVE

## 2020-10-19 LAB — COMPREHENSIVE METABOLIC PANEL
ALT: 11 U/L (ref 0–44)
AST: 23 U/L (ref 15–41)
Albumin: 3.4 g/dL — ABNORMAL LOW (ref 3.5–5.0)
Alkaline Phosphatase: 95 U/L (ref 38–126)
Anion gap: 13 (ref 5–15)
BUN: 12 mg/dL (ref 8–23)
CO2: 29 mmol/L (ref 22–32)
Calcium: 9.6 mg/dL (ref 8.9–10.3)
Chloride: 97 mmol/L — ABNORMAL LOW (ref 98–111)
Creatinine, Ser: 1.15 mg/dL (ref 0.61–1.24)
GFR, Estimated: >60 mL/min (ref >60)
Glucose, Bld: 114 mg/dL — ABNORMAL HIGH (ref 70–99)
Potassium: 3 mmol/L — ABNORMAL LOW (ref 3.5–5.1)
Sodium: 139 mmol/L (ref 135–145)
Total Bilirubin: 1.9 mg/dL — ABNORMAL HIGH (ref 0.3–1.2)
Total Protein: 6.7 g/dL (ref 6.5–8.1)

## 2020-10-19 LAB — URINALYSIS, ROUTINE W REFLEX MICROSCOPIC
Bilirubin Urine: NEGATIVE
Glucose, UA: NEGATIVE mg/dL
Hgb urine dipstick: NEGATIVE
Ketones, ur: NEGATIVE mg/dL
Leukocytes,Ua: NEGATIVE
Nitrite: NEGATIVE
Protein, ur: NEGATIVE mg/dL
Specific Gravity, Urine: 1.014 (ref 1.005–1.030)
pH: 7 (ref 5.0–8.0)

## 2020-10-19 LAB — MAGNESIUM: Magnesium: 1.6 mg/dL — ABNORMAL LOW (ref 1.7–2.4)

## 2020-10-19 LAB — PSA: Prostatic Specific Antigen: 0.02 ng/mL (ref 0.00–4.00)

## 2020-10-19 MED ORDER — PANTOPRAZOLE SODIUM 40 MG PO TBEC
40.0000 mg | DELAYED_RELEASE_TABLET | Freq: Every day | ORAL | Status: DC
  Administered 2020-10-19 – 2020-10-31 (×11): 40 mg via ORAL
  Filled 2020-10-19 (×12): qty 1

## 2020-10-19 MED ORDER — IOHEXOL 9 MG/ML PO SOLN
500.0000 mL | ORAL | Status: AC
  Administered 2020-10-19: 500 mL via ORAL

## 2020-10-19 MED ORDER — BRIMONIDINE TARTRATE 0.2 % OP SOLN
1.0000 [drp] | Freq: Three times a day (TID) | OPHTHALMIC | Status: DC
  Administered 2020-10-19 – 2020-10-31 (×34): 1 [drp] via OPHTHALMIC
  Filled 2020-10-19: qty 5

## 2020-10-19 MED ORDER — OXYCODONE HCL 5 MG PO TABS
5.0000 mg | ORAL_TABLET | ORAL | Status: DC | PRN
  Filled 2020-10-19: qty 1

## 2020-10-19 MED ORDER — DEXAMETHASONE SODIUM PHOSPHATE 10 MG/ML IJ SOLN
20.0000 mg | INTRAMUSCULAR | Status: DC
  Administered 2020-10-19 – 2020-10-22 (×4): 20 mg via INTRAVENOUS
  Filled 2020-10-19: qty 2
  Filled 2020-10-19: qty 5
  Filled 2020-10-19 (×3): qty 2

## 2020-10-19 MED ORDER — POTASSIUM CHLORIDE CRYS ER 20 MEQ PO TBCR
40.0000 meq | EXTENDED_RELEASE_TABLET | Freq: Every day | ORAL | Status: DC
  Administered 2020-10-19 – 2020-10-31 (×11): 40 meq via ORAL
  Filled 2020-10-19 (×12): qty 2

## 2020-10-19 MED ORDER — SODIUM CHLORIDE 0.9 % IV SOLN
60.0000 mg | Freq: Once | INTRAVENOUS | Status: AC
  Administered 2020-10-19: 60 mg via INTRAVENOUS
  Filled 2020-10-19: qty 20

## 2020-10-19 MED ORDER — AMLODIPINE BESYLATE 5 MG PO TABS
5.0000 mg | ORAL_TABLET | Freq: Every day | ORAL | Status: DC
  Administered 2020-10-19 – 2020-10-31 (×12): 5 mg via ORAL
  Filled 2020-10-19 (×12): qty 1

## 2020-10-19 MED ORDER — SODIUM CHLORIDE 0.9 % IV SOLN
1000.0000 mL | INTRAVENOUS | Status: DC
  Administered 2020-10-19: 1000 mL via INTRAVENOUS

## 2020-10-19 MED ORDER — SODIUM CHLORIDE 0.9 % IV BOLUS (SEPSIS)
500.0000 mL | Freq: Once | INTRAVENOUS | Status: AC
  Administered 2020-10-19: 500 mL via INTRAVENOUS

## 2020-10-19 MED ORDER — APIXABAN 5 MG PO TABS
5.0000 mg | ORAL_TABLET | Freq: Two times a day (BID) | ORAL | Status: DC
  Administered 2020-10-19: 5 mg via ORAL
  Filled 2020-10-19: qty 1

## 2020-10-19 MED ORDER — HEPARIN (PORCINE) 25000 UT/250ML-% IV SOLN
1150.0000 [IU]/h | INTRAVENOUS | Status: DC
  Administered 2020-10-20: 850 [IU]/h via INTRAVENOUS
  Administered 2020-10-21 – 2020-10-22 (×2): 1150 [IU]/h via INTRAVENOUS
  Filled 2020-10-19 (×5): qty 250

## 2020-10-19 MED ORDER — DORZOLAMIDE HCL 2 % OP SOLN
1.0000 [drp] | Freq: Three times a day (TID) | OPHTHALMIC | Status: DC
  Administered 2020-10-19 – 2020-10-31 (×34): 1 [drp] via OPHTHALMIC
  Filled 2020-10-19: qty 10

## 2020-10-19 MED ORDER — TROLAMINE SALICYLATE 10 % EX CREA: 1.0000 "application " | TOPICAL_CREAM | CUTANEOUS | Status: DC | PRN

## 2020-10-19 MED ORDER — ONDANSETRON HCL 4 MG/2ML IJ SOLN
4.0000 mg | Freq: Four times a day (QID) | INTRAMUSCULAR | Status: DC | PRN
  Administered 2020-10-19 – 2020-10-29 (×2): 4 mg via INTRAVENOUS
  Filled 2020-10-19 (×2): qty 2

## 2020-10-19 MED ORDER — POTASSIUM CHLORIDE CRYS ER 20 MEQ PO TBCR
40.0000 meq | EXTENDED_RELEASE_TABLET | Freq: Once | ORAL | Status: AC
  Administered 2020-10-19: 40 meq via ORAL
  Filled 2020-10-19: qty 2

## 2020-10-19 MED ORDER — VITAMIN D 25 MCG (1000 UNIT) PO TABS
2000.0000 [IU] | ORAL_TABLET | Freq: Every day | ORAL | Status: DC
  Administered 2020-10-19 – 2020-10-31 (×11): 2000 [IU] via ORAL
  Filled 2020-10-19 (×12): qty 2

## 2020-10-19 MED ORDER — LATANOPROST 0.005 % OP SOLN
1.0000 [drp] | Freq: Every day | OPHTHALMIC | Status: DC
  Administered 2020-10-19 – 2020-10-30 (×12): 1 [drp] via OPHTHALMIC
  Filled 2020-10-19: qty 2.5

## 2020-10-19 MED ORDER — HYDROMORPHONE HCL 1 MG/ML IJ SOLN
0.5000 mg | INTRAMUSCULAR | Status: DC | PRN
  Administered 2020-10-19 – 2020-10-21 (×3): 1 mg via INTRAVENOUS
  Filled 2020-10-19 (×3): qty 1

## 2020-10-19 MED ORDER — POLYETHYLENE GLYCOL 3350 17 G PO PACK
17.0000 g | PACK | Freq: Every day | ORAL | Status: DC
  Administered 2020-10-19 – 2020-10-21 (×3): 17 g via ORAL
  Filled 2020-10-19 (×3): qty 1

## 2020-10-19 MED ORDER — HYDROMORPHONE HCL 1 MG/ML IJ SOLN
0.5000 mg | INTRAMUSCULAR | Status: DC | PRN
  Administered 2020-10-19: 0.5 mg via INTRAVENOUS
  Filled 2020-10-19: qty 1

## 2020-10-19 MED ORDER — ENSURE ENLIVE PO LIQD
237.0000 mL | Freq: Two times a day (BID) | ORAL | Status: DC
  Administered 2020-10-19 – 2020-10-20 (×2): 237 mL via ORAL
  Filled 2020-10-19: qty 237

## 2020-10-19 MED ORDER — IOHEXOL 9 MG/ML PO SOLN
ORAL | Status: AC
  Administered 2020-10-19: 500 mL
  Filled 2020-10-19: qty 1000

## 2020-10-19 MED ORDER — FUROSEMIDE 40 MG PO TABS
80.0000 mg | ORAL_TABLET | Freq: Every day | ORAL | Status: DC
  Administered 2020-10-19 – 2020-10-31 (×13): 80 mg via ORAL
  Filled 2020-10-19 (×13): qty 2

## 2020-10-19 NOTE — ED Triage Notes (Signed)
20 g in left hand and given Fentanyl 100 at 1000

## 2020-10-19 NOTE — H&P (Signed)
History and Physical    Jonathan Warner XWR:[id number] DOB: 04/11/1932 DOA: 10/19/2020  PCP: Josetta Huddle, MD (Confirm with patient/family/NH records and if not entered, this has to be entered at [facility] point of entry) Patient coming from: Home  I have personally briefly reviewed patient's old medical records in [facility]  Chief Complaint: Severe back pain  HPI: Jonathan Warner is a 87 y.o. male with medical history significant of remote history of prostate CA status post surgical removal and radiation, CAD s/p CABG, chronic A. fib on Eliquis, tachybradycardia syndrome status post PPM, chronic diastolic CHF, recently diagnosed L2 metastatic CA and pathologic fracture presented with worsening of back pain and ambulation dysfunction.  Patient had a fall around Christmas time, and developed back pain, then subsequently diagnosed with L2 pathologic fracture and had outpatient CT scan showed possible metastatic CA. He went to see urology, who told him unlikely there is a recurrent prostate CA. And patient has been following with orthopedic surgery for pain management with worsening of back pain and ambulation dysfunction, and he was given a lumbar brace for ambulation. He denies any urinary problem or problem moving his bowels, no numbness or weakness of bilateral legs. His appetite has decreased significantly for last 2 months. She has been pretty much bedbound for the last few weeks because of leg pain, even minimum movement from bed to bathroom midcoast 10/10 pain.  ED Course: Oncology was consulted.  Review of Systems: As per HPI otherwise 14 point review of systems negative.    Past Medical History:  Diagnosis Date  . Arthritis   . BPH (benign prostatic hyperplasia)   . CAD (coronary artery disease)    s/p CABG 1991  . Carotid artery occlusion   . Cataract    b/l surgery  . Chronic kidney disease (CKD), stage III (moderate) (HCC)   . Diverticulosis   . DJD (degenerative joint disease)    . DJD (degenerative joint disease)   . GERD (gastroesophageal reflux disease)   . Glaucoma   . Hiatal hernia   . History of adenomatous polyp of colon 06/07/13   Last colonoscopy 06/27/13 showed small adenoma, no further testing due to advanced age.  [name] History of radiation therapy 07/25/2012-09/20/2012   prostate 7800 cGy 40 sessions, seminal vesicles 5600 cGy 40 sessions  . History of shingles   . Hypercholesteremia   . Hyperlipidemia   . Hypertension   . Inguinal hernia   . Osteomyelitis of forearm, right, acute ([place])    drainage x 2  . Persistent atrial fibrillation ([place])   . Prostate cancer ([place] [facility]) 04/27/12   Dr. Eulogio Ditch. Radiation therapy. Adenocarcinoma,gleason=3+4=7, 3+3=6,PSA=4.93  volume=51cc  . Pulmonary nodules    small rlung base nodule 4.14mm, scarring in lung bases  . 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 CARDIAC CATHETERIZATION  06/04/09   left w/noted patent bypass grafts  . CATARACT EXTRACTION W/ INTRAOCULAR LENS IMPLANT  2011  . COLONOSCOPY  12/21/2007   hx polyps, diverticulosis  . COLONOSCOPY WITH PROPOFOL N/A 06/27/2013   Procedure: COLONOSCOPY WITH PROPOFOL;  Surgeon: Garlan Fair, MD;  Location: [facility] ENDOSCOPY;  Service: Endoscopy;  Laterality: N/A;  . CORONARY ARTERY BYPASS GRAFT  1991   x5; prior MI  . PACEMAKER GENERATOR CHANGE  05/24/12  . PACEMAKER INSERTION  05/14/03   MDT  EnPulse implanted by Dr Leonia Reeves ddd  . PERMANENT PACEMAKER GENERATOR CHANGE N/A 05/24/2012   Procedure: PERMANENT PACEMAKER GENERATOR CHANGE;  Surgeon: Thompson Grayer, MD;  Location: [facility] CATH LAB;  Service: Cardiovascular;  Laterality: N/A;  . PROSTATE BIOPSY  04/27/12   Adenocarcinoma,gleason=3+3=6, 3+4,& 4+3,PSA=4.93,volume=51cc  . TUMOR REMOVAL     skin tumors removed     reports that he quit smoking about 39 years ago. His smoking use included  cigarettes. He has a 5.00 pack-year smoking history. He has never used smokeless tobacco. He reports that he does not drink alcohol and does not use drugs.  Allergies  Allergen Reactions  . Pilocarpine Other (See Comments)    Made him feel very funny feeling.  . Rosuvastatin Other (See Comments)    Knee and leg pain Knee and leg pain    Family History  Problem Relation Age of Onset  . Hypertension Mother   . Congestive Heart Failure Mother   . CAD Mother   . Alzheimer's disease Father   . CAD Brother   . Pancreatic cancer Brother   . CVA Brother   . Hypertension Sister        Has 6 sisters, many suffer from HTN  . Heart attack Brother   . Stroke Brother      Prior to Admission medications   Medication Sig Start Date End Date Taking? Authorizing Provider  acetaminophen (TYLENOL) 650 MG CR tablet Take 650 mg by mouth every 8 (eight) hours as needed for pain.   Yes [provider]  amLODipine (NORVASC) 5 MG tablet Take 1 tablet (5 mg total) by mouth daily. 07/10/20  Yes Belva Crome, MD  apixaban (ELIQUIS) 5 MG TABS tablet Take 5 mg by mouth 2 (two) times daily.   Yes Josetta Huddle, MD  brimonidine (ALPHAGAN) 0.2 % ophthalmic solution Place 1 drop into both eyes 3 (three) times daily.  09/27/18  Yes [provider]  Cholecalciferol (VITAMIN D3) 2000 units TABS Take 2,000 Units by mouth daily.   Yes [provider]  dorzolamide (TRUSOPT) 2 % ophthalmic solution Place 1 drop into both eyes 3 (three) times daily.  11/20/17  Yes [provider]  furosemide (LASIX) 40 MG tablet Take 2 tablets by mouth every morning.  Take one tablet by mouth every evening. Patient taking differently: Take 80 mg by mouth daily. 09/04/20  Yes Belva Crome, MD  HYDROcodone-acetaminophen (NORCO/VICODIN) 5-325 MG tablet TAKE ONE TABLET BY MOUTH EVERY 6 HOURS AS NEEDED FOR moderate pain Patient taking differently: Take 1 tablet by mouth every 6 (six) hours as needed for  moderate pain. 10/18/20  Yes Mcarthur Rossetti, MD  latanoprost (XALATAN) 0.005 % ophthalmic solution Place 1 drop into both eyes at bedtime.   Yes [provider]  Multiple Vitamin (MULTIVITAMIN) tablet Take 1 tablet by mouth daily.   Yes [provider]  nitroGLYCERIN (NITROSTAT) 0.4 MG SL tablet PLACE 1 TABLET UNDER TONGUE EVERY FIVE MINUTES AS NEEDED FOR CHEST PAIN. 12/08/19  Yes Belva Crome, MD  omeprazole (PRILOSEC) 20 MG capsule Take 20 mg by mouth daily.   Yes [provider]  potassium chloride SA (KLOR-CON M20) 20 MEQ tablet Take 1 tablet (20 mEq total) by mouth 2 (two) times daily. Patient taking differently: Take 40 mEq by mouth daily. 08/02/20  Yes Belva Crome, MD  trolamine salicylate (BLUE-EMU HEMP) 10 % cream Apply 1 application topically as needed for muscle pain.   Yes [provider]  vitamin E 400 UNIT capsule Take 400 Units by mouth daily.   Yes [provider]  meclizine (ANTIVERT) 25 MG tablet Take 1 tablet (25 mg total) by mouth 3 (three) times daily as needed for dizziness. Patient not taking: Reported on 10/19/2020 05/16/20   Domenic Moras, PA-C  traMADol Veatrice Bourbon) 50 MG tablet TAKE ONE TABLET BY MOUTH EVERY 6 HOURS AS NEEDED Patient not taking: Reported on 10/19/2020 10/11/20   Mcarthur Rossetti, MD    Physical Exam: Vitals:   10/19/20 1039 10/19/20 1215 10/19/20 1342  BP: (!) 148/84 (!) 181/117 (!) 143/73  Pulse: 83 71 80  Resp: 16 18 12   Temp: 98.2 F (36.8 C)    SpO2: 96% 98% 97%    Constitutional: NAD, calm, comfortable Vitals:   10/19/20 1039 10/19/20 1215 10/19/20 1342  BP: (!) 148/84 (!) 181/117 (!) 143/73  Pulse: 83 71 80  Resp: 16 18 12   Temp: 98.2 F (36.8 C)    SpO2: 96% 98% 97%   Eyes: PERRL, lids and conjunctivae normal ENMT: Mucous membranes are moist. Posterior pharynx clear of any exudate or lesions.Normal dentition.  Neck: normal, supple, no masses, no thyromegaly Respiratory:  clear to auscultation bilaterally, no wheezing, no crackles. Normal respiratory effort. No accessory muscle use.  Cardiovascular: Regular rate and rhythm, no murmurs / rubs / gallops. No extremity edema. 2+ pedal pulses. No carotid bruits.  Abdomen: no tenderness, no masses palpated. No hepatosplenomegaly. Bowel sounds positive.  Musculoskeletal: no clubbing / cyanosis. No joint deformity upper and lower extremities. Good ROM, no contractures. Normal muscle tone. Severe back tenderness. Skin: no rashes, lesions, ulcers. No induration Neurologic: CN 2-12 grossly intact. Sensation intact, DTR normal. Strength 5/5 in all 4.  Psychiatric: Normal judgment and insight. Alert and oriented x 3. Normal mood.     Labs on Admission: I have personally reviewed following labs and imaging studies  CBC: Recent Labs  Lab 10/19/20 1228  WBC 6.7  HGB 12.0*  HCT 38.2*  MCV 99.0  PLT 016   Basic Metabolic Panel: Recent Labs  Lab 10/19/20 1228  NA 139  K 3.0*  CL 97*  CO2 29  GLUCOSE 114*  BUN 12  CREATININE 1.15  CALCIUM 9.6   GFR: CrCl cannot be calculated (Unknown ideal weight.). Liver Function Tests: Recent Labs  Lab 10/19/20 1228  AST 23  ALT 11  ALKPHOS 95  BILITOT 1.9*  PROT 6.7  ALBUMIN 3.4*   No results for input(s): LIPASE, AMYLASE in the last 168 hours. No results for input(s): AMMONIA in the last 168 hours. Coagulation Profile: No results for input(s): INR, PROTIME in the last 168 hours. Cardiac Enzymes: No results for input(s): CKTOTAL, CKMB, CKMBINDEX, TROPONINI in the last 168 hours. BNP (last 3 results) Recent Labs    11/10/19 1038 12/08/19 1232 01/30/20 1336  PROBNP 2,698* 2,453* 2,125*   HbA1C: No results for input(s): HGBA1C in the last 72 hours. CBG: No results for input(s): GLUCAP in the last 168 hours. Lipid Profile: No results for input(s): CHOL, HDL, LDLCALC, TRIG, CHOLHDL, LDLDIRECT in the last 72 hours. Thyroid Function Tests: No results for  input(s): TSH, T4TOTAL, FREET4, T3FREE, THYROIDAB in the last 72 hours. Anemia Panel: No results for input(s): VITAMINB12, FOLATE, FERRITIN, TIBC, IRON, RETICCTPCT in the last 72 hours. Urine analysis:    Component Value Date/Time   COLORURINE YELLOW 10/19/2020 1255   APPEARANCEUR CLOUDY (A) 10/19/2020 1255   LABSPEC 1.014 10/19/2020 1255   PHURINE 7.0 10/19/2020 1255  GLUCOSEU NEGATIVE 10/19/2020 [place] 10/19/2020 [place] 10/19/2020 [place] 10/19/2020 [place] 10/19/2020 1255   NITRITE NEGATIVE 10/19/2020 [place] 10/19/2020 [place] on Admission: No results found.  EKG: A. fib on monitor  Assessment/Plan Active Problems:   Pathological fracture   Impaired ambulation  (please populate well all problems here in Problem List. (For example, if patient is on BP meds at home and you resume or decide to hold them, it is a problem that needs to be her. Same for CAD, COPD, HLD and so on)  Acute ambulation dysfunction secondary to pathological L2 fracture -Signs of Equina syndrome -Alternate oxycodone with dilaudid for now -Consult palliative care and oncology  Hypokalemia -replace and recheck, Mg level pending  HTN -On Home meds  Chronic diastolic CHF -Euvolemic, continue Lasix  History of prostate CA -Recent urology visit ruled out recurrent prostate CA. PSA remains low.  Chronic A. Fib -Continue Eliquis  DVT prophylaxis: Eliquis Code Status: Full code Family Communication: Son at bedside Disposition Plan: Expect home in 2 midnight hospital stay to titrate pain meds and PT evaluation. Consults called: Oncology Admission status: MedSurg   Lequita Halt MD [facility] Pager [phone]  10/19/2020, 2:47 PM

## 2020-10-19 NOTE — Consult Note (Addendum)
Referral MD  Reason for Referral: Pathologic fracture at L2 with severe back pain  Chief Complaint  Patient presents with  . Back Pain  : My back pain is getting worse.  HPI: Mr. Jonathan Warner is a very nice 87 year old white male.  He served in the [place] military.  He was between [place] War and [place] War.  He is a true American hero for his service.  He lives in [place].  He started to note some lower back pain around Christmas time.  This was not radiating.  He said he on occasion would fall to his knees because a pain would exacerbate.  There is no change in bowel or bladder habits.  He had no nausea or vomiting.  He has lost some weight.  He saw orthopedic surgery.  Dr. Ninfa Linden, being incredibly astute, felt that this was a bone lower back issue and not a hip issue.  He ultimately had a CT of the lumbar spine done.  He cannot have an MRI because of a pacemaker.  The CT scan was done on 10/17/2020.  There is a pathologic fracture of L2.  There is lytic destruction of much of the L2 vertebral body.  Tumor extends to the anterior pedicle on the right of L2.  There is extraosseous extension of tumor into the right paraspinous soft tissue and right psoas muscle.  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  He was given tramadol.  This was not helpful.  He was given Vicodin.  This was not helpful.  He ultimately was admitted on 26 February.  His lab work today shows white cell count of 6.7.  Hemoglobin 12.  Platelet count 184,000.  His BUN is 12 creatinine 1.15.  Calcium is 9.6 with an albumin of 3.4.  His total protein is 6.7.  His blood sugar is 114.  He says he does not have much of an appetite because of his pain.  He smoked but this was probably 40 years ago.  He has no obvious occupational exposures.  There is no rashes.  He has not noted any swollen lymph nodes.  He has had no cough.  No hemoptysis.  Overall, his performance status is ECOG  1.    Past Medical History:  Diagnosis Date  . Arthritis   . BPH (benign prostatic hyperplasia)   . CAD (coronary artery disease)    s/p CABG 1991  . Carotid artery occlusion   . Cataract    b/l surgery  . Chronic kidney disease (CKD), stage III (moderate) (HCC)   . Diverticulosis   . DJD (degenerative joint disease)   . DJD (degenerative joint disease)   . GERD (gastroesophageal reflux disease)   . Glaucoma   . Hiatal hernia   . History of adenomatous polyp of colon 06/07/13   Last colonoscopy 06/27/13 showed small adenoma, no further testing due to advanced age.  [name] History of radiation therapy 07/25/2012-09/20/2012   prostate 7800 cGy 40 sessions, seminal vesicles 5600 cGy 40 sessions  . History of shingles   . Hypercholesteremia   . Hyperlipidemia   . Hypertension   . Inguinal hernia   . Osteomyelitis of forearm, right, acute ([place])    drainage x 2  . Persistent atrial fibrillation ([place])   . Prostate cancer ([place]) 04/27/12   Dr. Eulogio Ditch. Radiation therapy. Adenocarcinoma,gleason=3+4=7, 3+3=6,PSA=4.93  volume=51cc  . Pulmonary nodules    small rlung base nodule 4.74mm, scarring in  lung bases  . 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 CARDIAC CATHETERIZATION  06/04/09   left w/noted patent bypass grafts  . CATARACT EXTRACTION W/ INTRAOCULAR LENS IMPLANT  2011  . COLONOSCOPY  12/21/2007   hx polyps, diverticulosis  . COLONOSCOPY WITH PROPOFOL N/A 06/27/2013   Procedure: COLONOSCOPY WITH PROPOFOL;  Surgeon: Garlan Fair, MD;  Location: [facility] ENDOSCOPY;  Service: Endoscopy;  Laterality: N/A;  . CORONARY ARTERY BYPASS GRAFT  1991   x5; prior MI  . PACEMAKER GENERATOR CHANGE  05/24/12  . PACEMAKER INSERTION  05/14/03   MDT EnPulse implanted by Dr Leonia Reeves ddd  . PERMANENT PACEMAKER GENERATOR CHANGE N/A 05/24/2012   Procedure: PERMANENT PACEMAKER  GENERATOR CHANGE;  Surgeon: Thompson Grayer, MD;  Location: [facility] CATH LAB;  Service: Cardiovascular;  Laterality: N/A;  . PROSTATE BIOPSY  04/27/12   Adenocarcinoma,gleason=3+3=6, 3+4,& 4+3,PSA=4.93,volume=51cc  . TUMOR REMOVAL     skin tumors removed  :   Current Facility-Administered Medications:  .  [COMPLETED] sodium chloride 0.9 % bolus 500 mL, 500 mL, Intravenous, Once, Stopped at 10/19/20 1330 **FOLLOWED BY** 0.9 %  sodium chloride infusion, 1,000 mL, Intravenous, Continuous, Dorie Rank, MD, Last Rate: 125 mL/hr at 10/19/20 1234, 1,000 mL at 10/19/20 1234 .  amLODipine (NORVASC) tablet 5 mg, 5 mg, Oral, Daily, Wynetta Fines T, MD, 5 mg at 10/19/20 1557 .  apixaban (ELIQUIS) tablet 5 mg, 5 mg, Oral, BID, Wynetta Fines T, MD, 5 mg at 10/19/20 1556 .  brimonidine (ALPHAGAN) 0.2 % ophthalmic solution 1 drop, 1 drop, Both Eyes, TID, Zhang, Pearletha Forge T, MD .  cholecalciferol (VITAMIN D3) tablet 2,000 Units, 2,000 Units, Oral, Daily, Lequita Halt, MD, 2,000 Units at 10/19/20 1556 .  dorzolamide (TRUSOPT) 2 % ophthalmic solution 1 drop, 1 drop, Both Eyes, TID, Zhang, Ping T, MD .  feeding supplement (ENSURE ENLIVE / ENSURE PLUS) liquid 237 mL, 237 mL, Oral, BID BM, Wynetta Fines T, MD, 237 mL at 10/19/20 1500 .  furosemide (LASIX) tablet 80 mg, 80 mg, Oral, Daily, Wynetta Fines T, MD, 80 mg at 10/19/20 1556 .  HYDROmorphone (DILAUDID) injection 0.5-1 mg, 0.5-1 mg, Intravenous, Q2H PRN, Wynetta Fines T, MD, 1 mg at 10/19/20 1529 .  latanoprost (XALATAN) 0.005 % ophthalmic solution 1 drop, 1 drop, Both Eyes, QHS, Zhang, Ping T, MD .  ondansetron [facility]) injection 4 mg, 4 mg, Intravenous, Q6H PRN, Wynetta Fines T, MD .  oxyCODONE (Oxy IR/ROXICODONE) immediate release tablet 5 mg, 5 mg, Oral, Q4H PRN, Wynetta Fines T, MD .  pantoprazole (PROTONIX) EC tablet 40 mg, 40 mg, Oral, Daily, Wynetta Fines T, MD, 40 mg at 10/19/20 1556 .  polyethylene glycol (MIRALAX / GLYCOLAX) packet 17 g, 17 g, Oral, Daily, Wynetta Fines T, MD, 17 g  at 10/19/20 1557 .  potassium chloride SA (KLOR-CON) CR tablet 40 mEq, 40 mEq, Oral, Daily, Wynetta Fines T, MD, 40 mEq at 10/19/20 1557:  . amLODipine  5 mg Oral Daily  . apixaban  5 mg Oral BID  . brimonidine  1 drop Both Eyes TID  . cholecalciferol  2,000 Units Oral Daily  . dorzolamide  1 drop Both Eyes TID  . feeding supplement  237 mL Oral BID BM  . furosemide  80 mg Oral Daily  . latanoprost  1 drop Both Eyes QHS  .  pantoprazole  40 mg Oral Daily  . polyethylene glycol  17 g Oral Daily  . potassium chloride SA  40 mEq Oral Daily  :  Allergies  Allergen Reactions  . Pilocarpine Other (See Comments)    Made him feel very funny feeling.  . Rosuvastatin Other (See Comments)    Knee and leg pain Knee and leg pain  :  Family History  Problem Relation Age of Onset  . Hypertension Mother   . Congestive Heart Failure Mother   . CAD Mother   . Alzheimer's disease Father   . CAD Brother   . Pancreatic cancer Brother   . CVA Brother   . Hypertension Sister        Has 6 sisters, many suffer from HTN  . Heart attack Brother   . Stroke Brother   :  Social History   Socioeconomic History  . Marital status: Married    Spouse name: Not on file  . Number of children: 1  . Years of education: Not on file  . Highest education level: Not on file  Occupational History  . Occupation: Retired    Comment: Theatre stage manager units  Tobacco Use  . Smoking status: Former Smoker    Packs/day: 0.50    Years: 10.00    Pack years: 5.00    Types: Cigarettes    Quit date: 08/24/1981    Years since quitting: 39.1  . Smokeless tobacco: Never Used  Vaping Use  . Vaping Use: Never used  Substance and Sexual Activity  . Alcohol use: No  . Drug use: No  . Sexual activity: Not on file  Other Topics Concern  . Not on file  Social History Narrative   Retired Hotel manager.  Lives in [place].   Social Determinants of Health   Financial Resource Strain: Not on file   Food Insecurity: Not on file  Transportation Needs: Not on file  Physical Activity: Not on file  Stress: Not on file  Social Connections: Not on file  Intimate Partner Violence: Not on file  :  Review of Systems  Constitutional: Positive for malaise/fatigue and weight loss.  HENT: Negative.   Eyes: Negative.   Respiratory: Negative.   Cardiovascular: Positive for palpitations.  Gastrointestinal: Negative.   Genitourinary: Negative.   Musculoskeletal: Positive for back pain.  Skin: Negative.   Neurological: Negative.   Endo/Heme/Allergies: Negative.   Psychiatric/Behavioral: Negative.      Exam:  This is an elderly white male in mild distress secondary to pain.  Vital signs show temperature of 98.2.  Pulse is 96.  Blood pressure 139/73.  There is no weight.  Head and neck exam shows no ocular or oral lesions.  There are no palpable cervical or supraclavicular lymph nodes.  Lungs are clear bilaterally.  Cardiac exam is irregular rate and rhythm consistent with atrial fibrillation.  He has no murmurs.  Abdomen is soft.  There are no palpable inguinal lymph nodes.  He has decreased bowel sounds.  There is no fluid wave.  There is no palpable liver or spleen tip.  Extremities shows decent strength in his legs.  He has no swelling in the legs.  Skin exam shows no rashes, ecchymoses or petechia.  Neurological exam is nonfocal.  Patient Vitals for the past 24 hrs:  BP Temp Temp src Pulse Resp SpO2  10/19/20 1549 139/73 98.2 F (36.8 C) Oral 70 18 96 %  10/19/20 1500 139/87 (!) 97.5 F (36.4 C) -- 86  15 94 %  10/19/20 1445 (!) 149/75 -- -- 69 15 95 %  10/19/20 1430 (!) 153/90 -- -- 75 12 99 %  10/19/20 1415 (!) 155/81 -- -- 74 12 99 %  10/19/20 1400 (!) 152/70 -- -- 80 12 97 %  10/19/20 1342 (!) 143/73 -- -- 80 12 97 %  10/19/20 1215 (!) 181/117 -- -- 71 18 98 %  10/19/20 1039 (!) 148/84 98.2 F (36.8 C) -- 83 16 96 %     Recent Labs    10/19/20 1228  WBC 6.7  HGB 12.0*   HCT 38.2*  PLT 184   Recent Labs    10/19/20 1228  NA 139  K 3.0*  CL 97*  CO2 29  GLUCOSE 114*  BUN 12  CREATININE 1.15  CALCIUM 9.6    Blood smear review: None  Pathology: None    Assessment and Plan: Mr. Czerniak is an 87 year old white male.  He has about pathologic fracture at L2.  This is very painful.  He has a para spinal mass.  I have to believe that this is going to be a solid tumor.  His PSA was normal.  As such I doubt this is prostate cancer.  I would be shocked if this was a myeloma.  It is always possible.  A plasmacytoma is certainly feasible.  However, his BUN and creatinine are normal.  There is no hypercalcemia.  His albumin is really not that low.  Ultimately, I think he is going to end up needing a biopsy.  He is on Eliquis.  We have to get him off Eliquis and onto heparin because of the atrial fibrillation.  He will clearly need radiation therapy.  I do not know if interventional radiology can do a vertebroplasty on him.  We definitely will need something to stabilize the L2 vertebral body.  I probably would consult orthopedic spinal surgery-I like Dr. Frazier Richards see if there is anything that they might be able to recommend. With surgical intervention, we could certainly get a lot of tumor specimen.    We will have to get a CT scan of his chest abdomen pelvis to evaluate for any obvious primary.  He has a remote history of smoking so lung cancer is always a possibility.  Renal cell carcinoma as well as a possibility.  Lymphoma is always possible but again I do not see any evidence of this.  Nonmalignant causes would be unusual.  He will also need a bone hardener.  I will give him a dose of Aredia while he is here.  We will certainly follow him along.  Again, "tissue is the issue."  We cannot forget that he is a true American hero for serving our country in the [employer].  He deserves our best care possible.  Lattie Haw, MD  Michaelyn Barter  6:9

## 2020-10-19 NOTE — Plan of Care (Signed)
  Problem: Education: Goal: Knowledge of General Education information will improve Description Including pain rating scale, medication(s)/side effects and non-pharmacologic comfort measures Outcome: Progressing   Problem: Health Behavior/Discharge Planning: Goal: Ability to manage health-related needs will improve Outcome: Progressing   

## 2020-10-19 NOTE — ED Triage Notes (Signed)
EMS stated, he has had back pain since Christmas and last 2 days is worse. He has a tumor on L2. On rt. Side.

## 2020-10-19 NOTE — ED Notes (Signed)
Report given to 5N 

## 2020-10-19 NOTE — Progress Notes (Signed)
ANTICOAGULATION CONSULT NOTE - Initial Consult  Pharmacy Consult for Heparin drip  Indication: atrial fibrillation  Allergies  Allergen Reactions  . Pilocarpine Other (See Comments)    Made him feel very funny feeling.  . Rosuvastatin Other (See Comments)    Knee and leg pain Knee and leg pain    Patient Measurements: Height: 5\' 8"  (172.7 cm) Weight: 70.3 kg (155 lb) IBW/kg (Calculated) : 68.4    Vital Signs: Temp: 98.2 F (36.8 C) (02/26 1549) Temp Source: Oral (02/26 1549) BP: 139/73 (02/26 1549) Pulse Rate: 70 (02/26 1549)  Labs: Recent Labs    10/19/20 1228  HGB 12.0*  HCT 38.2*  PLT 184  CREATININE 1.15    Estimated Creatinine Clearance: 44.6 mL/min (by C-G formula based on SCr of 1.15 mg/dL).   Medical History: Past Medical History:  Diagnosis Date  . Arthritis   . BPH (benign prostatic hyperplasia)   . CAD (coronary artery disease)    s/p CABG 1991  . Carotid artery occlusion   . Cataract    b/l surgery  . Chronic kidney disease (CKD), stage III (moderate) (HCC)   . Diverticulosis   . DJD (degenerative joint disease)   . DJD (degenerative joint disease)   . GERD (gastroesophageal reflux disease)   . Glaucoma   . Hiatal hernia   . History of adenomatous polyp of colon 06/07/13   Last colonoscopy 06/27/13 showed small adenoma, no further testing due to advanced age.  [name] History of radiation therapy 07/25/2012-09/20/2012   prostate 7800 cGy 40 sessions, seminal vesicles 5600 cGy 40 sessions  . History of shingles   . Hypercholesteremia   . Hyperlipidemia   . Hypertension   . Inguinal hernia   . Osteomyelitis of forearm, right, acute ([place])    drainage x 2  . Persistent atrial fibrillation ([place])   . Prostate cancer ([place]) 04/27/12   Dr. Eulogio Ditch. Radiation therapy. Adenocarcinoma,gleason=3+4=7, 3+3=6,PSA=4.93  volume=51cc  . Pulmonary nodules    small rlung base nodule 4.12mm, scarring in lung bases  . Symptomatic bradycardia    s/p PPM by Dr  Leonia Reeves 2004  . 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  On apixaban PTA for Afib plan to hold apixaban and bridge with heparin for procedure.  Last dose apixaban 3pm 2/26 will being heaprin drip at 3am. Apixaban can falsely increase heaprin level so will dose heparin with aptt until apixaban washes out.    Goal of Therapy:  Heparin level 0.3-0.7 units/ml aPTT 66-100sec  seconds Monitor platelets by anticoagulation protocol: Yes   Plan:  Stop apixaban last dose 3pm 2/26 Begin heparin drip 850 uts/hr at 3am 2/26  Drawn aptt / heparin level 6hr after drip started  Daily heparin level and appt Monitor s/s bleeding    Bonnita Nasuti Pharm.D. CPP, BCPS Clinical Pharmacist [phone] 10/19/2020 4:45 PM

## 2020-10-19 NOTE — ED Provider Notes (Addendum)
[facility] EMERGENCY DEPARTMENT Provider Note   CSN: [id number] Arrival date & time: 10/19/20  1038     History Chief Complaint  Patient presents with  . Back Pain    Jonathan Warner is a 87 y.o. male.  HPI   Patient has been having issues with back pain since December around Christmas time.  Patient states initially had a fall.  Ever since then he has been having some trouble with persistent pain in his back but also in his hip.  Patient states he was ultimately referred to an orthopedic doctor.  He saw Dr. Ninfa Linden earlier this month.  Patient had spinal x-rays that did show questionable subacute injury to the L2 vertebral body.  Plan was for treatment with tramadol for pain and reevaluation.  Dr. Ninfa Linden ordered a CT scan of his lumbar spine.  Patient was not able to get an MRI.   The CT scan ended up showing a lesion in the spine concerning for a pathologic fracture of L2.  Metastatic disease was also concerned.  Patient was referred to Dr. Lenon Curt and the patient has an appointment on March 3.  Patient started having increasing pain over the weekend despite pain medications.  Pain is primarily in his lower back.  He does feel some discomfort in his lower abdomen.  Has not had any trouble with vomiting or diarrhea.  He has not been eating or drinking well.  He has had weight loss.  Past Medical History:  Diagnosis Date  . Arthritis   . BPH (benign prostatic hyperplasia)   . CAD (coronary artery disease)    s/p CABG 1991  . Carotid artery occlusion   . Cataract    b/l surgery  . Chronic kidney disease (CKD), stage III (moderate) (HCC)   . Diverticulosis   . DJD (degenerative joint disease)   . DJD (degenerative joint disease)   . GERD (gastroesophageal reflux disease)   . Glaucoma   . Hiatal hernia   . History of adenomatous polyp of colon 06/07/13   Last colonoscopy 06/27/13 showed small adenoma, no further testing due to advanced age.  [name] History of radiation  therapy 07/25/2012-09/20/2012   prostate 7800 cGy 40 sessions, seminal vesicles 5600 cGy 40 sessions  . History of shingles   . Hypercholesteremia   . Hyperlipidemia   . Hypertension   . Inguinal hernia   . Osteomyelitis of forearm, right, acute ([place])    drainage x 2  . Persistent atrial fibrillation ([place])   . Prostate cancer ([place]) 04/27/12   Dr. Eulogio Ditch. Radiation therapy. Adenocarcinoma,gleason=3+4=7, 3+3=6,PSA=4.93  volume=51cc  . Pulmonary nodules    small rlung base nodule 4.60mm, scarring in lung bases  . 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 Unilateral primary osteoarthritis, left knee 04/17/2020  . Unilateral primary osteoarthritis, right knee 04/17/2020  . Coronary artery disease involving native coronary artery of native heart with angina pectoris ([place]) 11/06/2016  . Hypertensive heart and chronic kidney disease with heart failure and stage 1 through stage 4 chronic kidney disease, or chronic kidney disease ([place]) 11/06/2016  . Chronic anticoagulation 12/01/2013  . History of radiation therapy   . Prostate cancer ([place]) 05/31/2012  . Bradycardia 05/23/2012  . Atrial fibrillation ([place]) 05/23/2012    Past Surgical History:  Procedure Laterality Date  . CARDIAC  CATHETERIZATION  06/04/09   left w/noted patent bypass grafts  . CATARACT EXTRACTION W/ INTRAOCULAR LENS IMPLANT  2011  . COLONOSCOPY  12/21/2007   hx polyps, diverticulosis  . COLONOSCOPY WITH PROPOFOL N/A 06/27/2013   Procedure: COLONOSCOPY WITH PROPOFOL;  Surgeon: Garlan Fair, MD;  Location: [facility] ENDOSCOPY;  Service: Endoscopy;  Laterality: N/A;  . CORONARY ARTERY BYPASS GRAFT  1991   x5; prior MI  . PACEMAKER GENERATOR CHANGE  05/24/12  . PACEMAKER INSERTION  05/14/03   MDT EnPulse implanted by Dr Leonia Reeves ddd  . PERMANENT PACEMAKER GENERATOR CHANGE N/A 05/24/2012   Procedure:  PERMANENT PACEMAKER GENERATOR CHANGE;  Surgeon: Thompson Grayer, MD;  Location: [facility] CATH LAB;  Service: Cardiovascular;  Laterality: N/A;  . PROSTATE BIOPSY  04/27/12   Adenocarcinoma,gleason=3+3=6, 3+4,& 4+3,PSA=4.93,volume=51cc  . TUMOR REMOVAL     skin tumors removed       Family History  Problem Relation Age of Onset  . Hypertension Mother   . Congestive Heart Failure Mother   . CAD Mother   . Alzheimer's disease Father   . CAD Brother   . Pancreatic cancer Brother   . CVA Brother   . Hypertension Sister        Has 6 sisters, many suffer from HTN  . Heart attack Brother   . Stroke Brother     Social History   Tobacco Use  . Smoking status: Former Smoker    Packs/day: 0.50    Years: 10.00    Pack years: 5.00    Types: Cigarettes    Quit date: 08/24/1981    Years since quitting: 39.1  . Smokeless tobacco: Never Used  Vaping Use  . Vaping Use: Never used  Substance Use Topics  . Alcohol use: No  . Drug use: No    Home Medications Prior to Admission medications   Medication Sig Start Date End Date Taking? Authorizing Provider  acetaminophen (TYLENOL) 500 MG tablet Take 1,000 mg by mouth every 6 (six) hours as needed for pain.     [provider]  amLODipine (NORVASC) 5 MG tablet Take 1 tablet (5 mg total) by mouth daily. 07/10/20   Belva Crome, MD  apixaban (ELIQUIS) 5 MG TABS tablet Take 5 mg by mouth 2 (two) times daily.    Josetta Huddle, MD  brimonidine [facility]) 0.2 % ophthalmic solution Place 1 drop into both eyes 3 (three) times daily.  09/27/18   [provider]  Cholecalciferol (VITAMIN D3) 2000 units TABS Take 1 tablet by mouth daily.     [provider]  dorzolamide (TRUSOPT) 2 % ophthalmic solution Place 1 drop into both eyes 3 (three) times daily.  11/20/17   [provider]  furosemide (LASIX) 40 MG tablet Take 2 tablets by mouth every morning.  Take one tablet by mouth every evening. 09/04/20   Belva Crome, MD   HYDROcodone-acetaminophen (NORCO/VICODIN) 5-325 MG tablet TAKE ONE TABLET BY MOUTH EVERY 6 HOURS AS NEEDED FOR moderate pain 10/18/20   Mcarthur Rossetti, MD  latanoprost (XALATAN) 0.005 % ophthalmic solution Apply 1 drop to eye at bedtime.     [provider]  meclizine (ANTIVERT) 25 MG tablet Take 1 tablet (25 mg total) by mouth 3 (three) times daily as needed for dizziness. 05/16/20   Domenic Moras, PA-C  Multiple Vitamin (MULTIVITAMIN) tablet Take 1 tablet by mouth daily.    [provider]  nitroGLYCERIN (NITROSTAT) 0.4 MG SL tablet PLACE 1 TABLET UNDER TONGUE EVERY  FIVE MINUTES AS NEEDED FOR CHEST PAIN. 12/08/19   Belva Crome, MD  omeprazole (PRILOSEC) 20 MG capsule Take 20 mg by mouth daily.    [provider]  potassium chloride SA (KLOR-CON M20) 20 MEQ tablet Take 1 tablet (20 mEq total) by mouth 2 (two) times daily. 08/02/20   Belva Crome, MD  traMADol Veatrice Bourbon) 50 MG tablet TAKE ONE TABLET BY MOUTH EVERY 6 HOURS AS NEEDED 10/11/20   Mcarthur Rossetti, MD  vitamin E 400 UNIT capsule Take 400 Units by mouth daily.    [provider]    Allergies    Pilocarpine and Rosuvastatin  Review of Systems   Review of Systems  All other systems reviewed and are negative.   Physical Exam Updated Vital Signs BP (!) 143/73   Pulse 80   Temp 98.2 F (36.8 C)   Resp 12   SpO2 97%   Physical Exam Vitals and nursing note reviewed.  Constitutional:      Appearance: He is well-developed and well-nourished. He is ill-appearing.     Comments: Elderly, frail  HENT:     Head: Normocephalic and atraumatic.     Right Ear: External ear normal.     Left Ear: External ear normal.  Eyes:     General: No scleral icterus.       Right eye: No discharge.        Left eye: No discharge.     Conjunctiva/sclera: Conjunctivae normal.  Neck:     Trachea: No tracheal deviation.  Cardiovascular:     Rate and Rhythm: Normal rate and regular rhythm.      Pulses: Intact distal pulses.  Pulmonary:     Effort: Pulmonary effort is normal. No respiratory distress.     Breath sounds: Normal breath sounds. No stridor. No wheezing or rales.  Abdominal:     General: Bowel sounds are normal. There is no distension.     Palpations: Abdomen is soft.     Tenderness: There is no abdominal tenderness. There is no guarding or rebound.  Musculoskeletal:        General: No tenderness or edema.     Cervical back: Neck supple.     Comments: Mild tenderness lumbar spine region, limited range of motion, movement causes pain  Skin:    General: Skin is warm and dry.     Findings: No rash.  Neurological:     General: No focal deficit present.     Mental Status: He is alert.     Cranial Nerves: No cranial nerve deficit (no facial droop, extraocular movements intact, no slurred speech).     Sensory: No sensory deficit.     Motor: No abnormal muscle tone or seizure activity.     Coordination: Coordination normal.     Deep Tendon Reflexes: Strength normal.     Comments: Good plantar strength bilaterally, normal sensation lower extremities, normal sensation and strength upper extremities  Psychiatric:        Mood and Affect: Mood and affect normal.     ED Results / Procedures / Treatments   Labs (all labs ordered are listed, but only abnormal results are displayed) Labs Reviewed  CBC - Abnormal; Notable for the following components:      Result Value   RBC 3.86 (*)    Hemoglobin 12.0 (*)    HCT 38.2 (*)    All other components within normal limits  URINALYSIS, ROUTINE W REFLEX MICROSCOPIC - Abnormal; Notable  for the following components:   APPearance CLOUDY (*)    All other components within normal limits  COMPREHENSIVE METABOLIC PANEL - Abnormal; Notable for the following components:   Potassium 3.0 (*)    Chloride 97 (*)    Glucose, Bld 114 (*)    Albumin 3.4 (*)    Total Bilirubin 1.9 (*)    All other components within normal limits  SARS  CORONAVIRUS 2 (TAT 6-24 HRS)  PSA    EKG None  Radiology No results found.  Procedures Procedures   Medications Ordered in ED Medications  sodium chloride 0.9 % bolus 500 mL (0 mLs Intravenous Stopped 10/19/20 1330)    Followed by  0.9 %  sodium chloride infusion (1,000 mLs Intravenous New Bag/Given 10/19/20 1234)  HYDROmorphone (DILAUDID) injection 0.5 mg (0.5 mg Intravenous Given 10/19/20 1225)  potassium chloride SA (KLOR-CON) CR tablet 40 mEq (40 mEq Oral Given 10/19/20 1343)    ED Course  I have reviewed the triage vital signs and the nursing notes.  Pertinent labs & imaging results that were available during my care of the patient were reviewed by me and considered in my medical decision making (see chart for details).  Clinical Course as of 10/19/20 1416  Sat Oct 19, 2020  1329 Labs reviewed. Urinalysis not suggestive of infection. Metabolic panel notable for elevation in bilirubin and hypokalemia. [JK]  1329 CBC shows mild anemia [JK]  1341 Patient is feeling better at rest now but any movement still causes significant pain [JK]  1410 Discussed with Dr Roosevelt Locks [JK]  1415 Discussed with Dr Marin Olp.  Will consult on pt in the hospital [JK]    Clinical Course User Index [JK] Dorie Rank, MD   MDM Rules/Calculators/A&P                          Patient presented with worsening back pain.  Patient had recent outpatient CT scan that showed a pathologic L2 fracture.  Symptoms concerning for primary lesion versus metastatic lesion.  Patient was referred to oncology but his symptoms got significantly worse despite taking hydrocodone at home.  Patient has noticed some improvement with pain management here in the ED.  However he still has significant pain with any movement.  I will consult the medical service for admission for pain management.  I will also consult with the oncology service to see if we could at their recommendations regarding further evaluation and testing.  Final  Clinical Impression(s) / ED Diagnoses Final diagnoses:  Pathological compression fracture of vertebra, initial encounter [facility])          Dorie Rank, MD 10/19/20 1416

## 2020-10-20 ENCOUNTER — Inpatient Hospital Stay (HOSPITAL_COMMUNITY): Payer: Medicare Other

## 2020-10-20 DIAGNOSIS — Z7189 Other specified counseling: Secondary | ICD-10-CM | POA: Diagnosis not present

## 2020-10-20 DIAGNOSIS — M549 Dorsalgia, unspecified: Secondary | ICD-10-CM | POA: Diagnosis not present

## 2020-10-20 DIAGNOSIS — Z515 Encounter for palliative care: Secondary | ICD-10-CM | POA: Diagnosis not present

## 2020-10-20 DIAGNOSIS — C7951 Secondary malignant neoplasm of bone: Secondary | ICD-10-CM | POA: Diagnosis not present

## 2020-10-20 DIAGNOSIS — Z7952 Long term (current) use of systemic steroids: Secondary | ICD-10-CM

## 2020-10-20 DIAGNOSIS — R739 Hyperglycemia, unspecified: Secondary | ICD-10-CM

## 2020-10-20 DIAGNOSIS — R634 Abnormal weight loss: Secondary | ICD-10-CM | POA: Diagnosis not present

## 2020-10-20 DIAGNOSIS — Z87891 Personal history of nicotine dependence: Secondary | ICD-10-CM | POA: Diagnosis not present

## 2020-10-20 DIAGNOSIS — R918 Other nonspecific abnormal finding of lung field: Secondary | ICD-10-CM

## 2020-10-20 DIAGNOSIS — M8458XA Pathological fracture in neoplastic disease, other specified site, initial encounter for fracture: Secondary | ICD-10-CM

## 2020-10-20 DIAGNOSIS — R262 Difficulty in walking, not elsewhere classified: Secondary | ICD-10-CM | POA: Diagnosis not present

## 2020-10-20 DIAGNOSIS — M899 Disorder of bone, unspecified: Secondary | ICD-10-CM | POA: Diagnosis not present

## 2020-10-20 DIAGNOSIS — C3490 Malignant neoplasm of unspecified part of unspecified bronchus or lung: Secondary | ICD-10-CM

## 2020-10-20 LAB — CBC
HCT: 34.5 % — ABNORMAL LOW (ref 39.0–52.0)
Hemoglobin: 11.5 g/dL — ABNORMAL LOW (ref 13.0–17.0)
MCH: 32.1 pg (ref 26.0–34.0)
MCHC: 33.3 g/dL (ref 30.0–36.0)
MCV: 96.4 fL (ref 80.0–100.0)
Platelets: 185 10*3/uL (ref 150–400)
RBC: 3.58 MIL/uL — ABNORMAL LOW (ref 4.22–5.81)
RDW: 13.9 % (ref 11.5–15.5)
WBC: 3.3 10*3/uL — ABNORMAL LOW (ref 4.0–10.5)
nRBC: 0 % (ref 0.0–0.2)

## 2020-10-20 LAB — BASIC METABOLIC PANEL
Anion gap: 13 (ref 5–15)
BUN: 12 mg/dL (ref 8–23)
CO2: 28 mmol/L (ref 22–32)
Calcium: 9.7 mg/dL (ref 8.9–10.3)
Chloride: 97 mmol/L — ABNORMAL LOW (ref 98–111)
Creatinine, Ser: 1.09 mg/dL (ref 0.61–1.24)
GFR, Estimated: >60 mL/min (ref >60)
Glucose, Bld: 164 mg/dL — ABNORMAL HIGH (ref 70–99)
Potassium: 4.5 mmol/L (ref 3.5–5.1)
Sodium: 138 mmol/L (ref 135–145)

## 2020-10-20 LAB — APTT
aPTT: 56 seconds — ABNORMAL HIGH (ref 24–36)
aPTT: 61 seconds — ABNORMAL HIGH (ref 24–36)

## 2020-10-20 LAB — LACTATE DEHYDROGENASE: LDH: 200 U/L — ABNORMAL HIGH (ref 98–192)

## 2020-10-20 LAB — HEPARIN LEVEL (UNFRACTIONATED): Heparin Unfractionated: 1.14 IU/mL — ABNORMAL HIGH (ref 0.30–0.70)

## 2020-10-20 MED ORDER — OXYCODONE HCL 5 MG PO TABS
2.5000 mg | ORAL_TABLET | Freq: Three times a day (TID) | ORAL | Status: DC
Start: 2020-10-20 — End: 2020-10-21
  Administered 2020-10-20 – 2020-10-21 (×4): 2.5 mg via ORAL
  Filled 2020-10-20 (×4): qty 1

## 2020-10-20 MED ORDER — SENNA 8.6 MG PO TABS
1.0000 | ORAL_TABLET | Freq: Two times a day (BID) | ORAL | Status: DC
  Administered 2020-10-20 – 2020-10-23 (×7): 8.6 mg via ORAL
  Filled 2020-10-20 (×7): qty 1

## 2020-10-20 MED ORDER — IOHEXOL 300 MG/ML  SOLN
100.0000 mL | Freq: Once | INTRAMUSCULAR | Status: AC | PRN
  Administered 2020-10-20: 100 mL via INTRAVENOUS

## 2020-10-20 MED ORDER — IOHEXOL 300 MG/ML  SOLN
75.0000 mL | Freq: Once | INTRAMUSCULAR | Status: AC | PRN
  Administered 2020-10-20: 75 mL via INTRAVENOUS

## 2020-10-20 MED ORDER — ACETAMINOPHEN 500 MG PO TABS
500.0000 mg | ORAL_TABLET | Freq: Three times a day (TID) | ORAL | Status: DC
  Administered 2020-10-20 – 2020-10-23 (×8): 500 mg via ORAL
  Filled 2020-10-20 (×8): qty 1

## 2020-10-20 MED ORDER — LIDOCAINE 5 % EX PTCH
1.0000 | MEDICATED_PATCH | CUTANEOUS | Status: DC
  Administered 2020-10-20 – 2020-10-31 (×11): 1 via TRANSDERMAL
  Filled 2020-10-20 (×13): qty 1

## 2020-10-20 MED ORDER — OXYCODONE HCL 5 MG PO TABS
2.5000 mg | ORAL_TABLET | ORAL | Status: DC | PRN
  Administered 2020-10-20: 2.5 mg via ORAL
  Filled 2020-10-20: qty 1

## 2020-10-20 MED ORDER — MAGNESIUM OXIDE 400 (241.3 MG) MG PO TABS
400.0000 mg | ORAL_TABLET | Freq: Two times a day (BID) | ORAL | Status: DC
  Administered 2020-10-20 – 2020-10-31 (×22): 400 mg via ORAL
  Filled 2020-10-20 (×21): qty 1

## 2020-10-20 NOTE — Progress Notes (Signed)
I think we now have the answer as to what the malignancy is.  I do suspect this is going to be lung cancer.  He has a mass in the lung with some other smaller masses.  The main mass is in the right upper lobe.  Extends to the hilum.  It measures 4.7 x 4.5 cm.  A second mass in the right lower lobe measures 2.6 x 4.5 cm.  There is a infiltrative mass encasing the right mainstem bronchus.  There is no liver metastasis.  There is no renal lesions.  I think that we need to get pulmonary involved to see if they can do a bronchoscopy and maybe we can get a biopsy this way.  I do not know if interventional radiology can do a percutaneous biopsy of the right upper lobe mass.  I probably would also have to check a CT of the brain to make sure he has no tumors up in the brain.  His labs look okay.  His blood sugar is up a little bit because of the steroids that he is on.  Again, I have to believe we are looking at a primary lung cancer.  I know he has not smoked for a long time.  We will be necessary to get enough tumor specimen so that we can send off molecular markers to see if he has a genetic mutation that we might be able to utilize targeted therapy.  Now that we have a good idea as to the primary, please get radiation oncology involved so they can plan for radiation.  I do not see any obvious role for surgery to his back.  Again I am sure he needs to have some kind of stabilization.  I will know if IR can do a vertebroplasty for this, lesion.  He might need some kind of brace for his back.  Again, this may be where orthopedic surgery can help [place] out in making a recommendation.  Lattie Haw, MD  Psalm 34:6

## 2020-10-20 NOTE — Plan of Care (Signed)

## 2020-10-20 NOTE — Progress Notes (Signed)
Patient transported to CT via bed by transporter at 1000 on 10/20/20.

## 2020-10-20 NOTE — Progress Notes (Signed)
ANTICOAGULATION CONSULT NOTE - Initial Consult  Pharmacy Consult for Heparin drip  Indication: atrial fibrillation  Allergies  Allergen Reactions  . Pilocarpine Other (See Comments)    Made him feel very funny feeling.  . Rosuvastatin Other (See Comments)    Knee and leg pain Knee and leg pain    Patient Measurements: Height: 5\' 8"  (172.7 cm) Weight: 70.3 kg (155 lb) IBW/kg (Calculated) : 68.4  Heparin dosing weight: 70 kg  Vital Signs: Temp: 98.1 F (36.7 C) (02/27 0300) Temp Source: Oral (02/27 0300) BP: 124/68 (02/27 0300) Pulse Rate: 74 (02/27 0300)  Labs: Recent Labs    10/19/20 1228 10/20/20 0556 10/20/20 0838  HGB 12.0* 11.5*  --   HCT 38.2* 34.5*  --   PLT 184 185  --   APTT  --   --  56*  CREATININE 1.15 1.09  --     Estimated Creatinine Clearance: 47.1 mL/min (by C-G formula based on SCr of 1.09 mg/dL).   Medical History: Past Medical History:  Diagnosis Date  . Arthritis   . BPH (benign prostatic hyperplasia)   . CAD (coronary artery disease)    s/p CABG 1991  . Carotid artery occlusion   . Cataract    b/l surgery  . Chronic kidney disease (CKD), stage III (moderate) (HCC)   . Diverticulosis   . DJD (degenerative joint disease)   . DJD (degenerative joint disease)   . GERD (gastroesophageal reflux disease)   . Glaucoma   . Hiatal hernia   . History of adenomatous polyp of colon 06/07/13   Last colonoscopy 06/27/13 showed small adenoma, no further testing due to advanced age.  [name] History of radiation therapy 07/25/2012-09/20/2012   prostate 7800 cGy 40 sessions, seminal vesicles 5600 cGy 40 sessions  . History of shingles   . Hypercholesteremia   . Hyperlipidemia   . Hypertension   . Inguinal hernia   . Osteomyelitis of forearm, right, acute ([place])    drainage x 2  . Persistent atrial fibrillation ([place])   . Prostate cancer ([place]) 04/27/12   Dr. Eulogio Ditch. Radiation therapy. Adenocarcinoma,gleason=3+4=7, 3+3=6,PSA=4.93  volume=51cc  .  Pulmonary nodules    small rlung base nodule 4.27mm, scarring in lung bases  . 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  On apixaban PTA for Afib plan to hold apixaban and bridge with heparin for procedure.  Last dose apixaban 3pm 2/26; heparin infusion started ~0300.   Apixaban can falsely increase heparin level so will dose heparin with aptt until apixaban washes out. Initial aPTT SUBtherapeutic at 56 sec this AM; will increase heparin rate to 1000 u/hr. No signs of bleeding or infusion interruptions per nursing today.  Goal of Therapy:  Heparin level 0.3-0.7 units/ml aPTT 66-100sec  seconds Monitor platelets by anticoagulation protocol: Yes   Plan:  Incrase heparin drip to 1000 u/hr  Check 6-hour aptt / heparin level Daily heparin level and appt Monitor s/s bleeding   Alfonse Spruce, PharmD PGY2 ID Pharmacy Resident Phone between 7 am - 3:30 pm: [phone]  Please check AMION for all [place] phone numbers After 10:00 PM, call [place] [phone]  10/20/2020 9:08 AM

## 2020-10-20 NOTE — Progress Notes (Addendum)
ANTICOAGULATION CONSULT NOTE - Follow Up Consult  Pharmacy Consult for IV Heparin Indication: atrial fibrillation  Allergies  Allergen Reactions  . Pilocarpine Other (See Comments)    Made him feel very funny feeling.  . Rosuvastatin Other (See Comments)    Knee and leg pain Knee and leg pain    Patient Measurements: Height: 5\' 8"  (172.7 cm) Weight: 70.3 kg (155 lb) IBW/kg (Calculated) : 68.4  Heparin dosing weight: 70 kg  Vital Signs: Temp: 98.5 F (36.9 C) (02/27 1300) Temp Source: Oral (02/27 1300) BP: 155/61 (02/27 1300) Pulse Rate: 81 (02/27 1300)  Labs: Recent Labs    10/19/20 1228 10/20/20 0556 10/20/20 0838 10/20/20 1621  HGB 12.0* 11.5*  --   --   HCT 38.2* 34.5*  --   --   PLT 184 185  --   --   APTT  --   --  56* 61*  HEPARINUNFRC  --   --  1.14*  --   CREATININE 1.15 1.09  --   --     Estimated Creatinine Clearance: 47.1 mL/min (by C-G formula based on SCr of 1.09 mg/dL).   Medical History: Past Medical History:  Diagnosis Date  . Arthritis   . BPH (benign prostatic hyperplasia)   . CAD (coronary artery disease)    s/p CABG 1991  . Carotid artery occlusion   . Cataract    b/l surgery  . Chronic kidney disease (CKD), stage III (moderate) (HCC)   . Diverticulosis   . DJD (degenerative joint disease)   . DJD (degenerative joint disease)   . GERD (gastroesophageal reflux disease)   . Glaucoma   . Hiatal hernia   . History of adenomatous polyp of colon 06/07/13   Last colonoscopy 06/27/13 showed small adenoma, no further testing due to advanced age.  [name] History of radiation therapy 07/25/2012-09/20/2012   prostate 7800 cGy 40 sessions, seminal vesicles 5600 cGy 40 sessions  . History of shingles   . Hypercholesteremia   . Hyperlipidemia   . Hypertension   . Inguinal hernia   . Osteomyelitis of forearm, right, acute ([place])    drainage x 2  . Persistent atrial fibrillation ([place])   . Prostate cancer ([place]) 04/27/12   Dr. Eulogio Ditch. Radiation  therapy. Adenocarcinoma,gleason=3+4=7, 3+3=6,PSA=4.93  volume=51cc  . Pulmonary nodules    small rlung base nodule 4.55mm, scarring in lung bases  . 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  Pt was on apixaban PTA for Afib, with plan to hold apixaban and bridge with heparin for procedure (last dose of apixaban was at 1500 on 2/26).  Apixaban can falsely increase heparin level, so will monitor anticoagulation with aPTT until aPTT and heparin levels correlate.   aPTT ~7 hrs after heparin infusion was increased to 1000 units/hr was 61 sec, which is below the goal range for this pt. H/H 11.5/34.5, plt 185. Per RN, no issues with IV or bleeding observed.  Goal of Therapy:  aPTT:  66-102 sec Heparin level:  0.3-0.7 units/ml Monitor platelets by anticoagulation protocol: Yes   Plan:  Increase heparin infusion to 1150 units/hr  Check aPTT and heparin level in 8 hrs Monitor daily aPTT, heparin level, CBC Monitor for bleeding  Gillermina Hu, PharmD, BCPS,  FCCP Clinical Pharmacist 10/20/2020 5:07 PM

## 2020-10-20 NOTE — Plan of Care (Signed)

## 2020-10-20 NOTE — Progress Notes (Signed)
Patient to CT.

## 2020-10-20 NOTE — Progress Notes (Signed)
PROGRESS NOTE    Jonathan Warner  YCX:[id number] DOB: 26-Sep-1931 DOA: 10/19/2020 PCP: Josetta Huddle, MD    Brief Narrative:  87 year old gentleman with history of prostate cancer status post surgical removal and radiation, coronary artery disease status post CABG, chronic A. fib on Eliquis, tachybradycardia syndrome status post permanent pacemaker, chronic diastolic heart failure who was recently diagnosed with metastatic L2 compression fracture brought to the hospital with severe pain and difficult to move around.  Patient reported he fell around Christmas time and has been having pain since then.  He has been following with orthopedics as outpatient and was using brace without much help.   Assessment & Plan:   Active Problems:   Pathological fracture   Impaired ambulation  L2 pathological fracture, neurologically stable. Unlikely prostate cancer, normal PSA CT scan chest abdomen pelvis consistent with prominent lung tumor on the right side. CTscan of the head today with and without contrast.  Pacemaker is not MRI safe. Called and discussed case with pulmonary and critical care for bronchoscopy. Followed by oncology. After bronc and biopsy, he will probably benefit with radiation to his back.  Will need to consult radiation oncology Monday. Adequate pain medications with IV and oral opiates along with laxatives. Fracture is stable, continue mobilization. High-dose dexamethasone is started for pain and swelling.  Hypertension: Stable on home medications.  Chronic diastolic heart failure: Euvolemic on current doses of Lasix.  Chronic A. Fib, tachybradycardia syndrome status post pacemaker: Rate controlled.  Paced rhythm.  Not on any rate control medications. On Eliquis at home, last dose on 2/26 [place] with heparin periprocedural in the hospital.   DVT prophylaxis: Heparin drip   Code Status: Full code Family Communication: None.  Patient communicating. Disposition Plan:  Status is: Inpatient  Remains inpatient appropriate because:IV treatments appropriate due to intensity of illness or inability to take PO and Inpatient level of care appropriate due to severity of illness   Dispo: The patient is from: Home              Anticipated d/c is to: Home              Patient currently is not medically stable to d/c.   Difficult to place patient No         Consultants:   Oncology  Palliative  PCCM  Procedures:   None  Antimicrobials:   None   Subjective: Patient seen and examined in the morning rounds.  He was not aware about lung findings on his CT scan.  Patient complained of lower back pain and IV pain medications helping it. We discussed about findings on the CT scan and probable diagnosis of metastatic lung cancer. Patient took conversation very well and he is looking forward to have a tissue diagnosis.  Objective: Vitals:   10/19/20 1500 10/19/20 1549 10/19/20 2000 10/20/20 0300  BP: 139/87 139/73 135/77 124/68  Pulse: 86 70 75 74  Resp: 15 18 17 16   Temp: (!) 97.5 F (36.4 C) 98.2 F (36.8 C) 98 F (36.7 C) 98.1 F (36.7 C)  TempSrc:  Oral Oral Oral  SpO2: 94% 96% 97% 98%  Weight:  70.3 kg    Height:  5\' 8"  (1.727 m)      Intake/Output Summary (Last 24 hours) at 10/20/2020 1123 Last data filed at 10/19/2020 2300 Gross per 24 hour  Intake 1052 ml  Output 850 ml  Net 202 ml   Filed Weights   10/19/20 1549  Weight: 70.3 kg  Examination:  General exam: Appears calm and comfortable  Mildly anxious.  Not in any distress. Patient is alert oriented x3.  He has equal strength on all extremities. Respiratory system: Clear to auscultation. Respiratory effort normal. Cardiovascular system: S1 & S2 heard, patient has a pacemaker in place.   Gastrointestinal system: Abdomen is nondistended, soft and nontender. No organomegaly or masses felt. Normal bowel sounds heard. Central nervous system: Alert and oriented. No focal  neurological deficits.     Data Reviewed: I have personally reviewed following labs and imaging studies  CBC: Recent Labs  Lab 10/19/20 1228 10/20/20 0556  WBC 6.7 3.3*  HGB 12.0* 11.5*  HCT 38.2* 34.5*  MCV 99.0 96.4  PLT 184 062   Basic Metabolic Panel: Recent Labs  Lab 10/19/20 1228 10/20/20 0556  NA 139 138  K 3.0* 4.5  CL 97* 97*  CO2 29 28  GLUCOSE 114* 164*  BUN 12 12  CREATININE 1.15 1.09  CALCIUM 9.6 9.7  MG 1.6*  --    GFR: Estimated Creatinine Clearance: 47.1 mL/min (by C-G formula based on SCr of 1.09 mg/dL). Liver Function Tests: Recent Labs  Lab 10/19/20 1228  AST 23  ALT 11  ALKPHOS 95  BILITOT 1.9*  PROT 6.7  ALBUMIN 3.4*   No results for input(s): LIPASE, AMYLASE in the last 168 hours. No results for input(s): AMMONIA in the last 168 hours. Coagulation Profile: No results for input(s): INR, PROTIME in the last 168 hours. Cardiac Enzymes: No results for input(s): CKTOTAL, CKMB, CKMBINDEX, TROPONINI in the last 168 hours. BNP (last 3 results) Recent Labs    11/10/19 1038 12/08/19 1232 01/30/20 1336  PROBNP 2,698* 2,453* 2,125*   HbA1C: No results for input(s): HGBA1C in the last 72 hours. CBG: No results for input(s): GLUCAP in the last 168 hours. Lipid Profile: No results for input(s): CHOL, HDL, LDLCALC, TRIG, CHOLHDL, LDLDIRECT in the last 72 hours. Thyroid Function Tests: No results for input(s): TSH, T4TOTAL, FREET4, T3FREE, THYROIDAB in the last 72 hours. Anemia Panel: No results for input(s): VITAMINB12, FOLATE, FERRITIN, TIBC, IRON, RETICCTPCT in the last 72 hours. Sepsis Labs: No results for input(s): PROCALCITON, LATICACIDVEN in the last 168 hours.  Recent Results (from the past 240 hour(s))  SARS CORONAVIRUS 2 (TAT 6-24 HRS) Nasopharyngeal Nasopharyngeal Swab     Status: None   Collection Time: 10/19/20  1:47 PM   Specimen: Nasopharyngeal Swab  Result Value Ref Range Status   SARS Coronavirus 2 NEGATIVE NEGATIVE  Final    Comment: (NOTE) SARS-CoV-2 target nucleic acids are NOT DETECTED.  The SARS-CoV-2 RNA is generally detectable in upper and lower respiratory specimens during the acute phase of infection. Negative results do not preclude SARS-CoV-2 infection, do not rule out co-infections with other pathogens, and should not be used as the sole basis for treatment or other patient management decisions. Negative results must be combined with clinical observations, patient history, and epidemiological information. The expected result is Negative.  Fact Sheet for Patients: SugarRoll.be  Fact Sheet for Healthcare Providers: https://www.woods-mathews.com/  This test is not yet approved or cleared by the [place] FDA and  has been authorized for detection and/or diagnosis of SARS-CoV-2 by FDA under an Emergency Use Authorization (EUA). This EUA will remain  in effect (meaning this test can be used) for the duration of the COVID-19 declaration under Se ction 564(b)(1) of the Act, 21 U.S.C. section 360bbb-3(b)(1), unless the authorization is terminated or revoked sooner.  Performed at [facility]  [facility], [place] [address] [place], [place] [address]          Radiology Studies: CT HEAD W & WO CONTRAST  Result Date: 10/20/2020 CLINICAL DATA:  87 year old male with evidence of metastatic disease on recent CT Chest, Abdomen, and Pelvis. Unable to have MRI. EXAM: CT HEAD WITHOUT AND WITH CONTRAST TECHNIQUE: Contiguous axial images were obtained from the base of the skull through the vertex without and with intravenous contrast CONTRAST:  69mL OMNIPAQUE IOHEXOL 300 MG/ML  SOLN COMPARISON:  Plain head CT 05/16/2020. FINDINGS: Brain: Stable cerebral volume. No midline shift, ventriculomegaly, mass effect, evidence of mass lesion, intracranial hemorrhage or evidence of cortically based acute infarction. Stable gray-white matter differentiation throughout the  brain. No evidence of vasogenic edema. Small chronic lacunar infarcts suspected in the left caudate (series 5, image 30) and stable. No abnormal enhancement identified. Vascular: Mild Calcified atherosclerosis at the skull base. The major intracranial vascular structures are enhancing as expected. Skull: Stable since September. Small circumscribed sclerotic area in the left parietal bone (series 4, image 68) is probably a benign bone island. No other suspicious osseous lesion. Anterior C1-C2 degeneration incidentally noted. Sinuses/Orbits: Visualized paranasal sinuses and mastoids are stable and well pneumatized. Other: No acute orbit or scalp soft tissue finding. IMPRESSION: No metastatic disease or acute intracranial abnormality identified. Stable Head CT since September. Electronically Signed   By: Genevie Ann M.D.   On: 10/20/2020 10:32   CT CHEST ABDOMEN PELVIS W CONTRAST  Result Date: 10/20/2020 CLINICAL DATA:  Low back pain, L2 pathologic fracture. EXAM: CT CHEST, ABDOMEN, AND PELVIS WITH CONTRAST TECHNIQUE: Multidetector CT imaging of the chest, abdomen and pelvis was performed following the standard protocol during bolus administration of intravenous contrast. CONTRAST:  132mL OMNIPAQUE IOHEXOL 300 MG/ML  SOLN COMPARISON:  None. FINDINGS: CT CHEST FINDINGS Cardiovascular: Coronary artery bypass grafting has been performed. Mild global cardiomegaly with particular left atrial enlargement. Left subclavian pacemaker leads are seen within the right atrial appendage and right ventricle toward the apex. No pericardial effusion. The central pulmonary arteries are enlarged in keeping with changes of pulmonary arterial hypertension. There is extensive atherosclerotic calcification within the aortic arch and descending thoracic aorta, particularly at the origin of the arch vasculature. Hemodynamically significant stenoses of the innominate and left common carotid artery may be present, but are not well characterized  on this non arteriographic study. No aortic aneurysm. Mediastinum/Nodes: There is no pathologic thoracic adenopathy. There is infiltrative soft tissue encasing the right mainstem bronchus 2 within 6 mm of the carina. Moderate hiatal hernia. Debris and contrast within the esophagus may reflect changes of gastroesophageal reflux or esophageal dysmotility. The visualized thyroid is unremarkable. Lungs/Pleura: A spiculated mass is seen within the right upper lobe extending to the hilum measuring at least 4.7 x 4.5 cm at axial image # 61/7 demonstrating retraction of the adjacent visceral pleura. A second rounded mass is seen within the subpleural dependent right lower lobe measuring 2.6 x 4.5 cm at axial image # 117/7. The findings are suspicious for synchronous primary bronchogenic neoplasm. A a small right pleural effusion is present. There is parenchymal scarring within the left upper lobe. Mild subpleural fibrotic change noted within the left lower lobe. The right mainstem bronchus is narrowed by the circumferential soft tissue rind described above. Musculoskeletal: There are erosive changes involving the a right third rib laterally adjacent to the right upper lobe pulmonary mass in keeping with osseous metastatic disease. CT ABDOMEN PELVIS FINDINGS Hepatobiliary: No focal  liver abnormality is seen. No gallstones, gallbladder wall thickening, or biliary dilatation. Pancreas: Unremarkable Spleen: Unremarkable Adrenals/Urinary Tract: Adrenal glands are unremarkable. Kidneys are normal, without renal calculi, focal lesion, or hydronephrosis. Bladder is unremarkable. Stomach/Bowel: Stomach is within normal limits. Appendix appears normal. No evidence of bowel wall thickening, distention, or inflammatory changes. No free intraperitoneal gas or fluid. Vascular/Lymphatic: Extensive aortoiliac atherosclerotic calcification with particularly prominent atherosclerotic calcification at the renal ostia bilaterally. Extensive  calcification within the left common iliac artery proximally. No aortic aneurysm. No pathologic adenopathy within the abdomen and pelvis Reproductive: Brachytherapy seeds are seen within the prostate gland. Seminal vesicles are unremarkable. Other: Tiny bilateral fat containing inguinal hernias are present. Rectum unremarkable. Musculoskeletal: Lytic metastasis to the L2 vertebral body is again identified, better seen on lumbar CTd examination of 10/17/2020 extending into the right paraspinal soft tissues measuring at least 4.5 x 6.0 cm at axial image # 66/3. Small ventral epidural component noted at this level. Malignant disease erodes much of the right lateral aspect of the vertebral body, however, no associated compression deformity is noted at this time. No other lytic or blastic bone lesions are seen. IMPRESSION: Multiple pulmonary masses, as described above, suspicious for synchronous right upper lobe and right lower lobe bronchogenic neoplasms. Right upper lobe mass appears confluent with a rind of soft tissue which encases the right mainstem bronchus, approaching within 6 mm of the carina, and resulting in mild narrowing of the a mainstem bronchus as well as the central lobar bronchi of the right lung. Osseous metastatic disease to the right third rib laterally as well as the L2 vertebral body, best described on prior CT examination of 10/17/2020. Small epidural component noted involving the L2 vertebral body which is largely eroded. Surgical consultation is advised. Small right pleural effusion. Mild cardiomegaly. Enlargement of the central pulmonary arteries in keeping with pulmonary arterial hypertension. Moderate hiatal hernia with debris within the distal esophagus related to reflux or esophageal dysmotility. Aortic Atherosclerosis (ICD10-I70.0). Electronically Signed   By: Fidela Salisbury MD   On: 10/20/2020 02:11        Scheduled Meds: . acetaminophen  500 mg Oral TID  . amLODipine  5 mg Oral  Daily  . brimonidine  1 drop Both Eyes TID  . cholecalciferol  2,000 Units Oral Daily  . dexamethasone  20 mg Intravenous Q24H  . dorzolamide  1 drop Both Eyes TID  . feeding supplement  237 mL Oral BID BM  . furosemide  80 mg Oral Daily  . latanoprost  1 drop Both Eyes QHS  . lidocaine  1 patch Transdermal Q24H  . oxyCODONE  2.5 mg Oral TID  . pantoprazole  40 mg Oral Daily  . polyethylene glycol  17 g Oral Daily  . potassium chloride SA  40 mEq Oral Daily  . senna  1 tablet Oral BID   Continuous Infusions: . sodium chloride 1,000 mL (10/19/20 1234)  . heparin 1,000 Units/hr (10/20/20 0944)     LOS: 1 day    Time spent: 30 minutes    Barb Merino, MD [facility] Pager [phone]

## 2020-10-20 NOTE — Consult Note (Signed)
Palliative Medicine Inpatient Consult Note  Reason for consult:  "pathological fracture with pain"  HPI:  Per intake H&P --> Jonathan Warner is a 87 y.o. male with medical history significant of remote history of prostate CA status post surgical removal and radiation, CAD s/p CABG, chronic A. fib on Eliquis, tachybradycardia syndrome status post PPM, chronic diastolic CHF, recently diagnosed L2 metastatic CA and pathologic fracture presented with worsening of back pain and ambulation dysfunction.  Palliative care was asked to aid in pain management associated with pathological lumbar fracture.   Clinical Assessment/Goals of Care:  *Please note that this is a verbal dictation therefore any spelling or grammatical errors are due to the "[place]" system interpretation.  I have reviewed medical records including EPIC notes, labs and imaging, received report from bedside RN, assessed the patient who was sitting in bed in NAD.    I met with Jonathan Warner to further discuss diagnosis prognosis, [place], EOL wishes, disposition and options.   I introduced Palliative Medicine as specialized medical care for people living with serious illness. It focuses on providing relief from the symptoms and stress of a serious illness. The goal is to improve quality of life for both the patient and the family.  Jonathan Warner shares with me that he was born in the [place] area. He goes on to say that he has lived in [place] for most of his life. He has been married to his spouse, Jonathan Warner for the past 66 one years. He has one son, Jonathan Warner and two granddaughters. 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 He gets joy out of watching [name] and spending time with his family. He is a religious man and is a member of the [employer].   Prior to admission Jonathan Warner was living in a home with his wife. He  had experienced an increase in number(s) of falls and in December had a fairly severe fall where his knee's went out on him. He shares that he went to multiple providers and his diagnosis of this L2 fracture was more recently identified. He is aware that this is associated with cancer though that it is less likely related to his prior history of prostate cancer.   A detailed discussion was had today regarding advanced directives - per Jonathan Warner he has completed these and a living will. I will request a copy from hit.    Concepts specific to code status, artifical feeding and hydration, continued IV antibiotics and rehospitalization was had.  We reviewed the MOST form he completed on 05/06/2020 as below:  Cardiopulmonary Resuscitation: Attempt Resuscitation (CPR)  Medical Interventions: Full Scope of Treatment: Use intubation, advanced airway interventions, mechanical ventilation, cardioversion as indicated, medical treatment, IV fluids, etc, also provide comfort measures. Transfer to the hospital if indicated - Would not wish to be on life support without improvement for > 1 week  Antibiotics: Antibiotics if indicated  IV Fluids: IV fluids if indicated  Feeding Tube: No feeding tube   The difference between a aggressive medical intervention path  and a palliative comfort care path for this patient at this time was had. Values and goals of care important to patient and family were attempted to be elicited. At this juncture his personal goals are to enjoy spending time with his family. He states that he has led a good life and if his end comes  that he is ready for it. I asked him if he felt this way why would he elect to be full code? He goes on to tell me that if a witnessed event occurs he does want efforts made to resuscitate him but would never wish to be in a vegetative state or on machines for prolonged periods of time.  We reviewed Jonathan Warner back pain as below:   Modifying - Mobility worsens the  pain Quality - A constant sharp pain Region - Deep, starts in the lumbar spine and radiated to his LE and scrtoum Severity - At it's worst is 10/10 this morning was 3/10 Temporal features - Onset is with movement, duration is constant Relief- Patients shares that the tramadol and norco did not relieve his pain though his does feel that the oxycodone provides come relief. He does have a brace at home which helps when walking for stability.  Additionally, Jonathan Warner complains of some nausea which occurred last evening. We reviewed that various causes, he shares he felt relief after vomiting and believes these symptoms were related to his ingestion of contrast and medication on an empty stomach.   Gait and mobility assessment completed. Patient was able to stand with a FWW and mobilize > 4f with SBA.   Discussed the importance of continued conversation with family and their  medical providers regarding overall plan of care and treatment options, ensuring decisions are within the context of the patients values and GOCs.  Decision Maker: Patient makes his own decision, He shares with me that his wife, Jonathan Warner his sAir traffic controller  SUMMARY OF RECOMMENDATIONS   Full code  With some limitations per MOST in [place] --> we will continue to explore thoughts regarding resuscitation  during hospitalization  Will request a copy of advance directives  Will refer to OP Palliative Care oncology symptom management clinic with Dr. GHilma Favorsand MWadie Lessen Symptoms management as below  Code Status/Advance Care Planning: FULL CODE   Symptom Management:  Lumbar Back Pain d/t Pathological Fx:  - Tylenol 5063mTID  - Lidocaine Patches  - Oxycodone 1m61mO 30 minutes prior to mobility  - Oncology to order a dose of Aredia  - Would consider calcitonin nasal spray for a 1 week course though he is going to receive a bisphosphonate as above therefore this may not be Needed. Literature does support this for  pathological bone fractures of the spine to be an effective reliever of pain  - A short course of an antispasmodic could be effective for symptom relief though would need to weigh the risk vs benefit in the setting of patients advanced age  - Agree with dexamethasone  - Agree with Ortho-Spine consultation per Dr. EnnDicie Beamte - Patient is seeing Dr.  BlaSherlene Shams an OP  - Agree with stabilization brace  Constipation Management:  - Senna 1 tab PO BID  - Miralax 17gm Daily  Generalized Weakness:  - PT/OT   Palliative Prophylaxis:   Oral Care, Turn Q2H  Additional Recommendations (Limitations, Scope, Preferences):  Continue current scope of care  Psycho-social/Spiritual:   Desire for further Chaplaincy support: Yes - MorLatviadditional Recommendations: Education on pathological fractures   Prognosis: Unclear - will depend on primary ideology of fracture  Discharge Planning: Unclear - will depend on PT evaluation  Vitals:   10/19/20 2000 10/20/20 0300  BP: 135/77 124/68  Pulse: 75 74  Resp: 17 16  Temp: 98 F (36.7 C) 98.1 F (36.7 C)  SpO2:  97% 98%    Intake/Output Summary (Last 24 hours) at 10/20/2020 1219 Last data filed at 10/19/2020 2300 Gross per 24 hour  Intake 1052 ml  Output 850 ml  Net 202 ml   Last Weight  Most recent update: 10/19/2020  4:39 PM   Weight  70.3 kg (155 lb)           Gen:  Elderly Caucasian M in NAD HEENT: moist mucous membranes CV: Regular rate and irregular rhythm, no murmurs rubs or gallops PULM: clear to auscultation bilaterally  ABD: soft/nontender  EXT: No edema  Neuro: Alert and oriented x4  PPS:   This conversation/these recommendations were discussed with patient primary care team, Dr. Sloan Leiter  Time In: 0700 Time Out:0820 Total Time: 80 Greater than 50%  of this time was spent counseling and coordinating care related to the above assessment and plan.  [facility] Team Team  Cell Phone: [phone] Please utilize secure chat with additional questions, if there is no response within 30 minutes please call the above phone number  Palliative Medicine Team providers are available by phone from 7am to 7pm daily and can be reached through the team cell phone.  Should this patient require assistance outside of these hours, please call the patient's attending physician.

## 2020-10-20 NOTE — Consult Note (Signed)
NAME:  Jonathan Warner, MRN:  [id number], DOB:  1932/08/17, LOS: 1 ADMISSION DATE:  10/19/2020, CONSULTATION DATE: 10/20/2020 REFERRING MD: Dr. Sloan Leiter, CHIEF COMPLAINT: Abnormal CT chest  Brief History:  87 year old gentleman admitted for back pain concern for L2 compression fracture versus metastatic disease.  Large lung mass found on CT scan pulmonary consulted for tissue diagnosis management.  History of Present Illness:  This is an 87 year old gentleman, history of prostate cancer, status post surgery and radiation, history of coronary artery disease status post CABG, chronic atrial fibrillation on Eliquis, tachybradycardia syndrome, history of permanent pacemaker, chronic diastolic heart failure presented to the hospital with back pain found to have a L2 compression fracture felt to be pathologic concerning for metastatic disease.  Patient had CT scan of the chest which revealed large lung mass CT scan of the head was completed with and without contrast.  Pacemaker in place unable to complete MRI.  Patient however was on Eliquis and was given Eliquis yesterday at 3:30 PM.  And currently transition to heparin infusion.  Patient is a former smoker quit in the 1980s.  Past Medical History:   Past Medical History:  Diagnosis Date  . Arthritis   . BPH (benign prostatic hyperplasia)   . CAD (coronary artery disease)    s/p CABG 1991  . Carotid artery occlusion   . Cataract    b/l surgery  . Chronic kidney disease (CKD), stage III (moderate) (HCC)   . Diverticulosis   . DJD (degenerative joint disease)   . DJD (degenerative joint disease)   . GERD (gastroesophageal reflux disease)   . Glaucoma   . Hiatal hernia   . History of adenomatous polyp of colon 06/07/13   Last colonoscopy 06/27/13 showed small adenoma, no further testing due to advanced age.  [name] History of radiation therapy 07/25/2012-09/20/2012   prostate 7800 cGy 40 sessions, seminal vesicles 5600 cGy 40 sessions  . History of  shingles   . Hypercholesteremia   . Hyperlipidemia   . Hypertension   . Inguinal hernia   . Osteomyelitis of forearm, right, acute ([place])    drainage x 2  . Persistent atrial fibrillation ([place])   . Prostate cancer ([place]) 04/27/12   Dr. Eulogio Ditch. Radiation therapy. Adenocarcinoma,gleason=3+4=7, 3+3=6,PSA=4.93  volume=51cc  . Pulmonary nodules    small rlung base nodule 4.1mm, scarring in lung bases  . 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  No other respiratory complaints.  Objective   Blood pressure 124/68, pulse 74, temperature 98.1 F (36.7 C), temperature source Oral, resp. rate 16, height 5\' 8"  (1.727 m), weight 70.3 kg, SpO2 98 %.        Intake/Output Summary (Last 24 hours) at 10/20/2020 1111 Last data filed at 10/19/2020 2300 Gross per 24 hour  Intake 1052 ml  Output 850 ml  Net 202 ml   Filed Weights   10/19/20 1549  Weight: 70.3 kg    Examination: General: Elderly male, sitting up in chair recently worked with PT, no respiratory distress HENT: Tracking appropriately NCAT Lungs: Clear to auscultation bilaterally no crackles no wheeze Cardiovascular: The  rate rhythm, S1-S2 Abdomen: Nontender nondistended Extremities: No significant edema Neuro: Alert oriented following commands GU: Deferred  Resolved Hospital Problem list     Assessment & Plan:   Large spiculated right upper lobe mass measuring 4.7 cm patient also has a right lower lobe mass with 2.6 x 4.5 cm possibly representing synchronous primaries.  Small right-sided pleural effusion Right third rib erosive changes concerning for osseous metastatic disease L2  involvement, small epidural component concerning for metastatic disease Former smoker Overall clinical picture is concerning for advanced age bronchogenic carcinoma History of atrial fibrillation on anticoagulation Plan: Patient will need 2 full days off of Eliquis before bronchoscopy. We should be able to sample both lesions to ensure they have the same pathology or 2 separate primaries. It does however appear that he has other distant sites of metastasis. Soft tissue biopsy will be needed for molecular diagnostics. Patient is currently on a heparin infusion and will need to be off at least 4 hours prior to case. Once we have case time identified tomorrow we will place orders for holding of heparin. N.p.o. night before procedure.    Labs   CBC: Recent Labs  Lab 10/19/20 1228 10/20/20 0556  WBC 6.7 3.3*  HGB 12.0* 11.5*  HCT 38.2* 34.5*  MCV 99.0 96.4  PLT 184 160    Basic Metabolic Panel: Recent Labs  Lab 10/19/20 1228 10/20/20 0556  NA 139 138  K 3.0* 4.5  CL 97* 97*  CO2 29 28  GLUCOSE 114* 164*  BUN 12 12  CREATININE 1.15 1.09  CALCIUM 9.6 9.7  MG 1.6*  --    GFR: Estimated Creatinine Clearance: 47.1 mL/min (by C-G formula based on SCr of 1.09 mg/dL). Recent Labs  Lab 10/19/20 1228 10/20/20 0556  WBC 6.7 3.3*    Liver Function Tests: Recent Labs  Lab 10/19/20 1228  AST 23  ALT 11  ALKPHOS 95  BILITOT 1.9*  PROT 6.7  ALBUMIN 3.4*   No results for input(s): LIPASE, AMYLASE in the last 168 hours. No results for input(s): AMMONIA in the last 168 hours.  ABG No results found for: PHART, PCO2ART, PO2ART, HCO3, TCO2, ACIDBASEDEF, O2SAT   Coagulation Profile: No results for input(s): INR, PROTIME in the last 168 hours.  Cardiac Enzymes: No results for input(s): CKTOTAL, CKMB, CKMBINDEX, TROPONINI in the last 168 hours.  HbA1C: No results found for: HGBA1C  CBG: No results for input(s): GLUCAP in the last 168 hours.  Review of Systems:    Review of Systems  Constitutional: Positive for malaise/fatigue and weight loss. Negative for chills and fever.  HENT: Negative for hearing loss, sore throat and tinnitus.   Eyes: Negative for blurred vision and double vision.  Respiratory: Positive for shortness of breath. Negative for cough, hemoptysis, sputum production, wheezing and stridor.   Cardiovascular: Negative for chest pain, palpitations, orthopnea, leg swelling and PND.  Gastrointestinal: Negative for abdominal pain, constipation, diarrhea, heartburn, nausea and vomiting.  Genitourinary: Negative for dysuria, hematuria and urgency.  Musculoskeletal: Negative for joint pain and myalgias.  Skin: Negative for itching and rash.  Neurological: Negative for dizziness, tingling, weakness and headaches.  Endo/Heme/Allergies: Negative for environmental allergies. Does not bruise/bleed easily.  Psychiatric/Behavioral: Negative for depression. The patient is not nervous/anxious and does not have insomnia.   All other systems reviewed and are negative.    Past Medical History:  He,  has a past medical history of Arthritis, BPH (benign prostatic hyperplasia), CAD (coronary artery disease), Carotid artery occlusion,  Cataract, Chronic kidney disease (CKD), stage III (moderate) ([place]), Diverticulosis, DJD (degenerative joint disease), DJD (degenerative joint disease), GERD (gastroesophageal reflux disease), Glaucoma, Hiatal hernia, History of adenomatous polyp of colon (06/07/13), History of radiation therapy (07/25/2012-09/20/2012), History of shingles, Hypercholesteremia, Hyperlipidemia, Hypertension, Inguinal hernia, Osteomyelitis of forearm, right, acute ([place]), Persistent atrial fibrillation ([place]), Prostate cancer ([place]) (04/27/12), Pulmonary nodules, Symptomatic bradycardia, and Undescended left testicle.   Surgical History:   Past Surgical History:  Procedure Laterality Date  . CARDIAC CATHETERIZATION  06/04/09   left w/noted patent bypass  grafts  . CATARACT EXTRACTION W/ INTRAOCULAR LENS IMPLANT  2011  . COLONOSCOPY  12/21/2007   hx polyps, diverticulosis  . COLONOSCOPY WITH PROPOFOL N/A 06/27/2013   Procedure: COLONOSCOPY WITH PROPOFOL;  Surgeon: Garlan Fair, MD;  Location: [facility] ENDOSCOPY;  Service: Endoscopy;  Laterality: N/A;  . CORONARY ARTERY BYPASS GRAFT  1991   x5; prior MI  . PACEMAKER GENERATOR CHANGE  05/24/12  . PACEMAKER INSERTION  05/14/03   MDT EnPulse implanted by Dr Leonia Reeves ddd  . PERMANENT PACEMAKER GENERATOR CHANGE N/A 05/24/2012   Procedure: PERMANENT PACEMAKER GENERATOR CHANGE;  Surgeon: Thompson Grayer, MD;  [facility] C[facility] LAB;  Service: Cardiovascular;  Laterality: N/A;  . PROSTATE BIOPSY  04/27/12   Adenocarcinoma,gleason=3+3=6, 3+4,& 4+3,PSA=4.93,volume=51cc  . TUMOR REMOVAL     skin tumors removed     Social History:   reports that he quit smoking about 39 years ago. His smoking use included cigarettes. He has a 5.00 pack-year smoking history. He has never used smokeless tobacco. He reports that he does not drink alcohol and does not use drugs.   Family History:  His family history includes Alzheimer's disease in his father; CAD in his brother and mother; CVA in his brother; Congestive Heart Failure in his mother; Heart attack in his brother; Hypertension in his mother and sister; Pancreatic cancer in his brother; Stroke in his brother.   Allergies Allergies  Allergen Reactions  . Pilocarpine Other (See Comments)    Made him feel very funny feeling.  . Rosuvastatin Other (See Comments)    Knee and leg pain Knee and leg pain     Home Medications  Prior to Admission medications   Medication Sig Start Date End Date Taking? Authorizing Provider  acetaminophen (TYLENOL) 650 MG CR tablet Take 650 mg by mouth every 8 (eight) hours as needed for pain.   Yes [provider]  amLODipine (NORVASC) 5 MG tablet Take 1 tablet (5 mg total) by mouth daily. 07/10/20  Yes Belva Crome, MD   apixaban (ELIQUIS) 5 MG TABS tablet Take 5 mg by mouth 2 (two) times daily.   Yes Josetta Huddle, MD  brimonidine (ALPHAGAN) 0.2 % ophthalmic solution Place 1 drop into both eyes 3 (three) times daily.  09/27/18  Yes [provider]  Cholecalciferol (VITAMIN D3) 2000 units TABS Take 2,000 Units by mouth daily.   Yes [provider]  dorzolamide (TRUSOPT) 2 % ophthalmic solution Place 1 drop into both eyes 3 (three) times daily.  11/20/17  Yes [provider]  furosemide (LASIX) 40 MG tablet Take 2 tablets by mouth every morning.  Take one tablet by mouth every evening. Patient taking differently: Take 80 mg by mouth daily. 09/04/20  Yes Belva Crome, MD  HYDROcodone-acetaminophen (NORCO/VICODIN) 5-325 MG tablet TAKE ONE TABLET BY MOUTH EVERY 6 HOURS AS NEEDED FOR moderate pain Patient taking differently: Take 1 tablet by mouth every 6 (six) hours as needed for moderate  pain. 10/18/20  Yes Mcarthur Rossetti, MD  latanoprost (XALATAN) 0.005 % ophthalmic solution Place 1 drop into both eyes at bedtime.   Yes [provider]  Multiple Vitamin (MULTIVITAMIN) tablet Take 1 tablet by mouth daily.   Yes [provider]  nitroGLYCERIN (NITROSTAT) 0.4 MG SL tablet PLACE 1 TABLET UNDER TONGUE EVERY FIVE MINUTES AS NEEDED FOR CHEST PAIN. 12/08/19  Yes Belva Crome, MD  omeprazole (PRILOSEC) 20 MG capsule Take 20 mg by mouth daily.   Yes [provider]  potassium chloride SA (KLOR-CON M20) 20 MEQ tablet Take 1 tablet (20 mEq total) by mouth 2 (two) times daily. Patient taking differently: Take 40 mEq by mouth daily. 08/02/20  Yes Belva Crome, MD  trolamine salicylate (BLUE-EMU HEMP) 10 % cream Apply 1 application topically as needed for muscle pain.   Yes [provider]  vitamin E 400 UNIT capsule Take 400 Units by mouth daily.   Yes [provider]  meclizine (ANTIVERT) 25 MG tablet Take 1 tablet (25 mg total) by mouth 3 (three)  times daily as needed for dizziness. Patient not taking: Reported on 10/19/2020 05/16/20   Domenic Moras, PA-C  traMADol Veatrice [place]bon) 50 MG tablet TAKE ONE TABLET BY MOUTH EVERY 6 HOURS AS NEEDED Patient not taking: Reported on 10/19/2020 10/11/20   Mcarthur Rossetti, MD      Garner Nash, DO [facility] 10/20/2020 4:44 PM

## 2020-10-20 NOTE — Progress Notes (Signed)
Back from CT

## 2020-10-20 NOTE — Evaluation (Signed)
Physical Therapy Evaluation Patient Details Name: Jonathan Warner MRN: [id number] DOB: 1931/10/26 Today's Date: 10/20/2020   History of Present Illness  Jonathan Warner is a 87 y.o. male with medical history significant of remote history of prostate CA status post surgical removal and radiation, CAD s/p CABG, chronic A. fib on Eliquis, tachybradycardia syndrome status post PPM, chronic diastolic CHF, recently diagnosed L2 metastatic CA and pathologic fracture presented with worsening of back pain and ambulation dysfunction.  Clinical Impression   Pt admitted with above diagnosis. Lives at home with wife in a single story home with a few steps to enter; Independent prior to fall in December 2021, when he sustained the T2 vertebral fx; Presents to PT with pain limiting mobility, especially sit<>stand transitions; Plan to teach log rolling and sidelying to sit technqiues with the goal of making getting in and out of bed more tolerable; Recommend OT for help with ADLs; will order per protocol;  Pt currently with functional limitations due to the deficits listed below (see PT Problem List). Pt will benefit from skilled PT to increase their independence and safety with mobility to allow discharge to the venue listed below.       Follow Up Recommendations Home health PT    Equipment Recommendations  3in1 (PT)    Recommendations for Other Services OT consult (will order per protocol)     Precautions / Restrictions Precautions Precautions: Fall;Other (comment) (Plan to try to follow back precautions for comfort) Required Braces or Orthoses: Other Brace Other Brace: Pt's son to bring in his brace from home;      Mobility  Bed Mobility Overal bed mobility: Needs Assistance Bed Mobility: Supine to Sit     Supine to sit: Supervision;HOB elevated (HOB significantly elevated)     General bed mobility comments: Pt moved well getting up; with HOB near sitting, he "walked" his LEs to EOB and then off the  bed, and pushed up from [facility] to sit    Transfers Overall transfer level: Needs assistance Equipment used: Rolling walker (2 wheeled) Transfers: Sit to/from Stand Sit to Stand: Min guard         General transfer comment: Cue for pre-positioning and posture so that he can keep upper body more upright, with hopes of decreasing pain with sit<>stand transitions; noted big grimace about halway into transition up or down  Ambulation/Gait Ambulation/Gait assistance: Min guard Gait Distance (Feet): 5 Feet Assistive device: Rolling walker (2 wheeled) Gait Pattern/deviations: Step-through pattern     General Gait Details: bears down into RW to take pressure off his back  Stairs            Wheelchair Mobility    Modified Rankin (Stroke Patients Only)       Balance Overall balance assessment: Needs assistance   Sitting balance-Leahy Scale: Fair       Standing balance-Leahy Scale: Poor                               Pertinent Vitals/Pain Pain Assessment: Faces Faces Pain Scale: Hurts even more Pain Location: Low back with sit<>stand, in particular at the point when he moves hands from pushing up to RW (and vice versa) during transfers Pain Descriptors / Indicators: Aching;Grimacing;Guarding Pain Intervention(s): Monitored during session    [place] expects to be discharged to:: Private residence Living Arrangements: Spouse/significant other Available Help at Discharge: Family Type of Home: House Home Access: Stairs to  enter Entrance Stairs-Rails:  (Need to find out) Entrance Stairs-Number of Steps: 3 Home Layout: One level Home Equipment: Walker - 2 wheels      Prior Function Level of Independence: Needs assistance   Gait / Transfers Assistance Needed: walks with RW  ADL's / Homemaking Assistance Needed: Back pain limiting independence with bathing and dressing  Comments: Was fully independent until falls started around Christmas  last year, and one fall resulted in teh pathologic L2 fx     Hand Dominance        Extremity/Trunk Assessment   Upper Extremity Assessment Upper Extremity Assessment: Overall WFL for tasks assessed (for simple tasks)    Lower Extremity Assessment Lower Extremity Assessment: Generalized weakness    Cervical / Trunk Assessment Cervical / Trunk Assessment: Other exceptions Cervical / Trunk Exceptions: Notable sharp pain in lumbar region when pt transitions sit<>stnd  Communication   Communication: No difficulties  Cognition Arousal/Alertness: Awake/alert Behavior During Therapy: WFL for tasks assessed/performed Overall Cognitive Status: Within Functional Limits for tasks assessed                                        General Comments      Exercises     Assessment/Plan    PT Assessment Patient needs continued PT services  PT Problem List Decreased strength;Decreased range of motion;Decreased activity tolerance;Decreased balance;Decreased mobility;Decreased knowledge of use of DME;Pain       PT Treatment Interventions DME instruction;Gait training;Stair training;Functional mobility training;Therapeutic activities;Therapeutic exercise;Balance training;Patient/family education    PT Goals (Current goals can be found in the Care Plan section)  Acute Rehab PT Goals Patient Stated Goal: decr pain PT Goal Formulation: With patient Time For Goal Achievement: 11/03/20 Potential to Achieve Goals: Good    Frequency Min 3X/week   Barriers to discharge        Co-evaluation               AM-PAC PT "6 Clicks" Mobility  Outcome Measure Help needed turning from your back to your side while in a flat bed without using bedrails?: A Little Help needed moving from lying on your back to sitting on the side of a flat bed without using bedrails?: A Little Help needed moving to and from a bed to a chair (including a wheelchair)?: A Little Help needed standing  up from a chair using your arms (e.g., wheelchair or bedside chair)?: A Little Help needed to walk in hospital room?: None Help needed climbing 3-5 steps with a railing? : A Little 6 Click Score: 19    End of Session   Activity Tolerance: Patient tolerated treatment well;Patient limited by pain Patient left: in chair;with call bell/phone within reach;with family/visitor present Nurse Communication: Mobility status PT Visit Diagnosis: Unsteadiness on feet (R26.81);Other abnormalities of gait and mobility (R26.89);Pain Pain - part of body:  (Lumbar spine)    Time: 9622-2979 PT Time Calculation (min) (ACUTE ONLY): 23 min   Charges:   PT Evaluation $PT Eval Low Complexity: 1 Low PT Treatments $Therapeutic Activity: 8-22 mins        Roney Marion, PT  Acute Rehabilitation Services Pager [phone] Office [phone]   [place] 10/20/2020, 6:15 PM

## 2020-10-20 NOTE — Progress Notes (Signed)
Patient returned to room via bed by transporter at 1045 on 10/20/20.

## 2020-10-21 DIAGNOSIS — M8458XA Pathological fracture in neoplastic disease, other specified site, initial encounter for fracture: Secondary | ICD-10-CM | POA: Diagnosis not present

## 2020-10-21 DIAGNOSIS — I4891 Unspecified atrial fibrillation: Secondary | ICD-10-CM

## 2020-10-21 DIAGNOSIS — Z7901 Long term (current) use of anticoagulants: Secondary | ICD-10-CM

## 2020-10-21 DIAGNOSIS — R262 Difficulty in walking, not elsewhere classified: Secondary | ICD-10-CM | POA: Diagnosis not present

## 2020-10-21 DIAGNOSIS — M4850XA Collapsed vertebra, not elsewhere classified, site unspecified, initial encounter for fracture: Secondary | ICD-10-CM | POA: Diagnosis not present

## 2020-10-21 DIAGNOSIS — C7951 Secondary malignant neoplasm of bone: Secondary | ICD-10-CM | POA: Diagnosis present

## 2020-10-21 DIAGNOSIS — M899 Disorder of bone, unspecified: Secondary | ICD-10-CM | POA: Diagnosis not present

## 2020-10-21 DIAGNOSIS — Z515 Encounter for palliative care: Secondary | ICD-10-CM

## 2020-10-21 DIAGNOSIS — M549 Dorsalgia, unspecified: Secondary | ICD-10-CM | POA: Diagnosis not present

## 2020-10-21 DIAGNOSIS — R634 Abnormal weight loss: Secondary | ICD-10-CM | POA: Diagnosis not present

## 2020-10-21 DIAGNOSIS — Z87891 Personal history of nicotine dependence: Secondary | ICD-10-CM | POA: Diagnosis not present

## 2020-10-21 LAB — HEPARIN LEVEL (UNFRACTIONATED)
Heparin Unfractionated: 1.12 IU/mL — ABNORMAL HIGH (ref 0.30–0.70)
Heparin Unfractionated: 1.06 IU/mL — ABNORMAL HIGH (ref 0.30–0.70)

## 2020-10-21 LAB — BASIC METABOLIC PANEL
Anion gap: 13 (ref 5–15)
BUN: 19 mg/dL (ref 8–23)
CO2: 26 mmol/L (ref 22–32)
Calcium: 9.4 mg/dL (ref 8.9–10.3)
Chloride: 98 mmol/L (ref 98–111)
Creatinine, Ser: 1.23 mg/dL (ref 0.61–1.24)
GFR, Estimated: 57 mL/min — ABNORMAL LOW (ref >60)
Glucose, Bld: 162 mg/dL — ABNORMAL HIGH (ref 70–99)
Potassium: 3.8 mmol/L (ref 3.5–5.1)
Sodium: 137 mmol/L (ref 135–145)

## 2020-10-21 LAB — CEA: CEA: 3.6 ng/mL (ref 0.0–4.7)

## 2020-10-21 LAB — CBC
HCT: 36.3 % — ABNORMAL LOW (ref 39.0–52.0)
Hemoglobin: 11.6 g/dL — ABNORMAL LOW (ref 13.0–17.0)
MCH: 30.5 pg (ref 26.0–34.0)
MCHC: 32 g/dL (ref 30.0–36.0)
MCV: 95.5 fL (ref 80.0–100.0)
Platelets: 210 10*3/uL (ref 150–400)
RBC: 3.8 MIL/uL — ABNORMAL LOW (ref 4.22–5.81)
RDW: 14 % (ref 11.5–15.5)
WBC: 6.8 10*3/uL (ref 4.0–10.5)
nRBC: 0 % (ref 0.0–0.2)

## 2020-10-21 LAB — APTT
aPTT: 84 seconds — ABNORMAL HIGH (ref 24–36)
aPTT: 85 seconds — ABNORMAL HIGH (ref 24–36)

## 2020-10-21 LAB — LACTATE DEHYDROGENASE: LDH: 189 U/L (ref 98–192)

## 2020-10-21 MED ORDER — BOOST / RESOURCE BREEZE PO LIQD CUSTOM
1.0000 | Freq: Three times a day (TID) | ORAL | Status: DC
  Administered 2020-10-21 – 2020-10-31 (×23): 1 via ORAL

## 2020-10-21 MED ORDER — SODIUM CHLORIDE 0.9% FLUSH
9.0000 mL | INTRAVENOUS | Status: DC | PRN
Start: 2020-10-21 — End: 2020-10-25

## 2020-10-21 MED ORDER — NALOXONE HCL 0.4 MG/ML IJ SOLN: 0.4000 mg | INTRAMUSCULAR | Status: DC | PRN

## 2020-10-21 MED ORDER — DIPHENHYDRAMINE HCL 50 MG/ML IJ SOLN: 12.5000 mg | Freq: Four times a day (QID) | INTRAMUSCULAR | Status: DC | PRN

## 2020-10-21 MED ORDER — HYDROMORPHONE 1 MG/ML IV SOLN: INTRAVENOUS | Status: DC

## 2020-10-21 MED ORDER — DIPHENHYDRAMINE HCL 12.5 MG/5ML PO ELIX: 12.5000 mg | ORAL_SOLUTION | Freq: Four times a day (QID) | ORAL | Status: DC | PRN

## 2020-10-21 MED ORDER — HYDROMORPHONE 1 MG/ML IV SOLN
INTRAVENOUS | Status: DC
Start: 2020-10-21 — End: 2020-10-25
  Administered 2020-10-21: 30 mg via INTRAVENOUS
  Administered 2020-10-21: 0.4 mg via INTRAVENOUS
  Administered 2020-10-21: 1 mg via INTRAVENOUS
  Administered 2020-10-22: 0.1 mL via INTRAVENOUS
  Administered 2020-10-22: 0.6 mg via INTRAVENOUS
  Administered 2020-10-22: 0 mg via INTRAVENOUS
  Administered 2020-10-22: 0.3 mg via INTRAVENOUS
  Administered 2020-10-23: 0 mL via INTRAVENOUS
  Administered 2020-10-23: 0.2 mg via INTRAVENOUS
  Administered 2020-10-23: 0 mg via INTRAVENOUS
  Administered 2020-10-23: 0.1 mg via INTRAVENOUS
  Administered 2020-10-23: 0.5 mL via INTRAVENOUS
  Administered 2020-10-23: 0.1 mg via INTRAVENOUS
  Administered 2020-10-23: 0.3 mL via INTRAVENOUS
  Administered 2020-10-24: 0.1 mg via INTRAVENOUS
  Administered 2020-10-24: 0.4 mg via INTRAVENOUS
  Administered 2020-10-24: 30 mg via INTRAVENOUS
  Administered 2020-10-25: 0.1 mg via INTRAVENOUS
  Filled 2020-10-21 (×2): qty 30

## 2020-10-21 MED ORDER — ADULT MULTIVITAMIN W/MINERALS CH
1.0000 | ORAL_TABLET | Freq: Every day | ORAL | Status: DC
  Administered 2020-10-23 – 2020-10-31 (×8): 1 via ORAL
  Filled 2020-10-21 (×9): qty 1

## 2020-10-21 MED ORDER — HYDROMORPHONE 1 MG/ML IV SOLN
INTRAVENOUS | Status: DC
  Filled 2020-10-21: qty 30

## 2020-10-21 MED ORDER — SODIUM CHLORIDE 0.9% FLUSH: 9.0000 mL | INTRAVENOUS | Status: DC | PRN

## 2020-10-21 NOTE — Progress Notes (Signed)
PROGRESS NOTE    Jonathan Warner  KXF:[id number] DOB: 10-May-1932 DOA: 10/19/2020 PCP: Josetta Huddle, MD    Brief Narrative:  Jonathan Warner is an 87 year old male with past medical history significant for prostate cancer status post , CAD s/p CABG, chronic atrial fibrillation on Eliquis, tachybradycardia syndrome s/p PPM, chronic diastolic congestive heart failure, with recent diagnosis of metastatic L2 compression fracture who was brought to the hospital with severe pain and difficulty ambulating. Patient initially reported fell around Christmas time and has been having pain since. He is follows with orthopedics outpatient Dr. Ninfa Linden and was using a lumbar brace without much help. Patient denies any urinary problems or difficulty moving his bowels. No numbness or weakness of lower extremities. Does report decreased appetite over the past 2 months with being pretty much bedbound over the last few weeks due to his pain. Hospital service was consulted for further evaluation and management of intractable pain from lumbar spinous compression fraction with concerns of malignancy.   Assessment & Plan:   Active Problems:   Pathological fracture   Impaired ambulation   Metastatic cancer to spine [facility])   Palliative care by specialist   L2 pathological fracture Patient presenting to ED with intractable pain over the past several weeks to his L-spine. Initial fall in 07/2020. CT chest/abdomen/pelvis with contrast with osseous metastatic disease to the right 3rd rib and L2 vertebral body; small epidural component noted involving the L2 vertebral body which is largely eroded. --Medical oncology/palliative care following --Etiology thought to be new finding of right upper lobe mass --Nuclear medicine bone scan: Pending --Dexamethasone 20 mg IV every 24 hours --Lidoderm patch --Dilaudid PCA  Right upper lobe mass CT angiogram chest/abdomen/pelvis with multiple pulmonary masses with right upper lobe mass  confluence with a rind of soft tissue encasing the right mainstem bronchus resulting in mild narrowing of the mainstem bronchus. --PCCM/medical oncology following, appreciate assistance --Plan for bronchoscopy on 10/22/2020 --N.p.o. after midnight  Essential hypertension BP 137/75, well controlled. --Amlodipine 5 mg p.o. daily  Chronic diastolic congestive heart failure, compensated --Furosemide 80 mg p.o. daily  Chronic atrial fibrillation Tachybradycardia syndrome s/p PPM --Holding home Eliquis, on heparin drip for pending bronchoscopy   DVT prophylaxis: Heparin drip   Code Status: Full Code Family Communication: No family present at bedside this morning  Disposition Plan:  Level of care: Med-Surg Status is: Inpatient  Remains inpatient appropriate because:Ongoing active pain requiring inpatient pain management, Ongoing diagnostic testing needed not appropriate for outpatient work up, Unsafe d/c plan, IV treatments appropriate due to intensity of illness or inability to take PO and Inpatient level of care appropriate due to severity of illness   Dispo:  Patient From: Home  Planned Disposition: Home with Health Care Svc  Medically stable for discharge: No       Consultants:   Oncology, Dr. Marin Olp  PCCM, Dr. Valeta Harms  Palliative care  Procedures:   None  Antimicrobials:   None   Subjective: Patient seen and examined bedside, resting comfortably. Any type of movement/twisting causes significance low back/midline pain. Denies any urinary/bowel incontinence and no paresthesias. Discussed with patient regarding consideration of kyphoplasty to stabilize the L2 segment, currently hesitant to proceed but discussed with patient we'll print out some information for him to read as this may help in his overall pain that he is experiencing. Palliative care starting Dilaudid PCA to assist with optimized pain control. Medical oncology will discuss with radiation oncology whether  radiation would have any beneficial use  in this patient. Patient without any other questions or concerns at this time. Denies headache, no visual changes, no chest pain, no palpitations, no shortness of breath, no abdominal pain, no fever/chills/night sweats, no nausea cefonicid/diarrhea. No acute events overnight per nursing staff.  Objective: Vitals:   10/21/20 0555 10/21/20 0846 10/21/20 1307 10/21/20 1400  BP: 137/75 (!) 149/79  (!) 158/86  Pulse: 79 75  89  Resp: 18  15 16   Temp: 97.9 F (36.6 C) 98.2 F (36.8 C)  98 F (36.7 C)  TempSrc: Oral Oral  Oral  SpO2: 98% 95% 98% 99%  Weight:      Height:       No intake or output data in the 24 hours ending 10/21/20 1606 Filed Weights   10/19/20 1549  Weight: 70.3 kg    Examination:  General exam: Appears calm and comfortable, mild/moderate pain with minimal movement Respiratory system: Clear to auscultation. Respiratory effort normal. Oxygenating well on room air Cardiovascular system: S1 & S2 heard, RRR. No JVD, murmurs, rubs, gallops or clicks. No pedal edema. Gastrointestinal system: Abdomen is nondistended, soft and nontender. No organomegaly or masses felt. Normal bowel sounds heard. Central nervous system: Alert and oriented. No focal neurological deficits. Extremities: Tenderness to palpation midline back about lumbar segments. Skin: No rashes, lesions or ulcers Psychiatry: Judgement and insight appear normal. Mood & affect appropriate.     Data Reviewed: I have personally reviewed following labs and imaging studies  CBC: Recent Labs  Lab 10/19/20 1228 10/20/20 0556 10/21/20 0059  WBC 6.7 3.3* 6.8  HGB 12.0* 11.5* 11.6*  HCT 38.2* 34.5* 36.3*  MCV 99.0 96.4 95.5  PLT 184 185 790   Basic Metabolic Panel: Recent Labs  Lab 10/19/20 1228 10/20/20 0556 10/21/20 0059  NA 139 138 137  K 3.0* 4.5 3.8  CL 97* 97* 98  CO2 29 28 26   GLUCOSE 114* 164* 162*  BUN 12 12 19   CREATININE 1.15 1.09 1.23  CALCIUM 9.6  9.7 9.4  MG 1.6*  --   --    GFR: Estimated Creatinine Clearance: 41.7 mL/min (by C-G formula based on SCr of 1.23 mg/dL). Liver Function Tests: Recent Labs  Lab 10/19/20 1228  AST 23  ALT 11  ALKPHOS 95  BILITOT 1.9*  PROT 6.7  ALBUMIN 3.4*   No results for input(s): LIPASE, AMYLASE in the last 168 hours. No results for input(s): AMMONIA in the last 168 hours. Coagulation Profile: No results for input(s): INR, PROTIME in the last 168 hours. Cardiac Enzymes: No results for input(s): CKTOTAL, CKMB, CKMBINDEX, TROPONINI in the last 168 hours. BNP (last 3 results) Recent Labs    11/10/19 1038 12/08/19 1232 01/30/20 1336  PROBNP 2,698* 2,453* 2,125*   HbA1C: No results for input(s): HGBA1C in the last 72 hours. CBG: No results for input(s): GLUCAP in the last 168 hours. Lipid Profile: No results for input(s): CHOL, HDL, LDLCALC, TRIG, CHOLHDL, LDLDIRECT in the last 72 hours. Thyroid Function Tests: No results for input(s): TSH, T4TOTAL, FREET4, T3FREE, THYROIDAB in the last 72 hours. Anemia Panel: No results for input(s): VITAMINB12, FOLATE, FERRITIN, TIBC, IRON, RETICCTPCT in the last 72 hours. Sepsis Labs: No results for input(s): PROCALCITON, LATICACIDVEN in the last 168 hours.  Recent Results (from the past 240 hour(s))  SARS CORONAVIRUS 2 (TAT 6-24 HRS) Nasopharyngeal Nasopharyngeal Swab     Status: None   Collection Time: 10/19/20  1:47 PM   Specimen: Nasopharyngeal Swab  Result Value Ref Range Status  SARS Coronavirus 2 NEGATIVE NEGATIVE Final    Comment: (NOTE) SARS-CoV-2 target nucleic acids are NOT DETECTED.  The SARS-CoV-2 RNA is generally detectable in upper and lower respiratory specimens during the acute phase of infection. Negative results do not preclude SARS-CoV-2 infection, do not rule out co-infections with other pathogens, and should not be used as the sole basis for treatment or other patient management decisions. Negative results must be  combined with clinical observations, patient history, and epidemiological information. The expected result is Negative.  Fact Sheet for Patients: SugarRoll.be  Fact Sheet for Healthcare Providers: https://www.woods-mathews.com/  This test is not yet approved or cleared by the [place] FDA and  has been authorized for detection and/or diagnosis of SARS-CoV-2 by FDA under an Emergency Use Authorization (EUA). This EUA will remain  in effect (meaning this test can be used) for the duration of the COVID-19 declaration under Se ction 564(b)(1) of the Act, 21 U.S.C. section 360bbb-3(b)(1), unless the authorization is terminated or revoked sooner.  Performed at [facility], [place] [address], [place], [place] [address]          Radiology Studies: CT HEAD W & WO CONTRAST  Result Date: 10/20/2020 CLINICAL DATA:  87 year old male with evidence of metastatic disease on recent CT Chest, Abdomen, and Pelvis. Unable to have MRI. EXAM: CT HEAD WITHOUT AND WITH CONTRAST TECHNIQUE: Contiguous axial images were obtained from the base of the skull through the vertex without and with intravenous contrast CONTRAST:  76mL OMNIPAQUE IOHEXOL 300 MG/ML  SOLN COMPARISON:  Plain head CT 05/16/2020. FINDINGS: Brain: Stable cerebral volume. No midline shift, ventriculomegaly, mass effect, evidence of mass lesion, intracranial hemorrhage or evidence of cortically based acute infarction. Stable gray-white matter differentiation throughout the brain. No evidence of vasogenic edema. Small chronic lacunar infarcts suspected in the left caudate (series 5, image 30) and stable. No abnormal enhancement identified. Vascular: Mild Calcified atherosclerosis at the skull base. The major intracranial vascular structures are enhancing as expected. Skull: Stable since September. Small circumscribed sclerotic area in the left parietal bone (series 4, image 68) is probably a benign  bone island. No other suspicious osseous lesion. Anterior C1-C2 degeneration incidentally noted. Sinuses/Orbits: Visualized paranasal sinuses and mastoids are stable and well pneumatized. Other: No acute orbit or scalp soft tissue finding. IMPRESSION: No metastatic disease or acute intracranial abnormality identified. Stable Head CT since September. Electronically Signed   By: Genevie Ann M.D.   On: 10/20/2020 10:32   CT CHEST ABDOMEN PELVIS W CONTRAST  Result Date: 10/20/2020 CLINICAL DATA:  Low back pain, L2 pathologic fracture. EXAM: CT CHEST, ABDOMEN, AND PELVIS WITH CONTRAST TECHNIQUE: Multidetector CT imaging of the chest, abdomen and pelvis was performed following the standard protocol during bolus administration of intravenous contrast. CONTRAST:  146mL OMNIPAQUE IOHEXOL 300 MG/ML  SOLN COMPARISON:  None. FINDINGS: CT CHEST FINDINGS Cardiovascular: Coronary artery bypass grafting has been performed. Mild global cardiomegaly with particular left atrial enlargement. Left subclavian pacemaker leads are seen within the right atrial appendage and right ventricle toward the apex. No pericardial effusion. The central pulmonary arteries are enlarged in keeping with changes of pulmonary arterial hypertension. There is extensive atherosclerotic calcification within the aortic arch and descending thoracic aorta, particularly at the origin of the arch vasculature. Hemodynamically significant stenoses of the innominate and left common carotid artery may be present, but are not well characterized on this non arteriographic study. No aortic aneurysm. Mediastinum/Nodes: There is no pathologic thoracic adenopathy. There is infiltrative soft  tissue encasing the right mainstem bronchus 2 within 6 mm of the carina. Moderate hiatal hernia. Debris and contrast within the esophagus may reflect changes of gastroesophageal reflux or esophageal dysmotility. The visualized thyroid is unremarkable. Lungs/Pleura: A spiculated mass is  seen within the right upper lobe extending to the hilum measuring at least 4.7 x 4.5 cm at axial image # 61/7 demonstrating retraction of the adjacent visceral pleura. A second rounded mass is seen within the subpleural dependent right lower lobe measuring 2.6 x 4.5 cm at axial image # 117/7. The findings are suspicious for synchronous primary bronchogenic neoplasm. A a small right pleural effusion is present. There is parenchymal scarring within the left upper lobe. Mild subpleural fibrotic change noted within the left lower lobe. The right mainstem bronchus is narrowed by the circumferential soft tissue rind described above. Musculoskeletal: There are erosive changes involving the a right third rib laterally adjacent to the right upper lobe pulmonary mass in keeping with osseous metastatic disease. CT ABDOMEN PELVIS FINDINGS Hepatobiliary: No focal liver abnormality is seen. No gallstones, gallbladder wall thickening, or biliary dilatation. Pancreas: Unremarkable Spleen: Unremarkable Adrenals/Urinary Tract: Adrenal glands are unremarkable. Kidneys are normal, without renal calculi, focal lesion, or hydronephrosis. Bladder is unremarkable. Stomach/Bowel: Stomach is within normal limits. Appendix appears normal. No evidence of bowel wall thickening, distention, or inflammatory changes. No free intraperitoneal gas or fluid. Vascular/Lymphatic: Extensive aortoiliac atherosclerotic calcification with particularly prominent atherosclerotic calcification at the renal ostia bilaterally. Extensive calcification within the left common iliac artery proximally. No aortic aneurysm. No pathologic adenopathy within the abdomen and pelvis Reproductive: Brachytherapy seeds are seen within the prostate gland. Seminal vesicles are unremarkable. Other: Tiny bilateral fat containing inguinal hernias are present. Rectum unremarkable. Musculoskeletal: Lytic metastasis to the L2 vertebral body is again identified, better seen on lumbar  CTd examination of 10/17/2020 extending into the right paraspinal soft tissues measuring at least 4.5 x 6.0 cm at axial image # 66/3. Small ventral epidural component noted at this level. Malignant disease erodes much of the right lateral aspect of the vertebral body, however, no associated compression deformity is noted at this time. No other lytic or blastic bone lesions are seen. IMPRESSION: Multiple pulmonary masses, as described above, suspicious for synchronous right upper lobe and right lower lobe bronchogenic neoplasms. Right upper lobe mass appears confluent with a rind of soft tissue which encases the right mainstem bronchus, approaching within 6 mm of the carina, and resulting in mild narrowing of the a mainstem bronchus as well as the central lobar bronchi of the right lung. Osseous metastatic disease to the right third rib laterally as well as the L2 vertebral body, best described on prior CT examination of 10/17/2020. Small epidural component noted involving the L2 vertebral body which is largely eroded. Surgical consultation is advised. Small right pleural effusion. Mild cardiomegaly. Enlargement of the central pulmonary arteries in keeping with pulmonary arterial hypertension. Moderate hiatal hernia with debris within the distal esophagus related to reflux or esophageal dysmotility. Aortic Atherosclerosis (ICD10-I70.0). Electronically Signed   By: Fidela Salisbury MD   On: 10/20/2020 02:11        Scheduled Meds: . acetaminophen  500 mg Oral TID  . amLODipine  5 mg Oral Daily  . brimonidine  1 drop Both Eyes TID  . cholecalciferol  2,000 Units Oral Daily  . dexamethasone  20 mg Intravenous Q24H  . dorzolamide  1 drop Both Eyes TID  . feeding supplement  1 Container Oral TID BM  .  furosemide  80 mg Oral Daily  . HYDROmorphone   Intravenous Q4H  . latanoprost  1 drop Both Eyes QHS  . lidocaine  1 patch Transdermal Q24H  . magnesium oxide  400 mg Oral BID  . multivitamin with minerals   1 tablet Oral Daily  . pantoprazole  40 mg Oral Daily  . polyethylene glycol  17 g Oral Daily  . potassium chloride SA  40 mEq Oral Daily  . senna  1 tablet Oral BID   Continuous Infusions: . heparin 1,150 Units/hr (10/21/20 0445)     LOS: 2 days    Time spent: 39 minutes spent on chart review, discussion with nursing staff, consultants, updating family and interview/physical exam; more than 50% of that time was spent in counseling and/or coordination of care.    Eric J [place] DO [facility] Available via Epic secure chat 7am-7pm After these hours, please refer to coverage provider listed on [email or web address] 10/21/2020, 4:06 PM

## 2020-10-21 NOTE — Progress Notes (Signed)
  Discussed with RN Marissa. Eluzer has been hesitant to use the PCA on his own despite spasms and severe right groin pain. Requires coaching and encouragement to push button when in pain. RN reports that Dilaudid immediately provides relief when he does use the PCA. He is now comfortable.  NO CHARGE  [place] PA-C  Palliative Medicine Team [phone]

## 2020-10-21 NOTE — Plan of Care (Signed)

## 2020-10-21 NOTE — Progress Notes (Signed)
Daily Progress Note   Patient Name: Jonathan Warner       Date: 10/21/2020 DOB: May 12, 1932  Age: 87 y.o. MRN#: [id number] Attending Physician: [place] Jonathan J, DO Primary Care Physician: Josetta Huddle, MD Admit Date: 10/19/2020  Reason for Consultation/Follow-up: Pain control  Subjective: Medical records reviewed. Discussed with Jonathan Ruiz NP, Jonathan Warner/RN, Jonathan Warner PT, patient, his wife Jonathan Warner, and his son Jonathan Warner. He is good spirits today, breathing comfortably and in no acute distress. He reports continued pain after trial of scheduled Tylenol, lidocaine patch, K-pad, low-dose scheduled Oxycodone and PRN Oxycodone. When asked about his pain he states "you didn't get rid of it."  Jonathan Warner and his family note that he responded well to Dilaudid this morning until his pain was exacerbated by ambulation to the bedside commode. He has had difficulty finding a comfortable resting position upon returning to bed. Per PT/Jonathan Warner, Jonathan Warner exhibited less grimacing pain during her evaluation today and the abdominal binder seems to be more useful than his brace for support.   Further discussion was continued in the conference room with patient's wife and son. They are quite concerned about his pain control. Provided reassurance that we will continue to monitor and make adjustments. Answered questions and addressed concerns regarding upcoming diagnostics, treatment options such as kyphoplasty/radiation, coordination of care/transportation, and expectations for the future. Family understands that additional information from the bronchoscopy as well as Jonathan Warner's response to radiation will be needed to determine whether quality of life will be acceptable to him moving forward. They are very supportive and would  reasonably consider hospice if the burden of interventions outweigh the benefits.   PMT contact information provided. Encouraged to call with any additional needs.   PM: Jonathan Warner is comfortably preparing to eat his lunch with assistance from family. He reports pain relief with one loading dose Dilaudid via PCA. He denies drowsiness or any other side effects. His family is appreciative of team efforts to relieve Jonathan Warner's suffering.     Length of Stay: 2  Current Medications: Scheduled Meds:  . acetaminophen  500 mg Oral TID  . amLODipine  5 mg Oral Daily  . brimonidine  1 drop Both Eyes TID  . cholecalciferol  2,000 Units Oral Daily  . dexamethasone  20 mg Intravenous Q24H  . dorzolamide  1 drop Both Eyes  TID  . feeding supplement  237 mL Oral BID BM  . furosemide  80 mg Oral Daily  . HYDROmorphone   Intravenous Q4H  . latanoprost  1 drop Both Eyes QHS  . lidocaine  1 patch Transdermal Q24H  . magnesium oxide  400 mg Oral BID  . pantoprazole  40 mg Oral Daily  . polyethylene glycol  17 g Oral Daily  . potassium chloride SA  40 mEq Oral Daily  . senna  1 tablet Oral BID    Continuous Infusions: . heparin 1,150 Units/hr (10/21/20 0445)    PRN Meds: naloxone **AND** sodium chloride flush, ondansetron (ZOFRAN) IV  Physical Exam Vitals and nursing note reviewed.  Constitutional:      General: He is not in acute distress. Cardiovascular:     Rate and Rhythm: Normal rate and regular rhythm.  Pulmonary:     Effort: Pulmonary effort is normal.  Neurological:     Mental Status: He is alert and oriented to person, place, and time.  Psychiatric:        Mood and Affect: Mood normal.        Behavior: Behavior normal.             Vital Signs: BP (!) 149/79 (BP Location: Right Arm)   Pulse 75   Temp 98.2 F (36.8 C) (Oral)   Resp 15   Ht 5\' 8"  (1.727 m)   Wt 70.3 kg   SpO2 98%   BMI 23.57 kg/m  SpO2: SpO2: 98 % O2 Device: O2 Device: Room Air O2 Flow Rate: O2 Flow Rate  (L/min): 0 L/min  Intake/output summary:   Intake/Output Summary (Last 24 hours) at 10/21/2020 1444 Last data filed at 10/20/2020 1517 Gross per 24 hour  Intake 193.71 ml  Output --  Net 193.71 ml   LBM: Last BM Date: 10/19/20 Baseline Weight: Weight: 70.3 kg Most recent weight: Weight: 70.3 kg       Palliative Assessment/Data: 40%      Patient Active Problem List   Diagnosis Date Noted  . Pathological fracture 10/19/2020  . Impaired ambulation 10/19/2020  . Pathologic compression fracture of vertebra (HCC)   . Unilateral primary osteoarthritis, left knee 04/17/2020  . Unilateral primary osteoarthritis, right knee 04/17/2020  . Coronary artery disease involving native coronary artery of native heart with angina pectoris ([place]) 11/06/2016  . Hypertensive heart and chronic kidney disease with heart failure and stage 1 through stage 4 chronic kidney disease, or chronic kidney disease ([place]) 11/06/2016  . Chronic anticoagulation 12/01/2013  . History of radiation therapy   . Prostate cancer ([place]) 05/31/2012  . Bradycardia 05/23/2012  . Atrial fibrillation ([place]) 05/23/2012    Palliative Care Assessment & Plan   Patient Profile: Per intake H&P --> [name] a 87 y.o.malewith medical history significant ofremote history of prostate CA status post surgical removal and radiation,CAD s/p CABG,chronic A. fib on Eliquis, tachybradycardia syndrome status post PPM, chronic diastolic CHF, recently diagnosed L2 metastatic CA and pathologic fracture presented with worsening of back pain and ambulation dysfunction.  Assessment: Pathological fracture of L2 Impaired ambulation  Recommendations/Plan:  Will benefit from long-acting opioid to control baseline and breakthrough pain  Order low-dose Dilaudid 1mg /mL PCA to determine calculation of above  Continue other pain management measures as previously stated  Ongoing discussion regarding goals of care and advance care  planning  Continue to provide psychosocial, emotional support  Will benefit from outpatient palliative care referral, [facility] has been in contact  with family  Appreciate chaplain support  Goals of Care and Additional Recommendations:  Limitations on Scope of Treatment: Full Scope Treatment  Code Status: FULL code/FULL scope   Code Status Orders  (From admission, onward)         Start     Ordered   10/19/20 1425  Full code  Continuous        10/19/20 1425        Code Status History    Date Active Date Inactive Code Status Order ID Comments User Context   05/24/2012 1619 05/24/2012 2207 Full Code [id number]  Jerilee Field, RN Inpatient   Advance Care Planning Activity    Advance Directive Documentation   Flowsheet Row Most Recent Value  Type of Advance Directive Healthcare Power of Attorney  Pre-existing out of facility DNR order (yellow form or pink MOST form) --  "MOST" Form in Place? --      Prognosis:   Guarded prognosis, pending biopsy results and response to palliative radiation  Discharge Planning:  Home with Palliative Services and [facility]   Thank you for allowing the Palliative Medicine Team to assist in the care of this patient.  Time in: 10:45am Time out: 11:30am  Time in: 2:00pm Time out: 2:15pm  Total time: 60 minutes  Prolonged time: Yes   Greater than 50% of this time was spent counseling and coordinating care related to the above assessment and plan.  Charleton Deyoung Johnnette Litter, PA-C  Please contact Palliative Medicine Team phone at [phone] for questions and concerns.

## 2020-10-21 NOTE — Progress Notes (Signed)
Orthopedic Tech Progress Note Patient Details:  Jonathan Warner 05/30/1932 [id number] RN called requesting an ABDOMINAL BINDER .. dropped off in room, patient eating breakfast Ortho Devices Type of Ortho Device: Abdominal binder Ortho Device/Splint Location: STOMACH Ortho Device/Splint Interventions: Other (comment)   Post Interventions Patient Tolerated: Other (comment) Instructions Provided: Other (comment)   Janit Pagan 10/21/2020, 10:10 AM

## 2020-10-21 NOTE — Progress Notes (Signed)
NAME:  Jonathan Warner, MRN:  [id number], DOB:  [birth year], LOS: 2 ADMISSION DATE:  10/19/2020, CONSULTATION DATE: 10/20/2020 REFERRING MD: Dr. Sloan Leiter, CHIEF COMPLAINT: Abnormal CT chest  Brief History:  87 year old gentleman admitted for back pain concern for L2 compression fracture versus metastatic disease.  Large lung mass found on CT scan pulmonary consulted for tissue diagnosis management.  History of Present Illness:  This is an 87 year old gentleman, history of prostate cancer, status post surgery and radiation, history of coronary artery disease status post CABG, chronic atrial fibrillation on Eliquis, tachybradycardia syndrome, history of permanent pacemaker, chronic diastolic heart failure presented to the hospital with back pain found to have a L2 compression fracture felt to be pathologic concerning for metastatic disease.  Patient had CT scan of the chest which revealed large lung mass CT scan of the head was completed with and without contrast.  Pacemaker in place unable to complete MRI.  Patient however was on Eliquis and was given Eliquis yesterday at 3:30 PM.  And currently transition to heparin infusion.  Patient is a former smoker quit in the 1980s.  Past Medical History:   Past Medical History:  Diagnosis Date  . Arthritis   . BPH (benign prostatic hyperplasia)   . CAD (coronary artery disease)    s/p CABG 1991  . Carotid artery occlusion   . Cataract    b/l surgery  . Chronic kidney disease (CKD), stage III (moderate) (HCC)   . Diverticulosis   . DJD (degenerative joint disease)   . DJD (degenerative joint disease)   . GERD (gastroesophageal reflux disease)   . Glaucoma   . Hiatal hernia   . History of adenomatous polyp of colon 06/07/13   Last colonoscopy 06/27/13 showed small adenoma, no further testing due to advanced age.  [name] History of radiation therapy 07/25/2012-09/20/2012   prostate 7800 cGy 40 sessions, seminal vesicles 5600 cGy 40 sessions  . History of  shingles   . Hypercholesteremia   . Hyperlipidemia   . Hypertension   . Inguinal hernia   . Osteomyelitis of forearm, right, acute ([place])    drainage x 2  . Persistent atrial fibrillation ([place])   . Prostate cancer ([place]) 04/27/12   Dr. Eulogio Ditch. Radiation therapy. Adenocarcinoma,gleason=3+4=7, 3+3=6,PSA=4.93  volume=51cc  . Pulmonary nodules    small rlung base nodule 4.50mm, scarring in lung bases  . 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 On PCA   Objective   Blood pressure (!) 158/86, pulse 89, temperature 98 F (36.7 C), temperature source Oral, resp. rate 16, height 5\' 8"  (1.727 m), weight 70.3 kg, SpO2 99 %.    FiO2 (%):  [0 %] 0 %  No intake or output data in the 24 hours ending 10/21/20 1754 Filed Weights   10/19/20 1549  Weight: 70.3 kg    Examination: General: eldlery male, resting in bed HENT: tracking appropriately  Lungs: diminshed BL, no wheeze Cardiovascular: RRR, s1 s2  Abdomen: NT ND  Extremities: no edema  Neuro: alert following commands  GU: deferred   Resolved Hospital Problem list     Assessment & Plan:   Large  spiculated right upper lobe mass measuring 4.7 cm patient also has a right lower lobe mass with 2.6 x 4.5 cm possibly representing synchronous primaries.  Small right-sided pleural effusion Right third rib erosive changes concerning for osseous metastatic disease L2 involvement, small epidural component concerning for metastatic disease Former smoker Overall clinical picture is concerning for advanced age bronchogenic carcinoma History of atrial fibrillation on anticoagulation Plan: Eliquis being held  Bronchoscopy tomorrow  at 3pm  We will ensure there is enough material for molecular genetics  Both targets will be planned  Heparin off at 10AM  NPO at midnight Discussed risks and benefits with patient  Labs   CBC: Recent Labs  Lab 10/19/20 1228 10/20/20 0556 10/21/20 0059  WBC 6.7 3.3* 6.8  HGB 12.0* 11.5* 11.6*  HCT 38.2* 34.5* 36.3*  MCV 99.0 96.4 95.5  PLT 184 185 286    Basic Metabolic Panel: Recent Labs  Lab 10/19/20 1228 10/20/20 0556 10/21/20 0059  NA 139 138 137  K 3.0* 4.5 3.8  CL 97* 97* 98  CO2 29 28 26   GLUCOSE 114* 164* 162*  BUN 12 12 19   CREATININE 1.15 1.09 1.23  CALCIUM 9.6 9.7 9.4  MG 1.6*  --   --    GFR: Estimated Creatinine Clearance: 41.7 mL/min (by C-G formula based on SCr of 1.23 mg/dL). Recent Labs  Lab 10/19/20 1228 10/20/20 0556 10/21/20 0059  WBC 6.7 3.3* 6.8    Liver Function Tests: Recent Labs  Lab 10/19/20 1228  AST 23  ALT 11  ALKPHOS 95  BILITOT 1.9*  PROT 6.7  ALBUMIN 3.4*   No results for input(s): LIPASE, AMYLASE in the last 168 hours. No results for input(s): AMMONIA in the last 168 hours.  ABG No results found for: PHART, PCO2ART, PO2ART, HCO3, TCO2, ACIDBASEDEF, O2SAT   Coagulation Profile: No results for input(s): INR, PROTIME in the last 168 hours.  Cardiac Enzymes: No results for input(s): CKTOTAL, CKMB, CKMBINDEX, TROPONINI in the last 168 hours.  HbA1C: No results found for: HGBA1C  CBG: No results for input(s): GLUCAP in the last 168 hours.   [facility] 10/21/2020 5:54 PM

## 2020-10-21 NOTE — Progress Notes (Signed)
ANTICOAGULATION CONSULT NOTE - Follow Up Consult  Pharmacy Consult for IV Heparin Indication: atrial fibrillation  Allergies  Allergen Reactions  . Pilocarpine Other (See Comments)    Made him feel very funny feeling.  . Rosuvastatin Other (See Comments)    Knee and leg pain Knee and leg pain    Patient Measurements: Height: 5\' 8"  (172.7 cm) Weight: 70.3 kg (155 lb) IBW/kg (Calculated) : 68.4  Heparin dosing weight: 70 kg  Vital Signs: Temp: 98.2 F (36.8 C) (02/28 0846) Temp Source: Oral (02/28 0846) BP: 149/79 (02/28 0846) Pulse Rate: 75 (02/28 0846)  Labs: Recent Labs    10/19/20 1228 10/20/20 0556 10/20/20 1696 10/20/20 0838 10/20/20 1621 10/21/20 0059 10/21/20 1006  HGB 12.0* 11.5*  --   --   --  11.6*  --   HCT 38.2* 34.5*  --   --   --  36.3*  --   PLT 184 185  --   --   --  210  --   APTT  --   --  56*   < > 61* 84* 85*  HEPARINUNFRC  --   --  1.14*  --   --  1.12* 1.06*  CREATININE 1.15 1.09  --   --   --  1.23  --    < > = values in this interval not displayed.    Estimated Creatinine Clearance: 41.7 mL/min (by C-G formula based on SCr of 1.23 mg/dL).   Medical History: Past Medical History:  Diagnosis Date  . Arthritis   . BPH (benign prostatic hyperplasia)   . CAD (coronary artery disease)    s/p CABG 1991  . Carotid artery occlusion   . Cataract    b/l surgery  . Chronic kidney disease (CKD), stage III (moderate) (HCC)   . Diverticulosis   . DJD (degenerative joint disease)   . DJD (degenerative joint disease)   . GERD (gastroesophageal reflux disease)   . Glaucoma   . Hiatal hernia   . History of adenomatous polyp of colon 06/07/13   Last colonoscopy 06/27/13 showed small adenoma, no further testing due to advanced age.  [name] History of radiation therapy 07/25/2012-09/20/2012   prostate 7800 cGy 40 sessions, seminal vesicles 5600 cGy 40 sessions  . History of shingles   . Hypercholesteremia   . Hyperlipidemia   . Hypertension   .  Inguinal hernia   . Osteomyelitis of forearm, right, acute ([place])    drainage x 2  . Persistent atrial fibrillation ([place])   . Prostate cancer ([place]) 04/27/12   Dr. Eulogio Ditch. Radiation therapy. Adenocarcinoma,gleason=3+4=7, 3+3=6,PSA=4.93  volume=51cc  . Pulmonary nodules    small rlung base nodule 4.63mm, scarring in lung bases  . 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  Pt was on apixaban PTA for Afib, with plan to hold apixaban and bridge with heparin for procedure (last dose of apixaban was at 1500 on 2/26).  APTT 85 stable and therapeutic. HL continues to be high. H/H, plt stable.  Goal of Therapy:  aPTT:  66-102 sec Heparin level:  0.3-0.7 units/ml Monitor platelets by anticoagulation protocol: Yes   Plan:  Continue heparin at 1150 units/hr F/u aPTT until correlates with heparin level  Monitor daily aPTT,  HL, CBC/plt Monitor for signs/symptoms of bleeding  F/u restart apix when invasive procedures done  Benetta Spar, PharmD, BCPS, [facility] Clinical Pharmacist  Please check AMION for all [facility] phone numbers After 10:00 PM, call [place]

## 2020-10-21 NOTE — H&P (View-Only) (Signed)
NAME:  Jonathan Warner, MRN:  [id number], DOB:  07-27-1932, LOS: 2 ADMISSION DATE:  10/19/2020, CONSULTATION DATE: 10/20/2020 REFERRING MD: Dr. Sloan Leiter, CHIEF COMPLAINT: Abnormal CT chest  Brief History:  87 year old gentleman admitted for back pain concern for L2 compression fracture versus metastatic disease.  Large lung mass found on CT scan pulmonary consulted for tissue diagnosis management.  History of Present Illness:  This is an 87 year old gentleman, history of prostate cancer, status post surgery and radiation, history of coronary artery disease status post CABG, chronic atrial fibrillation on Eliquis, tachybradycardia syndrome, history of permanent pacemaker, chronic diastolic heart failure presented to the hospital with back pain found to have a L2 compression fracture felt to be pathologic concerning for metastatic disease.  Patient had CT scan of the chest which revealed large lung mass CT scan of the head was completed with and without contrast.  Pacemaker in place unable to complete MRI.  Patient however was on Eliquis and was given Eliquis yesterday at 3:30 PM.  And currently transition to heparin infusion.  Patient is a former smoker quit in the 1980s.  Past Medical History:   Past Medical History:  Diagnosis Date  . Arthritis   . BPH (benign prostatic hyperplasia)   . CAD (coronary artery disease)    s/p CABG 1991  . Carotid artery occlusion   . Cataract    b/l surgery  . Chronic kidney disease (CKD), stage III (moderate) (HCC)   . Diverticulosis   . DJD (degenerative joint disease)   . DJD (degenerative joint disease)   . GERD (gastroesophageal reflux disease)   . Glaucoma   . Hiatal hernia   . History of adenomatous polyp of colon 06/07/13   Last colonoscopy 06/27/13 showed small adenoma, no further testing due to advanced age.  [name] History of radiation therapy 07/25/2012-09/20/2012   prostate 7800 cGy 40 sessions, seminal vesicles 5600 cGy 40 sessions  . History of  shingles   . Hypercholesteremia   . Hyperlipidemia   . Hypertension   . Inguinal hernia   . Osteomyelitis of forearm, right, acute ([place])    drainage x 2  . Persistent atrial fibrillation ([place])   . Prostate cancer ([place]) 04/27/12   Dr. Eulogio Ditch. Radiation therapy. Adenocarcinoma,gleason=3+4=7, 3+3=6,PSA=4.93  volume=51cc  . Pulmonary nodules    small rlung base nodule 4.42mm, scarring in lung bases  . 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 On PCA   Objective   Blood pressure (!) 158/86, pulse 89, temperature 98 F (36.7 C), temperature source Oral, resp. rate 16, height 5\' 8"  (1.727 m), weight 70.3 kg, SpO2 99 %.    FiO2 (%):  [0 %] 0 %  No intake or output data in the 24 hours ending 10/21/20 1754 Filed Weights   10/19/20 1549  Weight: 70.3 kg    Examination: General: eldlery male, resting in bed HENT: tracking appropriately  Lungs: diminshed BL, no wheeze Cardiovascular: RRR, s1 s2  Abdomen: NT ND  Extremities: no edema  Neuro: alert following commands  GU: deferred   Resolved Hospital Problem list     Assessment & Plan:   Large  spiculated right upper lobe mass measuring 4.7 cm patient also has a right lower lobe mass with 2.6 x 4.5 cm possibly representing synchronous primaries.  Small right-sided pleural effusion Right third rib erosive changes concerning for osseous metastatic disease L2 involvement, small epidural component concerning for metastatic disease Former smoker Overall clinical picture is concerning for advanced age bronchogenic carcinoma History of atrial fibrillation on anticoagulation Plan: Eliquis being held  Bronchoscopy tomorrow  at 3pm  We will ensure there is enough material for molecular genetics  Both targets will be planned  Heparin off at 10AM  NPO at midnight Discussed risks and benefits with patient  Labs   CBC: Recent Labs  Lab 10/19/20 1228 10/20/20 0556 10/21/20 0059  WBC 6.7 3.3* 6.8  HGB 12.0* 11.5* 11.6*  HCT 38.2* 34.5* 36.3*  MCV 99.0 96.4 95.5  PLT 184 185 638    Basic Metabolic Panel: Recent Labs  Lab 10/19/20 1228 10/20/20 0556 10/21/20 0059  NA 139 138 137  K 3.0* 4.5 3.8  CL 97* 97* 98  CO2 29 28 26   GLUCOSE 114* 164* 162*  BUN 12 12 19   CREATININE 1.15 1.09 1.23  CALCIUM 9.6 9.7 9.4  MG 1.6*  --   --    GFR: Estimated Creatinine Clearance: 41.7 mL/min (by C-G formula based on SCr of 1.23 mg/dL). Recent Labs  Lab 10/19/20 1228 10/20/20 0556 10/21/20 0059  WBC 6.7 3.3* 6.8    Liver Function Tests: Recent Labs  Lab 10/19/20 1228  AST 23  ALT 11  ALKPHOS 95  BILITOT 1.9*  PROT 6.7  ALBUMIN 3.4*   No results for input(s): LIPASE, AMYLASE in the last 168 hours. No results for input(s): AMMONIA in the last 168 hours.  ABG No results found for: PHART, PCO2ART, PO2ART, HCO3, TCO2, ACIDBASEDEF, O2SAT   Coagulation Profile: No results for input(s): INR, PROTIME in the last 168 hours.  Cardiac Enzymes: No results for input(s): CKTOTAL, CKMB, CKMBINDEX, TROPONINI in the last 168 hours.  HbA1C: No results found for: HGBA1C  CBG: No results for input(s): GLUCAP in the last 168 hours.   [facility] 10/21/2020 5:54 PM

## 2020-10-21 NOTE — Evaluation (Signed)
Occupational Therapy Evaluation Patient Details Name: Jonathan Warner MRN: [id number] DOB: 02-17-1932 Today's Date: 10/21/2020    History of Present Illness Jonathan Warner is a 87 y.o. male with medical history significant of remote history of prostate CA status post surgical removal and radiation, CAD s/p CABG, chronic A. fib on Eliquis, tachybradycardia syndrome status post PPM, chronic diastolic CHF, recently diagnosed L2 metastatic CA and pathologic fracture presented with worsening of back pain and ambulation dysfunction.   Clinical Impression   Pt was using a RW prior to admission and having greater difficulty with ADL due to pain and fatigue. Pt continues to have pain in back and groin, RN setting up PCA during session. Pt requires set up to max assist for ADL. Pt wants to be as independent as possible when returning home. Pt with requiring manual shifting to manage scrotum during mobility, offered to make pt a scrotal sling, pt will consider.     Follow Up Recommendations  Home health OT;Supervision/Assistance - 24 hour    Equipment Recommendations  Hospital bed    Recommendations for Other Services       Precautions / Restrictions Precautions Precautions: Fall;Other (comment) (back precautions for comfort) Required Braces or Orthoses: Other Brace Other Brace: abdominal binder worn very low      Mobility Bed Mobility Overal bed mobility: Needs Assistance Bed Mobility: Rolling;Sidelying to Sit;Sit to Sidelying Rolling: Min guard Sidelying to sit: Min assist    Sit to sidelying: Mod assist General bed mobility comments: cues for log roll for comfort, assist to raise trunk and for LEs back into bed    Transfers Overall transfer level: Needs assistance Equipment used: Rolling walker (2 wheeled) Transfers: Sit to/from Stand Sit to Stand: Min guard (for safety)             Balance Overall balance assessment: Needs assistance   Sitting balance-Leahy Scale: Fair        Standing balance-Leahy Scale: Poor                             ADL either performed or assessed with clinical judgement   ADL Overall ADL's : Needs assistance/impaired Eating/Feeding: Independent;Sitting   Grooming: Set up;Sitting   Upper Body Bathing: Minimal assistance;Sitting   Lower Body Bathing: Maximal assistance;Sit to/from stand   Upper Body Dressing : Minimal assistance;Sitting   Lower Body Dressing: Maximal assistance;Sit to/from stand                 General ADL Comments: Educated pt in possibility of a scrotal sling, pt preferring to use his underwear for support, but will consider.     Vision Patient Visual Report: No change from baseline       Perception     Praxis      Pertinent Vitals/Pain Pain Assessment: Faces Faces Pain Scale: Hurts little more Pain Location: groin Pain Descriptors / Indicators: Sore;Grimacing;Guarding Pain Intervention(s): Monitored during session;Repositioned (RN setting up PCA)     Hand Dominance Right   Extremity/Trunk Assessment Upper Extremity Assessment Upper Extremity Assessment: Overall WFL for tasks assessed   Lower Extremity Assessment Lower Extremity Assessment: Defer to PT evaluation   Cervical / Trunk Assessment Cervical / Trunk Assessment: Other exceptions Cervical / Trunk Exceptions: significant pain   Communication Communication Communication: No difficulties   Cognition Arousal/Alertness: Awake/alert Behavior During Therapy: WFL for tasks assessed/performed Overall Cognitive Status: Within Functional Limits for tasks assessed  General Comments: pt needs to think about recommendations before agreeing   General Comments      Exercises     Shoulder Instructions      Home Living Family/patient expects to be discharged to:: Private residence Living Arrangements: Spouse/significant other Available Help at Discharge: Family Type of  Home: House Home Access: Stairs to enter Technical brewer of Steps: 3   Home Layout: One level     Bathroom Shower/Tub: Occupational psychologist: Handicapped height     Home Equipment: Environmental consultant - 2 wheels;Bedside commode;Shower seat;Grab bars - toilet;Grab bars - tub/shower;Hand held shower head (shower seat does not have a back)          Prior Functioning/Environment Level of Independence: Needs assistance  Gait / Transfers Assistance Needed: walks with RW ADL's / Homemaking Assistance Needed: lately, showering has been tiresome as he tries to also clean the shower, stands when showering except to do his feet.            OT Problem List: Decreased strength;Impaired balance (sitting and/or standing);Decreased knowledge of use of DME or AE;Pain      OT Treatment/Interventions: Self-care/ADL training;DME and/or AE instruction;Patient/family education;Balance training;Therapeutic activities    OT Goals(Current goals can be found in the care plan section) Acute Rehab OT Goals Patient Stated Goal: decr pain OT Goal Formulation: With patient Time For Goal Achievement: 11/04/20 Potential to Achieve Goals: Good ADL Goals Pt Will Perform Grooming: with supervision;standing Pt Will Perform Lower Body Bathing: with supervision;with adaptive equipment;sit to/from stand Pt Will Perform Lower Body Dressing: with supervision;with adaptive equipment;sit to/from stand Pt Will Transfer to Toilet: with supervision;ambulating;bedside commode (over toilet) Pt Will Perform Toileting - Clothing Manipulation and hygiene: with supervision;sit to/from stand Additional ADL Goal #1: Pt will perform bed mobility with log roll technique for comfort.  OT Frequency: Min 2X/week   Barriers to D/C:            Co-evaluation              AM-PAC OT "6 Clicks" Daily Activity     Outcome Measure Help from another person eating meals?: None Help from another person taking care of  personal grooming?: A Little Help from another person toileting, which includes using toliet, bedpan, or urinal?: A Little Help from another person bathing (including washing, rinsing, drying)?: A Lot Help from another person to put on and taking off regular upper body clothing?: A Little Help from another person to put on and taking off regular lower body clothing?: Total 6 Click Score: 16   End of Session Equipment Utilized During Treatment: Gait belt;Rolling walker  Activity Tolerance: Patient limited by pain Patient left: in bed;with call bell/phone within reach;with nursing/sitter in room;with family/visitor present  OT Visit Diagnosis: Other abnormalities of gait and mobility (R26.89);Pain;Muscle weakness (generalized) (M62.81)                Time: 7017-7939 OT Time Calculation (min): 18 min Charges:  OT General Charges $OT Visit: 1 Visit OT Evaluation $OT Eval Moderate Complexity: 1 Mod  Nestor Lewandowsky, OTR/L Acute Rehabilitation Services Pager: [phone] Office: [phone]  [name] 10/21/2020, 2:54 PM

## 2020-10-21 NOTE — Progress Notes (Signed)
Overall, everything is holding steady.  His pain seems to be doing better.  He really needs to be seen by orthopedic surgery so that they can recommend how to stabilize his back.  I would think he needs a back brace.  He has been seen by physical therapy.  I really appreciate their help and input.  He needs to be seen by interventional radiology to see if they can do a vertebroplasty at L2 to try to help stabilize the vertebral body.  I sent a message to Dr. Sondra Come of Radiation Oncology so that they can see the patient.  He will have his bronchoscopy today.  I truly hope that we will be able to get enough tissue to do all of our molecular markers.  He has not smoked for 40 years.  This certainly looks like a bronchogenic carcinoma.  I would think though he may have a mutation that we might be able to target.  The CT of the brain is negative.  He is on heparin right now.  We will keep him on heparin until we know all the invasive studies are done and then get him back onto his Eliquis for his atrial fibrillation.  Again, this is all about quality of life.  He has stage IV disease.  We are not going to cure this.  Anything that we can do to help him stay functional and be mobile would be the priority.  As such, this is where orthopedic surgery would be very helpful for recommendations.  He may need a back brace.  They can recommend what kind to use.  We will follow along.  I appreciate the great care that he is getting by all the staff up on [address].   Lattie Haw, MD  Jonathan Warner 41:10

## 2020-10-21 NOTE — Progress Notes (Signed)
ANTICOAGULATION CONSULT NOTE - Follow Up Consult  Pharmacy Consult for IV Heparin Indication: atrial fibrillation  Allergies  Allergen Reactions  . Pilocarpine Other (See Comments)    Made him feel very funny feeling.  . Rosuvastatin Other (See Comments)    Knee and leg pain Knee and leg pain    Patient Measurements: Height: 5\' 8"  (172.7 cm) Weight: 70.3 kg (155 lb) IBW/kg (Calculated) : 68.4  Heparin dosing weight: 70 kg  Vital Signs: Temp: 98.6 F (37 C) (02/27 2050) Temp Source: Oral (02/27 2050) BP: 152/58 (02/27 2050) Pulse Rate: 76 (02/27 2050)  Labs: Recent Labs    10/19/20 1228 10/20/20 0556 10/20/20 0838 10/20/20 1621 10/21/20 0059  HGB 12.0* 11.5*  --   --  11.6*  HCT 38.2* 34.5*  --   --  36.3*  PLT 184 185  --   --  210  APTT  --   --  56* 61* 84*  HEPARINUNFRC  --   --  1.14*  --  1.12*  CREATININE 1.15 1.09  --   --  1.23    Estimated Creatinine Clearance: 41.7 mL/min (by C-G formula based on SCr of 1.23 mg/dL).   Medical History: Past Medical History:  Diagnosis Date  . Arthritis   . BPH (benign prostatic hyperplasia)   . CAD (coronary artery disease)    s/p CABG 1991  . Carotid artery occlusion   . Cataract    b/l surgery  . Chronic kidney disease (CKD), stage III (moderate) (HCC)   . Diverticulosis   . DJD (degenerative joint disease)   . DJD (degenerative joint disease)   . GERD (gastroesophageal reflux disease)   . Glaucoma   . Hiatal hernia   . History of adenomatous polyp of colon 06/07/13   Last colonoscopy 06/27/13 showed small adenoma, no further testing due to advanced age.  [name] History of radiation therapy 07/25/2012-09/20/2012   prostate 7800 cGy 40 sessions, seminal vesicles 5600 cGy 40 sessions  . History of shingles   . Hypercholesteremia   . Hyperlipidemia   . Hypertension   . Inguinal hernia   . Osteomyelitis of forearm, right, acute ([place])    drainage x 2  . Persistent atrial fibrillation ([place])   . Prostate cancer  ([place]) 04/27/12   Dr. Eulogio Ditch. Radiation therapy. Adenocarcinoma,gleason=3+4=7, 3+3=6,PSA=4.93  volume=51cc  . Pulmonary nodules    small rlung base nodule 4.19mm, scarring in lung bases  . 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  Pt was on apixaban PTA for Afib, with plan to hold apixaban and bridge with heparin for procedure (last dose of apixaban was at 1500 on 2/26).  Apixaban can falsely increase heparin level, so will monitor anticoagulation with aPTT until aPTT and heparin levels correlate.   2/28 AM update:  APTT therapeutic after rate increase  Goal of Therapy:  aPTT:  66-102 sec Heparin level:  0.3-0.7 units/ml Monitor platelets by anticoagulation protocol: Yes   Plan:  Cont heparin at 1150 units/hr 1000 aPTT/heparin level  Narda Bonds, PharmD, BCPS Clinical Pharmacist Phone: [phone]

## 2020-10-21 NOTE — Progress Notes (Signed)
Initial Nutrition Assessment  DOCUMENTATION CODES:   Not applicable  INTERVENTION:   -Boost Breeze po TID, each supplement provides 250 kcal and 9 grams of protein -MVI with minerals daily  NUTRITION DIAGNOSIS:   Inadequate oral intake related to decreased appetite as evidenced by per patient/family report.  GOAL:   Patient will meet greater than or equal to 90% of their needs  MONITOR:   PO intake,Supplement acceptance,Labs,Weight trends,Skin,I & O's  REASON FOR ASSESSMENT:   Malnutrition Screening Tool    ASSESSMENT:   Jonathan Warner is a 87 y.o. male with medical history significant of remote history of prostate CA status post surgical removal and radiation, CAD s/p CABG, chronic A. fib on Eliquis, tachybradycardia syndrome status post PPM, chronic diastolic CHF, recently diagnosed L2 metastatic CA and pathologic fracture presented with worsening of back pain and ambulation dysfunction.  Pt admitted with acute ambulation dysfunction secondary to pathological L2 fracture.   Reviewed I/O's: +194 ml x 24 hours and +396 ml since admission  Spoke with pt, son, and wife at bedside. Pt wife reports pt has experienced a general decline in health over the past 2 months, including increased falls, weakness, and decreased appetite. Pt shares he has a hearty appetite at baseline and typically consumes 3 meals and several snacks daily. However, over the past 2 months, he reports he has stopped snacking and food is often not appealing to him. He reports that he has been experiencing taste changes, including food tasting too salty (pt wife explains that they have reduced sodium in his diet to avoid fluid retention secondary to CHF). He consumed about 25% of his breakfast. Pt son also reports that pt's meal times are often interrupted due to multiple tests and procedures.   Pt shares that his UBW is around 210#. He endorses some weight loss secondary to fluid losses, however, suspects some  weight loss related to decreased oral intake, but unsure of amount. Noted pt has experienced a 16.2% wt loss over the past 6 months. Pt, wife, and son both agree that some of this weight loss is likely related to fluid losses from CHF.   Pt became tearful at time of visit; RD allowed pt to express feelings regarding new diagnosis of lung cancer. Provided emotional support and comfort to pt. Pt shares his great family support system.   Discussed importance of good meal and supplement intake to promote healing. Pt does not tolerate lactose products well and does not like Ensure. They are amenable to [facility].   Medications reviewed and include vitamin D3, decadron, lasix, miralax, senna, and KCl.   Labs reviewed.   NUTRITION - FOCUSED PHYSICAL EXAM:  Flowsheet Row Most Recent Value  Orbital Region No depletion  Upper Arm Region No depletion  Thoracic and Lumbar Region No depletion  Buccal Region No depletion  Temple Region No depletion  Clavicle Bone Region No depletion  Clavicle and Acromion Bone Region No depletion  Scapular Bone Region No depletion  Dorsal Hand No depletion  Patellar Region No depletion  Anterior Thigh Region No depletion  Posterior Calf Region No depletion  Edema (RD Assessment) Mild  Hair Reviewed  Eyes Reviewed  Mouth Reviewed  Skin Reviewed  Nails Reviewed       Diet Order:   Diet Order            Diet NPO time specified  Diet effective midnight           Diet Heart Room service appropriate?  Yes; Fluid consistency: Thin  Diet effective now                 EDUCATION NEEDS:   Education needs have been addressed  Skin:  Skin Assessment: Reviewed RN Assessment  Last BM:  10/19/20  Height:   Ht Readings from Last 1 Encounters:  10/19/20 5\' 8"  (1.727 m)    Weight:   Wt Readings from Last 1 Encounters:  10/19/20 70.3 kg    Ideal Body Weight:  70 kg  BMI:  Body mass index is 23.57 kg/m.  Estimated Nutritional Needs:   Kcal:   9022-8406  Protein:  90-105 grams  Fluid:  > 1.7 L    Loistine Chance, RD, LDN, [place] Registered Dietitian II Certified Diabetes Care and Education Specialist Please refer to [facility] for RD and/or RD on-call/weekend/after hours pager

## 2020-10-21 NOTE — Progress Notes (Signed)
Physical Therapy Treatment Patient Details Name: Jonathan Warner MRN: [id number] DOB: Nov 23, 1931 Today's Date: 10/21/2020    History of Present Illness Jonathan Warner is a 87 y.o. male with medical history significant of remote history of prostate CA status post surgical removal and radiation, CAD s/p CABG, chronic A. fib on Eliquis, tachybradycardia syndrome status post PPM, chronic diastolic CHF, recently diagnosed L2 metastatic CA and pathologic fracture presented with worsening of back pain and ambulation dysfunction.    PT Comments    Continuing work on functional mobility and activity tolerance;  Session focused on using the abdominal binder to see if it is useful to make sit<>stand transitions more tolerable -- and noted some success with less grimace and stopping in pain compared to last session; Discussed possibly trimming the bottom of the binder in front for more comfort in the seated position;  Extra time for positioning for comfort once back in bed; Lengthy discussion re: considerations for dc home including hospital bed, possible HH therapies to help with home setup, adjustments that can be made to abdominal binder, and possibility that a scrotal sling will help with comfort while moving; Also discussed palliative kyphoplasty, and encouraged pt and family to continue to discuss with his doctors  Follow Up Recommendations  Home health PT     Equipment Recommendations  3in1 (PT) Consider a Hospital Bed   Recommendations for Other Services OT consult     Precautions / Restrictions Precautions Precautions: Fall;Other (comment) (Plan to try to follow back precautions for comfort) Required Braces or Orthoses: Other Brace Other Brace: Pt's son to bring in his brace from home; Abdominal binder in room (worn very low) is also helpful    Mobility  Bed Mobility Overal bed mobility: Needs Assistance Bed Mobility: Supine to Sit;Rolling Rolling: Min guard   Supine to sit: Min assist  (bed near flat)     General bed mobility comments: Pt performed log roll to side, and then requested we "hook arms" to help him up; attemtped and cued for straight sidelie to sit transfer to get up, however pt tending to lean top shoulder posteriorly in more of a semi-supine to sit transfer; Assisted pt with getting second leg back into bed    Transfers Overall transfer level: Needs assistance Equipment used: Rolling walker (2 wheeled) Transfers: Sit to/from Stand Sit to Stand: Min guard (without physical contact)         General transfer comment: Cue for pre-positioning and posture so that he can keep upper body more upright, with hopes of decreasing pain with sit<>stand transitions; No grimace with sit<>stand today  Ambulation/Gait Ambulation/Gait assistance: Min guard Gait Distance (Feet): 5 Feet Assistive device: Rolling walker (2 wheeled) Gait Pattern/deviations: Step-through pattern     General Gait Details: bears down into RW to take pressure off his back   Stairs             Wheelchair Mobility    Modified Rankin (Stroke Patients Only)       Balance     Sitting balance-Leahy Scale: Fair       Standing balance-Leahy Scale: Poor                              Cognition Arousal/Alertness: Awake/alert Behavior During Therapy: WFL for tasks assessed/performed Overall Cognitive Status: Within Functional Limits for tasks assessed  Exercises      General Comments General comments (skin integrity, edema, etc.): Extra time for positioning for comfort once back in bed; Lengthy discussion re: considerations for dc home including hospital bed, possible HH therapies to help with home setup, adjustments taht can be made to abdominal binder, and possibility that a scrotal sling will help with comfort while moving; Also discussed kyphoplasty      Pertinent Vitals/Pain Pain Assessment:  Faces Faces Pain Scale: Hurts little more Pain Location: Low back pain; noting much less grimace with sit<>stand transitions than previous day Pain Descriptors / Indicators: Grimacing Pain Intervention(s): Monitored during session;Other (comment) (tried using abdominal binder worn very low for extra suport with transitions)    Home Living                      Prior Function            PT Goals (current goals can now be found in the care plan section) Acute Rehab PT Goals Patient Stated Goal: decr pain PT Goal Formulation: With patient Time For Goal Achievement: 11/03/20 Potential to Achieve Goals: Good Progress towards PT goals: Progressing toward goals    Frequency    Min 3X/week      PT Plan Current plan remains appropriate    Co-evaluation              AM-PAC PT "6 Clicks" Mobility   Outcome Measure  Help needed turning from your back to your side while in a flat bed without using bedrails?: A Little Help needed moving from lying on your back to sitting on the side of a flat bed without using bedrails?: A Little Help needed moving to and from a bed to a chair (including a wheelchair)?: A Little Help needed standing up from a chair using your arms (e.g., wheelchair or bedside chair)?: A Little Help needed to walk in hospital room?: None Help needed climbing 3-5 steps with a railing? : A Little 6 Click Score: 19    End of Session Equipment Utilized During Treatment: Other (comment) (abdominal binder) Activity Tolerance: Patient tolerated treatment well Patient left: in bed;with call bell/phone within reach;with family/visitor present Nurse Communication: Mobility status PT Visit Diagnosis: Unsteadiness on feet (R26.81);Other abnormalities of gait and mobility (R26.89);Pain Pain - part of body:  (Back)     Time: 1051-1140 PT Time Calculation (min) (ACUTE ONLY): 49 min  Charges:  $Gait Training: 8-22 mins $Therapeutic Activity: 8-22 mins $Self  Care/Home Management: [place], PT  Acute Rehabilitation Services Pager [phone] Office [phone]    Colletta [place] 10/21/2020, 2:06 PM

## 2020-10-22 ENCOUNTER — Inpatient Hospital Stay (HOSPITAL_COMMUNITY): Payer: Medicare Other

## 2020-10-22 ENCOUNTER — Ambulatory Visit
Admit: 2020-10-22 | Discharge: 2020-10-22 | Disposition: A | Discharge status: Home / Self Care | Payer: Medicare Other | Source: Ambulatory Visit | Attending: Radiation Oncology | Admitting: Radiation Oncology

## 2020-10-22 ENCOUNTER — Inpatient Hospital Stay (HOSPITAL_COMMUNITY): Payer: Medicare Other | Admitting: Certified Registered"

## 2020-10-22 ENCOUNTER — Encounter (HOSPITAL_COMMUNITY)
Admission: EM | Disposition: A | Discharge status: Home Health | Payer: Self-pay | Source: Home / Self Care | Attending: Internal Medicine

## 2020-10-22 ENCOUNTER — Encounter (HOSPITAL_COMMUNITY): Payer: Self-pay | Admitting: Internal Medicine

## 2020-10-22 ENCOUNTER — Telehealth: Payer: Self-pay | Admitting: Internal Medicine

## 2020-10-22 DIAGNOSIS — C3411 Malignant neoplasm of upper lobe, right bronchus or lung: Secondary | ICD-10-CM | POA: Insufficient documentation

## 2020-10-22 DIAGNOSIS — C3431 Malignant neoplasm of lower lobe, right bronchus or lung: Secondary | ICD-10-CM | POA: Diagnosis not present

## 2020-10-22 DIAGNOSIS — G893 Neoplasm related pain (acute) (chronic): Secondary | ICD-10-CM | POA: Diagnosis not present

## 2020-10-22 DIAGNOSIS — R918 Other nonspecific abnormal finding of lung field: Secondary | ICD-10-CM | POA: Diagnosis present

## 2020-10-22 DIAGNOSIS — C7951 Secondary malignant neoplasm of bone: Secondary | ICD-10-CM | POA: Diagnosis not present

## 2020-10-22 DIAGNOSIS — Q539 Undescended testicle, unspecified: Secondary | ICD-10-CM

## 2020-10-22 DIAGNOSIS — R140 Abdominal distension (gaseous): Secondary | ICD-10-CM

## 2020-10-22 DIAGNOSIS — M8458XA Pathological fracture in neoplastic disease, other specified site, initial encounter for fracture: Secondary | ICD-10-CM | POA: Diagnosis not present

## 2020-10-22 DIAGNOSIS — R262 Difficulty in walking, not elsewhere classified: Secondary | ICD-10-CM | POA: Diagnosis not present

## 2020-10-22 HISTORY — PX: BRONCHIAL BRUSHINGS: SHX5108

## 2020-10-22 HISTORY — PX: VIDEO BRONCHOSCOPY WITH ENDOBRONCHIAL NAVIGATION: SHX6175

## 2020-10-22 HISTORY — PX: ENDOBRONCHIAL ULTRASOUND: SHX5096

## 2020-10-22 HISTORY — PX: BRONCHIAL NEEDLE ASPIRATION BIOPSY: SHX5106

## 2020-10-22 HISTORY — PX: BRONCHIAL BIOPSY: SHX5109

## 2020-10-22 LAB — MULTIPLE MYELOMA PANEL, SERUM
Albumin SerPl Elph-Mcnc: 3.3 g/dL (ref 2.9–4.4)
Albumin/Glob SerPl: 1.2 (ref 0.7–1.7)
Alpha 1: 0.3 g/dL (ref 0.0–0.4)
Alpha2 Glob SerPl Elph-Mcnc: 0.8 g/dL (ref 0.4–1.0)
B-Globulin SerPl Elph-Mcnc: 0.9 g/dL (ref 0.7–1.3)
Gamma Glob SerPl Elph-Mcnc: 0.8 g/dL (ref 0.4–1.8)
Globulin, Total: 2.9 g/dL (ref 2.2–3.9)
IgA: 247 mg/dL (ref 61–437)
IgG (Immunoglobin G), Serum: 884 mg/dL (ref 603–1613)
IgM (Immunoglobulin M), Srm: 35 mg/dL (ref 15–143)
Total Protein ELP: 6.2 g/dL (ref 6.0–8.5)

## 2020-10-22 LAB — BASIC METABOLIC PANEL
Anion gap: 10 (ref 5–15)
BUN: 24 mg/dL — ABNORMAL HIGH (ref 8–23)
CO2: 29 mmol/L (ref 22–32)
Calcium: 8.5 mg/dL — ABNORMAL LOW (ref 8.9–10.3)
Chloride: 97 mmol/L — ABNORMAL LOW (ref 98–111)
Creatinine, Ser: 1.17 mg/dL (ref 0.61–1.24)
GFR, Estimated: >60 mL/min (ref >60)
Glucose, Bld: 181 mg/dL — ABNORMAL HIGH (ref 70–99)
Potassium: 3.7 mmol/L (ref 3.5–5.1)
Sodium: 136 mmol/L (ref 135–145)

## 2020-10-22 LAB — CBC
HCT: 34.3 % — ABNORMAL LOW (ref 39.0–52.0)
Hemoglobin: 10.8 g/dL — ABNORMAL LOW (ref 13.0–17.0)
MCH: 30.8 pg (ref 26.0–34.0)
MCHC: 31.5 g/dL (ref 30.0–36.0)
MCV: 97.7 fL (ref 80.0–100.0)
Platelets: 191 10*3/uL (ref 150–400)
RBC: 3.51 MIL/uL — ABNORMAL LOW (ref 4.22–5.81)
RDW: 14.1 % (ref 11.5–15.5)
WBC: 6.7 10*3/uL (ref 4.0–10.5)
nRBC: 0 % (ref 0.0–0.2)

## 2020-10-22 LAB — HEPARIN LEVEL (UNFRACTIONATED): Heparin Unfractionated: 0.99 IU/mL — ABNORMAL HIGH (ref 0.30–0.70)

## 2020-10-22 LAB — MRSA PCR SCREENING: MRSA by PCR: NEGATIVE

## 2020-10-22 LAB — APTT: aPTT: 102 seconds — ABNORMAL HIGH (ref 24–36)

## 2020-10-22 LAB — LACTATE DEHYDROGENASE: LDH: 174 U/L (ref 98–192)

## 2020-10-22 SURGERY — VIDEO BRONCHOSCOPY WITH ENDOBRONCHIAL NAVIGATION
Anesthesia: General | Laterality: Right

## 2020-10-22 MED ORDER — ROCURONIUM BROMIDE 10 MG/ML (PF) SYRINGE
PREFILLED_SYRINGE | INTRAVENOUS | Status: DC | PRN
  Administered 2020-10-22: 10 mg via INTRAVENOUS
  Administered 2020-10-22: 60 mg via INTRAVENOUS

## 2020-10-22 MED ORDER — SUGAMMADEX SODIUM 200 MG/2ML IV SOLN
INTRAVENOUS | Status: DC | PRN
  Administered 2020-10-22: 200 mg via INTRAVENOUS

## 2020-10-22 MED ORDER — LACTATED RINGERS IV SOLN: INTRAVENOUS | Status: DC

## 2020-10-22 MED ORDER — FENTANYL CITRATE (PF) 100 MCG/2ML IJ SOLN
INTRAMUSCULAR | Status: DC | PRN
  Administered 2020-10-22: 100 ug via INTRAVENOUS

## 2020-10-22 MED ORDER — CHLORHEXIDINE GLUCONATE 0.12 % MT SOLN
OROMUCOSAL | Status: AC
  Administered 2020-10-22: 15 mL
  Filled 2020-10-22: qty 15

## 2020-10-22 MED ORDER — PHENYLEPHRINE HCL-NACL 10-0.9 MG/250ML-% IV SOLN
INTRAVENOUS | Status: DC | PRN
  Administered 2020-10-22: 15 ug/min via INTRAVENOUS

## 2020-10-22 MED ORDER — LIDOCAINE 2% (20 MG/ML) 5 ML SYRINGE
INTRAMUSCULAR | Status: DC | PRN
  Administered 2020-10-22: 60 mg via INTRAVENOUS

## 2020-10-22 MED ORDER — HEPARIN (PORCINE) 25000 UT/250ML-% IV SOLN
1100.0000 [IU]/h | INTRAVENOUS | Status: DC
  Administered 2020-10-22: 23:00:00 1100 [IU]/h via INTRAVENOUS
  Administered 2020-10-23: 1200 [IU]/h via INTRAVENOUS
  Filled 2020-10-22 (×2): qty 250

## 2020-10-22 MED ORDER — TECHNETIUM TC 99M MEDRONATE IV KIT
20.0000 | PACK | Freq: Once | INTRAVENOUS | Status: AC | PRN
  Administered 2020-10-22: 21.3 via INTRAVENOUS

## 2020-10-22 MED ORDER — PROPOFOL 10 MG/ML IV BOLUS
INTRAVENOUS | Status: DC | PRN
  Administered 2020-10-22: 130 mg via INTRAVENOUS

## 2020-10-22 MED ORDER — DEXAMETHASONE SODIUM PHOSPHATE 10 MG/ML IJ SOLN
INTRAMUSCULAR | Status: DC | PRN
  Administered 2020-10-22: 10 mg via INTRAVENOUS

## 2020-10-22 MED ORDER — ONDANSETRON HCL 4 MG/2ML IJ SOLN
INTRAMUSCULAR | Status: DC | PRN
  Administered 2020-10-22: 4 mg via INTRAVENOUS

## 2020-10-22 SURGICAL SUPPLY — 46 items
ADAPTER BRONCH F/PENTAX (ADAPTER) ×4 IMPLANT
ADAPTER VALVE BIOPSY EBUS (MISCELLANEOUS) IMPLANT
ADPR BSCP EDG PNTX (ADAPTER) ×3
ADPTR VALVE BIOPSY EBUS (MISCELLANEOUS)
BRUSH CYTOL CELLEBRITY 1.5X140 (MISCELLANEOUS) ×4 IMPLANT
BRUSH SUPERTRAX BIOPSY (INSTRUMENTS) IMPLANT
BRUSH SUPERTRAX NDL-TIP CYTO (INSTRUMENTS) ×4 IMPLANT
CANISTER SUCT 3000ML PPV (MISCELLANEOUS) ×4 IMPLANT
CHANNEL WORK EXTEND EDGE 180 (KITS) IMPLANT
CHANNEL WORK EXTEND EDGE 45 (KITS) IMPLANT
CHANNEL WORK EXTEND EDGE 90 (KITS) IMPLANT
CONT SPEC 4OZ CLIKSEAL STRL BL (MISCELLANEOUS) ×4 IMPLANT
COVER BACK TABLE 60X90IN (DRAPES) ×4 IMPLANT
FILTER STRAW FLUID ASPIR (MISCELLANEOUS) IMPLANT
FORCEPS BIOP SUPERTRX PREMAR (INSTRUMENTS) ×4 IMPLANT
GAUZE SPONGE 4X4 12PLY STRL (GAUZE/BANDAGES/DRESSINGS) ×4 IMPLANT
GLOVE SURG SS PI 7.5 STRL IVOR (GLOVE) ×8 IMPLANT
GOWN STRL REUS W/ TWL LRG LVL3 (GOWN DISPOSABLE) ×6 IMPLANT
GOWN STRL REUS W/TWL LRG LVL3 (GOWN DISPOSABLE) ×8
KIT CLEAN ENDO COMPLIANCE (KITS) ×4 IMPLANT
KIT LOCATABLE GUIDE (CANNULA) IMPLANT
KIT MARKER FIDUCIAL DELIVERY (KITS) IMPLANT
KIT PROCEDURE EDGE 180 (KITS) IMPLANT
KIT PROCEDURE EDGE 45 (KITS) IMPLANT
KIT PROCEDURE EDGE 90 (KITS) IMPLANT
KIT TURNOVER KIT B (KITS) ×4 IMPLANT
MARKER SKIN DUAL TIP RULER LAB (MISCELLANEOUS) ×4 IMPLANT
NDL SUPERTRX PREMARK BIOPSY (NEEDLE) ×3 IMPLANT
NEEDLE SUPERTRX PREMARK BIOPSY (NEEDLE) ×4 IMPLANT
NS IRRIG 1000ML POUR BTL (IV SOLUTION) ×4 IMPLANT
OIL SILICONE PENTAX (PARTS (SERVICE/REPAIRS)) ×4 IMPLANT
PAD ARMBOARD 7.5X6 YLW CONV (MISCELLANEOUS) ×8 IMPLANT
PATCHES PATIENT (LABEL) ×12 IMPLANT
SOL ANTI FOG 6CC (MISCELLANEOUS) ×3 IMPLANT
SOLUTION ANTI FOG 6CC (MISCELLANEOUS) ×1
SYR 20CC LL (SYRINGE) ×4 IMPLANT
SYR 20ML ECCENTRIC (SYRINGE) ×4 IMPLANT
SYR 50ML SLIP (SYRINGE) ×4 IMPLANT
TOWEL OR 17X24 6PK STRL BLUE (TOWEL DISPOSABLE) ×4 IMPLANT
TRAP SPECIMEN MUCOUS 40CC (MISCELLANEOUS) IMPLANT
TUBE CONNECTING 20X1/4 (TUBING) ×4 IMPLANT
UNDERPAD 30X30 (UNDERPADS AND DIAPERS) ×4 IMPLANT
VALVE BIOPSY  SINGLE USE (MISCELLANEOUS) ×4
VALVE BIOPSY SINGLE USE (MISCELLANEOUS) ×3 IMPLANT
VALVE SUCTION BRONCHIO DISP (MISCELLANEOUS) ×4 IMPLANT
WATER STERILE IRR 1000ML POUR (IV SOLUTION) ×4 IMPLANT

## 2020-10-22 NOTE — Anesthesia Procedure Notes (Signed)
Procedure Name: Intubation Date/Time: 10/22/2020 3:24 PM Performed by: Barrington Ellison, CRNA Pre-anesthesia Checklist: Patient identified, Emergency Drugs available, Suction available and Patient being monitored Patient Re-evaluated:Patient Re-evaluated prior to induction Oxygen Delivery Method: Circle System Utilized Preoxygenation: Pre-oxygenation with 100% oxygen Induction Type: IV induction Ventilation: Mask ventilation without difficulty and Oral airway inserted - appropriate to patient size Laryngoscope Size: Mac and 4 Grade View: Grade I Tube type: Oral Tube size: 8.0 mm Number of attempts: 1 Airway Equipment and Method: Stylet and Oral airway Placement Confirmation: ETT inserted through vocal cords under direct vision,  positive ETCO2 and breath sounds checked- equal and bilateral Secured at: 22 cm Tube secured with: Tape Dental Injury: Teeth and Oropharynx as per pre-operative assessment

## 2020-10-22 NOTE — Progress Notes (Signed)
PROGRESS NOTE    Colston C Groll  ZWC:[id number] DOB: May 18, 1932 DOA: 10/19/2020 PCP: Josetta Huddle, MD    Brief Narrative:  Jonathan Warner is an 87 year old male with past medical history significant for prostate cancer status post , CAD s/p CABG, chronic atrial fibrillation on Eliquis, tachybradycardia syndrome s/p PPM, chronic diastolic congestive heart failure, with recent diagnosis of metastatic L2 compression fracture who was brought to the hospital with severe pain and difficulty ambulating. Patient initially reported fell around Christmas time and has been having pain since. He is follows with orthopedics outpatient Dr. Ninfa Linden and was using a lumbar brace without much help. Patient denies any urinary problems or difficulty moving his bowels. No numbness or weakness of lower extremities. Does report decreased appetite over the past 2 months with being pretty much bedbound over the last few weeks due to his pain. Hospital service was consulted for further evaluation and management of intractable pain from lumbar spinous compression fraction with concerns of malignancy.   Assessment & Plan:   Active Problems:   Pathological fracture   Impaired ambulation   Metastatic cancer to spine [facility])   Palliative care by specialist   L2 pathological fracture Patient presenting to ED with intractable pain over the past several weeks to his L-spine. Initial fall in 07/2020. CT chest/abdomen/pelvis with contrast with osseous metastatic disease to the right 3rd rib and L2 vertebral body; small epidural component noted involving the L2 vertebral body which is largely eroded. --Medical oncology/palliative care, and IR following --Etiology thought to be new finding of right upper lobe mass --Continue back brace/abdominal binder as needed --Nuclear medicine bone scan: Pending --Dexamethasone 20 mg IV every 24 hours --Lidoderm patch --Dilaudid PCA --IR planning on L2 osteocool ablation with  kyphoplasty/vertbroplasty w/ Biopsy on 10/24/2020  Right upper lobe mass CT angiogram chest/abdomen/pelvis with multiple pulmonary masses with right upper lobe mass confluence with a rind of soft tissue encasing the right mainstem bronchus resulting in mild narrowing of the mainstem bronchus. --PCCM/medical oncology following, appreciate assistance --Plan for bronchoscopy this afternoon --N.p.o.   Essential hypertension --Amlodipine 5 mg p.o. daily  Chronic diastolic congestive heart failure, compensated --Furosemide 80 mg p.o. daily  Chronic atrial fibrillation Tachybradycardia syndrome s/p PPM --Holding home Eliquis, on heparin drip for pending bronchoscopy   DVT prophylaxis: Heparin drip   Code Status: Full Code Family Communication: No family present at bedside this morning  Disposition Plan:  Level of care: Med-Surg Status is: Inpatient  Remains inpatient appropriate because:Ongoing active pain requiring inpatient pain management, Ongoing diagnostic testing needed not appropriate for outpatient work up, Unsafe d/c plan, IV treatments appropriate due to intensity of illness or inability to take PO and Inpatient level of care appropriate due to severity of illness   Dispo:  Patient From: Home  Planned Disposition: Home with Health Care Svc  Medically stable for discharge: No       Consultants:   Oncology, Dr. Marin Olp  PCCM, Dr. Valeta Harms  Palliative care  Procedures:   None  Antimicrobials:   None   Subjective: Patient seen and examined bedside, resting comfortably.  Pain better controlled on Dilaudid PCA.  Pending bronchoscopy later this afternoon.  Patient agrees to proceed with kyphoplasty; IR consulted.  Patient without any other questions or concerns at this time. Denies headache, no visual changes, no chest pain, no palpitations, no shortness of breath, no abdominal pain, no fever/chills/night sweats, no nausea/vomiting/diarrhea. No acute events overnight per  nursing staff.  Objective: Vitals:  10/22/20 0400 10/22/20 0736 10/22/20 0800 10/22/20 1305  BP: 128/71 (!) 144/85  (!) 164/79  Pulse: 70 70  80  Resp: 16 18 18 18   Temp: 97.8 F (36.6 C) 97.8 F (36.6 C)  98 F (36.7 C)  TempSrc:      SpO2: 99% 98% 97% 92%  Weight:      Height:        Intake/Output Summary (Last 24 hours) at 10/22/2020 1347 Last data filed at 10/22/2020 0915 Gross per 24 hour  Intake 30 ml  Output 1300 ml  Net -1270 ml   Filed Weights   10/19/20 1549  Weight: 70.3 kg    Examination:  General exam: Appears calm and comfortable, mild/moderate pain with minimal movement Respiratory system: Clear to auscultation. Respiratory effort normal. Oxygenating well on room air Cardiovascular system: S1 & S2 heard, RRR. No JVD, murmurs, rubs, gallops or clicks. No pedal edema. Gastrointestinal system: Abdomen is nondistended, soft and nontender. No organomegaly or masses felt. Normal bowel sounds heard. Central nervous system: Alert and oriented. No focal neurological deficits. Extremities: Tenderness to palpation midline back about lumbar segments. Skin: No rashes, lesions or ulcers Psychiatry: Judgement and insight appear normal. Mood & affect appropriate.     Data Reviewed: I have personally reviewed following labs and imaging studies  CBC: Recent Labs  Lab 10/19/20 1228 10/20/20 0556 10/21/20 0059 10/22/20 0122  WBC 6.7 3.3* 6.8 6.7  HGB 12.0* 11.5* 11.6* 10.8*  HCT 38.2* 34.5* 36.3* 34.3*  MCV 99.0 96.4 95.5 97.7  PLT 184 185 210 973   Basic Metabolic Panel: Recent Labs  Lab 10/19/20 1228 10/20/20 0556 10/21/20 0059 10/22/20 0122  NA 139 138 137 136  K 3.0* 4.5 3.8 3.7  CL 97* 97* 98 97*  CO2 29 28 26 29   GLUCOSE 114* 164* 162* 181*  BUN 12 12 19  24*  CREATININE 1.15 1.09 1.23 1.17  CALCIUM 9.6 9.7 9.4 8.5*  MG 1.6*  --   --   --    GFR: Estimated Creatinine Clearance: 43.8 mL/min (by C-G formula based on SCr of 1.17 mg/dL). Liver  Function Tests: Recent Labs  Lab 10/19/20 1228  AST 23  ALT 11  ALKPHOS 95  BILITOT 1.9*  PROT 6.7  ALBUMIN 3.4*   No results for input(s): LIPASE, AMYLASE in the last 168 hours. No results for input(s): AMMONIA in the last 168 hours. Coagulation Profile: No results for input(s): INR, PROTIME in the last 168 hours. Cardiac Enzymes: No results for input(s): CKTOTAL, CKMB, CKMBINDEX, TROPONINI in the last 168 hours. BNP (last 3 results) Recent Labs    11/10/19 1038 12/08/19 1232 01/30/20 1336  PROBNP 2,698* 2,453* 2,125*   HbA1C: No results for input(s): HGBA1C in the last 72 hours. CBG: No results for input(s): GLUCAP in the last 168 hours. Lipid Profile: No results for input(s): CHOL, HDL, LDLCALC, TRIG, CHOLHDL, LDLDIRECT in the last 72 hours. Thyroid Function Tests: No results for input(s): TSH, T4TOTAL, FREET4, T3FREE, THYROIDAB in the last 72 hours. Anemia Panel: No results for input(s): VITAMINB12, FOLATE, FERRITIN, TIBC, IRON, RETICCTPCT in the last 72 hours. Sepsis Labs: No results for input(s): PROCALCITON, LATICACIDVEN in the last 168 hours.  Recent Results (from the past 240 hour(s))  SARS CORONAVIRUS 2 (TAT 6-24 HRS) Nasopharyngeal Nasopharyngeal Swab     Status: None   Collection Time: 10/19/20  1:47 PM   Specimen: Nasopharyngeal Swab  Result Value Ref Range Status   SARS Coronavirus 2 NEGATIVE NEGATIVE  Final    Comment: (NOTE) SARS-CoV-2 target nucleic acids are NOT DETECTED.  The SARS-CoV-2 RNA is generally detectable in upper and lower respiratory specimens during the acute phase of infection. Negative results do not preclude SARS-CoV-2 infection, do not rule out co-infections with other pathogens, and should not be used as the sole basis for treatment or other patient management decisions. Negative results must be combined with clinical observations, patient history, and epidemiological information. The expected result is Negative.  Fact Sheet  for Patients: SugarRoll.be  Fact Sheet for Healthcare Providers: https://www.woods-mathews.com/  This test is not yet approved or cleared by the [place] FDA and  has been authorized for detection and/or diagnosis of SARS-CoV-2 by FDA under an Emergency Use Authorization (EUA). This EUA will remain  in effect (meaning this test can be used) for the duration of the COVID-19 declaration under Se ction 564(b)(1) of the Act, 21 U.S.C. section 360bbb-3(b)(1), unless the authorization is terminated or revoked sooner.  Performed at [facility], [place] [address], [place], [place] [address]   MRSA PCR Screening     Status: None   Collection Time: 10/22/20  5:54 AM   Specimen: Nasal Mucosa; Nasopharyngeal  Result Value Ref Range Status   MRSA by PCR NEGATIVE NEGATIVE Final    Comment:        The GeneXpert MRSA Assay (FDA approved for NASAL specimens only), is one component of a comprehensive MRSA colonization surveillance program. It is not intended to diagnose MRSA infection nor to guide or monitor treatment for MRSA infections. Performed at [facility], [place] [address] [place], [place] [address]          Radiology Studies: No results found.      Scheduled Meds: . acetaminophen  500 mg Oral TID  . amLODipine  5 mg Oral Daily  . brimonidine  1 drop Both Eyes TID  . cholecalciferol  2,000 Units Oral Daily  . dexamethasone  20 mg Intravenous Q24H  . dorzolamide  1 drop Both Eyes TID  . feeding supplement  1 Container Oral TID BM  . furosemide  80 mg Oral Daily  . HYDROmorphone   Intravenous Q4H  . latanoprost  1 drop Both Eyes QHS  . lidocaine  1 patch Transdermal Q24H  . magnesium oxide  400 mg Oral BID  . multivitamin with minerals  1 tablet Oral Daily  . pantoprazole  40 mg Oral Daily  . polyethylene glycol  17 g Oral Daily  . potassium chloride SA  40 mEq Oral Daily  . senna  1 tablet Oral BID    Continuous Infusions:    LOS: 3 days    Time spent: 38 minutes spent on chart review, discussion with nursing staff, consultants, updating family and interview/physical exam; more than 50% of that time was spent in counseling and/or coordination of care.    Luc Shammas J [place] DO [facility] Available via Epic secure chat 7am-7pm After these hours, please refer to coverage provider listed on [email or web address] 10/22/2020, 1:47 PM

## 2020-10-22 NOTE — Progress Notes (Signed)
ANTICOAGULATION CONSULT NOTE - Follow Up Consult  Pharmacy Consult for IV Heparin Indication: atrial fibrillation  Allergies  Allergen Reactions  . Pilocarpine Other (See Comments)    Made him feel very funny feeling.  . Rosuvastatin Other (See Comments)    Knee and leg pain Knee and leg pain    Patient Measurements: Height: 5\' 8"  (172.7 cm) Weight: 70.3 kg (155 lb) IBW/kg (Calculated) : 68.4  Heparin dosing weight: 70 kg  Vital Signs: Temp: 97.5 F (36.4 C) (03/01 1642) Temp Source: Axillary (03/01 1642) BP: 158/65 (03/01 1712) Pulse Rate: 71 (03/01 1712)  Labs: Recent Labs    10/20/20 0556 10/20/20 0838 10/21/20 0059 10/21/20 1006 10/22/20 0122  HGB 11.5*  --  11.6*  --  10.8*  HCT 34.5*  --  36.3*  --  34.3*  PLT 185  --  210  --  191  APTT  --    < > 84* 85* 102*  HEPARINUNFRC  --    < > 1.12* 1.06* 0.99*  CREATININE 1.09  --  1.23  --  1.17   < > = values in this interval not displayed.    Estimated Creatinine Clearance: 43.8 mL/min (by C-G formula based on SCr of 1.17 mg/dL).   Medical History: Past Medical History:  Diagnosis Date  . Arthritis   . BPH (benign prostatic hyperplasia)   . CAD (coronary artery disease)    s/p CABG 1991  . Carotid artery occlusion   . Cataract    b/l surgery  . Chronic kidney disease (CKD), stage III (moderate) (HCC)   . Diverticulosis   . DJD (degenerative joint disease)   . DJD (degenerative joint disease)   . GERD (gastroesophageal reflux disease)   . Glaucoma   . Hiatal hernia   . History of adenomatous polyp of colon 06/07/13   Last colonoscopy 06/27/13 showed small adenoma, no further testing due to advanced age.  [name] History of radiation therapy 07/25/2012-09/20/2012   prostate 7800 cGy 40 sessions, seminal vesicles 5600 cGy 40 sessions  . History of shingles   . Hypercholesteremia   . Hyperlipidemia   . Hypertension   . Inguinal hernia   . Osteomyelitis of forearm, right, acute ([place])    drainage x 2  .  Persistent atrial fibrillation ([place])   . Prostate cancer ([place]) 04/27/12   Dr. Eulogio Ditch. Radiation therapy. Adenocarcinoma,gleason=3+4=7, 3+3=6,PSA=4.93  volume=51cc  . Pulmonary nodules    small rlung base nodule 4.31mm, scarring in lung bases  . 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  Pt was on apixaban PTA for Afib, with plan to hold apixaban and bridge with heparin for procedure (last dose of apixaban was at 1500 on 2/26).  APTT 102 is at high end of therapeutic and uptrending.   HL continues to be high, as expected from influence of apixaban. H/H, plt stable. Heparin was held at 10am this morning for bronchoscopy this afternoon. Will need to f/u if any bleeding during procedure and when to restart.  PM update - resume heparin at 2200 tonight per Dr. Juline Patch bronchoscopy procedure chart note. Per RN, patient has blood in sputum post-op but not significant amount. She will notify if worsens.  Goal of Therapy:  aPTT:  66-102 sec Heparin level:  0.3-0.7 units/ml Monitor platelets by anticoagulation protocol: Yes   Plan:  Resume heparin at 1100 units/hr at 2200 post-bronchoscopy per Dr. Valeta Harms Check 8hr aPTT from resumption Monitor daily heparin level and aPTT until correlating and CBC, s/sx bleeding   Arturo Morton, PharmD, BCPS Please check AMION for all [facility] contact numbers Clinical Pharmacist 10/22/2020 5:36 PM

## 2020-10-22 NOTE — Transfer of Care (Signed)
Immediate Anesthesia Transfer of Care Note  Patient: Joseantonio C Winkel  Procedure(s) Performed: VIDEO BRONCHOSCOPY WITH ENDOBRONCHIAL NAVIGATION (Right ) ENDOBRONCHIAL ULTRASOUND (N/A ) BRONCHIAL BIOPSIES BRONCHIAL BRUSHINGS BRONCHIAL NEEDLE ASPIRATION BIOPSIES  Patient Location: Endoscopy Unit  Anesthesia Type:General  Level of Consciousness: drowsy and patient cooperative  Airway & Oxygen Therapy: Patient Spontanous Breathing and Patient connected to nasal cannula oxygen  Post-op Assessment: Report given to RN  Post vital signs: Reviewed and stable  Last Vitals:  Vitals Value Taken Time  BP 123/42 10/22/20 1642  Temp    Pulse 76 10/22/20 1645  Resp 14 10/22/20 1645  SpO2 96 % 10/22/20 1645  Vitals shown include unvalidated device data.  Last Pain:  Vitals:   10/22/20 1200  TempSrc:   PainSc: 0-No pain      Patients Stated Pain Goal: 0 (91/63/84 6659)  Complications: No complications documented.

## 2020-10-22 NOTE — Progress Notes (Signed)
This chaplain responded to PMT consult for spiritual care.  The chaplain checked in with the RN-Denise before the visit. The RN updated the chaplain of the Pt. procedures today.  The Pt. is awake and conversational.  The chaplain understands the Pt. is at peaceful place with the management of his pain and the hope of having more information from today's medical procedures.  The chaplain witnessed the Pt. peacefully transition in conversation about aggressive care to the possibility of end of life care. The chaplain understands the Pt. affirms his faith and the importance of treasuring time with family in both scenarios. The Pt. 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   The Pt. Accepted the chaplain's intercessory prayer and F/U visit.

## 2020-10-22 NOTE — Progress Notes (Addendum)
Daily Progress Note   Patient Name: Jonathan Warner       Date: 10/22/2020 DOB: 1932/01/01  Age: 87 y.o. MRN#: [id number] Attending Physician: [place] Eric J, DO Primary Care Physician: Josetta Huddle, MD Admit Date: 10/19/2020  Reason for Consultation/Follow-up: Pain control  Subjective: Medical records reviewed. Assessed patient at the bedside. He is in good spirits and sitting on bedside commode. I was able to assist NT with transfer back to bed. Patient demonstrated safe body mechanics and denies pain with slow and deliberate movements.  Jonathan Warner's pain has improved well since PCA set up yesterday. He is feeling slight overwhelmed by today's upcoming procedures but also feels ready to "get it over with." He wishes that he could have something to eat and informs me that his bronchoscopy is not until 3pm. His family will be visiting just before that time. He shares that he does not want to be a burden to his son, although he finds great strength from his support and encouragement. Jonathan Warner is hopeful that he will find some answers and clarity with today's results.   Provided education and clarification regarding plan for PCA. We will monitor his usage over 48 hours and calculate an effective long-acting oral dose. Questions and concerns addressed. Patient and family have PMT contact information.   Length of Stay: 3  Physical Exam Vitals and nursing note reviewed.  Cardiovascular:     Rate and Rhythm: Normal rate and regular rhythm.  Pulmonary:     Effort: Pulmonary effort is normal.  Skin:    General: Skin is warm and dry.  Neurological:     Mental Status: He is alert.  Psychiatric:        Mood and Affect: Mood normal.        Behavior: Behavior normal.             Vital Signs: BP (!)  144/85 (BP Location: Right Arm)   Pulse 70   Temp 97.8 F (36.6 C)   Resp 18   Ht 5\' 8"  (1.727 m)   Wt 70.3 kg   SpO2 98%   BMI 23.57 kg/m  SpO2: SpO2: 98 % O2 Device: O2 Device: Nasal Cannula O2 Flow Rate: O2 Flow Rate (L/min): 2 L/min  Intake/output summary:   Intake/Output Summary (Last 24 hours) at 10/22/2020 0918 Last  data filed at 10/22/2020 0400 Gross per 24 hour  Intake 30 ml  Output 600 ml  Net -570 ml   LBM: Last BM Date: 10/19/20 Baseline Weight: Weight: 70.3 kg Most recent weight: Weight: 70.3 kg       Palliative Assessment/Data: 40%     Patient Active Problem List   Diagnosis Date Noted  . Metastatic cancer to spine ([place])   . Palliative care by specialist   . Pathological fracture 10/19/2020  . Impaired ambulation 10/19/2020  . Pathologic compression fracture of vertebra (HCC)   . Unilateral primary osteoarthritis, left knee 04/17/2020  . Unilateral primary osteoarthritis, right knee 04/17/2020  . Coronary artery disease involving native coronary artery of native heart with angina pectoris ([place]) 11/06/2016  . Hypertensive heart and chronic kidney disease with heart failure and stage 1 through stage 4 chronic kidney disease, or chronic kidney disease ([place]) 11/06/2016  . Chronic anticoagulation 12/01/2013  . History of radiation therapy   . Prostate cancer ([place]) 05/31/2012  . Bradycardia 05/23/2012  . Atrial fibrillation ([place]) 05/23/2012    Palliative Care Assessment & Plan   Patient Profile: Per intake H&P -->Jonathan Warner a 87 y.o.malewith medical history significant ofremote history of prostate CA status post surgical removal and radiation,CAD s/p CABG,chronic A. fib on Eliquis, tachybradycardia syndrome status post PPM, chronic diastolic CHF, recently diagnosed L2 metastatic CA and pathologic fracture presented with worsening of back pain and ambulation dysfunction.  Assessment: Pathological fracture L2 Metastatic cancer to spine Impaired  ambulation Lung masses  Recommendations/Plan:  Continue monitoring pain control with Dilaudid 1mg /mL PCA  Patient has bone scan and bronchoscopy scheduled today  Ongoing discussions regarding goals of care, advance care planning, anticipatory care needs.  Continue to support holistically and appreciate chaplain support  Goals of Care and Additional Recommendations:  Limitations on Scope of Treatment: Full Scope Treatment  Code Status: FULL code   Code Status Orders  (From admission, onward)         Start     Ordered   10/19/20 1425  Full code  Continuous        10/19/20 1425        Code Status History    Date Active Date Inactive Code Status Order ID Comments User Context   05/24/2012 1619 05/24/2012 2207 Full Code [id number]  Jerilee Field, RN Inpatient   Advance Care Planning Activity    Advance Directive Documentation   Flowsheet Row Most Recent Value  Type of Advance Directive Healthcare Power of Attorney  Pre-existing out of facility DNR order (yellow form or pink MOST form) --  "MOST" Form in Place? --      Prognosis:   Unfortunately poor long-term prognosis given incurable stage IV cancer  Discharge Planning:  Home with Palliative Services  Care plan was discussed with patient, [name] NP, and Dorian Pod Thompson/Chaplain.  Thank you for allowing the Palliative Medicine Team to assist in the care of this patient.  Total time: 35 minutes   Greater than 50% of this time was spent counseling and coordinating care related to the above assessment and plan.  Addaline Peplinski Johnnette Litter, PA-C  Please contact Palliative Medicine Team phone at [phone] for questions and concerns.

## 2020-10-22 NOTE — Anesthesia Preprocedure Evaluation (Addendum)
Anesthesia Evaluation  Patient identified by MRN, date of birth, ID band Patient awake    Reviewed: Allergy & Precautions, NPO status , Patient's Chart, lab work & pertinent test results  Airway Mallampati: II  TM Distance: >3 FB Neck ROM: Full    Dental no notable dental hx.    Pulmonary former smoker,  Lung mass   Pulmonary exam normal        Cardiovascular hypertension, Pt. on medications + CAD and + CABG (1991)  + dysrhythmias Atrial Fibrillation + pacemaker (2004)  Rhythm:Irregular Rate:Normal     Neuro/Psych negative neurological ROS  negative psych ROS   GI/Hepatic Neg liver ROS, hiatal hernia, GERD  Medicated,  Endo/Other  negative endocrine ROS  Renal/GU CRFRenal disease   BPH    Musculoskeletal  (+) Arthritis , Osteoarthritis,    Abdominal (+)  Abdomen: soft. Bowel sounds: normal.  Peds  Hematology negative hematology ROS (+)   Anesthesia Other Findings   Reproductive/Obstetrics                            Anesthesia Physical Anesthesia Plan  ASA: III  Anesthesia Plan: General   Post-op Pain Management:    Induction: Intravenous  PONV Risk Score and Plan: 2 and Ondansetron, Dexamethasone and Treatment may vary due to age or medical condition  Airway Management Planned: Mask and Oral ETT  Additional Equipment: None  Intra-op Plan:   Post-operative Plan: Extubation in OR  Informed Consent: I have reviewed the patients History and Physical, chart, labs and discussed the procedure including the risks, benefits and alternatives for the proposed anesthesia with the patient or authorized representative who has indicated his/her understanding and acceptance.     Dental advisory given  Plan Discussed with: CRNA  Anesthesia Plan Comments: (Stress 11/21: Myovue scan shows large fixed defect of moderate severity in the anterior (base/mid), anteroseptal (base),  anterolateral (base, mid) and inferolateral(base/mid) consistent with with scar, cannot exclude some soft tissue attenuation (diaphragm); no ischemia. LVEF on gating calculated at 48%. Consider echo to confirm LV size/wall motion/overall function. Intermediate risk study   Lab Results      Component                Value               Date                      WBC                      6.7                 10/22/2020                HGB                      10.8 (L)            10/22/2020                HCT                      34.3 (L)            10/22/2020                MCV  97.7                10/22/2020                PLT                      191                 10/22/2020           Lab Results      Component                Value               Date                      NA                       136                 10/22/2020                K                        3.7                 10/22/2020                CO2                      29                  10/22/2020                GLUCOSE                  181 (H)             10/22/2020                BUN                      24 (H)              10/22/2020                CREATININE               1.17                10/22/2020                CALCIUM                  8.5 (L)             10/22/2020                GFRNONAA                 >60                 10/22/2020                GFRAA                    62                  08/09/2020          )  Anesthesia Quick Evaluation

## 2020-10-22 NOTE — Progress Notes (Signed)
IR consulted by Dr. [place] for management of L2 pathologic compression fracture.  Case/images have been reviewed by Dr. Estanislado Pandy who recommends image-guided L2 osteocool ablation with kyphoplasty/vertebroplasty and biopsy under general anesthesia. Insurance authorization approved. Plan for procedure tentatively in IR with Dr. Estanislado Pandy and anesthesia on Thursday 10/24/2020 pending IR scheduling. Patient currently in [place] with plans for bronchoscopy following this- will plan to see patient for consent tomorrow. Formal consult/orders to follow.  Please call IR with questions/concerns.   Bea Graff Sharyn Brilliant, PA-C 10/22/2020, 1:35 PM

## 2020-10-22 NOTE — Progress Notes (Signed)
OT Cancellation Note  Patient Details Name: Adriene MUHSIN DORIS MRN: [id number] DOB: Jul 04, 1932   Cancelled Treatment:    Reason Eval/Treat Not Completed: Fatigue/lethargy limiting ability to participate;Pain limiting ability to participate. P states that he is comfortable right now and doesn't want to move and start hurting. Pt scheduled for bronchoscopy this afternoon. OT will follow up next available time  Britt Bottom 10/22/2020, 12:17 PM

## 2020-10-22 NOTE — Plan of Care (Signed)

## 2020-10-22 NOTE — Op Note (Signed)
Video Bronchoscopy with Electromagnetic Navigation Procedure Note  Date of Operation: 10/22/2020  Pre-op Diagnosis: Right upper lobe and right lower lobe lung mass  Post-op Diagnosis: Right upper lobe and right lower lobe lung mass  Surgeon: Garner Nash, DO   Assistants: None   Anesthesia: General endotracheal anesthesia  Operation: Flexible video fiberoptic bronchoscopy with electromagnetic navigation and biopsies.  Estimated Blood Loss: Minimal  Complications: None   Indications and History: Jonathan Warner is a 87 y.o. male with right upper lobe and right lower lobe lung mass.  The risks, benefits, complications, treatment options and expected outcomes were discussed with the patient.  The possibilities of pneumothorax, pneumonia, reaction to medication, pulmonary aspiration, perforation of a viscus, bleeding, failure to diagnose a condition and creating a complication requiring transfusion or operation were discussed with the patient who freely signed the consent.    Description of Procedure: The patient was seen in the Preoperative Area, was examined and was deemed appropriate to proceed.  The patient was taken to [facility] endoscopy room 2, identified as Jonathan Warner and the procedure verified as Flexible Video Fiberoptic Bronchoscopy.  A Time Out was held and the above information confirmed.   Prior to the date of the procedure a high-resolution CT scan of the chest was performed. Utilizing [place] a virtual tracheobronchial tree was generated to allow the creation of distinct navigation pathways to the patient's parenchymal abnormalities. After being taken to the operating room general anesthesia was initiated and the patient  was orally intubated. The video fiberoptic bronchoscope was introduced via the endotracheal tube and a general inspection was performed which showed abnormal appearing mucosa within the bronchus intermedius along the medial wall concerning for tumor  infiltration, extrinsic compression of the right upper lobe posterior segment, no visible tumor within the right upper lobe, normal left lung anatomy.   Prior to the start of the navigation we used cytology brush to complete brushings of the bronchus intermedius irregular mucosa as documented above.  We also used endobronchial forceps to take small bites from this area but due to submucosal bleeding and oozing we stopped to focus attention on the upper lobe mass.  The extendable working channel and locator guide were introduced into the bronchoscope.  Target #1 right upper lobe The distinct navigation pathways prepared prior to this procedure were then utilized to navigate to within 0.5 cm of patient's lesion(s) identified on CT scan. The extendable working channel was secured into place and the locator guide was withdrawn. Under fluoroscopic guidance transbronchial needle brushings, transbronchial Wang needle biopsies, and transbronchial forceps biopsies were performed to be sent for cytology and pathology. A bronchioalveolar lavage was performed in the right upper lobe and sent for cytology.  Additional transbronchial biopsies were collected for cellblock analysis.  Target #2 right lower lobe: The distinct navigation pathways prepared prior to this procedure were then utilized to navigate to within 0.8 cm of patient's lesion(s) identified on CT scan. The extendable working channel was secured into place and the locator guide was withdrawn. Under fluoroscopic guidance transbronchial needle brushings, transbronchial Wang needle biopsies, and transbronchial forceps biopsies were performed to be sent for cytology and pathology. A bronchioalveolar lavage was performed in the right lower lobe and sent for cytology   Therapeutic bronchoscope was inserted into the airway and aspiration of the bilateral mainstem's was necessary for move or any remaining blood clots and debris.  At the end of term nation of the  procedure there was no evidence  of active bleeding and all distal subsegments were patent. At the end of the procedure a general airway inspection was performed and there was no evidence of active bleeding. The bronchoscope was removed.  The patient tolerated the procedure well. There was no significant blood loss and there were no obvious complications. A post-procedural chest x-ray is pending.  Target #1 samples: 1. Transbronchial needle brushings from RUL 2. Transbronchial Wang needle biopsies from RUL 3. Transbronchial forceps biopsies from RUL 4. Bronchoalveolar lavage from RUL  Target #1 samples: 1. Transbronchial needle brushings from RLL 2. Transbronchial Wang needle biopsies from RLL 3. Transbronchial forceps biopsies from RLL 4. Bronchoalveolar lavage from RLL  Right Bronchus intermedius: 1. Endobronchial biopsies from bronchus intermedius  2. Endobronchial brushings   Plans:  The patient will be discharged from the PACU to home when recovered from anesthesia and after chest x-ray is reviewed. We will review the cytology, pathology and microbiology results with the patient when they become available. Outpatient followup will be with Octavio Graves Annalynne Ibanez, DO   We can restart Heparin at 10:00PM tonight.     [facility] 10/22/2020 4:31 PM

## 2020-10-22 NOTE — Telephone Encounter (Signed)
Spoke with pt son, advised 3/3 appt is remote check, if pt not home when check is needed it can be transmitted when he returns home.

## 2020-10-22 NOTE — Progress Notes (Signed)
Pt left the floor for  [facility].  Family in the room waiting for the pt/

## 2020-10-22 NOTE — Progress Notes (Signed)
Pt returned to the unit.  Very sleepy.  Family in with the pt.  PCA pump resumed

## 2020-10-22 NOTE — Progress Notes (Addendum)
ANTICOAGULATION CONSULT NOTE - Follow Up Consult  Pharmacy Consult for IV Heparin Indication: atrial fibrillation  Allergies  Allergen Reactions  . Pilocarpine Other (See Comments)    Made him feel very funny feeling.  . Rosuvastatin Other (See Comments)    Knee and leg pain Knee and leg pain    Patient Measurements: Height: 5\' 8"  (172.7 cm) Weight: 70.3 kg (155 lb) IBW/kg (Calculated) : 68.4  Heparin dosing weight: 70 kg  Vital Signs: Temp: 97.8 F (36.6 C) (03/01 0736) Temp Source: Oral (03/01 0000) BP: 144/85 (03/01 0736) Pulse Rate: 70 (03/01 0736)  Labs: Recent Labs    10/20/20 0556 10/20/20 0838 10/21/20 0059 10/21/20 1006 10/22/20 0122  HGB 11.5*  --  11.6*  --  10.8*  HCT 34.5*  --  36.3*  --  34.3*  PLT 185  --  210  --  191  APTT  --    < > 84* 85* 102*  HEPARINUNFRC  --    < > 1.12* 1.06* 0.99*  CREATININE 1.09  --  1.23  --  1.17   < > = values in this interval not displayed.    Estimated Creatinine Clearance: 43.8 mL/min (by C-G formula based on SCr of 1.17 mg/dL).   Medical History: Past Medical History:  Diagnosis Date  . Arthritis   . BPH (benign prostatic hyperplasia)   . CAD (coronary artery disease)    s/p CABG 1991  . Carotid artery occlusion   . Cataract    b/l surgery  . Chronic kidney disease (CKD), stage III (moderate) (HCC)   . Diverticulosis   . DJD (degenerative joint disease)   . DJD (degenerative joint disease)   . GERD (gastroesophageal reflux disease)   . Glaucoma   . Hiatal hernia   . History of adenomatous polyp of colon 06/07/13   Last colonoscopy 06/27/13 showed small adenoma, no further testing due to advanced age.  [name] History of radiation therapy 07/25/2012-09/20/2012   prostate 7800 cGy 40 sessions, seminal vesicles 5600 cGy 40 sessions  . History of shingles   . Hypercholesteremia   . Hyperlipidemia   . Hypertension   . Inguinal hernia   . Osteomyelitis of forearm, right, acute ([place])    drainage x 2  .  Persistent atrial fibrillation ([place])   . Prostate cancer ([place]) 04/27/12   Dr. Eulogio Ditch. Radiation therapy. Adenocarcinoma,gleason=3+4=7, 3+3=6,PSA=4.93  volume=51cc  . Pulmonary nodules    small rlung base nodule 4.60mm, scarring in lung bases  . 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  Pt was on apixaban PTA for Afib, with plan to hold apixaban and bridge with heparin for procedure (last dose of apixaban was at 1500 on 2/26).  APTT 102 is at high end of therapeutic and uptrending.   HL continues to be high. H/H, plt stable, no reported bleeding. Heparin was held at 10am this morning for bronchoscopy this afternoon. Will need to f/u if any bleeding during procedure and when to restart.  Goal of Therapy:  aPTT:  66-102 sec Heparin level:  0.3-0.7 units/ml Monitor platelets by anticoagulation protocol: Yes   Plan:  Hold heparin until after bronchoscopy; F/u if any bleeding and when able to restart, would restart at 1100 units/hr  F/u aPTT until correlates with heparin level  Monitor daily aPTT, HL, CBC/plt Monitor for signs/symptoms of bleeding  F/u restart apix when invasive procedures done  Benetta Spar, PharmD, BCPS, [facility] Clinical Pharmacist  Please check AMION for all [place] phone numbers After 10:00 PM, call [place]

## 2020-10-22 NOTE — Interval H&P Note (Signed)
History and Physical Interval Note:  10/22/2020 3:00 PM  Jonathan Warner  has presented today for surgery, with the diagnosis of lung mass.  The various methods of treatment have been discussed with the patient and family. After consideration of risks, benefits and other options for treatment, the patient has consented to  Procedure(s): [place] (Right) ENDOBRONCHIAL ULTRASOUND (N/A) as a surgical intervention.  The patient's history has been reviewed, patient examined, no change in status, stable for surgery.  I have reviewed the patient's chart and labs.  Questions were answered to the patient's satisfaction.     [place]

## 2020-10-22 NOTE — Progress Notes (Signed)
Jonathan Warner is supposed  to have the bronchoscopy today.  Also seems to think that he is going to get the vertebroplasty today.  I will see any notes from interventional radiology regarding this.  He is on a PCA pump right now.  Palliative care is trying to help manage his pain.  I think orthopedic surgery has prescribed a back brace.  Hopefully this might help stabilize the back.  I spoke to Dr. Sondra Come of Radiation Oncology yesterday.  They will see Jonathan Warner today.  He still has a tough time getting up and down because of the L2 fracture.  He told that he has a undescended testicle.  I am not sure exactly which one this is.  He does not know which one it is.  We may have to talk with his urologist-Dr. Lubertha South see what he has to say about this.  This certainly could increase the risk of a germ cell tumor being found.  He is eating okay.  There is no nausea or vomiting.  He does feel bloated.  I do not know if this is from lack of bowel movements.  I ordered a bone scan to see if there is any other fractures or any other bony lesions from this malignancy.  His labs show white cell count 6.7.  Hemoglobin 10.8.  Platelet count 191,000.  His BUN is 24 creatinine 1.17.  His blood sugar is 181.  Again, we really have to see if interventional radiology can do a vertebroplasty on him.  We really need something to help stabilize this fracture.  Lattie Haw, MD  John 15:13

## 2020-10-22 NOTE — Progress Notes (Incomplete)
Radiation Oncology         [phone] ________________________________  Initial Inpatient Consultation  Name: Jonathan Warner MRN: [id number]  Date: 10/22/2020  DOB: [birth year]  HE:RDEYC, Jonathan [place], MD  Volanda Napoleon, MD   REFERRING PHYSICIAN: Volanda Napoleon, MD  DIAGNOSIS: There were no encounter diagnoses.  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 male who is seen as an inpatient as a courtesy of Dr. Marin Olp for an opinion concerning radiation therapy as part of management for his suspected metastatic lung cancer. The patient presented with worsening back pain and underwent a CT scan of lumbar spine on 10/17/2020 that showed a pathologic fracture of the L2 vertebral body due to metastatic disease or possibly myeloma. There was extraosseous tumor extension on the right at L2. There was no compromise of the spinal canal. There was a lytic appearance suggesting metastatic carcinoma, however, metastatic prostate cancer was possible, given his history.  The patient presented to the ED and was admitted to the hospital on 10/17/2020. While admitted, he underwent a chest CT scan on 10/20/2020 that showed multiple pulmonary masses, suspicious for synchronous right upper lobe and right lower lobe bronchogenic neoplasms. Right upper lobe mass appeared confluent with a rind of soft tissue, which encased the right mainstem bronchus, approaching within 6 mm of the carina and results in mild narrowing of the mainstem bronchus as well as the central lobar bronchi of the right lung. There was oseous metastatic disease to the right third rib laterally as well as the L2 vertebral body. Small epidural component was noted involving the L2 vertebral body, which was largely eroded. CT scan of head on that same day was stable.  The patient has been followed closely by Dr. Marin Olp while admitted. He is supposed to undergo a  bronchoscopy today.  PREVIOUS RADIATION THERAPY: {EXAM; YES/NO:19492::"No"}  PAST MEDICAL HISTORY:  Past Medical History:  Diagnosis Date  . Arthritis   . BPH (benign prostatic hyperplasia)   . CAD (coronary artery disease)    s/p CABG 1991  . Carotid artery occlusion   . Cataract    b/l surgery  . Chronic kidney disease (CKD), stage III (moderate) (HCC)   . Diverticulosis   . DJD (degenerative joint disease)   . DJD (degenerative joint disease)   . GERD (gastroesophageal reflux disease)   . Glaucoma   . Hiatal hernia   . History of adenomatous polyp of colon 06/07/13   Last colonoscopy 06/27/13 showed small adenoma, no further testing due to advanced age.  [name] History of radiation therapy 07/25/2012-09/20/2012   prostate 7800 cGy 40 sessions, seminal vesicles 5600 cGy 40 sessions  . History of shingles   . Hypercholesteremia   . Hyperlipidemia   . Hypertension   . Inguinal hernia   . Osteomyelitis of forearm, right, acute ([place])    drainage x 2  . Persistent atrial fibrillation ([place])   . Prostate cancer ([place]) 04/27/12   Dr. Eulogio Ditch. Radiation therapy. Adenocarcinoma,gleason=3+4=7, 3+3=6,PSA=4.93  volume=51cc  . Pulmonary nodules    small rlung base nodule 4.58mm, scarring in lung bases  . 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 CARDIAC CATHETERIZATION  06/04/09   left w/noted patent bypass grafts  . CATARACT EXTRACTION W/  INTRAOCULAR LENS IMPLANT  2011  . COLONOSCOPY  12/21/2007   hx polyps, diverticulosis  . COLONOSCOPY WITH PROPOFOL N/A 06/27/2013   Procedure: COLONOSCOPY WITH PROPOFOL;  Surgeon: Garlan Fair, MD;  Location: [facility];  Service: Endoscopy;  Laterality: N/A;  . CORONARY ARTERY BYPASS GRAFT  1991   x5; prior MI  . PACEMAKER GENERATOR CHANGE  05/24/12  . PACEMAKER INSERTION  05/14/03   MDT EnPulse  implanted by Dr Leonia Reeves ddd  . PERMANENT PACEMAKER GENERATOR CHANGE N/A 05/24/2012   Procedure: PERMANENT PACEMAKER GENERATOR CHANGE;  Surgeon: Thompson Grayer, MD;  Location: [facility] CATH [facility];  Service: Cardiovascular;  Laterality: N/A;  . PROSTATE BIOPSY  04/27/12   Adenocarcinoma,gleason=3+3=6, 3+4,& 4+3,PSA=4.93,volume=51cc  . TUMOR REMOVAL     skin tumors removed    FAMILY HISTORY:  Family History  Problem Relation Age of Onset  . Hypertension Mother   . Congestive Heart Failure Mother   . CAD Mother   . Alzheimer's disease Father   . CAD Brother   . Pancreatic cancer Brother   . CVA Brother   . Hypertension Sister        Has 6 sisters, many suffer from HTN  . Heart attack Brother   . Stroke Brother     SOCIAL HISTORY:  Social History   Tobacco Use  . Smoking status: Former Smoker    Packs/day: 0.50    Years: 10.00    Pack years: 5.00    Types: Cigarettes    Quit date: 08/24/1981    Years since quitting: 39.1  . Smokeless tobacco: Never Used  Vaping Use  . Vaping Use: Never used  Substance Use Topics  . Alcohol use: No  . Drug use: No    ALLERGIES:  Allergies  Allergen Reactions  . Pilocarpine Other (See Comments)    Made him feel very funny feeling.  . Rosuvastatin Other (See Comments)    Knee and leg pain Knee and leg pain    MEDICATIONS:  No current facility-administered medications for this encounter.   No current outpatient medications on file.   Facility-Administered Medications Ordered in Other Encounters  Medication Dose Route Frequency Provider Last Rate Last Admin  . [MAR Hold] acetaminophen (TYLENOL) tablet 500 mg  500 mg Oral TID Rosezella Rumpf, NP   500 mg at 10/21/20 2100  . [MAR Hold] amLODipine (NORVASC) tablet 5 mg  5 mg Oral Daily Wynetta Fines T, MD   5 mg at 10/21/20 0846  . [MAR Hold] brimonidine (ALPHAGAN) 0.2 % ophthalmic solution 1 drop  1 drop Both Eyes TID Lequita Halt, MD   1 drop at 10/21/20 2101  . [MAR Hold] cholecalciferol  (VITAMIN D3) tablet 2,000 Units  2,000 Units Oral Daily Lequita Halt, MD   2,000 Units at 10/21/20 [phone]  . [MAR Hold] dexamethasone (DECADRON) injection 20 mg  20 mg Intravenous Q24H Volanda Napoleon, MD   20 mg at 10/21/20 1618  . [MAR Hold] dorzolamide (TRUSOPT) 2 % ophthalmic solution 1 drop  1 drop Both Eyes TID Lequita Halt, MD   1 drop at 10/21/20 2101  . [MAR Hold] feeding supplement (BOOST / RESOURCE BREEZE) liquid 1 Container  1 Container Oral TID BM [place] Eric J, DO   1 Container at 10/21/20 2108  . [MAR Hold] furosemide (LASIX) tablet 80 mg  80 mg Oral Daily Wynetta Fines T, MD   80 mg at 10/21/20 0846  . [MAR Hold] HYDROmorphone (DILAUDID) 1 mg/mL PCA injection  Intravenous Q4H Rosezella Rumpf, NP   30 mg at 10/21/20 1307  . [MAR Hold] latanoprost (XALATAN) 0.005 % ophthalmic solution 1 drop  1 drop Both Eyes QHS Lequita Halt, MD   1 drop at 10/21/20 2101  . [MAR Hold] lidocaine (LIDODERM) 5 % 1 patch  1 patch Transdermal Q24H Rosezella Rumpf, NP   1 patch at 10/21/20 0848  . [MAR Hold] magnesium oxide (MAG-OX) tablet 400 mg  400 mg Oral BID Barb Merino, MD   400 mg at 10/21/20 2100  . [MAR Hold] multivitamin with minerals tablet 1 tablet  1 tablet Oral Daily [place] Eric J, DO      . [MAR Hold] naloxone [facility]) injection 0.4 mg  0.4 mg Intravenous PRN Rosezella Rumpf, NP       And  . [MAR Hold] sodium chloride flush (NS) 0.9 % injection 9 mL  9 mL Intravenous PRN Rosezella Rumpf, NP      . Doug Sou Hold] ondansetron [facility]) injection 4 mg  4 mg Intravenous Q6H PRN Wynetta Fines T, MD   4 mg at 10/19/20 1738  . [MAR Hold] pantoprazole (PROTONIX) EC tablet 40 mg  40 mg Oral Daily Wynetta Fines T, MD   40 mg at 10/21/20 0846  . [MAR Hold] polyethylene glycol (MIRALAX / GLYCOLAX) packet 17 g  17 g Oral Daily Wynetta Fines T, MD   17 g at 10/21/20 0848  . [MAR Hold] potassium chloride SA (KLOR-CON) CR tablet 40 mEq  40 mEq Oral Daily Wynetta Fines T, MD   40 mEq at 10/21/20  0846  . [MAR Hold] senna (SENOKOT) tablet 8.6 mg  1 tablet Oral BID Rosezella Rumpf, NP   8.6 mg at 10/21/20 2100    REVIEW OF SYSTEMS:  A 10+ POINT REVIEW OF SYSTEMS WAS OBTAINED including neurology, dermatology, psychiatry, cardiac, respiratory, lymph, extremities, GI, GU, musculoskeletal, constitutional, reproductive, HEENT. ***   PHYSICAL EXAM:  vitals were not taken for this visit.   General: Alert and oriented, in no acute distress HEENT: Head is normocephalic. Extraocular movements are intact. Oropharynx is clear. Neck: Neck is supple, no palpable cervical or supraclavicular lymphadenopathy. Heart: Regular in rate and rhythm with no murmurs, rubs, or gallops. Chest: Clear to auscultation bilaterally, with no rhonchi, wheezes, or rales. Abdomen: Soft, nontender, nondistended, with no rigidity or guarding. Extremities: No cyanosis or edema. Lymphatics: see Neck Exam Skin: No concerning lesions. Musculoskeletal: symmetric strength and muscle tone throughout. Neurologic: Cranial nerves II through XII are grossly intact. No obvious focalities. Speech is fluent. Coordination is intact. Psychiatric: Judgment and insight are intact. Affect is appropriate. ***  ECOG = ***  0 - Asymptomatic (Fully active, able to carry on all predisease activities without restriction)  1 - Symptomatic but completely ambulatory (Restricted in physically strenuous activity but ambulatory and able to carry out work of a light or sedentary nature. For example, light housework, office work)  2 - Symptomatic, <50% in bed during the day (Ambulatory and capable of all self care but unable to carry out any work activities. Up and about more than 50% of waking hours)  3 - Symptomatic, >50% in bed, but not bedbound (Capable of only limited self-care, confined to bed or chair 50% or more of waking hours)  4 - Bedbound (Completely disabled. Cannot carry on any self-care. Totally confined to bed or chair)  5 -  Death   Eustace Pen MM, Creech RH, Tormey DC, et al. [phone]). "Toxicity and  response criteria of the [facility]". [place] Oncol. 5 (6): 649-55  LABORATORY DATA:  Lab Results  Component Value Date   WBC 6.7 10/22/2020   HGB 10.8 (L) 10/22/2020   HCT 34.3 (L) 10/22/2020   MCV 97.7 10/22/2020   PLT 191 10/22/2020   NEUTROABS 5.9 05/16/2020   Lab Results  Component Value Date   NA 136 10/22/2020   K 3.7 10/22/2020   CL 97 (L) 10/22/2020   CO2 29 10/22/2020   GLUCOSE 181 (H) 10/22/2020   CREATININE 1.17 10/22/2020   CALCIUM 8.5 (L) 10/22/2020      RADIOGRAPHY: CT HEAD W & WO CONTRAST  Result Date: 10/20/2020 CLINICAL DATA:  87 year old male with evidence of metastatic disease on recent CT Chest, Abdomen, and Pelvis. Unable to have MRI. EXAM: CT HEAD WITHOUT AND WITH CONTRAST TECHNIQUE: Contiguous axial images were obtained from the base of the skull through the vertex without and with intravenous contrast CONTRAST:  26mL OMNIPAQUE IOHEXOL 300 MG/ML  SOLN COMPARISON:  Plain head CT 05/16/2020. FINDINGS: Brain: Stable cerebral volume. No midline shift, ventriculomegaly, mass effect, evidence of mass lesion, intracranial hemorrhage or evidence of cortically based acute infarction. Stable gray-white matter differentiation throughout the brain. No evidence of vasogenic edema. Small chronic lacunar infarcts suspected in the left caudate (series 5, image 30) and stable. No abnormal enhancement identified. Vascular: Mild Calcified atherosclerosis at the skull base. The major intracranial vascular structures are enhancing as expected. Skull: Stable since September. Small circumscribed sclerotic area in the left parietal bone (series 4, image 68) is probably a benign bone island. No other suspicious osseous lesion. Anterior C1-C2 degeneration incidentally noted. Sinuses/Orbits: Visualized paranasal sinuses and mastoids are stable and well pneumatized. Other: No acute orbit or scalp  soft tissue finding. IMPRESSION: No metastatic disease or acute intracranial abnormality identified. Stable Head CT since September. Electronically Signed   By: Genevie Ann M.D.   On: 10/20/2020 10:32   CT LUMBAR SPINE WO CONTRAST  Result Date: 10/17/2020 CLINICAL DATA:  Lumbar compression fracture. Fall December 2021. History of prostate cancer EXAM: CT LUMBAR SPINE WITHOUT CONTRAST TECHNIQUE: Multidetector CT imaging of the lumbar spine was performed without intravenous contrast administration. Multiplanar CT image reconstructions were also generated. COMPARISON:  Lumbar radiographs 10/16/2020, 10/02/2020 FINDINGS: Segmentation: Normal Alignment: Mild retrolisthesis L1-2 and L2-3. Mild anterolisthesis L3-4. Vertebrae: Pathologic fracture of L2. No bone lesion on the prior 2012 CT. There is lytic destruction of much of the L2 vertebral body, right greater than left. Tumor extends to the anterior pedicle on the right of L2. There is extraosseous extension of tumor into the right paraspinous soft tissues and right psoas muscle. No other fracture or bone lesion identified in the lumbar spine. Paraspinal and other soft tissues: Paraspinous tumor to the right of the L2 vertebral body compatible with tumor extension. This mass invades the psoas muscle measures approximately 2.5 x 3.3 cm. No retroperitoneal adenopathy Atherosclerotic aorta without aneurysm. Hiatal hernia Disc levels: T12-L1: Mild disc degeneration.  Negative for stenosis L1-2: Mild disc degeneration.  Negative for spinal stenosis L2-3: Mild disc degeneration.  Negative for stenosis. L3-4: Disc degeneration with disc bulging and spurring. Bilateral facet hypertrophy. Mild spinal stenosis L4-5: Disc degeneration with diffuse endplate spurring and bilateral facet hypertrophy. Moderate right subarticular stenosis due to spurring. L5-S1: Disc degeneration and diffuse endplate spurring. Bilateral facet degeneration. Mild to moderate foraminal stenosis  bilaterally due to spurring. IMPRESSION: Pathologic fracture L2 vertebral body due to metastatic disease  or possibly myeloma. There is extraosseous tumor extension on the right at L2. No compromise of the spinal canal. This has a lytic appearance suggesting metastatic carcinoma however metastatic prostate cancer is possible. Correlate with PSA. Multilevel degenerative change throughout the lumbar spine. Atherosclerotic aorta Electronically Signed   By: Franchot Gallo M.D.   On: 10/17/2020 08:43   CT CHEST ABDOMEN PELVIS W CONTRAST  Result Date: 10/20/2020 CLINICAL DATA:  Low back pain, L2 pathologic fracture. EXAM: CT CHEST, ABDOMEN, AND PELVIS WITH CONTRAST TECHNIQUE: Multidetector CT imaging of the chest, abdomen and pelvis was performed following the standard protocol during bolus administration of intravenous contrast. CONTRAST:  164mL OMNIPAQUE IOHEXOL 300 MG/ML  SOLN COMPARISON:  None. FINDINGS: CT CHEST FINDINGS Cardiovascular: Coronary artery bypass grafting has been performed. Mild global cardiomegaly with particular left atrial enlargement. Left subclavian pacemaker leads are seen within the right atrial appendage and right ventricle toward the apex. No pericardial effusion. The central pulmonary arteries are enlarged in keeping with changes of pulmonary arterial hypertension. There is extensive atherosclerotic calcification within the aortic arch and descending thoracic aorta, particularly at the origin of the arch vasculature. Hemodynamically significant stenoses of the innominate and left common carotid artery may be present, but are not well characterized on this non arteriographic study. No aortic aneurysm. Mediastinum/Nodes: There is no pathologic thoracic adenopathy. There is infiltrative soft tissue encasing the right mainstem bronchus 2 within 6 mm of the carina. Moderate hiatal hernia. Debris and contrast within the esophagus may reflect changes of gastroesophageal reflux or esophageal  dysmotility. The visualized thyroid is unremarkable. Lungs/Pleura: A spiculated mass is seen within the right upper lobe extending to the hilum measuring at least 4.7 x 4.5 cm at axial image # 61/7 demonstrating retraction of the adjacent visceral pleura. A second rounded mass is seen within the subpleural dependent right lower lobe measuring 2.6 x 4.5 cm at axial image # 117/7. The findings are suspicious for synchronous primary bronchogenic neoplasm. A a small right pleural effusion is present. There is parenchymal scarring within the left upper lobe. Mild subpleural fibrotic change noted within the left lower lobe. The right mainstem bronchus is narrowed by the circumferential soft tissue rind described above. Musculoskeletal: There are erosive changes involving the a right third rib laterally adjacent to the right upper lobe pulmonary mass in keeping with osseous metastatic disease. CT ABDOMEN PELVIS FINDINGS Hepatobiliary: No focal liver abnormality is seen. No gallstones, gallbladder wall thickening, or biliary dilatation. Pancreas: Unremarkable Spleen: Unremarkable Adrenals/Urinary Tract: Adrenal glands are unremarkable. Kidneys are normal, without renal calculi, focal lesion, or hydronephrosis. Bladder is unremarkable. Stomach/Bowel: Stomach is within normal limits. Appendix appears normal. No evidence of bowel wall thickening, distention, or inflammatory changes. No free intraperitoneal gas or fluid. Vascular/Lymphatic: Extensive aortoiliac atherosclerotic calcification with particularly prominent atherosclerotic calcification at the renal ostia bilaterally. Extensive calcification within the left common iliac artery proximally. No aortic aneurysm. No pathologic adenopathy within the abdomen and pelvis Reproductive: Brachytherapy seeds are seen within the prostate gland. Seminal vesicles are unremarkable. Other: Tiny bilateral fat containing inguinal hernias are present. Rectum unremarkable.  Musculoskeletal: Lytic metastasis to the L2 vertebral body is again identified, better seen on lumbar CTd examination of 10/17/2020 extending into the right paraspinal soft tissues measuring at least 4.5 x 6.0 cm at axial image # 66/3. Small ventral epidural component noted at this level. Malignant disease erodes much of the right lateral aspect of the vertebral body, however, no associated compression deformity is noted at this  time. No other lytic or blastic bone lesions are seen. IMPRESSION: Multiple pulmonary masses, as described above, suspicious for synchronous right upper lobe and right lower lobe bronchogenic neoplasms. Right upper lobe mass appears confluent with a rind of soft tissue which encases the right mainstem bronchus, approaching within 6 mm of the carina, and resulting in mild narrowing of the a mainstem bronchus as well as the central lobar bronchi of the right lung. Osseous metastatic disease to the right third rib laterally as well as the L2 vertebral body, best described on prior CT examination of 10/17/2020. Small epidural component noted involving the L2 vertebral body which is largely eroded. Surgical consultation is advised. Small right pleural effusion. Mild cardiomegaly. Enlargement of the central pulmonary arteries in keeping with pulmonary arterial hypertension. Moderate hiatal hernia with debris within the distal esophagus related to reflux or esophageal dysmotility. Aortic Atherosclerosis (ICD10-I70.0). Electronically Signed   By: Fidela Salisbury MD   On: 10/20/2020 02:11   DG HIP UNILAT WITH PELVIS 2-3 VIEWS RIGHT  Result Date: 10/01/2020 CLINICAL DATA:  Right groin pain.  Fall 08/16/2020 EXAM: DG HIP (WITH OR WITHOUT PELVIS) 2-3V RIGHT COMPARISON:  None. FINDINGS: No evidence of acute or healing fracture. Mild right hip joint space narrowing with peripheral acetabular spurring. The pubic rami are intact. Pubic symphysis and sacroiliac joints are congruent. Evidence of a vascular  necrosis or focal bone lesion. Moderate vascular calcifications. IMPRESSION: 1. No acute or healing fracture of the right hip. 2. Mild right hip osteoarthritis. Electronically Signed   By: Keith Rake M.D.   On: 10/01/2020 17:24   XR Lumbar Spine 2-3 Views  Result Date: 10/16/2020 2 views of the lumbar spine show severe degenerative changes at multiple levels throughout the lumbar spine.  Compared to previous films, there may be a slight compression deformity L3.  There is severe degenerative changes between L1 and L2 as well as between L4 and L5 and L5 and S1.  XR Lumbar Spine 2-3 Views  Result Date: 10/02/2020 2 views of the lumbar spine show multiple degenerative changes with the space narrowing and osteophytes throughout the lumbar spine.  There are changes around L2 suggestive of the acute to subacute compression fracture.  There is less than 20% loss of height.     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  We discussed the natural history of lung cancer and general treatment, highlighting the role of radiotherapy in the management.  We discussed the available radiation techniques, and focused on the details of logistics and delivery.  We reviewed the anticipated acute and late sequelae associated with radiation in this setting.  The patient was encouraged to ask questions that I answered to the best of my ability. *** A patient consent form was discussed and signed.  We retained a copy for our records.  The patient would like to proceed with radiation and will be scheduled for CT simulation.  PLAN: ***   Total time spent in this encounter was *** minutes which included reviewing the patient's most recent ED visit, admission, CT scans, consultations, follow-ups, physical examination, and  documentation.  ------------------------------------------------  Blair Promise, PhD, MD  This document serves as a record of services personally performed by Gery Pray, MD. It was created on his behalf by Clerance Lav, a trained medical scribe. The creation of this record is based  on the scribe's personal observations and the provider's statements to them. This document has been checked and approved by the attending provider.

## 2020-10-22 NOTE — Telephone Encounter (Signed)
Pt's son wants to cancel pts pacer chk on 03/03. Patient is currently in the hospital and is not sure when he will be discharged.

## 2020-10-22 NOTE — Anesthesia Postprocedure Evaluation (Signed)
Anesthesia Post Note  Patient: Jonathan Warner  Procedure(s) Performed: VIDEO BRONCHOSCOPY WITH ENDOBRONCHIAL NAVIGATION (Right ) ENDOBRONCHIAL ULTRASOUND (N/A ) BRONCHIAL BIOPSIES BRONCHIAL BRUSHINGS BRONCHIAL NEEDLE ASPIRATION BIOPSIES     Patient location during evaluation: PACU Anesthesia Type: General Level of consciousness: awake and alert Pain management: pain level controlled Vital Signs Assessment: post-procedure vital signs reviewed and stable Respiratory status: spontaneous breathing, nonlabored ventilation, respiratory function stable and patient connected to nasal cannula oxygen Cardiovascular status: blood pressure returned to baseline and stable Postop Assessment: no apparent nausea or vomiting Anesthetic complications: no   No complications documented.  Last Vitals:  Vitals:   10/22/20 1700 10/22/20 1712  BP: 137/67 (!) 158/65  Pulse: 74 71  Resp: 18 20  Temp:    SpO2: 95% 99%    Last Pain:  Vitals:   10/22/20 1712  TempSrc:   PainSc: 0-No pain                 Belenda Cruise P Kei Langhorst

## 2020-10-23 ENCOUNTER — Ambulatory Visit: Payer: Medicare Other | Admitting: Orthopaedic Surgery

## 2020-10-23 ENCOUNTER — Telehealth: Payer: Self-pay | Admitting: *Deleted

## 2020-10-23 ENCOUNTER — Other Ambulatory Visit: Payer: Self-pay

## 2020-10-23 ENCOUNTER — Encounter (HOSPITAL_COMMUNITY): Payer: Self-pay | Admitting: Pulmonary Disease

## 2020-10-23 ENCOUNTER — Other Ambulatory Visit: Payer: Self-pay | Admitting: Radiology

## 2020-10-23 DIAGNOSIS — M4850XA Collapsed vertebra, not elsewhere classified, site unspecified, initial encounter for fracture: Secondary | ICD-10-CM | POA: Diagnosis not present

## 2020-10-23 DIAGNOSIS — G893 Neoplasm related pain (acute) (chronic): Secondary | ICD-10-CM | POA: Diagnosis not present

## 2020-10-23 DIAGNOSIS — R918 Other nonspecific abnormal finding of lung field: Secondary | ICD-10-CM | POA: Diagnosis not present

## 2020-10-23 DIAGNOSIS — C61 Malignant neoplasm of prostate: Secondary | ICD-10-CM

## 2020-10-23 DIAGNOSIS — C3431 Malignant neoplasm of lower lobe, right bronchus or lung: Secondary | ICD-10-CM | POA: Diagnosis not present

## 2020-10-23 DIAGNOSIS — C3411 Malignant neoplasm of upper lobe, right bronchus or lung: Secondary | ICD-10-CM | POA: Diagnosis not present

## 2020-10-23 DIAGNOSIS — Z515 Encounter for palliative care: Secondary | ICD-10-CM | POA: Diagnosis not present

## 2020-10-23 DIAGNOSIS — M8458XA Pathological fracture in neoplastic disease, other specified site, initial encounter for fracture: Secondary | ICD-10-CM | POA: Diagnosis not present

## 2020-10-23 DIAGNOSIS — I480 Paroxysmal atrial fibrillation: Secondary | ICD-10-CM

## 2020-10-23 DIAGNOSIS — C7951 Secondary malignant neoplasm of bone: Secondary | ICD-10-CM | POA: Diagnosis not present

## 2020-10-23 LAB — CBC
HCT: 36.3 % — ABNORMAL LOW (ref 39.0–52.0)
Hemoglobin: 11.9 g/dL — ABNORMAL LOW (ref 13.0–17.0)
MCH: 31.2 pg (ref 26.0–34.0)
MCHC: 32.8 g/dL (ref 30.0–36.0)
MCV: 95.3 fL (ref 80.0–100.0)
Platelets: 193 10*3/uL (ref 150–400)
RBC: 3.81 MIL/uL — ABNORMAL LOW (ref 4.22–5.81)
RDW: 14.1 % (ref 11.5–15.5)
WBC: 4.6 10*3/uL (ref 4.0–10.5)
nRBC: 0 % (ref 0.0–0.2)

## 2020-10-23 LAB — APTT
aPTT: 43 seconds — ABNORMAL HIGH (ref 24–36)
aPTT: 94 seconds — ABNORMAL HIGH (ref 24–36)

## 2020-10-23 LAB — BASIC METABOLIC PANEL
Anion gap: 12 (ref 5–15)
BUN: 25 mg/dL — ABNORMAL HIGH (ref 8–23)
CO2: 27 mmol/L (ref 22–32)
Calcium: 8.1 mg/dL — ABNORMAL LOW (ref 8.9–10.3)
Chloride: 99 mmol/L (ref 98–111)
Creatinine, Ser: 1.08 mg/dL (ref 0.61–1.24)
GFR, Estimated: >60 mL/min (ref >60)
Glucose, Bld: 157 mg/dL — ABNORMAL HIGH (ref 70–99)
Potassium: 3.3 mmol/L — ABNORMAL LOW (ref 3.5–5.1)
Sodium: 138 mmol/L (ref 135–145)

## 2020-10-23 LAB — HEPARIN LEVEL (UNFRACTIONATED)
Heparin Unfractionated: 0.61 IU/mL (ref 0.30–0.70)
Heparin Unfractionated: 1.02 IU/mL — ABNORMAL HIGH (ref 0.30–0.70)

## 2020-10-23 LAB — LACTATE DEHYDROGENASE: LDH: 197 U/L — ABNORMAL HIGH (ref 98–192)

## 2020-10-23 LAB — SURGICAL PATHOLOGY

## 2020-10-23 MED ORDER — ACETAMINOPHEN 500 MG PO TABS
1000.0000 mg | ORAL_TABLET | Freq: Three times a day (TID) | ORAL | Status: DC
  Administered 2020-10-23 – 2020-10-31 (×24): 1000 mg via ORAL
  Filled 2020-10-23 (×24): qty 2

## 2020-10-23 MED ORDER — POTASSIUM CHLORIDE CRYS ER 20 MEQ PO TBCR
40.0000 meq | EXTENDED_RELEASE_TABLET | Freq: Once | ORAL | Status: AC
  Administered 2020-10-23: 40 meq via ORAL
  Filled 2020-10-23: qty 2

## 2020-10-23 NOTE — Care Management Important Message (Signed)
Important Message  Patient Details  Name: Jonathan Warner MRN: [id number] Date of Birth: 1932-03-28   Medicare Important Message Given:  Yes     [name] 10/23/2020, 4:05 PM

## 2020-10-23 NOTE — Consult Note (Addendum)
Chief Complaint: Patient was seen in consultation today for L2 pathologic compression fracture/osteocool ablation with vertebral augmentation.  Referring Physician(s): [place] Donnamarie Poag [facility])  Supervising Physician: Luanne Bras  Patient Status: [facility] - In-pt  History of Present Illness: Jonathan Warner is a 87 y.o. male with a past medical history of hypertension, hyperlipidemia, atrial fibrillation on chronic anticoagulation with Eliquis (LD 10/19/2020), tachybradycardia, CAD s/p CABG, HF, hiatal hernia, GERD, diverticulosis, CKD stage III, BPH, prostate cancer s/p radiation and resection, undescended left testicle, glaucoma, cataract, and arthritis. Around Christmas 2021, patient had a fall and was subsequently diagnosed with a L2 compression fracture. He followed-up with orthopedic surgery on OP basis. Pain progressed, so further imaging was obtained which revealed L2 pathological compression fracture along with possible metastatic disease. Pain was unable to be controlled on OP basis, so patient presented to [facility] ED 10/19/2020 for further management. He was admitted for further management. Oncology was consulted. Further imaging revealed possible metastatic disease (pulmonary masses). He underwent bronchoscopy with lung mass biopsies 10/22/2020, pathology pending. He still suffers from severe low back pain, currently on PCA for pain control.  CT chest/abdomen/pelvis 10/20/2020: 1. Multiple pulmonary masses, as described above, suspicious for synchronous right upper lobe and right lower lobe bronchogenic neoplasms. Right upper lobe mass appears confluent with a rind of soft tissue which encases the right mainstem bronchus, approaching within 6 mm of the carina, and resulting in mild narrowing of the a mainstem bronchus as well as the central lobar bronchi of the right lung. Osseous metastatic disease to the right third rib laterally as well as the L2 vertebral body, best described on prior CT  examination of 10/17/2020. Small epidural component noted involving the L2 vertebral body which is largely eroded. Surgical consultation is advised. 2. Small right pleural effusion. 3. Mild cardiomegaly. Enlargement of the central pulmonary arteries in keeping with pulmonary arterial hypertension. 4. Moderate hiatal hernia with debris within the distal esophagus related to reflux or esophageal dysmotility. 5. Aortic Atherosclerosis (ICD10-I70.0).  CT lumbar spine 10/17/2020: 1. Pathologic fracture L2 vertebral body due to metastatic disease or possibly myeloma. There is extraosseous tumor extension on the right at L2. No compromise of the spinal canal. This has a lytic appearance suggesting metastatic carcinoma however metastatic prostate cancer is possible. Correlate with PSA. 2. Multilevel degenerative change throughout the lumbar spine. 3. Atherosclerotic aorta.  IR consulted by Dr. [place] for management of L2 pathologic compression fracture. Patient awake and alert laying in bed watching TV. Complains of low back pain, rated 8-9/10 despite PCA in place. Denies numbness/tingling down bilateral legs. Denies fever, chills, chest pain, dyspnea, abdominal pain, or headache.  On Heparin gtt.    Past Medical History:  Diagnosis Date  . Arthritis   . BPH (benign prostatic hyperplasia)   . CAD (coronary artery disease)    s/p CABG 1991  . Carotid artery occlusion   . Cataract    b/l surgery  . Chronic kidney disease (CKD), stage III (moderate) (HCC)   . Diverticulosis   . DJD (degenerative joint disease)   . DJD (degenerative joint disease)   . GERD (gastroesophageal reflux disease)   . Glaucoma   . Hiatal hernia   . History of adenomatous polyp of colon 06/07/13   Last colonoscopy 06/27/13 showed small adenoma, no further testing due to advanced age.  [name] History of radiation therapy 07/25/2012-09/20/2012   prostate 7800 cGy 40 sessions, seminal vesicles 5600 cGy 40 sessions  .  History of shingles   .  Hypercholesteremia   . Hyperlipidemia   . Hypertension   . Inguinal hernia   . Osteomyelitis of forearm, right, acute ([place])    drainage x 2  . Persistent atrial fibrillation ([place])   . Prostate cancer ([place]) 04/27/12   Dr. Eulogio Ditch. Radiation therapy. Adenocarcinoma,gleason=3+4=7, 3+3=6,PSA=4.93  volume=51cc  . Pulmonary nodules    small rlung base nodule 4.67mm, scarring in lung bases  . 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 BRONCHIAL BIOPSY  10/22/2020   Procedure: BRONCHIAL BIOPSIES;  Surgeon: Garner Nash, DO;  Location: [place] ENDOSCOPY;  Service: Pulmonary;;  . BRONCHIAL BRUSHINGS  10/22/2020   Procedure: BRONCHIAL BRUSHINGS;  Surgeon: Garner Nash, DO;  Location: [place] ENDOSCOPY;  Service: Pulmonary;;  . BRONCHIAL NEEDLE ASPIRATION BIOPSY  10/22/2020   Procedure: BRONCHIAL NEEDLE ASPIRATION BIOPSIES;  Surgeon: Garner Nash, DO;  Location: [place];  Service: Pulmonary;;  . CARDIAC CATHETERIZATION  06/04/09   left w/noted patent bypass grafts  . CATARACT EXTRACTION W/ INTRAOCULAR LENS IMPLANT  2011  . COLONOSCOPY  12/21/2007   hx polyps, diverticulosis  . COLONOSCOPY WITH PROPOFOL N/A 06/27/2013   Procedure: COLONOSCOPY WITH PROPOFOL;  Surgeon: Garlan Fair, MD;  Location: [facility] ENDOSCOPY;  Service: Endoscopy;  Laterality: N/A;  . CORONARY ARTERY BYPASS GRAFT  1991   x5; prior MI  . ENDOBRONCHIAL ULTRASOUND N/A 10/22/2020   Procedure: ENDOBRONCHIAL ULTRASOUND;  Surgeon: Garner Nash, DO;  Location: [place];  Service: Pulmonary;  Laterality: N/A;  . PACEMAKER GENERATOR CHANGE  05/24/12  . PACEMAKER INSERTION  05/14/03   MDT EnPulse implanted by Dr Leonia Reeves ddd  . PERMANENT PACEMAKER GENERATOR CHANGE N/A 05/24/2012   Procedure: PERMANENT PACEMAKER GENERATOR CHANGE;  Surgeon: Thompson Grayer, MD;  Location: [facility] CATH LAB;   Service: Cardiovascular;  Laterality: N/A;  . PROSTATE BIOPSY  04/27/12   Adenocarcinoma,gleason=3+3=6, 3+4,& 4+3,PSA=4.93,volume=51cc  . TUMOR REMOVAL     skin tumors removed  . VIDEO BRONCHOSCOPY WITH ENDOBRONCHIAL NAVIGATION Right 10/22/2020   Procedure: VIDEO BRONCHOSCOPY WITH ENDOBRONCHIAL NAVIGATION;  Surgeon: Garner Nash, DO;  Location: [place];  Service: Pulmonary;  Laterality: Right;    Allergies: Pilocarpine and Rosuvastatin  Medications: Prior to Admission medications   Medication Sig Start Date End Date Taking? Authorizing Provider  acetaminophen (TYLENOL) 650 MG CR tablet Take 650 mg by mouth every 8 (eight) hours as needed for pain.   Yes [provider]  amLODipine (NORVASC) 5 MG tablet Take 1 tablet (5 mg total) by mouth daily. 07/10/20  Yes Belva Crome, MD  apixaban (ELIQUIS) 5 MG TABS tablet Take 5 mg by mouth 2 (two) times daily.   Yes Josetta Huddle, MD  brimonidine (ALPHAGAN) 0.2 % ophthalmic solution Place 1 drop into both eyes 3 (three) times daily.  09/27/18  Yes [provider]  Cholecalciferol (VITAMIN D3) 2000 units TABS Take 2,000 Units by mouth daily.   Yes [provider]  dorzolamide (TRUSOPT) 2 % ophthalmic solution Place 1 drop into both eyes 3 (three) times daily.  11/20/17  Yes [provider]  furosemide (LASIX) 40 MG tablet Take 2 tablets by mouth every morning.  Take one tablet by mouth every evening. Patient taking differently: Take 80 mg by mouth daily. 09/04/20  Yes Belva Crome, MD  HYDROcodone-acetaminophen (NORCO/VICODIN) 5-325 MG tablet TAKE ONE TABLET BY MOUTH EVERY  6 HOURS AS NEEDED FOR moderate pain Patient taking differently: Take 1 tablet by mouth every 6 (six) hours as needed for moderate pain. 10/18/20  Yes Mcarthur Rossetti, MD  latanoprost (XALATAN) 0.005 % ophthalmic solution Place 1 drop into both eyes at bedtime.   Yes [provider]  Multiple Vitamin (MULTIVITAMIN) tablet Take  1 tablet by mouth daily.   Yes [provider]  nitroGLYCERIN (NITROSTAT) 0.4 MG SL tablet PLACE 1 TABLET UNDER TONGUE EVERY FIVE MINUTES AS NEEDED FOR CHEST PAIN. 12/08/19  Yes Belva Crome, MD  omeprazole (PRILOSEC) 20 MG capsule Take 20 mg by mouth daily.   Yes [provider]  potassium chloride SA (KLOR-CON M20) 20 MEQ tablet Take 1 tablet (20 mEq total) by mouth 2 (two) times daily. Patient taking differently: Take 40 mEq by mouth daily. 08/02/20  Yes Belva Crome, MD  trolamine salicylate (BLUE-EMU HEMP) 10 % cream Apply 1 application topically as needed for muscle pain.   Yes [provider]  vitamin E 400 UNIT capsule Take 400 Units by mouth daily.   Yes [provider]  traMADol (ULTRAM) 50 MG tablet TAKE ONE TABLET BY MOUTH EVERY 6 HOURS AS NEEDED Patient not taking: Reported on 10/19/2020 10/11/20   Mcarthur Rossetti, MD     Family History  Problem Relation Age of Onset  . Hypertension Mother   . Congestive Heart Failure Mother   . CAD Mother   . Alzheimer's disease Father   . CAD Brother   . Pancreatic cancer Brother   . CVA Brother   . Hypertension Sister        Has 6 sisters, many suffer from HTN  . Heart attack Brother   . Stroke Brother     Social History   Socioeconomic History  . Marital status: Married    Spouse name: Not on file  . Number of children: 1  . Years of education: Not on file  . Highest education level: Not on file  Occupational History  . Occupation: Retired    Comment: Theatre stage manager units  Tobacco Use  . Smoking status: Former Smoker    Packs/day: 0.50    Years: 10.00    Pack years: 5.00    Types: Cigarettes    Quit date: 08/24/1981    Years since quitting: 39.1  . Smokeless tobacco: Never Used  Vaping Use  . Vaping Use: Never used  Substance and Sexual Activity  . Alcohol use: No  . Drug use: No  . Sexual activity: Not on file  Other Topics Concern  . Not on file   Social History Narrative   Retired Hotel manager.  Lives in [place].   Social Determinants of Health   Financial Resource Strain: Not on file  Food Insecurity: Not on file  Transportation Needs: Not on file  Physical Activity: Not on file  Stress: Not on file  Social Connections: Not on file     Review of Systems: A 12 point ROS discussed and pertinent positives are indicated in the HPI above.  All other systems are negative.  Review of Systems  Constitutional: Negative for chills and fever.  Respiratory: Negative for shortness of breath and wheezing.   Cardiovascular: Negative for chest pain and palpitations.  Gastrointestinal: Negative for abdominal pain.  Musculoskeletal: Positive for back pain.  Neurological: Negative for numbness.  Psychiatric/Behavioral: Negative for behavioral problems and confusion.    Vital Signs: BP (!) 151/98 (BP Location: Right Arm)  Pulse 82   Temp (!) 97.4 F (36.3 C) (Oral)   Resp 16   Ht 5\' 8"  (1.727 m)   Wt 155 lb (70.3 kg)   SpO2 99%   BMI 23.57 kg/m   Physical Exam Vitals and nursing note reviewed.  Constitutional:      General: He is not in acute distress. Cardiovascular:     Rate and Rhythm: Normal rate and regular rhythm.     Heart sounds: Normal heart sounds. No murmur heard.   Pulmonary:     Effort: Pulmonary effort is normal. No respiratory distress.     Breath sounds: Normal breath sounds. No wheezing.  Musculoskeletal:     Comments: Moderate-severe midline low back tenderness.  Skin:    General: Skin is warm and dry.  Neurological:     Mental Status: He is alert and oriented to person, place, and time.     Comments: Can spontaneously move all extremities.      MD Evaluation Airway: WNL Heart: WNL Abdomen: WNL Chest/ Lungs: WNL ASA  Classification: 3 Mallampati/Airway Score: One   Imaging: CT HEAD W & WO CONTRAST  Result Date: 10/20/2020 CLINICAL DATA:  87 year old male with evidence of  metastatic disease on recent CT Chest, Abdomen, and Pelvis. Unable to have MRI. EXAM: CT HEAD WITHOUT AND WITH CONTRAST TECHNIQUE: Contiguous axial images were obtained from the base of the skull through the vertex without and with intravenous contrast CONTRAST:  30mL OMNIPAQUE IOHEXOL 300 MG/ML  SOLN COMPARISON:  Plain head CT 05/16/2020. FINDINGS: Brain: Stable cerebral volume. No midline shift, ventriculomegaly, mass effect, evidence of mass lesion, intracranial hemorrhage or evidence of cortically based acute infarction. Stable gray-white matter differentiation throughout the brain. No evidence of vasogenic edema. Small chronic lacunar infarcts suspected in the left caudate (series 5, image 30) and stable. No abnormal enhancement identified. Vascular: Mild Calcified atherosclerosis at the skull base. The major intracranial vascular structures are enhancing as expected. Skull: Stable since September. Small circumscribed sclerotic area in the left parietal bone (series 4, image 68) is probably a benign bone island. No other suspicious osseous lesion. Anterior C1-C2 degeneration incidentally noted. Sinuses/Orbits: Visualized paranasal sinuses and mastoids are stable and well pneumatized. Other: No acute orbit or scalp soft tissue finding. IMPRESSION: No metastatic disease or acute intracranial abnormality identified. Stable Head CT since September. Electronically Signed   By: Genevie Ann M.D.   On: 10/20/2020 10:32   CT LUMBAR SPINE WO CONTRAST  Result Date: 10/17/2020 CLINICAL DATA:  Lumbar compression fracture. Fall December 2021. History of prostate cancer EXAM: CT LUMBAR SPINE WITHOUT CONTRAST TECHNIQUE: Multidetector CT imaging of the lumbar spine was performed without intravenous contrast administration. Multiplanar CT image reconstructions were also generated. COMPARISON:  Lumbar radiographs 10/16/2020, 10/02/2020 FINDINGS: Segmentation: Normal Alignment: Mild retrolisthesis L1-2 and L2-3. Mild  anterolisthesis L3-4. Vertebrae: Pathologic fracture of L2. No bone lesion on the prior 2012 CT. There is lytic destruction of much of the L2 vertebral body, right greater than left. Tumor extends to the anterior pedicle on the right of L2. There is extraosseous extension of tumor into the right paraspinous soft tissues and right psoas muscle. No other fracture or bone lesion identified in the lumbar spine. Paraspinal and other soft tissues: Paraspinous tumor to the right of the L2 vertebral body compatible with tumor extension. This mass invades the psoas muscle measures approximately 2.5 x 3.3 cm. No retroperitoneal adenopathy Atherosclerotic aorta without aneurysm. Hiatal hernia Disc levels: T12-L1: Mild disc degeneration.  Negative  for stenosis L1-2: Mild disc degeneration.  Negative for spinal stenosis L2-3: Mild disc degeneration.  Negative for stenosis. L3-4: Disc degeneration with disc bulging and spurring. Bilateral facet hypertrophy. Mild spinal stenosis L4-5: Disc degeneration with diffuse endplate spurring and bilateral facet hypertrophy. Moderate right subarticular stenosis due to spurring. L5-S1: Disc degeneration and diffuse endplate spurring. Bilateral facet degeneration. Mild to moderate foraminal stenosis bilaterally due to spurring. IMPRESSION: Pathologic fracture L2 vertebral body due to metastatic disease or possibly myeloma. There is extraosseous tumor extension on the right at L2. No compromise of the spinal canal. This has a lytic appearance suggesting metastatic carcinoma however metastatic prostate cancer is possible. Correlate with PSA. Multilevel degenerative change throughout the lumbar spine. Atherosclerotic aorta Electronically Signed   By: Franchot Gallo M.D.   On: 10/17/2020 08:43   NM Bone Scan Whole Body  Result Date: 10/22/2020 CLINICAL DATA:  Non-small cell lung cancer, staging, L2 fracture EXAM: NUCLEAR MEDICINE WHOLE BODY BONE SCAN TECHNIQUE: Whole body anterior and  posterior images were obtained approximately 3 hours after intravenous injection of radiopharmaceutical. RADIOPHARMACEUTICALS:  21.3 mCi Technetium-81m MDP IV COMPARISON:  05/12/2012 Correlation: CT chest abdomen pelvis 10/20/2020, CT lumbar spine 10/17/2020 FINDINGS: Abnormal tracer uptake at L2 corresponding to metastasis with pathologic fracture on CT. Abnormal tracer uptake at lateral RIGHT seventh rib without accompanying CT finding. Abnormal tracer uptake is seen at the anterior and lateral RIGHT third rib, with the uptake at the lateral RIGHT third rib corresponding to an area of bone destruction on CT series 7, image 53. Uptake at the shoulders, sternoclavicular joints, elbows, wrists, and knees, typically degenerative. No additional worrisome sites of tracer uptake are seen to suggest osseous metastatic disease. Expected urinary tract and soft tissue distribution of tracer. IMPRESSION: Uptake at L2 vertebral body, RIGHT seventh rib, and 2 sites in the RIGHT third rib suspicious for osseous metastases. Uptake at L2 and lateral RIGHT third rib correspond to destructive lesions on CT. No CT findings are seen at the anterior RIGHT third rib or lateral RIGHT seventh rib sites of bone scan abnormality. Electronically Signed   By: Lavonia Dana M.D.   On: 10/22/2020 16:08   CT CHEST ABDOMEN PELVIS W CONTRAST  Result Date: 10/20/2020 CLINICAL DATA:  Low back pain, L2 pathologic fracture. EXAM: CT CHEST, ABDOMEN, AND PELVIS WITH CONTRAST TECHNIQUE: Multidetector CT imaging of the chest, abdomen and pelvis was performed following the standard protocol during bolus administration of intravenous contrast. CONTRAST:  160mL OMNIPAQUE IOHEXOL 300 MG/ML  SOLN COMPARISON:  None. FINDINGS: CT CHEST FINDINGS Cardiovascular: Coronary artery bypass grafting has been performed. Mild global cardiomegaly with particular left atrial enlargement. Left subclavian pacemaker leads are seen within the right atrial appendage and right  ventricle toward the apex. No pericardial effusion. The central pulmonary arteries are enlarged in keeping with changes of pulmonary arterial hypertension. There is extensive atherosclerotic calcification within the aortic arch and descending thoracic aorta, particularly at the origin of the arch vasculature. Hemodynamically significant stenoses of the innominate and left common carotid artery may be present, but are not well characterized on this non arteriographic study. No aortic aneurysm. Mediastinum/Nodes: There is no pathologic thoracic adenopathy. There is infiltrative soft tissue encasing the right mainstem bronchus 2 within 6 mm of the carina. Moderate hiatal hernia. Debris and contrast within the esophagus may reflect changes of gastroesophageal reflux or esophageal dysmotility. The visualized thyroid is unremarkable. Lungs/Pleura: A spiculated mass is seen within the right upper lobe extending to the hilum measuring  at least 4.7 x 4.5 cm at axial image # 61/7 demonstrating retraction of the adjacent visceral pleura. A second rounded mass is seen within the subpleural dependent right lower lobe measuring 2.6 x 4.5 cm at axial image # 117/7. The findings are suspicious for synchronous primary bronchogenic neoplasm. A a small right pleural effusion is present. There is parenchymal scarring within the left upper lobe. Mild subpleural fibrotic change noted within the left lower lobe. The right mainstem bronchus is narrowed by the circumferential soft tissue rind described above. Musculoskeletal: There are erosive changes involving the a right third rib laterally adjacent to the right upper lobe pulmonary mass in keeping with osseous metastatic disease. CT ABDOMEN PELVIS FINDINGS Hepatobiliary: No focal liver abnormality is seen. No gallstones, gallbladder wall thickening, or biliary dilatation. Pancreas: Unremarkable Spleen: Unremarkable Adrenals/Urinary Tract: Adrenal glands are unremarkable. Kidneys are  normal, without renal calculi, focal lesion, or hydronephrosis. Bladder is unremarkable. Stomach/Bowel: Stomach is within normal limits. Appendix appears normal. No evidence of bowel wall thickening, distention, or inflammatory changes. No free intraperitoneal gas or fluid. Vascular/Lymphatic: Extensive aortoiliac atherosclerotic calcification with particularly prominent atherosclerotic calcification at the renal ostia bilaterally. Extensive calcification within the left common iliac artery proximally. No aortic aneurysm. No pathologic adenopathy within the abdomen and pelvis Reproductive: Brachytherapy seeds are seen within the prostate gland. Seminal vesicles are unremarkable. Other: Tiny bilateral fat containing inguinal hernias are present. Rectum unremarkable. Musculoskeletal: Lytic metastasis to the L2 vertebral body is again identified, better seen on lumbar CTd examination of 10/17/2020 extending into the right paraspinal soft tissues measuring at least 4.5 x 6.0 cm at axial image # 66/3. Small ventral epidural component noted at this level. Malignant disease erodes much of the right lateral aspect of the vertebral body, however, no associated compression deformity is noted at this time. No other lytic or blastic bone lesions are seen. IMPRESSION: Multiple pulmonary masses, as described above, suspicious for synchronous right upper lobe and right lower lobe bronchogenic neoplasms. Right upper lobe mass appears confluent with a rind of soft tissue which encases the right mainstem bronchus, approaching within 6 mm of the carina, and resulting in mild narrowing of the a mainstem bronchus as well as the central lobar bronchi of the right lung. Osseous metastatic disease to the right third rib laterally as well as the L2 vertebral body, best described on prior CT examination of 10/17/2020. Small epidural component noted involving the L2 vertebral body which is largely eroded. Surgical consultation is advised.  Small right pleural effusion. Mild cardiomegaly. Enlargement of the central pulmonary arteries in keeping with pulmonary arterial hypertension. Moderate hiatal hernia with debris within the distal esophagus related to reflux or esophageal dysmotility. Aortic Atherosclerosis (ICD10-I70.0). Electronically Signed   By: Fidela Salisbury MD   On: 10/20/2020 02:11   DG CHEST PORT 1 VIEW  Result Date: 10/22/2020 CLINICAL DATA:  Pulmonary masses, status post bronchoscopy with biopsy EXAM: PORTABLE CHEST 1 VIEW COMPARISON:  CT chest 10/20/2020 FINDINGS: Dual lead pacer in place. Prior CABG. Atherosclerotic calcification of the aortic arch. No pneumothorax. Stable appearance of mass in the right upper lobe. Indistinct lingular density compatible with previously seen pleural thickening and scarring. Stable right perihilar fullness likely from adenopathy. No blunting of the right costophrenic angle. Stable mild cardiomegaly. IMPRESSION: 1. No pneumothorax status post bronchoscopy. 2. Stable appearance of the right upper lobe mass and right perihilar adenopathy. 3. Stable mild cardiomegaly. 4. Lingular pleural thickening and scarring. Electronically Signed   By: Thayer Jew  Janeece Fitting M.D.   On: 10/22/2020 17:09   DG HIP UNILAT WITH PELVIS 2-3 VIEWS RIGHT  Result Date: 10/01/2020 CLINICAL DATA:  Right groin pain.  Fall 08/16/2020 EXAM: DG HIP (WITH OR WITHOUT PELVIS) 2-3V RIGHT COMPARISON:  None. FINDINGS: No evidence of acute or healing fracture. Mild right hip joint space narrowing with peripheral acetabular spurring. The pubic rami are intact. Pubic symphysis and sacroiliac joints are congruent. Evidence of a vascular necrosis or focal bone lesion. Moderate vascular calcifications. IMPRESSION: 1. No acute or healing fracture of the right hip. 2. Mild right hip osteoarthritis. Electronically Signed   By: Keith Rake M.D.   On: 10/01/2020 17:24   XR Lumbar Spine 2-3 Views  Result Date: 10/16/2020 2 views of the lumbar  spine show severe degenerative changes at multiple levels throughout the lumbar spine.  Compared to previous films, there may be a slight compression deformity L3.  There is severe degenerative changes between L1 and L2 as well as between L4 and L5 and L5 and S1.  XR Lumbar Spine 2-3 Views  Result Date: 10/02/2020 2 views of the lumbar spine show multiple degenerative changes with the space narrowing and osteophytes throughout the lumbar spine.  There are changes around L2 suggestive of the acute to subacute compression fracture.  There is less than 20% loss of height.  DG C-ARM BRONCHOSCOPY  Result Date: 10/22/2020 C-ARM BRONCHOSCOPY: Fluoroscopy was utilized by the requesting physician.  No radiographic interpretation.    Labs:  CBC: Recent Labs    10/20/20 0556 10/21/20 0059 10/22/20 0122 10/23/20 0547  WBC 3.3* 6.8 6.7 4.6  HGB 11.5* 11.6* 10.8* 11.9*  HCT 34.5* 36.3* 34.3* 36.3*  PLT 185 210 191 193    COAGS: Recent Labs    05/16/20 1746 10/20/20 0838 10/21/20 0059 10/21/20 1006 10/22/20 0122 10/23/20 0547  INR 1.4*  --   --   --   --   --   APTT 39*   < > 84* 85* 102* 43*   < > = values in this interval not displayed.    BMP: Recent Labs    01/30/20 0857 05/16/20 1746 07/31/20 1340 08/09/20 1305 10/19/20 1228 10/20/20 0556 10/21/20 0059 10/22/20 0122 10/23/20 0547  NA 141 135 141 139   < > 138 137 136 138  K 3.5 3.7 3.2* 3.5   < > 4.5 3.8 3.7 3.3*  CL 99 96* 99 98   < > 97* 98 97* 99  CO2 27 26 28 24    < > 28 26 29 27   GLUCOSE 128* 123* 110* 120*   < > 164* 162* 181* 157*  BUN 22 22 14  33*   < > 12 19 24* 25*  CALCIUM 9.8 8.8* 9.2 8.8   < > 9.7 9.4 8.5* 8.1*  CREATININE 1.42* 1.25* 1.25 1.21   < > 1.09 1.23 1.17 1.08  GFRNONAA 44* 52* 52* 54*   < > >60 57* >60 >60  GFRAA 51* >60 60 62  --   --   --   --   --    < > = values in this interval not displayed.    LIVER FUNCTION TESTS: Recent Labs    11/10/19 1038 01/30/20 1336 05/16/20 1746  10/19/20 1228  BILITOT 0.6 0.6 1.0 1.9*  AST 24 23 21 23   ALT 14 14 15 11   ALKPHOS 116 126* 83 95  PROT 6.5 6.9 6.6 6.7  ALBUMIN 4.3 4.5 3.5 3.4*  Assessment and Plan:  Symptomatic L2 pathologic compression fracture. Plan for image-guided L2 osteocool ablation with kyphoplasty/vertebroplasty and possible bone biopsy in IR tentatively for tomorrow 10/24/2020 pending IR scheduling with Dr. Estanislado Pandy and anesthesia. Dr. Estanislado Pandy at bedside, discussed upcoming procedure with patient. Patient will be NPO at midnight. Afebrile and WBCs WNL. Heparin gtt to be held 4 hours prior to procedure (will place order for holdAvon Gully, RN aware of this). INR pending. COVID negative 10/19/2020.  Risks and benefits of L2 osteocool ablation with kyphoplasty/vertebroplasty were discussed with the patient including, but not limited to education regarding the natural healing process of compression fractures without intervention, bleeding, infection, cement migration which may cause spinal cord damage, paralysis, pulmonary embolism or even death. This interventional procedure involves the use of X-rays and because of the nature of the planned procedure, it is possible that we will have prolonged use of X-ray fluoroscopy. Potential radiation risks to you include (but are not limited to) the following: - A slightly elevated risk for cancer  several years later in life. This risk is typically less than 0.5% percent. This risk is low in comparison to the normal incidence of human cancer, which is 33% for women and 50% for men according to the [facility]. - Radiation induced injury can include skin redness, resembling a rash, tissue breakdown / ulcers and hair loss (which can be temporary or permanent).  The likelihood of either of these occurring depends on the difficulty of the procedure and whether you are sensitive to radiation due to previous procedures, disease, or genetic conditions.  IF your  procedure requires a prolonged use of radiation, you will be notified and given written instructions for further action.  It is your responsibility to monitor the irradiated area for the 2 weeks following the procedure and to notify your physician if you are concerned that you have suffered a radiation induced injury.   All of the patient's questions were answered, patient is agreeable to proceed. Consent signed and in chart.   Thank you for this interesting consult.  I greatly enjoyed meeting Jonathan Warner and look forward to participating in their care.  A copy of this report was sent to the requesting provider on this date.  Electronically Signed: Earley Abide, PA-C 10/23/2020, 11:54 AM   I spent a total of 40 Minutes in face to face in clinical consultation, greater than 50% of which was counseling/coordinating care for L2 pathologic compression fracture/osteocool ablation with vertebral augmentation.

## 2020-10-23 NOTE — Progress Notes (Signed)
This chaplain is present with the Pt. for F/U spiritual care. The Pt. wife-Marie and daughter in law are bedside.  The Pt. presents himself and his family to the chaplain with a little more humor today.  The chaplain references the importance of family to the Pt. in yesterday's visit.    The chaplain clarifies with Lelan Pons clergy visitation is open and outside of the two visitor restriction. The family chooses to share with the chaplain,  the Pt. has another procedure on Thursday at 11:30.  The  chaplain held the Pt. hand and agreed to intercessory prayer.

## 2020-10-23 NOTE — Progress Notes (Signed)
Physical Therapy Treatment Patient Details Name: Daimion TAMAJ JURGENS MRN: [id number] DOB: 01/28/1932 Today's Date: 10/23/2020    History of Present Illness Tylek C Lupu is a 87 y.o. male with medical history significant of remote history of prostate CA status post surgical removal and radiation, CAD s/p CABG, chronic A. fib on Eliquis, tachybradycardia syndrome status post PPM, chronic diastolic CHF, recently diagnosed L2 metastatic CA and pathologic fracture presented with worsening of back pain and ambulation dysfunction.    PT Comments    Continuing work on functional mobility and activity tolerance;  Noting pt was attemtping to eat breakfast upon my entering, and I offered to help him OOB to eat in the recliner; Today, however, pt declined, saying he would like to stay in the bed; Took time to discuss his plans for IR procedure tomorrow, and that he will likely need the back brace post procedure; Assisted pt in getting to a comfortable position in bed; Will continue to follow   Follow Up Recommendations  Home health PT     Equipment Recommendations  3in1 (PT);Hospital bed    Recommendations for Other Services       Precautions / Restrictions Precautions Precautions: Fall;Other (comment) (back precautions for comfort) Required Braces or Orthoses: Other Brace Other Brace: abdominal binder worn very low; Likely will have an order for a brace post vertebroplasty on 3/3; pt already has an LSO Restrictions Weight Bearing Restrictions: No    Mobility  Bed Mobility Overal bed mobility: Needs Assistance             General bed mobility comments: Assisted pt in repositioning for comfort in teh bed; Educated him on teh position of the joint in the bed, and that lining his hips up with the joint typically gives more comfort; pt was able to reach up for rail with LUE and this therapist held the bed pad in order to slide him up towards [facility]; then placed a pillow under his R low back for comfort;  Pt gave the thumbs up signal re: positioning    Transfers                    Ambulation/Gait                 Stairs             Wheelchair Mobility    Modified Rankin (Stroke Patients Only)       Balance                                            Cognition Arousal/Alertness: Awake/alert Behavior During Therapy: WFL for tasks assessed/performed Overall Cognitive Status: Within Functional Limits for tasks assessed                                 General Comments: Occasionally eyes drifting to closed; a bit slower and slightly slurred speech, likely can be attributed to PCA medications      Exercises      General Comments General comments (skin integrity, edema, etc.): Discussed plans for IR kyphoplasty procedure scheduled for tomorrow      Pertinent Vitals/Pain Pain Assessment: 0-10 Pain Score: 8  ("oh, it's not so bad now, probably a 7 or 8" after repositioning in bed) Pain Location: low back and groin Pain  Descriptors / Indicators: Sore;Grimacing;Guarding Pain Intervention(s): Limited activity within patient's tolerance;Monitored during session (Noted pt has a PCA; did not use during session)    Home Living                      Prior Function            PT Goals (current goals can now be found in the care plan section) Acute Rehab PT Goals Patient Stated Goal: decr pain; today, specifically asks to work through repositioning in the bed PT Goal Formulation: With patient Time For Goal Achievement: 11/03/20 Potential to Achieve Goals: Good Progress towards PT goals: Not progressing toward goals - comment (pt choosing to stay in teh bed today)    Frequency    Min 3X/week      PT Plan Current plan remains appropriate    Co-evaluation              AM-PAC PT "6 Clicks" Mobility   Outcome Measure  Help needed turning from your back to your side while in a flat bed without using  bedrails?: A Little Help needed moving from lying on your back to sitting on the side of a flat bed without using bedrails?: A Little Help needed moving to and from a bed to a chair (including a wheelchair)?: A Little Help needed standing up from a chair using your arms (e.g., wheelchair or bedside chair)?: A Little Help needed to walk in hospital room?: A Little Help needed climbing 3-5 steps with a railing? : A Little 6 Click Score: 18    End of Session   Activity Tolerance: Patient tolerated treatment well Patient left: in bed;with call bell/phone within reach (bed in semi-chair position)   PT Visit Diagnosis: Unsteadiness on feet (R26.81);Other abnormalities of gait and mobility (R26.89);Pain Pain - part of body:  (Low back and groin)     Time: 1033-1050 PT Time Calculation (min) (ACUTE ONLY): 17 min  Charges:  $Therapeutic Activity: 8-22 mins                     Roney Marion, PT  Acute Rehabilitation Services Pager [phone] [place] 10/23/2020, 2:24 PM

## 2020-10-23 NOTE — Telephone Encounter (Signed)
Received call from Jonathan Warner, patients son requesting face to face with Dr Marin Olp regarding patients ongoing care.  Patient concerned with some of the MyChart notes he is reading. Patient assured and Dr Marin Olp aware patient will be coming in to talk with him.

## 2020-10-23 NOTE — Plan of Care (Signed)
  Problem: Health Behavior/Discharge Planning: Goal: Ability to manage health-related needs will improve Outcome: Progressing   Problem: Clinical Measurements: Goal: Will remain free from infection Outcome: Progressing   Problem: Activity: Goal: Risk for activity intolerance will decrease Outcome: Progressing

## 2020-10-23 NOTE — Progress Notes (Signed)
PROGRESS NOTE    Jonathan Warner  CBJ:[id number] DOB: 1932/06/04 DOA: 10/19/2020 PCP: Josetta Huddle, MD    Brief Narrative:  Jonathan Warner is an 86 year old male with past medical history significant for prostate cancer status post , CAD s/p CABG, chronic atrial fibrillation on Eliquis, tachybradycardia syndrome s/p PPM, chronic diastolic congestive heart failure, with recent diagnosis of metastatic L2 compression fracture who was brought to the hospital with severe pain and difficulty ambulating. Patient initially reported fell around Christmas time and has been having pain since. He is follows with orthopedics outpatient Dr. Ninfa Linden and was using a lumbar brace without much help. Patient denies any urinary problems or difficulty moving his bowels. No numbness or weakness of lower extremities. Does report decreased appetite over the past 2 months with being pretty much bedbound over the last few weeks due to his pain. Hospital service was consulted for further evaluation and management of intractable pain from lumbar spinous compression fraction with concerns of malignancy.   Assessment & Plan:   Active Problems:   Atrial fibrillation (HCC)   Prostate cancer (HCC)   Chronic anticoagulation   Pathological fracture   Impaired ambulation   Metastatic cancer to spine [facility])   Palliative care by specialist   Mass of upper lobe of right lung   Lung mass   L2 pathological fracture Patient presenting to ED with intractable pain over the past several weeks to his L-spine. Initial fall in 07/2020. CT chest/abdomen/pelvis with contrast with osseous metastatic disease to the right 3rd rib and L2 vertebral body; small epidural component noted involving the L2 vertebral body which is largely eroded.  Nuclear medicine bone scan 10/22/2020 with uptake at L2 vertebral body, right third rib, right seventh rib concerning for osseous metastasis.  --Medical oncology/palliative care, and IR following --Etiology  thought to be new finding of right upper lobe mass --Continue back brace/abdominal binder as needed --Lidoderm patch --Dilaudid PCA --Casual Tylenol --IR planning on L2 osteocool ablation with kyphoplasty/vertbroplasty w/ Biopsy on 10/24/2020  Right upper lobe mass CT angiogram chest/abdomen/pelvis with multiple pulmonary masses with right upper lobe mass confluence with a rind of soft tissue encasing the right mainstem bronchus resulting in mild narrowing of the mainstem bronchus.  Underwent bronchoscopy by Dr. Valeta Harms on 10/22/2020. --PCCM/medical oncology following, appreciate assistance --Follow-up surgical biopsies  Hypokalemia Potassium 3.3, will replete. --Repeat electrolytes in a.m. to include magnesium  Essential hypertension --Amlodipine 5 mg p.o. daily  Chronic diastolic congestive heart failure, compensated --Furosemide 80 mg p.o. daily  Chronic atrial fibrillation Tachybradycardia syndrome s/p PPM --Holding home Eliquis, on heparin drip    DVT prophylaxis: Heparin drip   Code Status: Full Code Family Communication: Updated patient spouse and daughter-in-law present at bedside this afternoon  Disposition Plan:  Level of care: Med-Surg Status is: Inpatient  Remains inpatient appropriate because:Ongoing active pain requiring inpatient pain management, Ongoing diagnostic testing needed not appropriate for outpatient work up, Unsafe d/c plan, IV treatments appropriate due to intensity of illness or inability to take PO and Inpatient level of care appropriate due to severity of illness   Dispo:  Patient From: Home  Planned Disposition: Home with Health Care Svc  Medically stable for discharge: No       Consultants:   Oncology, Dr. Marin Olp  PCCM, Dr. Valeta Harms  Palliative care  Interventional radiology  Procedures:   Bronchoscopy 10/22/2020, Dr. Valeta Harms  Antimicrobials:   None   Subjective: Patient seen and examined bedside, resting comfortably.  Successful  bronchoscopy  yesterday.  Pending surgical pathology.  Daughter-in-law and spouse present at bedside and updated.  Plan kyphoplasty/vertebroplasty by IR tomorrow.  Patient states pain better controlled, continues on Dilaudid PCA.  No other questions or concerns at this time.  Denies headache, no visual changes, no chest pain, no palpitations, no shortness of breath, no abdominal pain, no fever/chills/night sweats, no nausea/vomiting/diarrhea. No acute events overnight per nursing staff.  Objective: Vitals:   10/23/20 0520 10/23/20 0719 10/23/20 0859 10/23/20 1259  BP: (!) 150/74 (!) 151/98    Pulse: 73 82    Resp: 16 17 16 16   Temp: 98.6 F (37 C) (!) 97.4 F (36.3 C)    TempSrc:  Oral    SpO2: 96% 99% 99% 99%  Weight:      Height:        Intake/Output Summary (Last 24 hours) at 10/23/2020 1358 Last data filed at 10/23/2020 1111 Gross per 24 hour  Intake 620.71 ml  Output 1610 ml  Net -989.29 ml   Filed Weights   10/19/20 1549  Weight: 70.3 kg    Examination:  General exam: Appears calm and comfortable Respiratory system: Clear to auscultation. Respiratory effort normal. Oxygenating well on room air Cardiovascular system: S1 & S2 heard, RRR. No JVD, murmurs, rubs, gallops or clicks. No pedal edema. Gastrointestinal system: Abdomen is nondistended, soft and nontender. No organomegaly or masses felt. Normal bowel sounds heard. Central nervous system: Alert and oriented. No focal neurological deficits. Extremities: Tenderness to palpation midline back about lumbar segments. Skin: No rashes, lesions or ulcers Psychiatry: Judgement and insight appear normal. Mood & affect appropriate.     Data Reviewed: I have personally reviewed following labs and imaging studies  CBC: Recent Labs  Lab 10/19/20 1228 10/20/20 0556 10/21/20 0059 10/22/20 0122 10/23/20 0547  WBC 6.7 3.3* 6.8 6.7 4.6  HGB 12.0* 11.5* 11.6* 10.8* 11.9*  HCT 38.2* 34.5* 36.3* 34.3* 36.3*  MCV 99.0 96.4 95.5  97.7 95.3  PLT 184 185 210 191 854   Basic Metabolic Panel: Recent Labs  Lab 10/19/20 1228 10/20/20 0556 10/21/20 0059 10/22/20 0122 10/23/20 0547  NA 139 138 137 136 138  K 3.0* 4.5 3.8 3.7 3.3*  CL 97* 97* 98 97* 99  CO2 29 28 26 29 27   GLUCOSE 114* 164* 162* 181* 157*  BUN 12 12 19  24* 25*  CREATININE 1.15 1.09 1.23 1.17 1.08  CALCIUM 9.6 9.7 9.4 8.5* 8.1*  MG 1.6*  --   --   --   --    GFR: Estimated Creatinine Clearance: 47.5 mL/min (by C-G formula based on SCr of 1.08 mg/dL). Liver Function Tests: Recent Labs  Lab 10/19/20 1228  AST 23  ALT 11  ALKPHOS 95  BILITOT 1.9*  PROT 6.7  ALBUMIN 3.4*   No results for input(s): LIPASE, AMYLASE in the last 168 hours. No results for input(s): AMMONIA in the last 168 hours. Coagulation Profile: No results for input(s): INR, PROTIME in the last 168 hours. Cardiac Enzymes: No results for input(s): CKTOTAL, CKMB, CKMBINDEX, TROPONINI in the last 168 hours. BNP (last 3 results) Recent Labs    11/10/19 1038 12/08/19 1232 01/30/20 1336  PROBNP 2,698* 2,453* 2,125*   HbA1C: No results for input(s): HGBA1C in the last 72 hours. CBG: No results for input(s): GLUCAP in the last 168 hours. Lipid Profile: No results for input(s): CHOL, HDL, LDLCALC, TRIG, CHOLHDL, LDLDIRECT in the last 72 hours. Thyroid Function Tests: No results for input(s): TSH, T4TOTAL, FREET4,  T3FREE, THYROIDAB in the last 72 hours. Anemia Panel: No results for input(s): VITAMINB12, FOLATE, FERRITIN, TIBC, IRON, RETICCTPCT in the last 72 hours. Sepsis Labs: No results for input(s): PROCALCITON, LATICACIDVEN in the last 168 hours.  Recent Results (from the past 240 hour(s))  SARS CORONAVIRUS 2 (TAT 6-24 HRS) Nasopharyngeal Nasopharyngeal Swab     Status: None   Collection Time: 10/19/20  1:47 PM   Specimen: Nasopharyngeal Swab  Result Value Ref Range Status   SARS Coronavirus 2 NEGATIVE NEGATIVE Final    Comment: (NOTE) SARS-CoV-2 target nucleic  acids are NOT DETECTED.  The SARS-CoV-2 RNA is generally detectable in upper and lower respiratory specimens during the acute phase of infection. Negative results do not preclude SARS-CoV-2 infection, do not rule out co-infections with other pathogens, and should not be used as the sole basis for treatment or other patient management decisions. Negative results must be combined with clinical observations, patient history, and epidemiological information. The expected result is Negative.  Fact Sheet for Patients: SugarRoll.be  Fact Sheet for Healthcare Providers: https://www.woods-mathews.com/  This test is not yet approved or cleared by the [place] FDA and  has been authorized for detection and/or diagnosis of SARS-CoV-2 by FDA under an Emergency Use Authorization (EUA). This EUA will remain  in effect (meaning this test can be used) for the duration of the COVID-19 declaration under Se ction 564(b)(1) of the Act, 21 U.S.C. section 360bbb-3(b)(1), unless the authorization is terminated or revoked sooner.  Performed at [facility], [place] [address], [place], [place] [address]   MRSA PCR Screening     Status: None   Collection Time: 10/22/20  5:54 AM   Specimen: Nasal Mucosa; Nasopharyngeal  Result Value Ref Range Status   MRSA by PCR NEGATIVE NEGATIVE Final    Comment:        The GeneXpert MRSA Assay (FDA approved for NASAL specimens only), is one component of a comprehensive MRSA colonization surveillance program. It is not intended to diagnose MRSA infection nor to guide or monitor treatment for MRSA infections. Performed at [facility], [place] [address] [place], [place] [address]          Radiology Studies: NM Bone Scan Whole Body  Result Date: 10/22/2020 CLINICAL DATA:  Non-small cell lung cancer, staging, L2 fracture EXAM: NUCLEAR MEDICINE WHOLE BODY BONE SCAN TECHNIQUE: Whole body anterior and posterior  images were obtained approximately 3 hours after intravenous injection of radiopharmaceutical. RADIOPHARMACEUTICALS:  21.3 mCi Technetium-45m MDP IV COMPARISON:  05/12/2012 Correlation: CT chest abdomen pelvis 10/20/2020, CT lumbar spine 10/17/2020 FINDINGS: Abnormal tracer uptake at L2 corresponding to metastasis with pathologic fracture on CT. Abnormal tracer uptake at lateral RIGHT seventh rib without accompanying CT finding. Abnormal tracer uptake is seen at the anterior and lateral RIGHT third rib, with the uptake at the lateral RIGHT third rib corresponding to an area of bone destruction on CT series 7, image 53. Uptake at the shoulders, sternoclavicular joints, elbows, wrists, and knees, typically degenerative. No additional worrisome sites of tracer uptake are seen to suggest osseous metastatic disease. Expected urinary tract and soft tissue distribution of tracer. IMPRESSION: Uptake at L2 vertebral body, RIGHT seventh rib, and 2 sites in the RIGHT third rib suspicious for osseous metastases. Uptake at L2 and lateral RIGHT third rib correspond to destructive lesions on CT. No CT findings are seen at the anterior RIGHT third rib or lateral RIGHT seventh rib sites of bone scan abnormality. Electronically Signed   By:  Lavonia Dana M.D.   On: 10/22/2020 16:08   DG CHEST PORT 1 VIEW  Result Date: 10/22/2020 CLINICAL DATA:  Pulmonary masses, status post bronchoscopy with biopsy EXAM: PORTABLE CHEST 1 VIEW COMPARISON:  CT chest 10/20/2020 FINDINGS: Dual lead pacer in place. Prior CABG. Atherosclerotic calcification of the aortic arch. No pneumothorax. Stable appearance of mass in the right upper lobe. Indistinct lingular density compatible with previously seen pleural thickening and scarring. Stable right perihilar fullness likely from adenopathy. No blunting of the right costophrenic angle. Stable mild cardiomegaly. IMPRESSION: 1. No pneumothorax status post bronchoscopy. 2. Stable appearance of the right upper  lobe mass and right perihilar adenopathy. 3. Stable mild cardiomegaly. 4. Lingular pleural thickening and scarring. Electronically Signed   By: Van Clines M.D.   On: 10/22/2020 17:09   DG C-ARM BRONCHOSCOPY  Result Date: 10/22/2020 C-ARM BRONCHOSCOPY: Fluoroscopy was utilized by the requesting physician.  No radiographic interpretation.        Scheduled Meds: . acetaminophen  1,000 mg Oral TID  . amLODipine  5 mg Oral Daily  . brimonidine  1 drop Both Eyes TID  . cholecalciferol  2,000 Units Oral Daily  . dorzolamide  1 drop Both Eyes TID  . feeding supplement  1 Container Oral TID BM  . furosemide  80 mg Oral Daily  . HYDROmorphone   Intravenous Q4H  . latanoprost  1 drop Both Eyes QHS  . lidocaine  1 patch Transdermal Q24H  . magnesium oxide  400 mg Oral BID  . multivitamin with minerals  1 tablet Oral Daily  . pantoprazole  40 mg Oral Daily  . polyethylene glycol  17 g Oral Daily  . potassium chloride SA  40 mEq Oral Daily  . senna  1 tablet Oral BID   Continuous Infusions: . heparin 1,200 Units/hr (10/23/20 1111)     LOS: 4 days    Time spent: 36 minutes spent on chart review, discussion with nursing staff, consultants, updating family and interview/physical exam; more than 50% of that time was spent in counseling and/or coordination of care.    Sydne Krahl J [place] DO [facility] Available via Epic secure chat 7am-7pm After these hours, please refer to coverage provider listed on [email or web address] 10/23/2020, 1:58 PM

## 2020-10-23 NOTE — Progress Notes (Signed)
Patient ID: Jonathan Warner, male   DOB: Jul 31, 1932, 87 y.o.   MRN: [id number]  Medical records reviewed  87 year old gentleman admitted for back pain concern for L2 compression fracture versus metastatic disease.  Large lung mass found on CT scan pulmonary consulted for tissue diagnosis management.  Palliative consulted for GOCs and pain management/back pain 2/2 metastatic disease.  Continued workup in progress  This NP visited patient at the bedside as a follow for palliative medicine needs and emotional support.  Patient is alert and oriented and engaged in conversation.  He verbalizes a clear understanding of his current medical situation, he will met with radiation oncology today for possible radiation treatment. IR consulted,  plan is for kyphoplasy/vertebroplasty;  tentatively set for tomorrow.   Mr Jonathan Warner was emotional as he verbalize his understanding of the seriousness of his disease.  He tells me he "is ready" when his time comes. He mostly speaks to his love for his family  Patient was started on a PCA - 10-21-20 It is important that we begin to transition him off Iv medications, in anticipation of discharge  home.   He has only utilized 0.6 mg of Dilaudid/PCA past 12 hrs.per [facility]  Plan of care - Increase scheduled Tylenol to 1000 mg po TID -continue with PCA until back from procedure tomorrow then re-evaluate for next steps in recommendations for pain managment.   Hopefully kyphoplasty will offer significant pain relief.  Discussed with patient the importance of continued conversation with family and the  medical providers regarding overall plan of care and treatment options,  ensuring decisions are within the context of the patients values and GOCs.  Questions and concerns addressed     Discussed with Dr [place]  Total time spent on the unit was 35 minutes  Greater than 50% of the time was spent in counseling and coordination of care  [name] NP  Palliative Medicine Team  Team Phone # [phone] Pager [phone]

## 2020-10-23 NOTE — Progress Notes (Signed)
OT Cancellation Note  Patient Details Name: HEWITT GARNER MRN: [id number] DOB: 09/24/1931   Cancelled Treatment:    Reason Eval/Treat Not Completed:  (Pt with palliative medicince. Will return.)  [name] 10/23/2020, 11:41 AM  Nestor Lewandowsky, OTR/L Acute Rehabilitation Services Pager: [phone] Office: [phone]

## 2020-10-23 NOTE — Progress Notes (Signed)
ANTICOAGULATION CONSULT NOTE - Follow Up Consult  Pharmacy Consult for IV Heparin Indication: atrial fibrillation  Allergies  Allergen Reactions  . Pilocarpine Other (See Comments)    Made him feel very funny feeling.  . Rosuvastatin Other (See Comments)    Knee and leg pain Knee and leg pain    Patient Measurements: Height: 5\' 8"  (172.7 cm) Weight: 70.3 kg (155 lb) IBW/kg (Calculated) : 68.4  Heparin dosing weight: 70 kg  Vital Signs: Temp: 97.9 F (36.6 C) (03/02 1411) Temp Source: Oral (03/02 1411) BP: 129/69 (03/02 1411) Pulse Rate: 65 (03/02 1411)  Labs: Recent Labs    10/21/20 0059 10/21/20 1006 10/22/20 0122 10/23/20 0547 10/23/20 1536  HGB 11.6*  --  10.8* 11.9*  --   HCT 36.3*  --  34.3* 36.3*  --   PLT 210  --  191 193  --   APTT 84*   < > 102* 43* 94*  HEPARINUNFRC 1.12*   < > 0.99* 0.61 1.02*  CREATININE 1.23  --  1.17 1.08  --    < > = values in this interval not displayed.    Estimated Creatinine Clearance: 47.5 mL/min (by C-G formula based on SCr of 1.08 mg/dL).   Medical History: Past Medical History:  Diagnosis Date  . AICD (automatic cardioverter/defibrillator) present 04/2003   MDT EnPulse implanted by Dr Leonia Reeves ddd; generator change 05/2012  . Arthritis   . BPH (benign prostatic hyperplasia)   . CAD (coronary artery disease)    s/p CABG 1991  . Carotid artery occlusion   . Cataract    b/l surgery  . Chronic kidney disease (CKD), stage III (moderate) (HCC)   . Diverticulosis   . DJD (degenerative joint disease)   . DJD (degenerative joint disease)   . GERD (gastroesophageal reflux disease)   . Glaucoma   . Hiatal hernia   . History of adenomatous polyp of colon 06/07/13   Last colonoscopy 06/27/13 showed small adenoma, no further testing due to advanced age.  [name] History of radiation therapy 07/25/2012-09/20/2012   prostate 7800 cGy 40 sessions, seminal vesicles 5600 cGy 40 sessions  . History of shingles   . Hypercholesteremia    . Hyperlipidemia   . Hypertension   . Inguinal hernia   . Osteomyelitis of forearm, right, acute ([place])    drainage x 2  . Persistent atrial fibrillation ([place])   . Prostate cancer ([place]) 04/27/12   Dr. Eulogio Ditch. Radiation therapy. Adenocarcinoma,gleason=3+4=7, 3+3=6,PSA=4.93  volume=51cc  . Pulmonary nodules    small rlung base nodule 4.47mm, scarring in lung bases  . 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  Pt was on apixaban PTA for Afib, with plan to hold apixaban and bridge with heparin for procedure (last dose of apixaban was at 1500 on 2/26).  PM update - aPTT therapeutic at 94, heparin level still elevated at 1.02. CBC stable, LDH back up to 197. No bleeding or issues with infusion per discussion with RN. She confirms level was drawn appropriately from the opposite arm from where heparin infusing.  Goal of Therapy:  aPTT:  66-102 sec Heparin level:  0.3-0.7 units/ml Monitor platelets by anticoagulation protocol: Yes   Plan:  Continue heparin at 1200 units/hr Check confirmatory  aPTT with AM labs Monitor daily heparin level and aPTT until correlating, CBC, s/sx bleeding   Arturo Morton, PharmD, BCPS Please check AMION for all [place] contact numbers Clinical Pharmacist 10/23/2020 5:09 PM

## 2020-10-23 NOTE — Progress Notes (Signed)
Mr. Voiles had his bronchoscopy yesterday.  Biopsies were obtained.  We will have to wait the results.  The bone scan did show multiple areas of involvement.  There were 3 on the bone scan.  I have yet to see any note from radiology about the possibility of vertebroplasty.  I am not sure if they are even aware of the patient.  We really need to get him to see him to see if they can do a vertebroplasty.  Radiation oncology will not do anything unless a know what will be done with respect to a vertebroplasty.  I am unsure what kind of activity he is doing.  I suppose he has a back brace.  He continues on the heparin infusion.  He has to stay on this until we know if there are any more invasive procedures that are planned.  Not much else that I need to do at this point.  Again we have to await other specialty inputs to see what can be done for his pain.  This is really the focal point of therapy for him.  He has stage IV disease.  This is noncurable.  His quality of life is the goal.  We will just have to await the path report.  Once I get that back, I will send the specimen off for molecular markers.  Lattie Haw, MD  Romans 8:28

## 2020-10-23 NOTE — Plan of Care (Signed)

## 2020-10-23 NOTE — Progress Notes (Signed)
[facility] for Heparin Indication: atrial fibrillation  Allergies  Allergen Reactions  . Pilocarpine Other (See Comments)    Made him feel very funny feeling.  . Rosuvastatin Other (See Comments)    Knee and leg pain Knee and leg pain    Patient Measurements: Height: 5\' 8"  (172.7 cm) Weight: 70.3 kg (155 lb) IBW/kg (Calculated) : 68.4  Heparin dosing weight: 70 kg  Vital Signs: Temp: 97.4 F (36.3 C) (03/02 0719) Temp Source: Oral (03/02 0719) BP: 151/98 (03/02 0719) Pulse Rate: 82 (03/02 0719)  Labs: Recent Labs    10/21/20 0059 10/21/20 1006 10/22/20 0122 10/23/20 0547  HGB 11.6*  --  10.8* 11.9*  HCT 36.3*  --  34.3* 36.3*  PLT 210  --  191 193  APTT 84* 85* 102* 43*  HEPARINUNFRC 1.12* 1.06* 0.99* 0.61  CREATININE 1.23  --  1.17 1.08    Estimated Creatinine Clearance: 47.5 mL/min (by C-G formula based on SCr of 1.08 mg/dL).  Assessment: 87 y.o. male with h/o Afib, Eliquis on hold, for heparin  Goal of Therapy:  aPTT:  66-102 sec Heparin level:  0.3-0.7 units/ml Monitor platelets by anticoagulation protocol: Yes   Plan:  Increase Heparin 1200 units/h APTT in 8 hours  Phillis Knack, PharmD, BCPS

## 2020-10-24 ENCOUNTER — Ambulatory Visit: Payer: Medicare Other | Admitting: Hematology

## 2020-10-24 ENCOUNTER — Inpatient Hospital Stay (HOSPITAL_COMMUNITY): Payer: Medicare Other | Admitting: Physician Assistant

## 2020-10-24 ENCOUNTER — Encounter (HOSPITAL_COMMUNITY)
Admission: EM | Disposition: A | Discharge status: Home Health | Payer: Self-pay | Source: Home / Self Care | Attending: Internal Medicine

## 2020-10-24 ENCOUNTER — Inpatient Hospital Stay (HOSPITAL_COMMUNITY): Payer: Medicare Other

## 2020-10-24 ENCOUNTER — Inpatient Hospital Stay (HOSPITAL_COMMUNITY)
Admission: RE | Admit: 2020-10-24 | Payer: Medicare Other | Source: Home / Self Care | Admitting: Interventional Radiology

## 2020-10-24 ENCOUNTER — Encounter (HOSPITAL_COMMUNITY): Payer: Self-pay | Admitting: Internal Medicine

## 2020-10-24 DIAGNOSIS — C3411 Malignant neoplasm of upper lobe, right bronchus or lung: Secondary | ICD-10-CM | POA: Diagnosis not present

## 2020-10-24 DIAGNOSIS — Z7901 Long term (current) use of anticoagulants: Secondary | ICD-10-CM | POA: Diagnosis not present

## 2020-10-24 DIAGNOSIS — C3491 Malignant neoplasm of unspecified part of right bronchus or lung: Secondary | ICD-10-CM | POA: Diagnosis not present

## 2020-10-24 DIAGNOSIS — C7951 Secondary malignant neoplasm of bone: Secondary | ICD-10-CM | POA: Diagnosis not present

## 2020-10-24 DIAGNOSIS — I480 Paroxysmal atrial fibrillation: Secondary | ICD-10-CM | POA: Diagnosis not present

## 2020-10-24 DIAGNOSIS — C3431 Malignant neoplasm of lower lobe, right bronchus or lung: Secondary | ICD-10-CM | POA: Diagnosis not present

## 2020-10-24 DIAGNOSIS — R262 Difficulty in walking, not elsewhere classified: Secondary | ICD-10-CM | POA: Diagnosis not present

## 2020-10-24 DIAGNOSIS — M8448XA Pathological fracture, other site, initial encounter for fracture: Secondary | ICD-10-CM

## 2020-10-24 DIAGNOSIS — G893 Neoplasm related pain (acute) (chronic): Secondary | ICD-10-CM | POA: Diagnosis not present

## 2020-10-24 HISTORY — PX: IR KYPHO LUMBAR INC FX REDUCE BONE BX UNI/BIL CANNULATION INC/IMAGING: IMG5519

## 2020-10-24 HISTORY — PX: RADIOLOGY WITH ANESTHESIA: SHX6223

## 2020-10-24 HISTORY — PX: IR BONE TUMOR(S)RF ABLATION: IMG2284

## 2020-10-24 LAB — CYTOLOGY - NON PAP

## 2020-10-24 LAB — CBC
HCT: 36.5 % — ABNORMAL LOW (ref 39.0–52.0)
Hemoglobin: 12 g/dL — ABNORMAL LOW (ref 13.0–17.0)
MCH: 31.4 pg (ref 26.0–34.0)
MCHC: 32.9 g/dL (ref 30.0–36.0)
MCV: 95.5 fL (ref 80.0–100.0)
Platelets: 178 10*3/uL (ref 150–400)
RBC: 3.82 MIL/uL — ABNORMAL LOW (ref 4.22–5.81)
RDW: 14 % (ref 11.5–15.5)
WBC: 5.1 10*3/uL (ref 4.0–10.5)
nRBC: 0 % (ref 0.0–0.2)

## 2020-10-24 LAB — BASIC METABOLIC PANEL
Anion gap: 11 (ref 5–15)
BUN: 25 mg/dL — ABNORMAL HIGH (ref 8–23)
CO2: 25 mmol/L (ref 22–32)
Calcium: 8.1 mg/dL — ABNORMAL LOW (ref 8.9–10.3)
Chloride: 101 mmol/L (ref 98–111)
Creatinine, Ser: 1.07 mg/dL (ref 0.61–1.24)
GFR, Estimated: >60 mL/min (ref >60)
Glucose, Bld: 137 mg/dL — ABNORMAL HIGH (ref 70–99)
Potassium: 3.7 mmol/L (ref 3.5–5.1)
Sodium: 137 mmol/L (ref 135–145)

## 2020-10-24 LAB — HEPARIN LEVEL (UNFRACTIONATED): Heparin Unfractionated: 1.1 IU/mL — ABNORMAL HIGH (ref 0.30–0.70)

## 2020-10-24 LAB — LACTATE DEHYDROGENASE: LDH: 190 U/L (ref 98–192)

## 2020-10-24 LAB — APTT: aPTT: 138 seconds — ABNORMAL HIGH (ref 24–36)

## 2020-10-24 LAB — MAGNESIUM: Magnesium: 2.2 mg/dL (ref 1.7–2.4)

## 2020-10-24 SURGERY — MRI WITH ANESTHESIA
Anesthesia: Monitor Anesthesia Care

## 2020-10-24 MED ORDER — PROPOFOL 1000 MG/100ML IV EMUL
INTRAVENOUS | Status: AC
  Filled 2020-10-24: qty 100

## 2020-10-24 MED ORDER — LACTULOSE 10 GM/15ML PO SOLN
20.0000 g | Freq: Every day | ORAL | Status: DC
Start: 2020-10-24 — End: 2020-11-01
  Administered 2020-10-25 – 2020-10-31 (×5): 20 g via ORAL
  Filled 2020-10-24 (×7): qty 30

## 2020-10-24 MED ORDER — FENTANYL CITRATE (PF) 100 MCG/2ML IJ SOLN
25.0000 ug | INTRAMUSCULAR | Status: DC | PRN
  Administered 2020-10-24 (×2): 25 ug via INTRAVENOUS

## 2020-10-24 MED ORDER — POLYETHYLENE GLYCOL 3350 17 G PO PACK
17.0000 g | PACK | Freq: Two times a day (BID) | ORAL | Status: DC
Start: 2020-10-24 — End: 2020-11-01
  Administered 2020-10-24 – 2020-10-31 (×10): 17 g via ORAL
  Filled 2020-10-24 (×12): qty 1

## 2020-10-24 MED ORDER — FENTANYL CITRATE (PF) 250 MCG/5ML IJ SOLN
INTRAMUSCULAR | Status: AC
  Filled 2020-10-24: qty 5

## 2020-10-24 MED ORDER — OXYCODONE HCL 5 MG/5ML PO SOLN: 5.0000 mg | Freq: Once | ORAL | Status: DC | PRN

## 2020-10-24 MED ORDER — ACETAMINOPHEN 160 MG/5ML PO SOLN: 1000.0000 mg | Freq: Once | ORAL | Status: DC | PRN

## 2020-10-24 MED ORDER — IOHEXOL 300 MG/ML  SOLN: 50.0000 mL | Freq: Once | INTRAMUSCULAR | Status: DC | PRN

## 2020-10-24 MED ORDER — SODIUM CHLORIDE 0.9 % IV SOLN: INTRAVENOUS | Status: AC

## 2020-10-24 MED ORDER — CEFAZOLIN SODIUM-DEXTROSE 2-4 GM/100ML-% IV SOLN
INTRAVENOUS | Status: AC
  Filled 2020-10-24: qty 100

## 2020-10-24 MED ORDER — ACETAMINOPHEN 500 MG PO TABS: 1000.0000 mg | ORAL_TABLET | Freq: Once | ORAL | Status: DC | PRN

## 2020-10-24 MED ORDER — FENTANYL CITRATE (PF) 100 MCG/2ML IJ SOLN
INTRAMUSCULAR | Status: AC
  Filled 2020-10-24: qty 2

## 2020-10-24 MED ORDER — CEFAZOLIN SODIUM-DEXTROSE 2-3 GM-%(50ML) IV SOLR
INTRAVENOUS | Status: DC | PRN
  Administered 2020-10-24: 2 g via INTRAVENOUS

## 2020-10-24 MED ORDER — SENNA 8.6 MG PO TABS
2.0000 | ORAL_TABLET | Freq: Two times a day (BID) | ORAL | Status: DC
  Administered 2020-10-24 – 2020-10-31 (×11): 17.2 mg via ORAL
  Filled 2020-10-24 (×13): qty 2

## 2020-10-24 MED ORDER — ONDANSETRON HCL 4 MG/2ML IJ SOLN
INTRAMUSCULAR | Status: DC | PRN
  Administered 2020-10-24: 4 mg via INTRAVENOUS

## 2020-10-24 MED ORDER — LACTATED RINGERS IV SOLN: INTRAVENOUS | Status: DC

## 2020-10-24 MED ORDER — OXYCODONE HCL 5 MG PO TABS: 5.0000 mg | ORAL_TABLET | Freq: Once | ORAL | Status: DC | PRN

## 2020-10-24 MED ORDER — PROPOFOL 500 MG/50ML IV EMUL
INTRAVENOUS | Status: DC | PRN
  Administered 2020-10-24: 100 ug/kg/min via INTRAVENOUS

## 2020-10-24 MED ORDER — HEPARIN (PORCINE) 25000 UT/250ML-% IV SOLN
1100.0000 [IU]/h | INTRAVENOUS | Status: DC
  Filled 2020-10-24: qty 250

## 2020-10-24 MED ORDER — BUPIVACAINE HCL 0.25 % IJ SOLN
INTRAMUSCULAR | Status: AC | PRN
  Administered 2020-10-24: 20 mL

## 2020-10-24 MED ORDER — FENTANYL CITRATE (PF) 250 MCG/5ML IJ SOLN
INTRAMUSCULAR | Status: DC | PRN
  Administered 2020-10-24: 50 ug via INTRAVENOUS

## 2020-10-24 MED ORDER — CHLORHEXIDINE GLUCONATE 0.12 % MT SOLN
15.0000 mL | Freq: Once | OROMUCOSAL | Status: AC
  Administered 2020-10-24: 15 mL via OROMUCOSAL
  Filled 2020-10-24: qty 15

## 2020-10-24 MED ORDER — PROPOFOL 10 MG/ML IV BOLUS: INTRAVENOUS | Status: DC | PRN

## 2020-10-24 MED ORDER — ACETAMINOPHEN 10 MG/ML IV SOLN: 1000.0000 mg | Freq: Once | INTRAVENOUS | Status: DC | PRN

## 2020-10-24 NOTE — Progress Notes (Signed)
We now have a diagnosis.  The pathology shows squamous cell carcinoma.  I am little bit surprised by this is because he does not smoke for quite a while.  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  He is supposed to undergo a vertebroplasty today.  Hopefully he will have this.  Goal clearly is to treat him so that he is more functional.  As such, our treatment needs to be focused on his L2 vertebral body.  Hopefully, vertebroplasty will be able to strengthen the vertebral body and then radiation therapy will be able to help decrease the tumor volume in that area.  I will send the bronc specimen off for molecular analysis.  This is not smoked for quite a while, we may find a genetic mutation that we might be able to utilize for treatment with a targeted therapy, which is a pill.  Again, the pain control is our primary goal right now.  He is on a PCA pump.  I just wonder if he would not be a good candidate for a fentanyl patch.  I talked to him this morning about everything.  I told him that he had stage IV disease.  I told him that the cancer was treatable but not curable.  Again, we have to keep He is 87 years old and we have to keep him on his quality of life.  He is not eating much.  He is not having bowel movements.  Maybe we can have him go to the bathroom and then he can eat a little bit better.  His nutritional state is good to be critical as far as his prognosis is concerned.  He currently is on the heparin infusion.  Hopefully, once the vertebroplasty is done, he will be switched back over to oral anticoagulation therapy.  I am not sure when radiation would start after the vertebroplasty.  I will have to see what radiation oncology says.  If he is having continued difficulty with ambulation, he may need inpatient radiation therapy.  I know that he is getting outstanding care from all the staff on [address].  I appreciate all their efforts.  Lattie Haw, MD  1  Corinthians 1:9

## 2020-10-24 NOTE — Progress Notes (Signed)
Phone call to pt RN ob floor. Confirmed with RN order for heparin drip to be turned off at 0730 for procedure later this morning. RN acknowledged.

## 2020-10-24 NOTE — Progress Notes (Signed)
ANTICOAGULATION CONSULT NOTE - Follow Up Consult  Pharmacy Consult for heparin Indication: atrial fibrillation  Labs: Recent Labs    10/22/20 0122 10/23/20 0547 10/23/20 1536 10/24/20 0337  HGB 10.8* 11.9*  --  12.0*  HCT 34.3* 36.3*  --  36.5*  PLT 191 193  --  178  APTT 102* 43* 94* 138*  HEPARINUNFRC 0.99* 0.61 1.02* 1.10*  CREATININE 1.17 1.08  --   --     Assessment: 87yo male supratherapeutic on heparin after one PTT at goal; no gtt issues or signs of bleeding per RN.  Goal of Therapy:  aPTT 66-102 seconds   Plan:  Will decrease heparin gtt by 1-2 units/kg/hr to 1100 units/hr and check PTT in 8 hours.    Rogue Bussing 10/24/2020,6:07 AM

## 2020-10-24 NOTE — Progress Notes (Addendum)
ANTICOAGULATION CONSULT NOTE - Follow Up Consult  Pharmacy Consult for IV Heparin Indication: atrial fibrillation  Allergies  Allergen Reactions  . Pilocarpine Other (See Comments)    Made him feel very funny feeling.  . Rosuvastatin Other (See Comments)    Knee and leg pain Knee and leg pain    Patient Measurements: Height: 5' 7.99" (172.7 cm) Weight: 70.3 kg (155 lb) IBW/kg (Calculated) : 68.38  Heparin dosing weight: 70 kg  Vital Signs: Temp: 98 F (36.7 C) (03/03 1434) Temp Source: Oral (03/03 1434) BP: 135/65 (03/03 1434) Pulse Rate: 65 (03/03 1434)  Labs: Recent Labs    10/22/20 0122 10/23/20 0547 10/23/20 1536 10/24/20 0337  HGB 10.8* 11.9*  --  12.0*  HCT 34.3* 36.3*  --  36.5*  PLT 191 193  --  178  APTT 102* 43* 94* 138*  HEPARINUNFRC 0.99* 0.61 1.02* 1.10*  CREATININE 1.17 1.08  --  1.07    Estimated Creatinine Clearance: 47.9 mL/min (by C-G formula based on SCr of 1.07 mg/dL).   Medical History: Past Medical History:  Diagnosis Date  . AICD (automatic cardioverter/defibrillator) present 04/2003   MDT EnPulse implanted by Dr Leonia Reeves ddd; generator change 05/2012  . Arthritis   . BPH (benign prostatic hyperplasia)   . CAD (coronary artery disease)    s/p CABG 1991  . Carotid artery occlusion   . Cataract    b/l surgery  . Chronic kidney disease (CKD), stage III (moderate) (HCC)   . Diverticulosis   . DJD (degenerative joint disease)   . DJD (degenerative joint disease)   . GERD (gastroesophageal reflux disease)   . Glaucoma   . Hiatal hernia   . History of adenomatous polyp of colon 06/07/13   Last colonoscopy 06/27/13 showed small adenoma, no further testing due to advanced age.  [name] History of radiation therapy 07/25/2012-09/20/2012   prostate 7800 cGy 40 sessions, seminal vesicles 5600 cGy 40 sessions  . History of shingles   . Hypercholesteremia   . Hyperlipidemia   . Hypertension   . Inguinal hernia   . Osteomyelitis of forearm,  right, acute ([place])    drainage x 2  . Persistent atrial fibrillation ([place])   . Prostate cancer ([place]) 04/27/12   Dr. Eulogio Ditch. Radiation therapy. Adenocarcinoma,gleason=3+4=7, 3+3=6,PSA=4.93  volume=51cc  . Pulmonary nodules    small rlung base nodule 4.69mm, scarring in lung bases  . 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  Pt was on apixaban PTA for Afib, with plan to hold apixaban and bridge with heparin for procedure (last dose of apixaban was at 1500 on 2/26).  Heparin held for IR procedure this morning. Per MD continue to hold tonight and will likely restart apixaban tomorrow.   Goal of Therapy:  Heparin level:  0.3-0.7 units/ml Monitor platelets by anticoagulation protocol: Yes   Plan:  Hold heparin F/u restart apixaban  Benetta Spar, PharmD, BCPS, BCCP Clinical Pharmacist  Please check AMION for all [place] phone numbers After 10:00 PM, call [place] [phone]

## 2020-10-24 NOTE — Progress Notes (Signed)
03/03 0618 An order was put in by Charlane Ferretti to change Heparin rate from 32ml/hr to 73mL/hr.

## 2020-10-24 NOTE — Plan of Care (Signed)

## 2020-10-24 NOTE — Transfer of Care (Signed)
Immediate Anesthesia Transfer of Care Note  Patient: Jonathan Warner  Procedure(s) Performed: L2 KYPHOPLASTY (N/A )  Patient Location: PACU  Anesthesia Type:General  Level of Consciousness: drowsy  Airway & Oxygen Therapy: Patient Spontanous Breathing and Patient connected to face mask oxygen  Post-op Assessment: Report given to RN and Post -op Vital signs reviewed and stable  Post vital signs: Reviewed and stable  Last Vitals:  Vitals Value Taken Time  BP 145/92 10/24/20 1314  Temp    Pulse 78 10/24/20 1315  Resp 19 10/24/20 1315  SpO2 97 % 10/24/20 1315  Vitals shown include unvalidated device data.  Last Pain:  Vitals:   10/24/20 0800  TempSrc:   PainSc: 8       Patients Stated Pain Goal: 0 (37/00/52 5910)  Complications: No complications documented.

## 2020-10-24 NOTE — Progress Notes (Signed)
OT Cancellation Note  Patient Details Name: Jonathan Warner MRN: [id number] DOB: [birth year]   Cancelled Treatment:    Reason Eval/Treat Not Completed: Patient at procedure or test/ unavailable Pt in surgery--vertebroplasty.  [name] 10/24/2020, 10:38 AM  Nestor Lewandowsky, OTR/L Acute Rehabilitation Services Pager: [phone] Office: [phone]

## 2020-10-24 NOTE — Anesthesia Preprocedure Evaluation (Signed)
Anesthesia Evaluation  Patient identified by MRN, date of birth, ID band Patient awake    Reviewed: Allergy & Precautions, NPO status , Patient's Chart, lab work & pertinent test results  History of Anesthesia Complications Negative for: history of anesthetic complications  Airway Mallampati: II  TM Distance: >3 FB Neck ROM: Full    Dental  (+) Teeth Intact, Missing,    Pulmonary shortness of breath, former smoker,  CT angiogram chest/abdomen/pelvis with multiple pulmonary masses with right upper lobe mass confluence with a rind of soft tissue encasing the right mainstem bronchus resulting in mild narrowing of the mainstem bronchus.    + decreased breath sounds      Cardiovascular hypertension, Pt. on medications + angina + CAD and + CABG  + dysrhythmias + pacemaker  Rhythm:Irregular  1. Left ventricular ejection fraction, by estimation, is 50 to 55%. The  left ventricle has low normal function. The left ventricle has no regional  wall motion abnormalities. There is severe asymmetric left ventricular  hypertrophy of the basal-septal  segment. Left ventricular diastolic parameters are indeterminate.  2. Right ventricular systolic function is normal. The right ventricular  size is normal.  3. Left atrial size was moderately dilated.  4. The mitral valve is abnormal. Mild mitral valve regurgitation. No  evidence of mitral stenosis.  5. The aortic valve is normal in structure. Aortic valve regurgitation is  trivial. No aortic stenosis is present.   chronic atrial fibrillation on Eliquis   Neuro/Psych L2 compression fracture    GI/Hepatic hiatal hernia, GERD  Medicated and Controlled,Lab Results      Component                Value               Date                      ALT                      11                  10/19/2020                AST                      23                  10/19/2020                ALKPHOS                   95                  10/19/2020                BILITOT                  1.9 (H)             10/19/2020              Endo/Other  negative endocrine ROS  Renal/GU Renal diseaseLab Results      Component                Value               Date  CREATININE               1.07                10/24/2020                Musculoskeletal  (+) Arthritis ,   Abdominal   Peds  Hematology  (+) Blood dyscrasia, anemia , Lab Results      Component                Value               Date                      WBC                      5.1                 10/24/2020                HGB                      12.0 (L)            10/24/2020                HCT                      36.5 (L)            10/24/2020                MCV                      95.5                10/24/2020                PLT                      178                 10/24/2020                Anesthesia Other Findings  Lexiscan stress is electrically nondiagnostic due to pacing  Myovue scan shows large fixed defect of moderate severity in the anterior (base/mid), anteroseptal (base), anterolateral (base, mid) and inferolateral(base/mid) consistent with with scar, cannot exclude some soft tissue attenuation (diaphragm); no ischemia.  LVEF on gating calculated at 48%. Consider echo to confirm LV size/wall motion/overall function.  Intermediate risk study  Medtronic vvir RA/RV lead 25% pacing 60bpm  Reproductive/Obstetrics                             Anesthesia Physical Anesthesia Plan  ASA: IV  Anesthesia Plan: MAC   Post-op Pain Management:    Induction: Intravenous  PONV Risk Score and Plan: 1 and Propofol infusion and Treatment may vary due to age or medical condition  Airway Management Planned: Nasal Cannula  Additional Equipment: None  Intra-op Plan:   Post-operative Plan:   Informed Consent: I have reviewed the patients History and Physical, chart,  labs and discussed the procedure including the risks, benefits and alternatives for the proposed anesthesia with the patient or authorized representative who has indicated his/her understanding and acceptance.     Dental  advisory given  Plan Discussed with: CRNA and Surgeon  Anesthesia Plan Comments:         Anesthesia Quick Evaluation

## 2020-10-24 NOTE — H&P (Signed)
Chief Complaint: Patient was seen today for pathologic L2 compression fx  Supervising Physician: Luanne Bras  Patient Status: [facility] - In-pt  Subjective: Pt on schedule for image guided biopsy of L2 mass with pathologic fracture, followed by ablation and vertebroplasty. Wife at bedside. Pt has been NPO this am Feels well, no new complaints  Objective: Physical Exam: BP 139/80 (BP Location: Right Arm)   Pulse 65   Temp 97.7 F (36.5 C) (Oral)   Resp 20   Ht 5\' 8"  (1.727 m)   Wt 70.3 kg   SpO2 98%   BMI 23.57 kg/m  A&O x 3 Heart: Regular Lungs: CTA   Current Facility-Administered Medications:  .  acetaminophen (TYLENOL) tablet 1,000 mg, 1,000 mg, Oral, TID, Knox Royalty, NP, 1,000 mg at 10/23/20 2134 .  amLODipine (NORVASC) tablet 5 mg, 5 mg, Oral, Daily, Wynetta Fines T, MD, 5 mg at 10/23/20 1020 .  brimonidine (ALPHAGAN) 0.2 % ophthalmic solution 1 drop, 1 drop, Both Eyes, TID, Wynetta Fines T, MD, 1 drop at 10/23/20 2136 .  cholecalciferol (VITAMIN D3) tablet 2,000 Units, 2,000 Units, Oral, Daily, Wynetta Fines T, MD, 2,000 Units at 10/23/20 1020 .  dorzolamide (TRUSOPT) 2 % ophthalmic solution 1 drop, 1 drop, Both Eyes, TID, Wynetta Fines T, MD, 1 drop at 10/23/20 2137 .  feeding supplement (BOOST / RESOURCE BREEZE) liquid 1 Container, 1 Container, Oral, TID BM, [place] Donnamarie Poag, DO, 1 Container at 10/23/20 2030 .  furosemide (LASIX) tablet 80 mg, 80 mg, Oral, Daily, Wynetta Fines T, MD, 80 mg at 10/23/20 1020 .  HYDROmorphone (DILAUDID) 1 mg/mL PCA injection, , Intravenous, Q4H, Ferolito, Freada Bergeron, NP, 30 mg at 10/21/20 1307 .  lactulose (CHRONULAC) 10 GM/15ML solution 20 g, 20 g, Oral, Daily, Ennever, Peter R, MD .  latanoprost (XALATAN) 0.005 % ophthalmic solution 1 drop, 1 drop, Both Eyes, QHS, Lequita Halt, MD, 1 drop at 10/23/20 2139 .  lidocaine (LIDODERM) 5 % 1 patch, 1 patch, Transdermal, Q24H, Ferolito, Freada Bergeron, NP, 1 patch at 10/23/20 1259 .  magnesium  oxide (MAG-OX) tablet 400 mg, 400 mg, Oral, BID, Barb Merino, MD, 400 mg at 10/23/20 2134 .  multivitamin with minerals tablet 1 tablet, 1 tablet, Oral, Daily, [place] Donnamarie Poag, DO, 1 tablet at 10/23/20 1020 .  naloxone [facility]) injection 0.4 mg, 0.4 mg, Intravenous, PRN **AND** sodium chloride flush (NS) 0.9 % injection 9 mL, 9 mL, Intravenous, PRN, Ferolito, Freada Bergeron, NP .  ondansetron [facility]) injection 4 mg, 4 mg, Intravenous, Q6H PRN, Wynetta Fines T, MD, 4 mg at 10/19/20 1738 .  pantoprazole (PROTONIX) EC tablet 40 mg, 40 mg, Oral, Daily, Wynetta Fines T, MD, 40 mg at 10/23/20 1020 .  polyethylene glycol (MIRALAX / GLYCOLAX) packet 17 g, 17 g, Oral, BID, Ennever, Peter R, MD .  potassium chloride SA (KLOR-CON) CR tablet 40 mEq, 40 mEq, Oral, Daily, Wynetta Fines T, MD, 40 mEq at 10/23/20 1021 .  senna (SENOKOT) tablet 17.2 mg, 2 tablet, Oral, BID, [place] Eric J, DO  Labs: CBC Recent Labs    10/23/20 0547 10/24/20 0337  WBC 4.6 5.1  HGB 11.9* 12.0*  HCT 36.3* 36.5*  PLT 193 178   BMET Recent Labs    10/23/20 0547 10/24/20 0337  NA 138 137  K 3.3* 3.7  CL 99 101  CO2 27 25  GLUCOSE 157* 137*  BUN 25* 25*  CREATININE 1.08 1.07  CALCIUM 8.1* 8.1*   LFT No  results for input(s): PROT, ALBUMIN, AST, ALT, ALKPHOS, BILITOT, BILIDIR, IBILI, LIPASE in the last 72 hours. PT/INR No results for input(s): LABPROT, INR in the last 72 hours.   Studies/Results: NM Bone Scan Whole Body  Result Date: 10/22/2020 CLINICAL DATA:  Non-small cell lung cancer, staging, L2 fracture EXAM: NUCLEAR MEDICINE WHOLE BODY BONE SCAN TECHNIQUE: Whole body anterior and posterior images were obtained approximately 3 hours after intravenous injection of radiopharmaceutical. RADIOPHARMACEUTICALS:  21.3 mCi Technetium-76m MDP IV COMPARISON:  05/12/2012 Correlation: CT chest abdomen pelvis 10/20/2020, CT lumbar spine 10/17/2020 FINDINGS: Abnormal tracer uptake at L2 corresponding to metastasis with pathologic  fracture on CT. Abnormal tracer uptake at lateral RIGHT seventh rib without accompanying CT finding. Abnormal tracer uptake is seen at the anterior and lateral RIGHT third rib, with the uptake at the lateral RIGHT third rib corresponding to an area of bone destruction on CT series 7, image 53. Uptake at the shoulders, sternoclavicular joints, elbows, wrists, and knees, typically degenerative. No additional worrisome sites of tracer uptake are seen to suggest osseous metastatic disease. Expected urinary tract and soft tissue distribution of tracer. IMPRESSION: Uptake at L2 vertebral body, RIGHT seventh rib, and 2 sites in the RIGHT third rib suspicious for osseous metastases. Uptake at L2 and lateral RIGHT third rib correspond to destructive lesions on CT. No CT findings are seen at the anterior RIGHT third rib or lateral RIGHT seventh rib sites of bone scan abnormality. Electronically Signed   By: Lavonia Dana M.D.   On: 10/22/2020 16:08   DG CHEST PORT 1 VIEW  Result Date: 10/22/2020 CLINICAL DATA:  Pulmonary masses, status post bronchoscopy with biopsy EXAM: PORTABLE CHEST 1 VIEW COMPARISON:  CT chest 10/20/2020 FINDINGS: Dual lead pacer in place. Prior CABG. Atherosclerotic calcification of the aortic arch. No pneumothorax. Stable appearance of mass in the right upper lobe. Indistinct lingular density compatible with previously seen pleural thickening and scarring. Stable right perihilar fullness likely from adenopathy. No blunting of the right costophrenic angle. Stable mild cardiomegaly. IMPRESSION: 1. No pneumothorax status post bronchoscopy. 2. Stable appearance of the right upper lobe mass and right perihilar adenopathy. 3. Stable mild cardiomegaly. 4. Lingular pleural thickening and scarring. Electronically Signed   By: Van Clines M.D.   On: 10/22/2020 17:09   DG C-ARM BRONCHOSCOPY  Result Date: 10/22/2020 C-ARM BRONCHOSCOPY: Fluoroscopy was utilized by the requesting physician.  No  radiographic interpretation.    Assessment/Plan: Symptomatic L2 pathologic compression fracture. Procedure including risks and benefits reviewed with pt and wife. No new questions. Ready to proceed with ablation and kyphoplasty/vertebroplasty and possible bone biopsy.   LOS: 5 days    [name] PA-C 10/24/2020 9:53 AM

## 2020-10-24 NOTE — Progress Notes (Signed)
PROGRESS NOTE    Jonathan Warner  NKN:[id number] DOB: 1931/12/13 DOA: 10/19/2020 PCP: Josetta Huddle, MD    Brief Narrative:  Donna C Cottone is an 87 year old male with past medical history significant for prostate cancer status post , CAD s/p CABG, chronic atrial fibrillation on Eliquis, tachybradycardia syndrome s/p PPM, chronic diastolic congestive heart failure, with recent diagnosis of metastatic L2 compression fracture who was brought to the hospital with severe pain and difficulty ambulating. Patient initially reported fell around Christmas time and has been having pain since. He is follows with orthopedics outpatient Dr. Ninfa Linden and was using a lumbar brace without much help. Patient denies any urinary problems or difficulty moving his bowels. No numbness or weakness of lower extremities. Does report decreased appetite over the past 2 months with being pretty much bedbound over the last few weeks due to his pain. Hospital service was consulted for further evaluation and management of intractable pain from lumbar spinous compression fraction with concerns of malignancy.   Assessment & Plan:   Active Problems:   Atrial fibrillation (HCC)   Prostate cancer (HCC)   Chronic anticoagulation   Pathological fracture   Impaired ambulation   Metastatic cancer to spine [facility])   Palliative care by specialist   Mass of upper lobe of right lung   Lung mass   L2 pathological fracture Patient presenting to ED with intractable pain over the past several weeks to his L-spine. Initial fall in 07/2020. CT chest/abdomen/pelvis with contrast with osseous metastatic disease to the right 3rd rib and L2 vertebral body; small epidural component noted involving the L2 vertebral body which is largely eroded.  Nuclear medicine bone scan 10/22/2020 with uptake at L2 vertebral body, right third rib, right seventh rib concerning for osseous metastasis.  --Medical oncology/palliative care, and IR following --Etiology  thought to be new finding of right upper lobe mass --Continue back brace/abdominal binder as needed --Lidoderm patch --Dilaudid PCA; with plan to transition to oral meds on 3/4 --Scheduled Tylenol 1g TID --IR planning on L2 osteocool ablation with kyphoplasty/vertbroplasty w/ Biopsy today  Squamous cell carcinoma lung CT angiogram chest/abdomen/pelvis with multiple pulmonary masses with right upper lobe mass confluence with a rind of soft tissue encasing the right mainstem bronchus resulting in mild narrowing of the mainstem bronchus.  Underwent bronchoscopy by Dr. Valeta Harms on 10/22/2020. --PCCM/medical oncology following, appreciate assistance --Follow-up surgical biopsies for molecular studies  Hypokalemia Potassium 3.7 today --Continue to monitor electrolytes daily  Essential hypertension --Amlodipine 5 mg p.o. daily  Chronic diastolic congestive heart failure, compensated --Furosemide 80 mg p.o. daily  Chronic atrial fibrillation Tachybradycardia syndrome s/p PPM --Holding home Eliquis, on heparin drip    DVT prophylaxis: Heparin drip   Code Status: Full Code Family Communication: Updated patient spouse and son present at bedside this morning  Disposition Plan:  Level of care: Med-Surg Status is: Inpatient  Remains inpatient appropriate because:Ongoing active pain requiring inpatient pain management, Ongoing diagnostic testing needed not appropriate for outpatient work up, Unsafe d/c plan, IV treatments appropriate due to intensity of illness or inability to take PO and Inpatient level of care appropriate due to severity of illness   Dispo:  Patient From: Home  Planned Disposition: Home with Health Care Svc  Medically stable for discharge: No       Consultants:   Oncology, Dr. Marin Olp  PCCM, Dr. Valeta Harms  Palliative care  Interventional radiology  Procedures:   Bronchoscopy 10/22/2020, Dr. Valeta Harms  Antimicrobials:   None   Subjective: Patient  seen and examined  bedside, resting comfortably.  Patient spouse and son present.  Palliative care team also present.  Pending kyphoplasty/vertebroplasty later this afternoon.  Has been utilizing the Dilaudid PCA very minimally.  Pain controlled at rest, but son/spouse report "grimacing" with any type of movement.  Discussed with palliative care; plan to keep Dilaudid PCA until tomorrow with transition to oral meds at that time.  No other questions or concerns at this time.  Denies headache, no visual changes, no chest pain, no palpitations, no shortness of breath, no abdominal pain, no fever/chills/night sweats, no nausea/vomiting/diarrhea. No acute events overnight per nursing staff.  Objective: Vitals:   10/24/20 0528 10/24/20 0800 10/24/20 1057 10/24/20 1315  BP: 139/80   (!) 145/92  Pulse: 65   74  Resp: 20 20  18   Temp: 97.7 F (36.5 C)   (!) 97 F (36.1 C)  TempSrc: Oral     SpO2: 98% 98%  100%  Weight:   70.3 kg   Height:   5' 7.99" (1.727 m)     Intake/Output Summary (Last 24 hours) at 10/24/2020 1339 Last data filed at 10/24/2020 1305 Gross per 24 hour  Intake 890 ml  Output 205 ml  Net 685 ml   Filed Weights   10/19/20 1549 10/24/20 1057  Weight: 70.3 kg 70.3 kg    Examination:  General exam: Appears calm and comfortable Respiratory system: Clear to auscultation. Respiratory effort normal. Oxygenating well on room air Cardiovascular system: S1 & S2 heard, RRR. No JVD, murmurs, rubs, gallops or clicks. No pedal edema. Gastrointestinal system: Abdomen is nondistended, soft and nontender. No organomegaly or masses felt. Normal bowel sounds heard. Central nervous system: Alert and oriented. No focal neurological deficits. Extremities: Tenderness to palpation midline back about lumbar segments. Skin: No rashes, lesions or ulcers Psychiatry: Judgement and insight appear normal. Mood & affect appropriate.     Data Reviewed: I have personally reviewed following labs and imaging  studies  CBC: Recent Labs  Lab 10/20/20 0556 10/21/20 0059 10/22/20 0122 10/23/20 0547 10/24/20 0337  WBC 3.3* 6.8 6.7 4.6 5.1  HGB 11.5* 11.6* 10.8* 11.9* 12.0*  HCT 34.5* 36.3* 34.3* 36.3* 36.5*  MCV 96.4 95.5 97.7 95.3 95.5  PLT 185 210 191 193 620   Basic Metabolic Panel: Recent Labs  Lab 10/19/20 1228 10/20/20 0556 10/21/20 0059 10/22/20 0122 10/23/20 0547 10/24/20 0337  NA 139 138 137 136 138 137  K 3.0* 4.5 3.8 3.7 3.3* 3.7  CL 97* 97* 98 97* 99 101  CO2 29 28 26 29 27 25   GLUCOSE 114* 164* 162* 181* 157* 137*  BUN 12 12 19  24* 25* 25*  CREATININE 1.15 1.09 1.23 1.17 1.08 1.07  CALCIUM 9.6 9.7 9.4 8.5* 8.1* 8.1*  MG 1.6*  --   --   --   --  2.2   GFR: Estimated Creatinine Clearance: 47.9 mL/min (by C-G formula based on SCr of 1.07 mg/dL). Liver Function Tests: Recent Labs  Lab 10/19/20 1228  AST 23  ALT 11  ALKPHOS 95  BILITOT 1.9*  PROT 6.7  ALBUMIN 3.4*   No results for input(s): LIPASE, AMYLASE in the last 168 hours. No results for input(s): AMMONIA in the last 168 hours. Coagulation Profile: No results for input(s): INR, PROTIME in the last 168 hours. Cardiac Enzymes: No results for input(s): CKTOTAL, CKMB, CKMBINDEX, TROPONINI in the last 168 hours. BNP (last 3 results) Recent Labs    11/10/19 1038 12/08/19 1232 01/30/20 1336  PROBNP 2,698* 2,453* 2,125*   HbA1C: No results for input(s): HGBA1C in the last 72 hours. CBG: No results for input(s): GLUCAP in the last 168 hours. Lipid Profile: No results for input(s): CHOL, HDL, LDLCALC, TRIG, CHOLHDL, LDLDIRECT in the last 72 hours. Thyroid Function Tests: No results for input(s): TSH, T4TOTAL, FREET4, T3FREE, THYROIDAB in the last 72 hours. Anemia Panel: No results for input(s): VITAMINB12, FOLATE, FERRITIN, TIBC, IRON, RETICCTPCT in the last 72 hours. Sepsis Labs: No results for input(s): PROCALCITON, LATICACIDVEN in the last 168 hours.  Recent Results (from the past 240 hour(s))   SARS CORONAVIRUS 2 (TAT 6-24 HRS) Nasopharyngeal Nasopharyngeal Swab     Status: None   Collection Time: 10/19/20  1:47 PM   Specimen: Nasopharyngeal Swab  Result Value Ref Range Status   SARS Coronavirus 2 NEGATIVE NEGATIVE Final    Comment: (NOTE) SARS-CoV-2 target nucleic acids are NOT DETECTED.  The SARS-CoV-2 RNA is generally detectable in upper and lower respiratory specimens during the acute phase of infection. Negative results do not preclude SARS-CoV-2 infection, do not rule out co-infections with other pathogens, and should not be used as the sole basis for treatment or other patient management decisions. Negative results must be combined with clinical observations, patient history, and epidemiological information. The expected result is Negative.  Fact Sheet for Patients: SugarRoll.be  Fact Sheet for Healthcare Providers: https://www.woods-mathews.com/  This test is not yet approved or cleared by the [place] FDA and  has been authorized for detection and/or diagnosis of SARS-CoV-2 by FDA under an Emergency Use Authorization (EUA). This EUA will remain  in effect (meaning this test can be used) for the duration of the COVID-19 declaration under Se ction 564(b)(1) of the Act, 21 U.S.C. section 360bbb-3(b)(1), unless the authorization is terminated or revoked sooner.  Performed at [facility], [place] [address], [place], [place] [address]   MRSA PCR Screening     Status: None   Collection Time: 10/22/20  5:54 AM   Specimen: Nasal Mucosa; Nasopharyngeal  Result Value Ref Range Status   MRSA by PCR NEGATIVE NEGATIVE Final    Comment:        The GeneXpert MRSA Assay (FDA approved for NASAL specimens only), is one component of a comprehensive MRSA colonization surveillance program. It is not intended to diagnose MRSA infection nor to guide or monitor treatment for MRSA infections. Performed at [place], [place] [address], [place], [place] [address]          Radiology Studies: NM Bone Scan Whole Body  Result Date: 10/22/2020 CLINICAL DATA:  Non-small cell lung cancer, staging, L2 fracture EXAM: NUCLEAR MEDICINE WHOLE BODY BONE SCAN TECHNIQUE: Whole body anterior and posterior images were obtained approximately 3 hours after intravenous injection of radiopharmaceutical. RADIOPHARMACEUTICALS:  21.3 mCi Technetium-59m MDP IV COMPARISON:  05/12/2012 Correlation: CT chest abdomen pelvis 10/20/2020, CT lumbar spine 10/17/2020 FINDINGS: Abnormal tracer uptake at L2 corresponding to metastasis with pathologic fracture on CT. Abnormal tracer uptake at lateral RIGHT seventh rib without accompanying CT finding. Abnormal tracer uptake is seen at the anterior and lateral RIGHT third rib, with the uptake at the lateral RIGHT third rib corresponding to an area of bone destruction on CT series 7, image 53. Uptake at the shoulders, sternoclavicular joints, elbows, wrists, and knees, typically degenerative. No additional worrisome sites of tracer uptake are seen to suggest osseous metastatic disease. Expected urinary tract and soft tissue distribution of tracer. IMPRESSION: Uptake at L2 vertebral body,  RIGHT seventh rib, and 2 sites in the RIGHT third rib suspicious for osseous metastases. Uptake at L2 and lateral RIGHT third rib correspond to destructive lesions on CT. No CT findings are seen at the anterior RIGHT third rib or lateral RIGHT seventh rib sites of bone scan abnormality. Electronically Signed   By: Lavonia Dana M.D.   On: 10/22/2020 16:08   DG CHEST PORT 1 VIEW  Result Date: 10/22/2020 CLINICAL DATA:  Pulmonary masses, status post bronchoscopy with biopsy EXAM: PORTABLE CHEST 1 VIEW COMPARISON:  CT chest 10/20/2020 FINDINGS: Dual lead pacer in place. Prior CABG. Atherosclerotic calcification of the aortic arch. No pneumothorax. Stable appearance of mass in the right upper lobe. Indistinct lingular density  compatible with previously seen pleural thickening and scarring. Stable right perihilar fullness likely from adenopathy. No blunting of the right costophrenic angle. Stable mild cardiomegaly. IMPRESSION: 1. No pneumothorax status post bronchoscopy. 2. Stable appearance of the right upper lobe mass and right perihilar adenopathy. 3. Stable mild cardiomegaly. 4. Lingular pleural thickening and scarring. Electronically Signed   By: Van Clines M.D.   On: 10/22/2020 17:09   DG C-ARM BRONCHOSCOPY  Result Date: 10/22/2020 C-ARM BRONCHOSCOPY: Fluoroscopy was utilized by the requesting physician.  No radiographic interpretation.        Scheduled Meds: . [MAR Hold] acetaminophen  1,000 mg Oral TID  . [MAR Hold] amLODipine  5 mg Oral Daily  . [MAR Hold] brimonidine  1 drop Both Eyes TID  . [MAR Hold] cholecalciferol  2,000 Units Oral Daily  . [MAR Hold] dorzolamide  1 drop Both Eyes TID  . [MAR Hold] feeding supplement  1 Container Oral TID BM  . [MAR Hold] furosemide  80 mg Oral Daily  . [MAR Hold] HYDROmorphone   Intravenous Q4H  . [MAR Hold] lactulose  20 g Oral Daily  . [MAR Hold] latanoprost  1 drop Both Eyes QHS  . [MAR Hold] lidocaine  1 patch Transdermal Q24H  . [MAR Hold] magnesium oxide  400 mg Oral BID  . [MAR Hold] multivitamin with minerals  1 tablet Oral Daily  . [MAR Hold] pantoprazole  40 mg Oral Daily  . [MAR Hold] polyethylene glycol  17 g Oral BID  . [MAR Hold] potassium chloride SA  40 mEq Oral Daily  . [MAR Hold] senna  2 tablet Oral BID   Continuous Infusions: . acetaminophen    . ceFAZolin    . lactated ringers 10 mL/hr at 10/24/20 1104     LOS: 5 days    Time spent: 37 minutes spent on chart review, discussion with nursing staff, consultants, updating family and interview/physical exam; more than 50% of that time was spent in counseling and/or coordination of care.    Coree Brame J [place] DO [facility] Available via Epic secure chat 7am-7pm After  these hours, please refer to coverage provider listed on [email or web address] 10/24/2020, 1:39 PM

## 2020-10-24 NOTE — Procedures (Addendum)
S/P L2 tumor ablation followed by balloon KP with biopsy. Patient awake and appropriately responsive. Moves all 4s. [name]  S.Aiyah Scarpelli MD

## 2020-10-24 NOTE — Progress Notes (Signed)
Patient ID: Jonathan Warner, male   DOB: [birth year], 87 y.o.   MRN: [id number]   87 year old gentleman admitted for back pain concern for L2 compression fracture versus metastatic disease.  Large lung mass found on CT scan, pulmonary consulted for tissue diagnosis management.  Palliative consulted for GOCs and pain management/back pain 2/2 metastatic disease. Bronchoscopy specimen pathology shows squamous cell carcinoma, pending molecular analysis. Possible bone biopsy of L2 mass during vertebroplasty today.   This PA visited patient at the bedside as a follow up for palliative medicine needs and emotional support. Discussed with RN/Jonathan Warner, Jonathan Lessen NP, Jonathan Dike PA-C, Dr. [place] (Jonathan Warner), patient, patient's wife and son.  Patient is alert and oriented x3 although more lethargic today. His son Jonathan Warner and wife Jonathan Warner are present at the bedside. Assisted patient and family with clarification of today's procedure and expectations for pain relief. Hopefully the vertebroplasty will provide noticeable pain relief and increased quality of life in as little as 24 hours. Family understands that it may take up to 1 week for molecular analysis of biopsies to be completed.    Discussed anticipatory care needs and discharge planning. Family understands that today's procedure and transition off PCA will be inpatient while additional radiation therapies and diagnostics may occur in outpatient setting.   Patient has only utilized 0.4 mg of Dilaudid/PCA past 24 hrs. RN/Jonathan Warner states that patient reports 8/10 with walking and family agrees that his pain is much worse with movements even in bed. Patient states that his pain is well-controlled at the moment.   Plan of care - Continue scheduled Tylenol 1000 mg po TID - Continue with PCA today then re-evaluate tomorrow for next steps in recommendations for pain managment. May benefit from Fentanyl patch/extended release oral opioid - Continue to provide psychosocial and emotional  support, chaplain support appreciated  Discussed with patient the importance of continued conversation with family and the medical providers regarding overall plan of care and treatment options,  ensuring decisions are within the context of the patients values and GOCs.  Questions and concerns addressed. Patient and family provided with PMT contact information. Encouraged to call with additional questions or concerns.      Total time spent on the unit was 35 minutes  Greater than 50% of the time was spent in counseling and coordination of care related to the above assessment and plan.  Jonathan Warner Signs Palliative Medicine Team Team Phone # [phone]

## 2020-10-25 ENCOUNTER — Encounter (HOSPITAL_COMMUNITY): Payer: Self-pay | Admitting: Interventional Radiology

## 2020-10-25 ENCOUNTER — Telehealth: Payer: Self-pay | Admitting: *Deleted

## 2020-10-25 DIAGNOSIS — R918 Other nonspecific abnormal finding of lung field: Secondary | ICD-10-CM | POA: Diagnosis not present

## 2020-10-25 DIAGNOSIS — Z9889 Other specified postprocedural states: Secondary | ICD-10-CM

## 2020-10-25 DIAGNOSIS — Z515 Encounter for palliative care: Secondary | ICD-10-CM | POA: Diagnosis not present

## 2020-10-25 DIAGNOSIS — C3411 Malignant neoplasm of upper lobe, right bronchus or lung: Secondary | ICD-10-CM | POA: Diagnosis not present

## 2020-10-25 DIAGNOSIS — M8448XA Pathological fracture, other site, initial encounter for fracture: Secondary | ICD-10-CM | POA: Diagnosis not present

## 2020-10-25 DIAGNOSIS — C7951 Secondary malignant neoplasm of bone: Secondary | ICD-10-CM | POA: Diagnosis not present

## 2020-10-25 DIAGNOSIS — G893 Neoplasm related pain (acute) (chronic): Secondary | ICD-10-CM | POA: Diagnosis not present

## 2020-10-25 DIAGNOSIS — C3431 Malignant neoplasm of lower lobe, right bronchus or lung: Secondary | ICD-10-CM | POA: Diagnosis not present

## 2020-10-25 LAB — CBC
HCT: 39.6 % (ref 39.0–52.0)
Hemoglobin: 13 g/dL (ref 13.0–17.0)
MCH: 31.5 pg (ref 26.0–34.0)
MCHC: 32.8 g/dL (ref 30.0–36.0)
MCV: 95.9 fL (ref 80.0–100.0)
Platelets: 178 10*3/uL (ref 150–400)
RBC: 4.13 MIL/uL — ABNORMAL LOW (ref 4.22–5.81)
RDW: 13.9 % (ref 11.5–15.5)
WBC: 6.9 10*3/uL (ref 4.0–10.5)
nRBC: 0 % (ref 0.0–0.2)

## 2020-10-25 LAB — SURGICAL PATHOLOGY

## 2020-10-25 LAB — BASIC METABOLIC PANEL
Anion gap: 11 (ref 5–15)
BUN: 22 mg/dL (ref 8–23)
CO2: 24 mmol/L (ref 22–32)
Calcium: 8.2 mg/dL — ABNORMAL LOW (ref 8.9–10.3)
Chloride: 100 mmol/L (ref 98–111)
Creatinine, Ser: 1.02 mg/dL (ref 0.61–1.24)
GFR, Estimated: >60 mL/min (ref >60)
Glucose, Bld: 114 mg/dL — ABNORMAL HIGH (ref 70–99)
Potassium: 3.6 mmol/L (ref 3.5–5.1)
Sodium: 135 mmol/L (ref 135–145)

## 2020-10-25 LAB — MAGNESIUM: Magnesium: 2.4 mg/dL (ref 1.7–2.4)

## 2020-10-25 LAB — LACTATE DEHYDROGENASE: LDH: 231 U/L — ABNORMAL HIGH (ref 98–192)

## 2020-10-25 MED ORDER — HYDROMORPHONE HCL 1 MG/ML IJ SOLN
0.5000 mg | INTRAMUSCULAR | Status: DC | PRN
  Administered 2020-10-28 – 2020-10-29 (×4): 0.5 mg via INTRAVENOUS
  Filled 2020-10-25 (×4): qty 0.5

## 2020-10-25 MED ORDER — OXYCODONE HCL ER 10 MG PO T12A
10.0000 mg | EXTENDED_RELEASE_TABLET | Freq: Two times a day (BID) | ORAL | Status: DC
  Administered 2020-10-25 – 2020-10-29 (×9): 10 mg via ORAL
  Filled 2020-10-25 (×9): qty 1

## 2020-10-25 MED ORDER — OXYCODONE HCL 5 MG PO TABS
5.0000 mg | ORAL_TABLET | ORAL | Status: DC | PRN
  Administered 2020-10-25 – 2020-10-28 (×2): 5 mg via ORAL
  Filled 2020-10-25 (×2): qty 1

## 2020-10-25 MED ORDER — BISACODYL 10 MG RE SUPP
10.0000 mg | Freq: Once | RECTAL | Status: DC
  Filled 2020-10-25 (×2): qty 1

## 2020-10-25 MED ORDER — NYSTATIN 100000 UNIT/ML MT SUSP
5.0000 mL | Freq: Four times a day (QID) | OROMUCOSAL | Status: DC
  Administered 2020-10-25 – 2020-10-31 (×25): 500000 [IU] via ORAL
  Filled 2020-10-25 (×23): qty 5

## 2020-10-25 NOTE — Progress Notes (Signed)
Patient ID: Jonathan Warner, male   DOB: 12-Jan-1932, 87 y.o.   MRN: [id number]   87 year old gentleman admitted for back pain concern for L2 compression fracture versus metastatic disease.  Large lung mass found on CT scan, pulmonary consulted for tissue diagnosis management.  Palliative consulted for GOCs and pain management/back pain 2/2 metastatic disease. Bronchoscopy specimen pathology shows squamous cell carcinoma, pending molecular analysis. Patient underwent vertebroplasty procedure yesterday.    This PA visited patient at the bedside as a follow up for palliative medicine needs and emotional support. Discussed with RN/Jonathan Warner, Jonathan Ruiz NP, Dr. [place] Dr. Marin Warner, Jonathan Warner, patient, patient's wife and family friend Jonathan Warner.   Patient reports sleeping very well last night. He has not had a BM in several days. His lower abdomen is mildly tender, no distension. Has received Bisacodyl suppository this morning. He coughs up brown phlegm occasionally. He has used incentive spirometer on occasion but infrequently.  Patient is alert and oriented x3. He is initially hesitant to attempt transfer from bed to chair. He reports mild stiffness and discomfort but no pain or distress. He stands without difficulty after encouragement and with assistance. He reports immense improvement of pain s/p vertebroplasty. Appears visibly relieved upon standing. Verbalizes that stretching OOB has immediately alleviated both his back and abdominal discomfort. He looks forward to participating in PT and "learning how to move again without the pain."  Patient has only utilized 0.3 mg of Dilaudid/PCA in past 24 hrs. Discussed plan to transition off PCA and start oral opioids in anticipation of discharge to home. Questions and concerns addressed. Patient is agreeable.   GOC:  A MOST form was introduced and reviewed in depth. Family was kind enough to bring in his current original MOST form. I  expressed that another conversation would be helpful, as many aspects of Jonathan Warner's health have changed since the MOST form was completed in September 2021. Patient and family agree and are ready to discuss today. Risks and benefits of CPR were discussed. Additional medical interventions such as BiPAP, rehospitalization, antibiotics, artificial hydration and feeding tubes were discussed. Discussed the difference between medical interventions and a comfort focused path. Questions and concerns addressed.  Harm clearly states that he would not desire CPR or intubation. He is at peace with this decision and states that he will accept God's will when the time comes. He understands the severity of his illness and acknowledges that CPR would not improve his quality of life nor prolong his life given his terminal diagnosis. He is still open to limited medical interventions and rehospitalization at this time. He would also elect to receive antibiotics and IV fluids if indicated. His primary goal is relief from pain and suffering, realizing that these interventions will not provide a cure for his cancer. He understands that he can decide to transition to hospice and comfort only measures if at any point he feels the burden of treatments outweigh the benefit.   Discussed with patient the importance of continued conversation with family and the medical providers regarding overall plan of care and treatment options, ensuring decisions are within the context of the patients values and GOCs.  Questions and concerns addressed. Patient and family has PMT contact information. Encouraged to call with additional questions or concerns.    Addendum: Revisited patient and family after their discussion with [facility] representative. They will need additional clarification of hospice in the context of above goals. They elected to return home with hospice, but were not aware  that this means palliative radiation and physical  therapy would not be offered. Assisted patient with stand and stretch as physical therapy was unable to work with him today.   Plan of care - DNR/DNI. Original form placed in patient chart. Copy provided to patient/family. Will scan copy into [facility]/EMR. - MOST form complete. Copy provided to patient/family. Original placed in hard chart. Will scan copy into [facility]/EMR. - Discontinue PCA and transition to Oxycontin 10mg  BID - Oxycodone 5mg  q3h PRN for breakthrough pain - Dilaudid 0.5mg  IV q3h PRN for breakthrough pain refractory to oral meds - Nystatin oral suspension QID x 7 days for new onset oral thrush. BID oral care discussed with RN. Jonathan Warner contacted for further hospice education and anticipatory care needs. ** This will need to be revisited before discharge to ensure patient's goals align. **  - Encouraged increased use of incentive spirometer - Continue to provide psychosocial and emotional support  - Spiritual/Chaplain support appreciated   Total time spent on the unit was 135 minutes Prolonged time: Yes   Greater than 50% of the time was spent in counseling and coordinating of care related to the above assessment and plan.  Jonathan Warner Jonathan Warner Palliative Medicine Team Team Phone # [phone]

## 2020-10-25 NOTE — Progress Notes (Signed)
Nutrition Follow-up  DOCUMENTATION CODES:   Not applicable  INTERVENTION:   -Continue MVI with minerals daily -Continue Boost Breeze po TID, each supplement provides 250 kcal and 9 grams of protein  NUTRITION DIAGNOSIS:   Inadequate oral intake related to decreased appetite as evidenced by per patient/family report.  Ongoing  GOAL:   Patient will meet greater than or equal to 90% of their needs  Progressing   MONITOR:   PO intake,Supplement acceptance,Labs,Weight trends,Skin,I & O's  REASON FOR ASSESSMENT:   Malnutrition Screening Tool    ASSESSMENT:   Jonathan Warner is a 87 y.o. male with medical history significant of remote history of prostate CA status post surgical removal and radiation, CAD s/p CABG, chronic A. fib on Eliquis, tachybradycardia syndrome status post PPM, chronic diastolic CHF, recently diagnosed L2 metastatic CA and pathologic fracture presented with worsening of back pain and ambulation dysfunction.  3/1- s/p Procedure(s): Flexible video fiberoptic bronchoscopy with electromagnetic navigation and biopsies. 3/3- s/p S/P L2 tumor ablation followed by balloon KP with biopsy  Reviewed I/O's: -1.1 L x 24 hours and -3 L since admission  UOP: 2.3L x 24 hours  Pt unavailable at time of visit.   Per oncology notes, hopeful for radiation treatments to start next week.   Pt with good appetite. Noted meal completions 100%. Pt is consuming Boost Breeze supplements per MAR (pt with dislike of milk and milky-type foods- he suspects he has some degree of lactose intolerance).   Medications reviewed and include vitamin D3, lactulose, miralax, potassium chloride, and senna.   Labs reviewed.   Diet Order:   Diet Order            Diet Heart Room service appropriate? Yes; Fluid consistency: Thin  Diet effective now                 EDUCATION NEEDS:   Education needs have been addressed  Skin:  Skin Assessment: Skin Integrity Issues: Skin Integrity  Issues:: Incisions Incisions: kyphoplasty puncture x 2  Last BM:  10/23/20  Height:   Ht Readings from Last 1 Encounters:  10/24/20 5' 7.99" (1.727 m)    Weight:   Wt Readings from Last 1 Encounters:  10/24/20 70.3 kg    Ideal Body Weight:  70 kg  BMI:  Body mass index is 23.57 kg/m.  Estimated Nutritional Needs:   Kcal:  3329-5188  Protein:  90-105 grams  Fluid:  > 1.7 L    Loistine Chance, RD, LDN, [place] Registered Dietitian II Certified Diabetes Care and Education Specialist Please refer to [facility] for RD and/or RD on-call/weekend/after hours pager

## 2020-10-25 NOTE — Progress Notes (Signed)
Manufacturing engineer [facility]) Hospital Liaison: RN note     Notified by Transition of Care Manger of patient/family request for [facility] services at home after discharge. Chart and patient information under review by [facility] physician. Hospice eligibility pending currently.     Writer spoke with patient and his wife, Jonathan Warner  to initiate education related to hospice philosophy, services and team approach to care.  Both verbalized understanding of information given. Per discussion, plan is for discharge to home by  PTAR.   Please send signed and completed DNR form home with patient/family. Patient will need prescriptions for discharge comfort medications.      DME needs have been discussed, patient currently has the following equipment in the home:  Walker, 3N1 and W/C.  Patient/family requests the following DME for delivery to the home:  Hospital bed. [place] equipment manager has been notified and will contact DME provider to arrange delivery to the home. Home address has been verified and is correct in the chart.      Jonathan Warner  is the family member to contact to arrange time of delivery.      [facility] aware of the above. Please notify [facility] when patient is ready to leave the unit at discharge. (Call [phone] or [phone] after 5pm.) [facility] information and contact numbers given to [facility].       A Please do not hesitate to call with questions.     Thank you,     Farrel Gordon, RN, [place] (listed on [facility] under [place])      [phone]

## 2020-10-25 NOTE — Progress Notes (Signed)
Jonathan Warner had the kyphoplasty yesterday.  As always, interventional radiology did a wonderful job.  Hopefully, he will be able to be more active now.  I am not sure on takes for the cement to "set up."  Biopsies were taken from the L2 vertebral body.  We will have to see what they show.  He is on heparin.  He can probably  move back to Eliquis now.  I cannot imagine any additional invasive studies that need to be done.  Hopefully, radiation therapy will be able to start next week.  Again, our goal for him is to get his pain under control so he can be more functional.  I am unsure if he is can be able to go home right now.  He really needs to get more mobile.  He still has not had a bowel movement.  I think he is on stool softeners and laxatives.  There are no labs today.  Lattie Haw, MD  Colossians 4:2

## 2020-10-25 NOTE — Progress Notes (Signed)
PROGRESS NOTE    Jonathan Warner  HQI:[id number] DOB: 04/08/1932 DOA: 10/19/2020 PCP: Josetta Huddle, MD    Brief Narrative:  Jonathan Warner is an 87 year old male with past medical history significant for prostate cancer status post , CAD s/p CABG, chronic atrial fibrillation on Eliquis, tachybradycardia syndrome s/p PPM, chronic diastolic congestive heart failure, with recent diagnosis of metastatic L2 compression fracture who was brought to the hospital with severe pain and difficulty ambulating. Patient initially reported fell around Christmas time and has been having pain since. He is follows with orthopedics outpatient Dr. Ninfa Linden and was using a lumbar brace without much help. Patient denies any urinary problems or difficulty moving his bowels. No numbness or weakness of lower extremities. Does report decreased appetite over the past 2 months with being pretty much bedbound over the last few weeks due to his pain. Hospital service was consulted for further evaluation and management of intractable pain from lumbar spinous compression fraction with concerns of malignancy.   Assessment & Plan:   Active Problems:   Atrial fibrillation (HCC)   Prostate cancer (HCC)   S/P bronchoscopy with biopsy   Chronic anticoagulation   Pathological fracture   Impaired ambulation   Metastatic cancer to spine [facility])   Palliative care by specialist   Mass of upper lobe of right lung   Lung mass   L2 pathological fracture Right 3rd/7th Rib lesion concerning for malignancy Patient presenting to ED with intractable pain over the past several weeks to his L-spine. Initial fall in 07/2020. CT chest/abdomen/pelvis with contrast with osseous metastatic disease to the right 3rd rib and L2 vertebral body; small epidural component noted involving the L2 vertebral body which is largely eroded.  Nuclear medicine bone scan 10/22/2020 with uptake at L2 vertebral body, right third rib, right seventh rib concerning for osseous  metastasis. Underwent L2 tumor ablation with balloon kyphoplasty and biopsy by IR Dr. Estanislado Pandy --Medical oncology/palliative care, and IR following --Continue back brace/abdominal binder as needed --Lidoderm patch --Scheduled Tylenol 1g TID --OxyContin 10 mg q12h --Oxycodone 5 mg q3h prn moderate pain --Dilaudid 0.5 mg IV q3h prn severe pain --Pending radiation therapy, possibly next week  Squamous cell carcinoma lung CT angiogram chest/abdomen/pelvis with multiple pulmonary masses with right upper lobe mass confluence with a rind of soft tissue encasing the right mainstem bronchus resulting in mild narrowing of the mainstem bronchus.  Underwent bronchoscopy by Dr. Valeta Harms on 10/22/2020. --PCCM/medical oncology following, appreciate assistance --Follow-up surgical biopsies for molecular studies  Hypokalemia Potassium 3.7 today --Continue to monitor electrolytes daily  Essential hypertension --Amlodipine 5 mg p.o. daily  Chronic diastolic congestive heart failure, compensated --Furosemide 80 mg p.o. daily  Chronic atrial fibrillation Tachybradycardia syndrome s/p PPM --Resume Eliquis    DVT prophylaxis: Eliquis   Code Status: DNR Family Communication: Updated patient spouse at bedside this morning  Disposition Plan:  Level of care: Med-Surg Status is: Inpatient  Remains inpatient appropriate because:Ongoing active pain requiring inpatient pain management, Ongoing diagnostic testing needed not appropriate for outpatient work up, Unsafe d/c plan, IV treatments appropriate due to intensity of illness or inability to take PO and Inpatient level of care appropriate due to severity of illness   Dispo:  Patient From: Home  Planned Disposition: Home with Health Care Svc  Medically stable for discharge: No       Consultants:   Oncology, Dr. Marin Olp  PCCM, Dr. Valeta Harms  Palliative care  Interventional radiology  Procedures:   Bronchoscopy 10/22/2020, Dr.  Valeta Harms  Antimicrobials:   None   Subjective: Patient seen and examined bedside, resting comfortably.  Sitting in bedside chair.  Patient spouse and palliative care present.  Patient states low back pain improved today following kyphoplasty yesterday.  Plan to transition Dilaudid PCA to oral meds today.  Spouse concerned about being able to transport patient to radiation treatments and requests transfer to [place] [facility] for assistance.  Awaiting to hear back from radiation oncology regarding plan for radiation prior to consideration for transfer.  No other questions or concerns at the time.  Denies headache, no visual changes, no chest pain, no palpitations, no shortness of breath, no abdominal pain, no fever/chills/night sweats, no nausea/vomiting/diarrhea. No acute events overnight per nursing staff.  Objective: Vitals:   10/25/20 0442 10/25/20 0734 10/25/20 1030 10/25/20 1442  BP:  140/68  124/75  Pulse:  76  79  Resp: 14 18 13 18   Temp:  98 F (36.7 C)  97.9 F (36.6 C)  TempSrc:      SpO2: 98% 99% 99% 98%  Weight:      Height:        Intake/Output Summary (Last 24 hours) at 10/25/2020 1456 Last data filed at 10/25/2020 0500 Gross per 24 hour  Intake 360.15 ml  Output 1350 ml  Net -989.85 ml   Filed Weights   10/19/20 1549 10/24/20 1057  Weight: 70.3 kg 70.3 kg    Examination:  General exam: Appears calm and comfortable Respiratory system: Clear to auscultation. Respiratory effort normal. Oxygenating well on room air Cardiovascular system: S1 & S2 heard, RRR. No JVD, murmurs, rubs, gallops or clicks. No pedal edema. Gastrointestinal system: Abdomen is nondistended, soft and nontender. No organomegaly or masses felt. Normal bowel sounds heard. Central nervous system: Alert and oriented. No focal neurological deficits. Extremities: Mild tenderness to palpation midline back about lumbar segments, improved today. Skin: No rashes, lesions or ulcers Psychiatry: Judgement and  insight appear normal. Mood & affect appropriate.     Data Reviewed: I have personally reviewed following labs and imaging studies  CBC: Recent Labs  Lab 10/21/20 0059 10/22/20 0122 10/23/20 0547 10/24/20 0337 10/25/20 0841  WBC 6.8 6.7 4.6 5.1 6.9  HGB 11.6* 10.8* 11.9* 12.0* 13.0  HCT 36.3* 34.3* 36.3* 36.5* 39.6  MCV 95.5 97.7 95.3 95.5 95.9  PLT 210 191 193 178 500   Basic Metabolic Panel: Recent Labs  Lab 10/19/20 1228 10/20/20 0556 10/21/20 0059 10/22/20 0122 10/23/20 0547 10/24/20 0337 10/25/20 0841  NA 139   < > 137 136 138 137 135  K 3.0*   < > 3.8 3.7 3.3* 3.7 3.6  CL 97*   < > 98 97* 99 101 100  CO2 29   < > 26 29 27 25 24   GLUCOSE 114*   < > 162* 181* 157* 137* 114*  BUN 12   < > 19 24* 25* 25* 22  CREATININE 1.15   < > 1.23 1.17 1.08 1.07 1.02  CALCIUM 9.6   < > 9.4 8.5* 8.1* 8.1* 8.2*  MG 1.6*  --   --   --   --  2.2 2.4   < > = values in this interval not displayed.   GFR: Estimated Creatinine Clearance: 50.3 mL/min (by C-G formula based on SCr of 1.02 mg/dL). Liver Function Tests: Recent Labs  Lab 10/19/20 1228  AST 23  ALT 11  ALKPHOS 95  BILITOT 1.9*  PROT 6.7  ALBUMIN 3.4*   No results for input(s): LIPASE,  AMYLASE in the last 168 hours. No results for input(s): AMMONIA in the last 168 hours. Coagulation Profile: No results for input(s): INR, PROTIME in the last 168 hours. Cardiac Enzymes: No results for input(s): CKTOTAL, CKMB, CKMBINDEX, TROPONINI in the last 168 hours. BNP (last 3 results) Recent Labs    11/10/19 1038 12/08/19 1232 01/30/20 1336  PROBNP 2,698* 2,453* 2,125*   HbA1C: No results for input(s): HGBA1C in the last 72 hours. CBG: No results for input(s): GLUCAP in the last 168 hours. Lipid Profile: No results for input(s): CHOL, HDL, LDLCALC, TRIG, CHOLHDL, LDLDIRECT in the last 72 hours. Thyroid Function Tests: No results for input(s): TSH, T4TOTAL, FREET4, T3FREE, THYROIDAB in the last 72 hours. Anemia  Panel: No results for input(s): VITAMINB12, FOLATE, FERRITIN, TIBC, IRON, RETICCTPCT in the last 72 hours. Sepsis Labs: No results for input(s): PROCALCITON, LATICACIDVEN in the last 168 hours.  Recent Results (from the past 240 hour(s))  SARS CORONAVIRUS 2 (TAT 6-24 HRS) Nasopharyngeal Nasopharyngeal Swab     Status: None   Collection Time: 10/19/20  1:47 PM   Specimen: Nasopharyngeal Swab  Result Value Ref Range Status   SARS Coronavirus 2 NEGATIVE NEGATIVE Final    Comment: (NOTE) SARS-CoV-2 target nucleic acids are NOT DETECTED.  The SARS-CoV-2 RNA is generally detectable in upper and lower respiratory specimens during the acute phase of infection. Negative results do not preclude SARS-CoV-2 infection, do not rule out co-infections with other pathogens, and should not be used as the sole basis for treatment or other patient management decisions. Negative results must be combined with clinical observations, patient history, and epidemiological information. The expected result is Negative.  Fact Sheet for Patients: SugarRoll.be  Fact Sheet for Healthcare Providers: https://www.woods-mathews.com/  This test is not yet approved or cleared by the [place] FDA and  has been authorized for detection and/or diagnosis of SARS-CoV-2 by FDA under an Emergency Use Authorization (EUA). This EUA will remain  in effect (meaning this test can be used) for the duration of the COVID-19 declaration under Se ction 564(b)(1) of the Act, 21 U.S.C. section 360bbb-3(b)(1), unless the authorization is terminated or revoked sooner.  Performed at [facility], [place] [address], [place], [place] [address]   MRSA PCR Screening     Status: None   Collection Time: 10/22/20  5:54 AM   Specimen: Nasal Mucosa; Nasopharyngeal  Result Value Ref Range Status   MRSA by PCR NEGATIVE NEGATIVE Final    Comment:        The GeneXpert MRSA Assay (FDA approved  for NASAL specimens only), is one component of a comprehensive MRSA colonization surveillance program. It is not intended to diagnose MRSA infection nor to guide or monitor treatment for MRSA infections. Performed at [facility], [place] [address], [place], [place] [address]          Radiology Studies: No results found.      Scheduled Meds: . acetaminophen  1,000 mg Oral TID  . amLODipine  5 mg Oral Daily  . bisacodyl  10 mg Rectal Once  . brimonidine  1 drop Both Eyes TID  . cholecalciferol  2,000 Units Oral Daily  . dorzolamide  1 drop Both Eyes TID  . feeding supplement  1 Container Oral TID BM  . furosemide  80 mg Oral Daily  . lactulose  20 g Oral Daily  . latanoprost  1 drop Both Eyes QHS  . lidocaine  1 patch Transdermal Q24H  . magnesium  oxide  400 mg Oral BID  . multivitamin with minerals  1 tablet Oral Daily  . nystatin  5 mL Oral QID  . oxyCODONE  10 mg Oral Q12H  . pantoprazole  40 mg Oral Daily  . polyethylene glycol  17 g Oral BID  . potassium chloride SA  40 mEq Oral Daily  . senna  2 tablet Oral BID   Continuous Infusions:    LOS: 6 days    Time spent: 36 minutes spent on chart review, discussion with nursing staff, consultants, updating family and interview/physical exam; more than 50% of that time was spent in counseling and/or coordination of care.    Alekai Pocock J [place] DO [facility] Available via Epic secure chat 7am-7pm After these hours, please refer to coverage provider listed on [email or web address] 10/25/2020, 2:56 PM

## 2020-10-25 NOTE — Progress Notes (Signed)
Physical Therapy Treatment Patient Details Name: Jonathan Warner MRN: [id number] DOB: [birth year] Today's Date: 10/25/2020    History of Present Illness Taimur C Cahall is a 87 y.o. male with medical history significant of remote history of prostate CA status post surgical removal and radiation, CAD s/p CABG, chronic A. fib on Eliquis, tachybradycardia syndrome status post PPM, chronic diastolic CHF, recently diagnosed L2 metastatic CA and pathologic fracture presented with worsening of back pain and ambulation dysfunction. 10/24/20 L2 tumor ablation with vertebroplasty.    PT Comments    Pt up with wife upon arrival of PT, agreeable to sessio with focus on progressing OOB mobility following procedure on 3/3. The pt was able to complete short bout of ambulation in the room with RW and minG for safety but no LOB this afternoon, but continues to require min/modA to complete transfers and bed mobility due more to pain than weakness. The pt and his wife feel comfortable with pt's current mobility level and equipment at home, but are hopeful for transfer to [facility] for radiation treatment prior to return home. The pt will continue to benefit from skilled PT to maintain strength and endurance to allow for continued independence with short bouts of mobility in the home.     Follow Up Recommendations  Home health PT     Equipment Recommendations  3in1 (PT);Hospital bed    Recommendations for Other Services       Precautions / Restrictions Precautions Precautions: Fall;Other (comment) (back for comfort) Required Braces or Orthoses: Other Brace Other Brace: no orders for use of back brace, pt does have LSO in room Restrictions Weight Bearing Restrictions: No    Mobility  Bed Mobility Overal bed mobility: Needs Assistance Bed Mobility: Sit to Sidelying;Rolling Rolling: Min guard       Sit to sidelying: Mod assist General bed mobility comments: modA to BLE to lift into bed, pt able to manage  lowering trunk to flat bed    Transfers Overall transfer level: Needs assistance Equipment used: Rolling walker (2 wheeled) Transfers: Sit to/from Stand Sit to Stand: Min assist         General transfer comment: cues for technique, assist to rise, good control of descent to sitting surfaces  Ambulation/Gait Ambulation/Gait assistance: Min guard Gait Distance (Feet): 15 Feet Assistive device: Rolling walker (2 wheeled) Gait Pattern/deviations: Step-through pattern Gait velocity: decreased Gait velocity interpretation: <1.31 ft/sec, indicative of household ambulator General Gait Details: heavy reliance on UE support with slowed steps.       Balance Overall balance assessment: Needs assistance   Sitting balance-Leahy Scale: Fair     Standing balance support: Bilateral upper extremity supported Standing balance-Leahy Scale: Poor Standing balance comment: reliant on B UEs                            Cognition Arousal/Alertness: Awake/alert Behavior During Therapy: WFL for tasks assessed/performed Overall Cognitive Status: Within Functional Limits for tasks assessed                                           General Comments General comments (skin integrity, edema, etc.): pt and wife present, curious about delivery schedule for hospital bed. also discussing plan to d/c to [facility] for radiation prior to return home.      Pertinent Vitals/Pain Pain Assessment: Faces Faces  Pain Scale: Hurts little more Pain Location: back Pain Descriptors / Indicators: Sore Pain Intervention(s): Limited activity within patient's tolerance;Monitored during session;Repositioned           PT Goals (current goals can now be found in the care plan section) Acute Rehab PT Goals Patient Stated Goal: pain relief, return home PT Goal Formulation: With patient Time For Goal Achievement: 11/03/20 Potential to Achieve Goals: Good Progress towards PT goals:  Progressing toward goals    Frequency    Min 3X/week      PT Plan Current plan remains appropriate       AM-PAC PT "6 Clicks" Mobility   Outcome Measure  Help needed turning from your back to your side while in a flat bed without using bedrails?: A Little Help needed moving from lying on your back to sitting on the side of a flat bed without using bedrails?: A Little Help needed moving to and from a bed to a chair (including a wheelchair)?: A Little Help needed standing up from a chair using your arms (e.g., wheelchair or bedside chair)?: A Little Help needed to walk in hospital room?: A Little Help needed climbing 3-5 steps with a railing? : A Little 6 Click Score: 18    End of Session Equipment Utilized During Treatment: Gait belt Activity Tolerance: Patient tolerated treatment well Patient left: in bed;with call bell/phone within reach;with bed alarm set;with family/visitor present Nurse Communication: Mobility status PT Visit Diagnosis: Unsteadiness on feet (R26.81);Other abnormalities of gait and mobility (R26.89);Pain Pain - Right/Left: Right Pain - part of body:  (back)     Time: 2482-5003 PT Time Calculation (min) (ACUTE ONLY): 18 min  Charges:  $Gait Training: 8-22 mins                     Karma Ganja, PT, DPT   Acute Rehabilitation Department Pager #: [phone]   Otho Bellows 10/25/2020, 5:00 PM

## 2020-10-25 NOTE — Telephone Encounter (Signed)
Lung biopsy collected 10/22/2020 sent for [facility] per Dr Marin Olp orders.

## 2020-10-25 NOTE — TOC CAGE-AID Note (Signed)
Transition of Care [facility]) - CAGE-AID Screening   Patient Details  Name: Jonathan Warner MRN: [id number] Date of Birth: May 01, 1932  Transition of Care [facility]) CM/SW Contact:    Sharin Mons, RN Phone Number: 10/25/2020, 2:27 PM   Clinical Narrative: Pt denies ETOH or recreational drug usage.   CAGE-AID Screening:    Have You Ever Felt You Ought to Cut Down on Your Drinking or Drug Use?: No Have People Annoyed You By Critizing Your Drinking Or Drug Use?: No Have You Felt Bad Or Guilty About Your Drinking Or Drug Use?: No Have You Ever Had a Drink or Used Drugs First Thing In The Morning to Steady Your Nerves or to Get Rid of a Hangover?: No CAGE-AID Score: 0

## 2020-10-25 NOTE — Progress Notes (Signed)
This chaplain responded to PMT referral for F/U spiritual care.  The chaplain understands the Pt. code status changed to DNR and the Pt. is navigating the next steps in his medical care.  The RN is assisting the Pt. with a bowel movement at the time of the visit.  This chaplain will F/U as time permits.

## 2020-10-25 NOTE — Progress Notes (Signed)
Occupational Therapy Treatment Patient Details Name: Jonathan Warner MRN: [id number] DOB: 1931-10-28 Today's Date: 10/25/2020    History of present illness Jonathan Warner is a 87 y.o. male with medical history significant of remote history of prostate CA status post surgical removal and radiation, CAD s/p CABG, chronic A. fib on Eliquis, tachybradycardia syndrome status post PPM, chronic diastolic CHF, recently diagnosed L2 metastatic CA and pathologic fracture presented with worsening of back pain and ambulation dysfunction. 10/24/20 L2 tumor ablation with vertebroplasty.   OT comments  Educated pt in availability of AE for LB ADL, arthritis in his hands limits his ability benefit from sock aid and perhaps a reacher, recommended long handle bath sponge. Stood with min assist and verbal cues for technique. Pt not needing either back brace, reports improvement in pain since procedure yesterday.   Follow Up Recommendations  Home health OT;Supervision/Assistance - 24 hour    Equipment Recommendations  Hospital bed    Recommendations for Other Services      Precautions / Restrictions Precautions Precautions: Fall;Other (comment) (back for comfort) Required Braces or Orthoses: Other Brace Other Brace: pt states he does not need to use abdominal binder or back brace since his procedure       Mobility Bed Mobility               General bed mobility comments: pt received in chair    Transfers Overall transfer level: Needs assistance Equipment used: Rolling walker (2 wheeled) Transfers: Sit to/from Stand Sit to Stand: Min assist         General transfer comment: cues for technique, assist to rise, good control of descent to sitting surfaces    Balance Overall balance assessment: Needs assistance   Sitting balance-Leahy Scale: Fair     Standing balance support: Bilateral upper extremity supported Standing balance-Leahy Scale: Poor Standing balance comment: reliant on B UEs                            ADL either performed or assessed with clinical judgement   ADL                                         General ADL Comments: Pt's arthritis prevents him from being able to benefit from AE for LB ADL, will rely on his wife. Educated in use of 3 in 1 over toilet.     Vision       Perception     Praxis      Cognition Arousal/Alertness: Awake/alert Behavior During Therapy: WFL for tasks assessed/performed Overall Cognitive Status: Within Functional Limits for tasks assessed                                          Exercises     Shoulder Instructions       General Comments      Pertinent Vitals/ Pain       Pain Assessment: 0-10 Pain Score: 6  Pain Location: back Pain Descriptors / Indicators: Sore Pain Intervention(s): Monitored during session;Repositioned;PCA encouraged  Home Living  Prior Functioning/Environment              Frequency  Min 2X/week        Progress Toward Goals  OT Goals(current goals can now be found in the care plan section)  Progress towards OT goals: Progressing toward goals  Acute Rehab OT Goals Patient Stated Goal: pain relief, return home OT Goal Formulation: With patient Time For Goal Achievement: 11/04/20 Potential to Achieve Goals: Good  Plan Discharge plan remains appropriate    Co-evaluation                 AM-PAC OT "6 Clicks" Daily Activity     Outcome Measure   Help from another person eating meals?: None Help from another person taking care of personal grooming?: A Little Help from another person toileting, which includes using toliet, bedpan, or urinal?: A Little Help from another person bathing (including washing, rinsing, drying)?: A Lot Help from another person to put on and taking off regular upper body clothing?: A Little Help from another person to put on and taking off  regular lower body clothing?: A Lot 6 Click Score: 17    End of Session Equipment Utilized During Treatment: Gait belt;Rolling walker  OT Visit Diagnosis: Other abnormalities of gait and mobility (R26.89);Pain;Muscle weakness (generalized) (M62.81)   Activity Tolerance Patient tolerated treatment well   Patient Left in chair;with call bell/phone within reach;with family/visitor present   Nurse Communication          Time: 0973-5329 OT Time Calculation (min): 19 min  Charges: OT General Charges $OT Visit: 1 Visit OT Treatments $Self Care/Home Management : 8-22 mins  Nestor Lewandowsky, OTR/L Acute Rehabilitation Services Pager: [phone] Office: [phone]   [name] 10/25/2020, 12:33 PM

## 2020-10-25 NOTE — Progress Notes (Signed)
Supervising Physician: Luanne Bras  Patient Status:  [facility] - In-pt  Chief Complaint: L2 compression fracture s/p image-guided biopsy of L2 mass followed by ablation and vertebroplasty  Subjective: Patient sitting up in the recliner, wife and another male visitor at the bedside. He states his back pain is much less compared to pre-procedure pain levels.   Allergies: Pilocarpine and Rosuvastatin  Medications: Prior to Admission medications   Medication Sig Start Date End Date Taking? Authorizing Provider  acetaminophen (TYLENOL) 650 MG CR tablet Take 650 mg by mouth every 8 (eight) hours as needed for pain.   Yes [provider]  amLODipine (NORVASC) 5 MG tablet Take 1 tablet (5 mg total) by mouth daily. 07/10/20  Yes Belva Crome, MD  apixaban (ELIQUIS) 5 MG TABS tablet Take 5 mg by mouth 2 (two) times daily.   Yes Josetta Huddle, MD  brimonidine (ALPHAGAN) 0.2 % ophthalmic solution Place 1 drop into both eyes 3 (three) times daily.  09/27/18  Yes [provider]  Cholecalciferol (VITAMIN D3) 2000 units TABS Take 2,000 Units by mouth daily.   Yes [provider]  dorzolamide (TRUSOPT) 2 % ophthalmic solution Place 1 drop into both eyes 3 (three) times daily.  11/20/17  Yes [provider]  furosemide (LASIX) 40 MG tablet Take 2 tablets by mouth every morning.  Take one tablet by mouth every evening. Patient taking differently: Take 80 mg by mouth daily. 09/04/20  Yes Belva Crome, MD  HYDROcodone-acetaminophen (NORCO/VICODIN) 5-325 MG tablet TAKE ONE TABLET BY MOUTH EVERY 6 HOURS AS NEEDED FOR moderate pain Patient taking differently: Take 1 tablet by mouth every 6 (six) hours as needed for moderate pain. 10/18/20  Yes Mcarthur Rossetti, MD  latanoprost (XALATAN) 0.005 % ophthalmic solution Place 1 drop into both eyes at bedtime.   Yes [provider]  Multiple Vitamin (MULTIVITAMIN) tablet Take 1 tablet by mouth daily.   Yes  [provider]  nitroGLYCERIN (NITROSTAT) 0.4 MG SL tablet PLACE 1 TABLET UNDER TONGUE EVERY FIVE MINUTES AS NEEDED FOR CHEST PAIN. 12/08/19  Yes Belva Crome, MD  omeprazole (PRILOSEC) 20 MG capsule Take 20 mg by mouth daily.   Yes [provider]  potassium chloride SA (KLOR-CON M20) 20 MEQ tablet Take 1 tablet (20 mEq total) by mouth 2 (two) times daily. Patient taking differently: Take 40 mEq by mouth daily. 08/02/20  Yes Belva Crome, MD  trolamine salicylate (BLUE-EMU HEMP) 10 % cream Apply 1 application topically as needed for muscle pain.   Yes [provider]  vitamin E 400 UNIT capsule Take 400 Units by mouth daily.   Yes [provider]  traMADol (ULTRAM) 50 MG tablet TAKE ONE TABLET BY MOUTH EVERY 6 HOURS AS NEEDED Patient not taking: Reported on 10/19/2020 10/11/20   Mcarthur Rossetti, MD     Vital Signs: BP 124/75 (BP Location: Right Arm)   Pulse 79   Temp 97.9 F (36.6 C)   Resp 18   Ht 5' 7.99" (1.727 m)   Wt 155 lb (70.3 kg)   SpO2 98%   BMI 23.57 kg/m   Physical Exam Constitutional:      General: He is not in acute distress. Pulmonary:     Effort: Pulmonary effort is normal.  Musculoskeletal:     Comments: Mid-back procedure dressings are intact, clean and dry. No pain to palpation around the sites.   Skin:    General: Skin is warm  and dry.  Neurological:     Mental Status: He is alert and oriented to person, place, and time.     Imaging: NM Bone Scan Whole Body  Result Date: 10/22/2020 CLINICAL DATA:  Non-small cell lung cancer, staging, L2 fracture EXAM: NUCLEAR MEDICINE WHOLE BODY BONE SCAN TECHNIQUE: Whole body anterior and posterior images were obtained approximately 3 hours after intravenous injection of radiopharmaceutical. RADIOPHARMACEUTICALS:  21.3 mCi Technetium-75m MDP IV COMPARISON:  05/12/2012 Correlation: CT chest abdomen pelvis 10/20/2020, CT lumbar spine 10/17/2020 FINDINGS: Abnormal tracer uptake  at L2 corresponding to metastasis with pathologic fracture on CT. Abnormal tracer uptake at lateral RIGHT seventh rib without accompanying CT finding. Abnormal tracer uptake is seen at the anterior and lateral RIGHT third rib, with the uptake at the lateral RIGHT third rib corresponding to an area of bone destruction on CT series 7, image 53. Uptake at the shoulders, sternoclavicular joints, elbows, wrists, and knees, typically degenerative. No additional worrisome sites of tracer uptake are seen to suggest osseous metastatic disease. Expected urinary tract and soft tissue distribution of tracer. IMPRESSION: Uptake at L2 vertebral body, RIGHT seventh rib, and 2 sites in the RIGHT third rib suspicious for osseous metastases. Uptake at L2 and lateral RIGHT third rib correspond to destructive lesions on CT. No CT findings are seen at the anterior RIGHT third rib or lateral RIGHT seventh rib sites of bone scan abnormality. Electronically Signed   By: Lavonia Dana M.D.   On: 10/22/2020 16:08   DG CHEST PORT 1 VIEW  Result Date: 10/22/2020 CLINICAL DATA:  Pulmonary masses, status post bronchoscopy with biopsy EXAM: PORTABLE CHEST 1 VIEW COMPARISON:  CT chest 10/20/2020 FINDINGS: Dual lead pacer in place. Prior CABG. Atherosclerotic calcification of the aortic arch. No pneumothorax. Stable appearance of mass in the right upper lobe. Indistinct lingular density compatible with previously seen pleural thickening and scarring. Stable right perihilar fullness likely from adenopathy. No blunting of the right costophrenic angle. Stable mild cardiomegaly. IMPRESSION: 1. No pneumothorax status post bronchoscopy. 2. Stable appearance of the right upper lobe mass and right perihilar adenopathy. 3. Stable mild cardiomegaly. 4. Lingular pleural thickening and scarring. Electronically Signed   By: Van Clines M.D.   On: 10/22/2020 17:09   DG C-ARM BRONCHOSCOPY  Result Date: 10/22/2020 C-ARM BRONCHOSCOPY: Fluoroscopy was  utilized by the requesting physician.  No radiographic interpretation.    Labs:  CBC: Recent Labs    10/22/20 0122 10/23/20 0547 10/24/20 0337 10/25/20 0841  WBC 6.7 4.6 5.1 6.9  HGB 10.8* 11.9* 12.0* 13.0  HCT 34.3* 36.3* 36.5* 39.6  PLT 191 193 178 178    COAGS: Recent Labs    05/16/20 1746 10/20/20 0838 10/22/20 0122 10/23/20 0547 10/23/20 1536 10/24/20 0337  INR 1.4*  --   --   --   --   --   APTT 39*   < > 102* 43* 94* 138*   < > = values in this interval not displayed.    BMP: Recent Labs    01/30/20 0857 05/16/20 1746 07/31/20 1340 08/09/20 1305 10/19/20 1228 10/22/20 0122 10/23/20 0547 10/24/20 0337 10/25/20 0841  NA 141 135 141 139   < > 136 138 137 135  K 3.5 3.7 3.2* 3.5   < > 3.7 3.3* 3.7 3.6  CL 99 96* 99 98   < > 97* 99 101 100  CO2 27 26 28 24    < > 29 27 25 24   GLUCOSE 128* 123* 110* 120*   < >  181* 157* 137* 114*  BUN 22 22 14  33*   < > 24* 25* 25* 22  CALCIUM 9.8 8.8* 9.2 8.8   < > 8.5* 8.1* 8.1* 8.2*  CREATININE 1.42* 1.25* 1.25 1.21   < > 1.17 1.08 1.07 1.02  GFRNONAA 44* 52* 52* 54*   < > >60 >60 >60 >60  GFRAA 51* >60 60 62  --   --   --   --   --    < > = values in this interval not displayed.    LIVER FUNCTION TESTS: Recent Labs    11/10/19 1038 01/30/20 1336 05/16/20 1746 10/19/20 1228  BILITOT 0.6 0.6 1.0 1.9*  AST 24 23 21 23   ALT 14 14 15 11   ALKPHOS 116 126* 83 95  PROT 6.5 6.9 6.6 6.7  ALBUMIN 4.3 4.5 3.5 3.4*    Assessment and Plan:  L2 compression fracture s/p image-guided biopsy of L2 mass followed by ablation and vertebroplasty  Patient has states he has much less pain compared to pre-procedure levels. Procedure site is clean and dry. Patient with pending plans for discharge home with home health.   Please call IR with any questions.   Electronically Signed: Soyla Dryer, AGACNP-BC [phone] 10/25/2020, 3:50 PM   I spent a total of 15 Minutes at the the patient's bedside AND on the patient's  hospital floor or unit, greater than 50% of which was counseling/coordinating care for L2 biopsy, ablation and kyphoplasty post-care

## 2020-10-26 DIAGNOSIS — M8448XA Pathological fracture, other site, initial encounter for fracture: Secondary | ICD-10-CM | POA: Diagnosis not present

## 2020-10-26 DIAGNOSIS — C7951 Secondary malignant neoplasm of bone: Secondary | ICD-10-CM | POA: Diagnosis not present

## 2020-10-26 DIAGNOSIS — R918 Other nonspecific abnormal finding of lung field: Secondary | ICD-10-CM | POA: Diagnosis not present

## 2020-10-26 DIAGNOSIS — Z7901 Long term (current) use of anticoagulants: Secondary | ICD-10-CM | POA: Diagnosis not present

## 2020-10-26 LAB — BASIC METABOLIC PANEL
Anion gap: 10 (ref 5–15)
BUN: 23 mg/dL (ref 8–23)
CO2: 23 mmol/L (ref 22–32)
Calcium: 8.4 mg/dL — ABNORMAL LOW (ref 8.9–10.3)
Chloride: 102 mmol/L (ref 98–111)
Creatinine, Ser: 1.04 mg/dL (ref 0.61–1.24)
GFR, Estimated: >60 mL/min (ref >60)
Glucose, Bld: 136 mg/dL — ABNORMAL HIGH (ref 70–99)
Potassium: 3.9 mmol/L (ref 3.5–5.1)
Sodium: 135 mmol/L (ref 135–145)

## 2020-10-26 LAB — LACTATE DEHYDROGENASE: LDH: 211 U/L — ABNORMAL HIGH (ref 98–192)

## 2020-10-26 MED ORDER — APIXABAN 5 MG PO TABS
5.0000 mg | ORAL_TABLET | Freq: Two times a day (BID) | ORAL | Status: DC
  Administered 2020-10-26 – 2020-10-31 (×11): 5 mg via ORAL
  Filled 2020-10-26 (×2): qty 1
  Filled 2020-10-26: qty 2
  Filled 2020-10-26 (×8): qty 1

## 2020-10-26 NOTE — Progress Notes (Signed)
[facility])      This patient has been referred to [facility] for hospice services after discharge.  Based on most recent notes from [facility] inpt palliative it appears family would like to have further discussions with [facility] liaisons about goals of care, and may not be ready to make a decision on hospice at this time.  Called pt's hospital room with no answer and pt's son Kevin's phone; voicemail left.  Spoke with Mrs Weier who verbalized desire to see if radiation would help with pt's pain.  Agreed on plan for [facility] liaisons to follow along and to readdress readiness for hospice closer to time of discharge.  Thank you for the opportunity to participate in this patient's care.     Domenic Moras, BSN, RN [facility] Liaison [phone]   [phone] (24h on call)

## 2020-10-26 NOTE — Discharge Instructions (Signed)

## 2020-10-26 NOTE — Progress Notes (Signed)
PROGRESS NOTE    Jonathan Warner  NGE:[id number] DOB: Mar 23, 1932 DOA: 10/19/2020 PCP: Josetta Huddle, MD    Brief Narrative:  Jonathan Warner is an 87 year old male with past medical history significant for prostate cancer status post , CAD s/p CABG, chronic atrial fibrillation on Eliquis, tachybradycardia syndrome s/p PPM, chronic diastolic congestive heart failure, with recent diagnosis of metastatic L2 compression fracture who was brought to the hospital with severe pain and difficulty ambulating. Patient initially reported fell around Christmas time and has been having pain since. He is follows with orthopedics outpatient Dr. Ninfa Linden and was using a lumbar brace without much help. Patient denies any urinary problems or difficulty moving his bowels. No numbness or weakness of lower extremities. Does report decreased appetite over the past 2 months with being pretty much bedbound over the last few weeks due to his pain. Hospital service was consulted for further evaluation and management of intractable pain from lumbar spinous compression fraction with concerns of malignancy.   Assessment & Plan:   Active Problems:   Atrial fibrillation (HCC)   Prostate cancer (HCC)   S/P bronchoscopy with biopsy   Goals of care, counseling/discussion   Chronic anticoagulation   Pathological fracture   Impaired ambulation   Metastatic cancer to spine [facility])   Palliative care by specialist   Mass of upper lobe of right lung   Lung mass   L2 pathological fracture Right 3rd/7th Rib lesion concerning for malignancy Patient presenting to ED with intractable pain over the past several weeks to his L-spine. Initial fall in 07/2020. CT chest/abdomen/pelvis with contrast with osseous metastatic disease to the right 3rd rib and L2 vertebral body; small epidural component noted involving the L2 vertebral body which is largely eroded.  Nuclear medicine bone scan 10/22/2020 with uptake at L2 vertebral body, right third rib,  right seventh rib concerning for osseous metastasis. Underwent L2 tumor ablation with balloon kyphoplasty and biopsy by IR Dr. Estanislado Pandy --Medical/Radiation oncology/palliative care, and IR following --Continue back brace/abdominal binder as needed --Lidoderm patch --Scheduled Tylenol 1g TID --OxyContin 10 mg q12h --Oxycodone 5 mg q3h prn moderate pain (only 1 dose past 24h) --Dilaudid 0.5 mg IV q3h prn severe pain (not used past 24h) --Transfer to [facility] for planned radiation therapy starting on Monday per radiation oncology, Dr. Sondra Come.  Squamous cell carcinoma lung CT angiogram chest/abdomen/pelvis with multiple pulmonary masses with right upper lobe mass confluence with a rind of soft tissue encasing the right mainstem bronchus resulting in mild narrowing of the mainstem bronchus.  Underwent bronchoscopy by Dr. Valeta Harms on 10/22/2020. --PCCM/medical oncology following, appreciate assistance --Follow-up surgical biopsies for molecular studies  Hypokalemia Potassium 3.7 today --Continue to monitor electrolytes daily  Essential hypertension --Amlodipine 5 mg p.o. daily  Chronic diastolic congestive heart failure, compensated --Furosemide 80 mg p.o. daily  Chronic atrial fibrillation Tachybradycardia syndrome s/p PPM --Resume Eliquis   Weakness/fatigue/deconditioning: Continue PT/OT efforts while inpatient, plan discharge home with home health therapy when medically ready   DVT prophylaxis: Eliquis   Code Status: DNR Family Communication: Updated patient's family members present at bedside this morning  Disposition Plan:  Level of care: Med-Surg Status is: Inpatient  Remains inpatient appropriate because:Ongoing active pain requiring inpatient pain management, Ongoing diagnostic testing needed not appropriate for outpatient work up, Unsafe d/c plan, IV treatments appropriate due to intensity of illness or inability to take PO and Inpatient level of care appropriate  due to severity of illness   Dispo:  Patient From:  Home  Planned Disposition: Home with Health Care Svc  Medically stable for discharge: No       Consultants:   Oncology, Dr. Marin Olp  PCCM, Dr. Valeta Harms  Palliative care  Interventional radiology  Procedures:   Bronchoscopy 10/22/2020, Dr. Valeta Harms  Antimicrobials:   None   Subjective: Patient seen and examined bedside, resting comfortably.  Lying in bed.  Continues to report pain improved.  Only used 1 dose of as needed oxycodone, no IV Dilaudid past 24 hours.  Discussed with patient transfer to [facility] when bed available for planned radiation therapy on Monday.  No other questions or concerns at this time.  Denies headache, no fever/chills/night sweats, no nausea/vomiting/diarrhea, no chest pain, palpitations, no shortness of breath, no abdominal pain. No acute events overnight per nursing staff.  Objective: Vitals:   10/25/20 1442 10/25/20 2051 10/26/20 0416 10/26/20 0900  BP: 124/75 133/76 136/70 (!) 147/87  Pulse: 79 79 65 79  Resp: _0 Temp: 97.9 F (36.6 C) 98.1 F (36.7 C) 98 F (36.7 C) 98 F (36.7 C)  TempSrc:    Oral  SpO2: 98% 98% 99% 99%  Weight:      Height:        Intake/Output Summary (Last 24 hours) at 10/26/2020 1027 Last data filed at 10/25/2020 2124 Gross per 24 hour  Intake -  Output 650 ml  Net -650 ml   Filed Weights   10/19/20 1549 10/24/20 1057  Weight: 70.3 kg 70.3 kg    Examination:  General exam: Appears calm and comfortable Respiratory system: Clear to auscultation. Respiratory effort normal. Oxygenating well on room air Cardiovascular system: S1 & S2 heard, RRR. No JVD, murmurs, rubs, gallops or clicks. No pedal edema. Gastrointestinal system: Abdomen is nondistended, soft and nontender. No organomegaly or masses felt. Normal bowel sounds heard. Central nervous system: Alert and oriented. No focal neurological deficits. Extremities: Mild tenderness to palpation midline  back about lumbar segments, improved today. Skin: No rashes, lesions or ulcers Psychiatry: Judgement and insight appear normal. Mood & affect appropriate.     Data Reviewed: I have personally reviewed following labs and imaging studies  CBC: Recent Labs  Lab 10/21/20 0059 10/22/20 0122 10/23/20 0547 10/24/20 0337 10/25/20 0841  WBC 6.8 6.7 4.6 5.1 6.9  HGB 11.6* 10.8* 11.9* 12.0* 13.0  HCT 36.3* 34.3* 36.3* 36.5* 39.6  MCV 95.5 97.7 95.3 95.5 95.9  PLT 210 191 193 178 027   Basic Metabolic Panel: Recent Labs  Lab 10/19/20 1228 10/20/20 0556 10/22/20 0122 10/23/20 0547 10/24/20 0337 10/25/20 0841 10/26/20 0147  NA 139   < > 136 138 137 135 135  K 3.0*   < > 3.7 3.3* 3.7 3.6 3.9  CL 97*   < > 97* 99 101 100 102  CO2 29   < > _1 GLUCOSE 114*   < > 181* 157* 137* 114* 136*  BUN 12   < > 24* 25* 25* 22 23  CREATININE 1.15   < > 1.17 1.08 1.07 1.02 1.04  CALCIUM 9.6   < > 8.5* 8.1* 8.1* 8.2* 8.4*  MG 1.6*  --   --   --  2.2 2.4  --    < > = values in this interval not displayed.   GFR: Estimated Creatinine Clearance: 49.3 mL/min (by C-G formula based on SCr of 1.04 mg/dL). Liver Function Tests: Recent Labs  Lab 10/19/20 1228  AST 23  ALT  11  ALKPHOS 95  BILITOT 1.9*  PROT 6.7  ALBUMIN 3.4*   No results for input(s): LIPASE, AMYLASE in the last 168 hours. No results for input(s): AMMONIA in the last 168 hours. Coagulation Profile: No results for input(s): INR, PROTIME in the last 168 hours. Cardiac Enzymes: No results for input(s): CKTOTAL, CKMB, CKMBINDEX, TROPONINI in the last 168 hours. BNP (last 3 results) Recent Labs    11/10/19 1038 12/08/19 1232 01/30/20 1336  PROBNP 2,698* 2,453* 2,125*   HbA1C: No results for input(s): HGBA1C in the last 72 hours. CBG: No results for input(s): GLUCAP in the last 168 hours. Lipid Profile: No results for input(s): CHOL, HDL, LDLCALC, TRIG, CHOLHDL, LDLDIRECT in the last 72 hours. Thyroid  Function Tests: No results for input(s): TSH, T4TOTAL, FREET4, T3FREE, THYROIDAB in the last 72 hours. Anemia Panel: No results for input(s): VITAMINB12, FOLATE, FERRITIN, TIBC, IRON, RETICCTPCT in the last 72 hours. Sepsis Labs: No results for input(s): PROCALCITON, LATICACIDVEN in the last 168 hours.  Recent Results (from the past 240 hour(s))  SARS CORONAVIRUS 2 (TAT 6-24 HRS) Nasopharyngeal Nasopharyngeal Swab     Status: None   Collection Time: 10/19/20  1:47 PM   Specimen: Nasopharyngeal Swab  Result Value Ref Range Status   SARS Coronavirus 2 NEGATIVE NEGATIVE Final    Comment: (NOTE) SARS-CoV-2 target nucleic acids are NOT DETECTED.  The SARS-CoV-2 RNA is generally detectable in upper and lower respiratory specimens during the acute phase of infection. Negative results do not preclude SARS-CoV-2 infection, do not rule out co-infections with other pathogens, and should not be used as the sole basis for treatment or other patient management decisions. Negative results must be combined with clinical observations, patient history, and epidemiological information. The expected result is Negative.  Fact Sheet for Patients: SugarRoll.be  Fact Sheet for Healthcare Providers: https://www.woods-mathews.com/  This test is not yet approved or cleared by the [place] FDA and  has been authorized for detection and/or diagnosis of SARS-CoV-2 by FDA under an Emergency Use Authorization (EUA). This EUA will remain  in effect (meaning this test can be used) for the duration of the COVID-19 declaration under Se ction 564(b)(1) of the Act, 21 U.S.C. section 360bbb-3(b)(1), unless the authorization is terminated or revoked sooner.  Performed at [facility], [place] [address], [place], [place] [address]   MRSA PCR Screening     Status: None   Collection Time: 10/22/20  5:54 AM   Specimen: Nasal Mucosa; Nasopharyngeal  Result Value Ref  Range Status   MRSA by PCR NEGATIVE NEGATIVE Final    Comment:        The GeneXpert MRSA Assay (FDA approved for NASAL specimens only), is one component of a comprehensive MRSA colonization surveillance program. It is not intended to diagnose MRSA infection nor to guide or monitor treatment for MRSA infections. Performed at [facility], [place] [address], [place], [place] [address]          Radiology Studies: IR Bone Tumor(s)RF Ablation  Result Date: 10/26/2020 INDICATION: Severe low back pain secondary to pathologic compression fracture at L2. EXAM: BONE ABLATION WITH BIOPSY AND BALLOON KYPHOPLASTY FOR PAIN RELIEF COMPARISON:  CT lumbosacral spine from a few days ago. MEDICATIONS: As antibiotic prophylaxis, Ancef 2 g IV was ordered pre-procedure and administered intravenously within 1 hour of incision. ANESTHESIA/SEDATION: Mac anesthesia as per Department of Anesthesiology at [place]:  Fluoroscopy Time: 17 minutes 36 seconds (1779 mGy) COMPLICATIONS: None  immediate. PROCEDURE: Following a full explanation of the procedure along with the potential associated complications, an informed witnessed consent was obtained. The patient was placed prone on the fluoroscopic table. The skin overlying the lumbar region was then prepped and draped in the usual sterile fashion. both pedicles at L2 were then infiltrated with 0.25% bupivacaine followed by the advancement of an 11-gauge Jamshidi needle through each pedicle into the posterior one-third at L2. These were then exchanged for a Kyphon advanced osteo introducer system comprised of a working cannula and a Kyphon osteo drill. This combination was then advanced over a Kyphon osteo bone pin until the tip of the Kyphon osteo drill was in the posterior third at L2. At this time, the pins were removed. In a medial trajectory, the combinations were advanced until the tip of the working cannula were inside the posterior  one-third at L2. The osteo drills were removed and a core sample sent for pathologic analysis. Through the working cannulae, a Kyphon bone biopsy device was advanced to within 5 mm of the anterior aspect of L2. A core sample from this was also sent for pathologic analysis. At this time, ablation instrumentation were advanced to within approximately 5 mm of the anterior aspect of L2 via the bipedicular approach. Ablation was then performed over the assigned 15 minutes. Following this, ablation instrumentation was removed from the pedicles. Through the working cannulae, a Kyphon inflatable bone tamp 20 x 3 was advanced through each pedicle and positioned with the distal marker 5 mm from the anterior aspect of L2. Crossing of the midline was seen on the AP projection. At this time, the balloons were expanded using contrast via a Kyphon inflation syringe device via microtubing. Inflations were continued until there was apposition with the superior and the inferior endplates. At this time, methylmethacrylate mixture was reconstituted with Tobramycin in the Kyphon bone mixing device system. This was then loaded onto the Kyphon bone fillers. The balloon was deflated and removed followed by the instillation of 6 bone filler equivalents of methylmethacrylate mixture at L2 with excellent filling in the AP and lateral projections. No extravasation was noted in the disk spaces or posteriorly into the spinal canal. No epidural venous contamination was seen. The working cannula and the bone filler were then retrieved and removed. Hemostasis was achieved at the skin entry sites. The patient's sedation was reversed. Upon recovery, the patient was appropriately responsive, and moving all fours. He was then transferred to the PACU and then to the floor in stable condition. IMPRESSION: 1. Status post fluoroscopic-guided needle placement for deep core bone biopsy at L2. 2. L2 status post bone ablation via bipedicular approach at L2. 3.  Status post vertebral body augmentation using balloon kyphoplasty at L2 as described without event. Electronically Signed   By: Luanne Bras M.D.   On: 10/25/2020 14:51   IR KYPHO LUMBAR INC FX REDUCE BONE BX UNI/BIL CANNULATION INC/IMAGING  Result Date: 10/26/2020 INDICATION: Severe low back pain secondary to pathologic compression fracture at L2. EXAM: BONE ABLATION WITH BIOPSY AND BALLOON KYPHOPLASTY FOR PAIN RELIEF COMPARISON:  CT lumbosacral spine from a few days ago. MEDICATIONS: As antibiotic prophylaxis, Ancef 2 g IV was ordered pre-procedure and administered intravenously within 1 hour of incision. ANESTHESIA/SEDATION: Mac anesthesia as per Department of Anesthesiology at [place]:  Fluoroscopy Time: 17 minutes 36 seconds (6712 mGy) COMPLICATIONS: None immediate. PROCEDURE: Following a full explanation of the procedure along with the potential associated complications, an informed witnessed  consent was obtained. The patient was placed prone on the fluoroscopic table. The skin overlying the lumbar region was then prepped and draped in the usual sterile fashion. both pedicles at L2 were then infiltrated with 0.25% bupivacaine followed by the advancement of an 11-gauge Jamshidi needle through each pedicle into the posterior one-third at L2. These were then exchanged for a Kyphon advanced osteo introducer system comprised of a working cannula and a Kyphon osteo drill. This combination was then advanced over a Kyphon osteo bone pin until the tip of the Kyphon osteo drill was in the posterior third at L2. At this time, the pins were removed. In a medial trajectory, the combinations were advanced until the tip of the working cannula were inside the posterior one-third at L2. The osteo drills were removed and a core sample sent for pathologic analysis. Through the working cannulae, a Kyphon bone biopsy device was advanced to within 5 mm of the anterior aspect of L2. A core  sample from this was also sent for pathologic analysis. At this time, ablation instrumentation were advanced to within approximately 5 mm of the anterior aspect of L2 via the bipedicular approach. Ablation was then performed over the assigned 15 minutes. Following this, ablation instrumentation was removed from the pedicles. Through the working cannulae, a Kyphon inflatable bone tamp 20 x 3 was advanced through each pedicle and positioned with the distal marker 5 mm from the anterior aspect of L2. Crossing of the midline was seen on the AP projection. At this time, the balloons were expanded using contrast via a Kyphon inflation syringe device via microtubing. Inflations were continued until there was apposition with the superior and the inferior endplates. At this time, methylmethacrylate mixture was reconstituted with Tobramycin in the Kyphon bone mixing device system. This was then loaded onto the Kyphon bone fillers. The balloon was deflated and removed followed by the instillation of 6 bone filler equivalents of methylmethacrylate mixture at L2 with excellent filling in the AP and lateral projections. No extravasation was noted in the disk spaces or posteriorly into the spinal canal. No epidural venous contamination was seen. The working cannula and the bone filler were then retrieved and removed. Hemostasis was achieved at the skin entry sites. The patient's sedation was reversed. Upon recovery, the patient was appropriately responsive, and moving all fours. He was then transferred to the PACU and then to the floor in stable condition. IMPRESSION: 1. Status post fluoroscopic-guided needle placement for deep core bone biopsy at L2. 2. L2 status post bone ablation via bipedicular approach at L2. 3. Status post vertebral body augmentation using balloon kyphoplasty at L2 as described without event. Electronically Signed   By: Luanne Bras M.D.   On: 10/25/2020 14:51        Scheduled Meds: .  acetaminophen  1,000 mg Oral TID  . amLODipine  5 mg Oral Daily  . apixaban  5 mg Oral BID  . bisacodyl  10 mg Rectal Once  . brimonidine  1 drop Both Eyes TID  . cholecalciferol  2,000 Units Oral Daily  . dorzolamide  1 drop Both Eyes TID  . feeding supplement  1 Container Oral TID BM  . furosemide  80 mg Oral Daily  . lactulose  20 g Oral Daily  . latanoprost  1 drop Both Eyes QHS  . lidocaine  1 patch Transdermal Q24H  . magnesium oxide  400 mg Oral BID  . multivitamin with minerals  1 tablet Oral Daily  .  nystatin  5 mL Oral QID  . oxyCODONE  10 mg Oral Q12H  . pantoprazole  40 mg Oral Daily  . polyethylene glycol  17 g Oral BID  . potassium chloride SA  40 mEq Oral Daily  . senna  2 tablet Oral BID   Continuous Infusions:    LOS: 7 days    Time spent: 36 minutes spent on chart review, discussion with nursing staff, consultants, updating family and interview/physical exam; more than 50% of that time was spent in counseling and/or coordination of care.    Eric J [place] DO [facility] Available via Epic secure chat 7am-7pm After these hours, please refer to coverage provider listed on [email or web address] 10/26/2020, 10:27 AM

## 2020-10-26 NOTE — Progress Notes (Signed)
Patient transferred to [facility] and will be admitted on the 6th floor room 1603.  Report has been given to [place] with no questions or concerns at this time.  Transportation provided by [employer] with all necessary paperwork provided and patient has been transferred off the floor at this time.

## 2020-10-26 NOTE — Plan of Care (Signed)
  Problem: Education: Goal: Knowledge of General Education information will improve Description: Including pain rating scale, medication(s)/side effects and non-pharmacologic comfort measures 10/26/2020 0008 by Trixie Deis, RN Outcome: Progressing 10/26/2020 0007 by Trixie Deis, RN Outcome: Progressing   Problem: Activity: Goal: Risk for activity intolerance will decrease Outcome: Progressing   Problem: Pain Managment: Goal: General experience of comfort will improve Outcome: Progressing   Problem: Safety: Goal: Ability to remain free from injury will improve Outcome: Progressing   Problem: Skin Integrity: Goal: Risk for impaired skin integrity will decrease Outcome: Progressing

## 2020-10-27 DIAGNOSIS — S32020S Wedge compression fracture of second lumbar vertebra, sequela: Secondary | ICD-10-CM | POA: Diagnosis not present

## 2020-10-27 DIAGNOSIS — Z7901 Long term (current) use of anticoagulants: Secondary | ICD-10-CM | POA: Diagnosis not present

## 2020-10-27 DIAGNOSIS — R918 Other nonspecific abnormal finding of lung field: Secondary | ICD-10-CM | POA: Diagnosis not present

## 2020-10-27 DIAGNOSIS — C7951 Secondary malignant neoplasm of bone: Secondary | ICD-10-CM | POA: Diagnosis not present

## 2020-10-27 LAB — BASIC METABOLIC PANEL
Anion gap: 8 (ref 5–15)
BUN: 26 mg/dL — ABNORMAL HIGH (ref 8–23)
CO2: 28 mmol/L (ref 22–32)
Calcium: 8.8 mg/dL — ABNORMAL LOW (ref 8.9–10.3)
Chloride: 98 mmol/L (ref 98–111)
Creatinine, Ser: 1.15 mg/dL (ref 0.61–1.24)
GFR, Estimated: >60 mL/min (ref >60)
Glucose, Bld: 105 mg/dL — ABNORMAL HIGH (ref 70–99)
Potassium: 4.4 mmol/L (ref 3.5–5.1)
Sodium: 134 mmol/L — ABNORMAL LOW (ref 135–145)

## 2020-10-27 LAB — CBC
HCT: 40.5 % (ref 39.0–52.0)
Hemoglobin: 12.7 g/dL — ABNORMAL LOW (ref 13.0–17.0)
MCH: 31.1 pg (ref 26.0–34.0)
MCHC: 31.4 g/dL (ref 30.0–36.0)
MCV: 99 fL (ref 80.0–100.0)
Platelets: 172 10*3/uL (ref 150–400)
RBC: 4.09 MIL/uL — ABNORMAL LOW (ref 4.22–5.81)
RDW: 14 % (ref 11.5–15.5)
WBC: 7.5 10*3/uL (ref 4.0–10.5)
nRBC: 0 % (ref 0.0–0.2)

## 2020-10-27 LAB — LACTATE DEHYDROGENASE: LDH: 225 U/L — ABNORMAL HIGH (ref 98–192)

## 2020-10-27 NOTE — Progress Notes (Signed)
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  Patient says it, he fell around Christmas time and was having back pain since then.  He follows orthopedics as outpatient, Dr. Ninfa Linden with using lumbar brace without much help.  He denies any urinary's problems.  No numbness or weakness of lower extremities.  Does report decreased appetite over the past 2 months with pretty much bedbound. Hospitalist service was consulted for further evaluation management of intractable pain and lumbar spinous compression with concern for malignancy.    Subjective   Patient seen and examined, denies pain.  Plan for radiation treatment tomorrow.   Assessment/Plan:     1. L2 pathological fracture-patient presented with intractable back pain over several weeks and lumbar spine.  Initial fall was in December 2021.  CT abdomen/pelvis with contrast showed osseous metastatic disease to the right third rib and L2 vertebral body.  Small epidural component noted involving L2 to body which is largely eroded.  Nuclear medicine bone scan on 10/22/2020 showed uptake at L2 vertebral body, right third rib, right seventh rib concerning for osseous metastasis.  Underwent L2 tumor ablation with been kyphoplasty and biopsy per IR, Dr. Estanislado Pandy.  Radiation oncology, palliative care and IR following.  Continue back brace/abdominal binder as needed.  Continue Lidoderm patch, OxyContin 10 mg p.o. every 12 hours, oxycodone 5 mg every 3 hours as needed.  Dilaudid 0.5 mg IV every 3 hours as needed.  Patient was  transferred to [facility] from [facility] for radiation therapy starting Monday per radiation oncology. 2. Squamous cell carcinoma lung-CT AAA chest abdomen/pelvis showed multiple pulmonary masses with right upper lobe mass confluence with soft tissue encasing the right mainstem bronchus resulting in mild narrowing of the mainstem bronchus.  He underwent bronchoscopy by Dr. Valeta Harms on 10/22/2020.  Follow-up biopsy results. 3. Hypertension-blood pressures controlled, continue 5 mg p.o. daily. 4. Chronic diastolic CHF-compensated, continue furosemide 80 mg p.o. daily. 5. Chronic atrial fibrillation/tachybradycardia syndrome-patient is status post pacemaker placement, Eliquis has been resumed.     COVID-19 Labs  Recent Labs    10/25/20 0841 10/26/20 0147 10/27/20 0446  LDH 231* 211* 225*    Lab Results  Component Value Date   SARSCOV2NAA NEGATIVE 10/19/2020   [place] NEGATIVE 05/16/2020     Scheduled medications:   . acetaminophen  1,000 mg Oral TID  . amLODipine  5 mg Oral Daily  . apixaban  5 mg Oral BID  . bisacodyl  10 mg Rectal Once  . brimonidine  1 drop Both Eyes TID  . cholecalciferol  2,000 Units Oral Daily  . dorzolamide  1 drop Both Eyes TID  . feeding supplement  1 Container Oral TID BM  . furosemide  80 mg Oral Daily  . lactulose  20 g Oral Daily  . latanoprost  1 drop Both Eyes QHS  . lidocaine  1 patch Transdermal Q24H  . magnesium oxide  400 mg Oral BID  . multivitamin with minerals  1 tablet Oral Daily  . nystatin  5 mL Oral QID  . oxyCODONE  10 mg Oral Q12H  . pantoprazole  40 mg Oral Daily  .  polyethylene glycol  17 g Oral BID  . potassium chloride SA  40 mEq Oral Daily  . senna  2 tablet Oral BID         CBG: No results for input(s): GLUCAP in the last 168 hours.  SpO2: 97 % O2 Flow Rate (L/min): 0 L/min FiO2 (%): (!) 0 %    CBC: Recent Labs  Lab 10/22/20 0122 10/23/20 0547 10/24/20 0337 10/25/20 0841 10/27/20 0446  WBC 6.7 4.6  5.1 6.9 7.5  HGB 10.8* 11.9* 12.0* 13.0 12.7*  HCT 34.3* 36.3* 36.5* 39.6 40.5  MCV 97.7 95.3 95.5 95.9 99.0  PLT 191 193 178 178 536    Basic Metabolic Panel: Recent Labs  Lab 10/23/20 0547 10/24/20 0337 10/25/20 0841 10/26/20 0147 10/27/20 0446  NA 138 137 135 135 134*  K 3.3* 3.7 3.6 3.9 4.4  CL 99 101 100 102 98  CO2 27 25 24 23 28   GLUCOSE 157* 137* 114* 136* 105*  BUN 25* 25* 22 23 26*  CREATININE 1.08 1.07 1.02 1.04 1.15  CALCIUM 8.1* 8.1* 8.2* 8.4* 8.8*  MG  --  2.2 2.4  --   --      Liver Function Tests: No results for input(s): AST, ALT, ALKPHOS, BILITOT, PROT, ALBUMIN in the last 168 hours.   Antibiotics: Anti-infectives (From admission, onward)   Start     Dose/Rate Route Frequency Ordered Stop   10/24/20 1204  ceFAZolin (ANCEF) 2-4 GM/100ML-% IVPB       Note to Pharmacy: [name]   : cabinet override      10/24/20 1204 10/25/20 0014       DVT prophylaxis: Apixaban  Code Status: Full code  Family Communication: No family at bedside   Consultants:  Radiation oncology  Procedures:      Objective   Vitals:   10/26/20 2012 10/26/20 2100 10/27/20 0035 10/27/20 0518  BP: 104/64 128/80 112/63 136/73  Pulse: 70 60 (!) 59 69  Resp: 15 15  16   Temp: 98 F (36.7 C) 98.9 F (37.2 C) 97.9 F (36.6 C) 98 F (36.7 C)  TempSrc:  Oral Oral Oral  SpO2: 98% 98% 98% 97%  Weight:      Height:        Intake/Output Summary (Last 24 hours) at 10/27/2020 1258 Last data filed at 10/27/2020 0940 Gross per 24 hour  Intake 355 ml  Output 750 ml  Net -395 ml    03/04 1901 - 03/06 0700 In: 240 [P.O.:240] Out: 1800 [Urine:1800]  Filed Weights   10/19/20 1549 10/24/20 1057 10/26/20 1234  Weight: 70.3 kg 70.3 kg 69.2 kg    Physical Examination:   General-appears in no acute distress  Heart-S1-S2, regular, no murmur auscultated  Lungs-clear to auscultation bilaterally, no wheezing or crackles auscultated  Abdomen-soft, nontender, no  organomegaly  Extremities-no edema in the lower extremities  Neuro-alert, oriented x3, no focal deficit noted   Status is: Inpatient  Dispo: The patient is from: Home              Anticipated d/c is to: Home              Anticipated d/c date is: 10/29/2020              Patient currently not medically stable for discharge  Barrier to discharge-awaiting radiation therapy            Data Reviewed:   Recent Results (from the past 240 hour(s))  SARS  CORONAVIRUS 2 (TAT 6-24 HRS) Nasopharyngeal Nasopharyngeal Swab     Status: None   Collection Time: 10/19/20  1:47 PM   Specimen: Nasopharyngeal Swab  Result Value Ref Range Status   SARS Coronavirus 2 NEGATIVE NEGATIVE Final    Comment: (NOTE) SARS-CoV-2 target nucleic acids are NOT DETECTED.  The SARS-CoV-2 RNA is generally detectable in upper and lower respiratory specimens during the acute phase of infection. Negative results do not preclude SARS-CoV-2 infection, do not rule out co-infections with other pathogens, and should not be used as the sole basis for treatment or other patient management decisions. Negative results must be combined with clinical observations, patient history, and epidemiological information. The expected result is Negative.  Fact Sheet for Patients: SugarRoll.be  Fact Sheet for Healthcare Providers: https://www.woods-mathews.com/  This test is not yet approved or cleared by the [place] FDA and  has been authorized for detection and/or diagnosis of SARS-CoV-2 by FDA under an Emergency Use Authorization (EUA). This EUA will remain  in effect (meaning this test can be used) for the duration of the COVID-19 declaration under Se ction 564(b)(1) of the Act, 21 U.S.C. section 360bbb-3(b)(1), unless the authorization is terminated or revoked sooner.  Performed at [facility], [place] [address], [place], [place] [address]   MRSA PCR Screening      Status: None   Collection Time: 10/22/20  5:54 AM   Specimen: Nasal Mucosa; Nasopharyngeal  Result Value Ref Range Status   MRSA by PCR NEGATIVE NEGATIVE Final    Comment:        The GeneXpert MRSA Assay (FDA approved for NASAL specimens only), is one component of a comprehensive MRSA colonization surveillance program. It is not intended to diagnose MRSA infection nor to guide or monitor treatment for MRSA infections. Performed at [facility], [place] [address] [place], [place] [address]     No results for input(s): LIPASE, AMYLASE in the last 168 hours. No results for input(s): AMMONIA in the last 168 hours.  Cardiac Enzymes: No results for input(s): CKTOTAL, CKMB, CKMBINDEX, TROPONINI in the last 168 hours. BNP (last 3 results) No results for input(s): BNP in the last 8760 hours.  ProBNP (last 3 results) Recent Labs    11/10/19 1038 12/08/19 1232 01/30/20 1336  PROBNP 2,698* 2,453* 2,125*    Studies:  No results found.     Oswald Hillock   [facility] If 7PM-7AM, please contact night-coverage at [email or web address], Office  [phone]   10/27/2020, 12:58 PM  LOS: 8 days

## 2020-10-28 ENCOUNTER — Ambulatory Visit
Admit: 2020-10-28 | Discharge: 2020-10-28 | Disposition: A | Discharge status: Home / Self Care | Payer: Medicare Other | Attending: Radiation Oncology | Admitting: Radiation Oncology

## 2020-10-28 ENCOUNTER — Ambulatory Visit
Admit: 2020-10-28 | Discharge: 2020-10-28 | Disposition: A | Discharge status: Home / Self Care | Payer: Medicare Other | Source: Ambulatory Visit | Attending: Radiation Oncology | Admitting: Radiation Oncology

## 2020-10-28 ENCOUNTER — Telehealth: Payer: Self-pay | Admitting: *Deleted

## 2020-10-28 DIAGNOSIS — C7951 Secondary malignant neoplasm of bone: Secondary | ICD-10-CM | POA: Insufficient documentation

## 2020-10-28 DIAGNOSIS — S32020A Wedge compression fracture of second lumbar vertebra, initial encounter for closed fracture: Secondary | ICD-10-CM | POA: Diagnosis not present

## 2020-10-28 DIAGNOSIS — G893 Neoplasm related pain (acute) (chronic): Secondary | ICD-10-CM | POA: Diagnosis not present

## 2020-10-28 DIAGNOSIS — C3431 Malignant neoplasm of lower lobe, right bronchus or lung: Secondary | ICD-10-CM | POA: Diagnosis not present

## 2020-10-28 DIAGNOSIS — Z510 Encounter for antineoplastic radiation therapy: Secondary | ICD-10-CM | POA: Insufficient documentation

## 2020-10-28 DIAGNOSIS — Z7901 Long term (current) use of anticoagulants: Secondary | ICD-10-CM | POA: Diagnosis not present

## 2020-10-28 DIAGNOSIS — C3411 Malignant neoplasm of upper lobe, right bronchus or lung: Secondary | ICD-10-CM | POA: Diagnosis not present

## 2020-10-28 LAB — BASIC METABOLIC PANEL
Anion gap: 10 (ref 5–15)
BUN: 26 mg/dL — ABNORMAL HIGH (ref 8–23)
CO2: 24 mmol/L (ref 22–32)
Calcium: 9 mg/dL (ref 8.9–10.3)
Chloride: 100 mmol/L (ref 98–111)
Creatinine, Ser: 1.07 mg/dL (ref 0.61–1.24)
GFR, Estimated: >60 mL/min (ref >60)
Glucose, Bld: 97 mg/dL (ref 70–99)
Potassium: 4.4 mmol/L (ref 3.5–5.1)
Sodium: 134 mmol/L — ABNORMAL LOW (ref 135–145)

## 2020-10-28 LAB — UPEP/UIFE/LIGHT CHAINS/TP, 24-HR UR
% BETA, Urine: 29.2 %
ALPHA 1 URINE: 8.5 %
Albumin, U: 30.5 %
Alpha 2, Urine: 20.5 %
Free Kappa Lt Chains,Ur: 63.75 mg/L (ref 0.63–113.79)
Free Kappa/Lambda Ratio: 5.83 (ref 1.03–31.76)
Free Lambda Lt Chains,Ur: 10.94 mg/L (ref 0.47–11.77)
GAMMA GLOBULIN URINE: 11.3 %
Total Protein, Urine-Ur/day: 265 mg/24 hr — ABNORMAL HIGH (ref 30–150)
Total Protein, Urine: 22.1 mg/dL
Total Volume: 1200

## 2020-10-28 LAB — LACTATE DEHYDROGENASE: LDH: 259 U/L — ABNORMAL HIGH (ref 98–192)

## 2020-10-28 MED ORDER — CYCLOBENZAPRINE HCL 5 MG PO TABS
5.0000 mg | ORAL_TABLET | Freq: Three times a day (TID) | ORAL | Status: DC | PRN
  Administered 2020-10-28: 5 mg via ORAL
  Filled 2020-10-28: qty 1

## 2020-10-28 NOTE — Telephone Encounter (Signed)
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 No need to delay.   Please let me know if you have any other questions or if there's anything else I can help you with!   Sincerely,  Soyla Dryer, NP  NIR        Previous Messages   ----- Message -----  From: Wilmon Arms, RN  Sent: 10/28/2020  9:27 AM EST  To: Luanne Bras, MD, Gery Pray, MD  Subject: Need Clearance                  Dr. Estanislado Pandy,   We have received a referral from Dr. Marin Olp to treat this patient for radiation to his spine. We see that you completed a L2 tumor ablation and kyphoplasty on 10/24/2020. Dr. Sondra Come wants to know if you have any objections to him providing radiation to this area and if that should be delay for any length of time beyond your procedure. We are planning to provide radiation simulation while he is currently inpatient so as soon as you get get in touch with Dr Sondra Come that would be much appreciated.   Please advise.   Thanks,  Julianne Rice, RN

## 2020-10-28 NOTE — Progress Notes (Signed)
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  Patient says it, he fell around Christmas time and was having back pain since then.  He follows orthopedics as outpatient, Dr. Ninfa Warner with using lumbar brace without much help.  He denies any urinary's problems.  No numbness or weakness of lower extremities.  Does report decreased appetite over the past 2 months with pretty much bedbound. Hospitalist service was consulted for further evaluation management of intractable pain and lumbar spinous compression with concern for malignancy.    Subjective   Patient seen and examined, came back from radiation treatment.  Still complains of back pain.   Assessment/Plan:     1. L2 pathological fracture-patient presented with intractable back pain over several weeks and lumbar spine.  Initial fall was in December 2021.  CT abdomen/pelvis with contrast showed osseous metastatic disease to the right third rib and L2 vertebral body.  Small epidural component noted involving L2 to body which is largely eroded.  Nuclear medicine bone scan on 10/22/2020 showed uptake at L2 vertebral body, right third rib, right seventh rib concerning for osseous metastasis.  Underwent L2 tumor ablation with been kyphoplasty and biopsy per IR, Dr. Estanislado Warner.  Radiation oncology, palliative care and IR following.  Continue back brace/abdominal binder as needed.  Continue Lidoderm patch, OxyContin 10 mg p.o. every 12 hours, oxycodone 5 mg every 3 hours as needed.  Dilaudid 0.5 mg IV every 3 hours as needed.   Patient was transferred to [facility] from [facility] for radiation therapy starting Monday per radiation oncology. 2. Squamous cell carcinoma lung-CT AAA chest abdomen/pelvis showed multiple pulmonary masses with right upper lobe mass confluence with soft tissue encasing the right mainstem bronchus resulting in mild narrowing of the mainstem bronchus.  He underwent bronchoscopy by Dr. Valeta Warner on 10/22/2020.  Follow-up biopsy results. 3. Hypertension-blood pressures controlled, continue 5 mg p.o. daily. 4. Chronic diastolic CHF-compensated, continue furosemide 80 mg p.o. daily. 5. Chronic atrial fibrillation/tachybradycardia syndrome-patient is status post pacemaker placement, Eliquis has been resumed.     COVID-19 Labs  Recent Labs    10/26/20 0147 10/27/20 0446 10/28/20 0522  LDH 211* 225* 259*    Lab Results  Component Value Date   SARSCOV2NAA NEGATIVE 10/19/2020   [place] NEGATIVE 05/16/2020     Scheduled medications:   . acetaminophen  1,000 mg Oral TID  . amLODipine  5 mg Oral Daily  . apixaban  5 mg Oral BID  . bisacodyl  10 mg Rectal Once  . brimonidine  1 drop Both Eyes TID  . cholecalciferol  2,000 Units Oral Daily  . dorzolamide  1 drop Both Eyes TID  . feeding supplement  1 Container Oral TID BM  . furosemide  80 mg Oral Daily  . lactulose  20 g Oral Daily  . latanoprost  1 drop Both Eyes QHS  . lidocaine  1 patch Transdermal Q24H  . magnesium oxide  400 mg Oral BID  . multivitamin with minerals  1 tablet Oral Daily  . nystatin  5 mL Oral QID  . oxyCODONE  10 mg Oral Q12H  . pantoprazole  40 mg Oral  Daily  . polyethylene glycol  17 g Oral BID  . potassium chloride SA  40 mEq Oral Daily  . senna  2 tablet Oral BID         CBG: No results for input(s): GLUCAP in the last 168 hours.  SpO2: 98 % O2 Flow Rate (L/min): 0 L/min FiO2 (%): (!) 0 %    CBC: Recent Labs  Lab 10/22/20 0122 10/23/20 0547 10/24/20 0337 10/25/20 0841 10/27/20 0446   WBC 6.7 4.6 5.1 6.9 7.5  HGB 10.8* 11.9* 12.0* 13.0 12.7*  HCT 34.3* 36.3* 36.5* 39.6 40.5  MCV 97.7 95.3 95.5 95.9 99.0  PLT 191 193 178 178 295    Basic Metabolic Panel: Recent Labs  Lab 10/24/20 0337 10/25/20 0841 10/26/20 0147 10/27/20 0446 10/28/20 0522  NA 137 135 135 134* 134*  K 3.7 3.6 3.9 4.4 4.4  CL 101 100 102 98 100  CO2 25 24 23 28 24   GLUCOSE 284* 114* 136* 105* 97  BUN 25* 22 23 26* 26*  CREATININE 1.07 1.02 1.04 1.15 1.07  CALCIUM 8.1* 8.2* 8.4* 8.8* 9.0  MG 2.2 2.4  --   --   --      Liver Function Tests: No results for input(s): AST, ALT, ALKPHOS, BILITOT, PROT, ALBUMIN in the last 168 hours.   Antibiotics: Anti-infectives (From admission, onward)   Start     Dose/Rate Route Frequency Ordered Stop   10/24/20 1204  ceFAZolin (ANCEF) 2-4 GM/100ML-% IVPB       Note to Pharmacy: Jonathan Warner   : cabinet override      10/24/20 1204 10/25/20 0014       DVT prophylaxis: Apixaban  Code Status: Full code  Family Communication: No family at bedside   Consultants:  Radiation oncology  Procedures:      Objective   Vitals:   10/27/20 0518 10/27/20 1415 10/27/20 2027 10/28/20 0549  BP: 136/73 131/73 134/69 (!) 148/59  Pulse: 69 62 72 74  Resp: 16 17 14 16   Temp: 98 F (36.7 C) 97.6 F (36.4 C) 97.6 F (36.4 C) 97.8 F (36.6 C)  TempSrc: Oral Oral Oral Oral  SpO2: 97% 100% 97% 98%  Weight:      Height:        Intake/Output Summary (Last 24 hours) at 10/28/2020 1408 Last data filed at 10/28/2020 0941 Gross per 24 hour  Intake 360 ml  Output 200 ml  Net 160 ml    03/05 1901 - 03/07 0700 In: 132 [P.O.:595] Out: 500 [Urine:500]  Filed Weights   10/19/20 1549 10/24/20 1057 10/26/20 1234  Weight: 70.3 kg 70.3 kg 69.2 kg    Physical Examination:  General-appears in no acute distress Heart-S1-S2, regular, no murmur auscultated Lungs-clear to auscultation bilaterally, no wheezing or crackles auscultated Abdomen-soft, nontender,  no organomegaly Extremities-no edema in the lower extremities Neuro-alert, oriented x3, no focal deficit noted  Status is: Inpatient  Dispo: The patient is from: Home              Anticipated d/c is to: Home              Anticipated d/c date is: 10/29/2020              Patient currently not medically stable for discharge  Barrier to discharge-getting radiation treatment for pain, will obtain PT evaluation            Data Reviewed:   Recent Results (from the past 240 hour(s))  SARS CORONAVIRUS 2 (TAT 6-24 HRS) Nasopharyngeal Nasopharyngeal Swab     Status: None   Collection Time: 10/19/20  1:47 PM   Specimen: Nasopharyngeal Swab  Result Value Ref Range Status   SARS Coronavirus 2 NEGATIVE NEGATIVE Final    Comment: (NOTE) SARS-CoV-2 target nucleic acids are NOT DETECTED.  The SARS-CoV-2 RNA is generally detectable in upper and lower respiratory specimens during the acute phase of infection. Negative results do not preclude SARS-CoV-2 infection, do not rule out co-infections with other pathogens, and should not be used as the sole basis for treatment or other patient management decisions. Negative results must be combined with clinical observations, patient history, and epidemiological information. The expected result is Negative.  Fact Sheet for Patients: SugarRoll.be  Fact Sheet for Healthcare Providers: https://www.woods-mathews.com/  This test is not yet approved or cleared by the [place] FDA and  has been authorized for detection and/or diagnosis of SARS-CoV-2 by FDA under an Emergency Use Authorization (EUA). This EUA will remain  in effect (meaning this test can be used) for the duration of the COVID-19 declaration under Se ction 564(b)(1) of the Act, 21 U.S.C. section 360bbb-3(b)(1), unless the authorization is terminated or revoked sooner.  Performed at [facility], [place] [address], [place],  [place] [address]   MRSA PCR Screening     Status: None   Collection Time: 10/22/20  5:54 AM   Specimen: Nasal Mucosa; Nasopharyngeal  Result Value Ref Range Status   MRSA by PCR NEGATIVE NEGATIVE Final    Comment:        The GeneXpert MRSA Assay (FDA approved for NASAL specimens only), is one component of a comprehensive MRSA colonization surveillance program. It is not intended to diagnose MRSA infection nor to guide or monitor treatment for MRSA infections. Performed at [facility], [place] [address] [place], [place] [address]     No results for input(s): LIPASE, AMYLASE in the last 168 hours. No results for input(s): AMMONIA in the last 168 hours.  Cardiac Enzymes: No results for input(s): CKTOTAL, CKMB, CKMBINDEX, TROPONINI in the last 168 hours. BNP (last 3 results) No results for input(s): BNP in the last 8760 hours.  ProBNP (last 3 results) Recent Labs    11/10/19 1038 12/08/19 1232 01/30/20 1336  PROBNP 2,698* 2,453* 2,125*    Studies:  No results found.     Oswald Hillock   [facility] If 7PM-7AM, please contact night-coverage at [email or web address], Office  [phone]   10/28/2020, 2:08 PM  LOS: 9 days

## 2020-10-28 NOTE — Anesthesia Postprocedure Evaluation (Signed)
Anesthesia Post Note  Patient: Bryn C Kraeger  Procedure(s) Performed: L2 KYPHOPLASTY (N/A )     Patient location during evaluation: PACU Anesthesia Type: MAC Level of consciousness: awake and alert Pain management: pain level controlled Vital Signs Assessment: post-procedure vital signs reviewed and stable Respiratory status: spontaneous breathing, nonlabored ventilation, respiratory function stable and patient connected to nasal cannula oxygen Cardiovascular status: blood pressure returned to baseline and stable Postop Assessment: no apparent nausea or vomiting Anesthetic complications: no   No complications documented.  Last Vitals:  Vitals:   10/27/20 2027 10/28/20 0549  BP: 134/69 (!) 148/59  Pulse: 72 74  Resp: 14 16  Temp: 36.4 C 36.6 C  SpO2: 97% 98%    Last Pain:  Vitals:   10/28/20 0800  TempSrc:   PainSc: 0-No pain                 Laketra Bowdish

## 2020-10-28 NOTE — Progress Notes (Signed)
Radiation Oncology         [phone] ________________________________  Initial Inpatient Consultation  Name: Jonathan Warner MRN: [id number]  Date: 10/28/2020  DOB: 22-Jan-1932  GU:RKYHC, Jonathan [place], MD  Volanda Napoleon, MD   REFERRING PHYSICIAN: Volanda Napoleon, MD  DIAGNOSIS: The encounter diagnosis was Metastatic cancer to spine [facility]).  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 male who is seen as an inpatient as a courtesy of Dr. Marin Olp for an opinion concerning radiation therapy as part of management for his recently diagnosed metastatic lung cancer. The patient presented with worsening back pain and underwent a CT scan of lumbar spine on 10/17/2020 that showed a pathologic fracture of the L2 vertebral body due to metastatic disease or possibly myeloma. There was extraosseous tumor extension on the right at L2. There was no compromise of the spinal canal. There was a lytic appearance suggesting metastatic carcinoma, however, metastatic prostate cancer was possible, given his history.  The patient presented to the ED and was admitted to the hospital on 10/17/2020. While admitted, he underwent a chest CT scan on 10/20/2020 that showed multiple pulmonary masses, suspicious for synchronous right upper lobe and right lower lobe bronchogenic neoplasms. Right upper lobe mass appeared confluent with a rind of soft tissue, which encased the right mainstem bronchus, approaching within 6 mm of the carina and results in mild narrowing of the mainstem bronchus as well as the central lobar bronchi of the right lung. There was oseous metastatic disease to the right third rib laterally as well as the L2 vertebral body. Small epidural component was noted involving the L2 vertebral body, which was largely eroded. CT scan of head on that same day was stable.   The patient underwent a video bronchoscopy  with electromagnetic navigation on 10/22/2020 under the care of Dr. Valeta Harms. Cytology from the procedure revealed squamous cell carcinoma. Malignant cells consistent with carcinoma were also found in the intermedius bronchus brushing, right upper lobe brushing, and right upper lobe fine needle aspiration.   Bone scan on 10/22/2020 showed uptake at the L2 vertebral body, right seventh rib, and two sites in the right third rib, suspicious for osseous metastases. The uptake at L2 and the lateral right third rib corresponded to the destructive lesions on CT. There were no CT findings seen at the anterior right third rib or lateral right seventh rib sites of bone scan abnormality.  The patient underwent an L2 tumor ablation followed by balloon KP with biopsy on 10/24/2020 under the care of Dr. Estanislado Pandy. Pathology from the procedure revealed metastatic squamous cell carcinoma.   The patient has been followed closely by Dr. Marin Olp and was last seen this morning. Goal at this point is pain control and increasing his mobility. Specimens for molecular analysis were also sent.  PREVIOUS RADIATION THERAPY: Yes patient received definitive radiation therapy to the prostate region for early stage prostate cancer by Dr. Valere Dross.  Stamps in 40 sessions.  Treatments were directed at the prostate and seminal vesicle region.  PAST MEDICAL HISTORY:  Past Medical History:  Diagnosis Date  . AICD (automatic cardioverter/defibrillator) present 04/2003   MDT EnPulse implanted by Dr Leonia Reeves ddd; generator change 05/2012  . Arthritis   . BPH (benign prostatic hyperplasia)   . CAD (coronary artery disease)    s/p CABG 1991  . Carotid artery occlusion   .  Cataract    b/l surgery  . Chronic kidney disease (CKD), stage III (moderate) (HCC)   . Diverticulosis   . DJD (degenerative joint disease)   . DJD (degenerative joint disease)   . GERD (gastroesophageal reflux disease)   . Glaucoma   . Hiatal hernia   . History of  adenomatous polyp of colon 06/07/13   Last colonoscopy 06/27/13 showed small adenoma, no further testing due to advanced age.  [name] History of radiation therapy 07/25/2012-09/20/2012   prostate 7800 cGy 40 sessions, seminal vesicles 5600 cGy 40 sessions  . History of shingles   . Hypercholesteremia   . Hyperlipidemia   . Hypertension   . Inguinal hernia   . Osteomyelitis of forearm, right, acute ([place])    drainage x 2  . Persistent atrial fibrillation ([place])   . Prostate cancer ([place]) 04/27/12   Dr. Eulogio Ditch. Radiation therapy. Adenocarcinoma,gleason=3+4=7, 3+3=6,PSA=4.93  volume=51cc  . Pulmonary nodules    small rlung base nodule 4.47m, scarring in lung bases  . Symptomatic bradycardia    s/p PPM by Dr [name]2004  . 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 BRONCHIAL BIOPSY  10/22/2020   Procedure: BRONCHIAL BIOPSIES;  Surgeon: IGarner Nash DO;  [facility];  Service: Pulmonary;;  . BRONCHIAL BRUSHINGS  10/22/2020   Procedure: BRONCHIAL BRUSHINGS;  Surgeon: IGarner Nash DO;  [facility];  Service: Pulmonary;;  . BRONCHIAL NEEDLE ASPIRATION BIOPSY  10/22/2020   Procedure: BRONCHIAL NEEDLE ASPIRATION BIOPSIES;  Surgeon: IGarner Nash DO;  [facility]  Service: Pulmonary;;  . CARDIAC CATHETERIZATION  06/04/09   left w/noted patent bypass grafts  . CATARACT EXTRACTION W/ INTRAOCULAR LENS IMPLANT  2011  . COLONOSCOPY  12/21/2007   hx polyps, diverticulosis  . COLONOSCOPY WITH PROPOFOL N/A 06/27/2013   Procedure: COLONOSCOPY WITH PROPOFOL;  Surgeon: [name];  [facility];  Service: Endoscopy;  Laterality: N/A;  . CORONARY ARTERY BYPASS GRAFT  1991   x5; prior MI  . ENDOBRONCHIAL ULTRASOUND N/A 10/22/2020   Procedure: ENDOBRONCHIAL ULTRASOUND;  Surgeon: IGarner Nash DO;  [facility]  Service: Pulmonary;  Laterality: N/A;   . IR BONE TUMOR(S)RF ABLATION  10/24/2020  . IR KYPHO LUMBAR INC FX REDUCE BONE BX UNI/BIL CANNULATION INC/IMAGING  10/24/2020  . PACEMAKER GENERATOR CHANGE  05/24/12  . PACEMAKER INSERTION  05/14/03   MDT EnPulse implanted by Dr ELeonia Reevesddd  . PERMANENT PACEMAKER GENERATOR CHANGE N/A 05/24/2012   Procedure: PERMANENT PACEMAKER GENERATOR CHANGE;  Surgeon: [name];  [facility];  Service: Cardiovascular;  Laterality: N/A;  . PROSTATE BIOPSY  04/27/12   Adenocarcinoma,gleason=3+3=6, 3+4,& 4+3,PSA=4.93,volume=51cc  . RADIOLOGY WITH ANESTHESIA N/A 10/24/2020   Procedure: L2 KYPHOPLASTY;  Surgeon: DLuanne Bras MD;  [facility]  Service: Radiology;  Laterality: N/A;  PATIENT SHOULD BE INPT  . TUMOR REMOVAL     skin tumors removed  . VIDEO BRONCHOSCOPY WITH ENDOBRONCHIAL NAVIGATION Right 10/22/2020   Procedure: VIDEO BRONCHOSCOPY WITH ENDOBRONCHIAL NAVIGATION;  Surgeon: IGarner Nash DO;  [facility]  Service: Pulmonary;  Laterality: Right;    FAMILY HISTORY:  Family History  Problem Relation Age of Onset  . Hypertension Mother   . Congestive Heart Failure Mother   . CAD Mother   . Alzheimer's disease Father   . CAD Brother   . Pancreatic cancer Brother   .  CVA Brother   . Hypertension Sister        Has 6 sisters, many suffer from HTN  . Heart attack Brother   . Stroke Brother     SOCIAL HISTORY:  Social History   Tobacco Use  . Smoking status: Former Smoker    Packs/day: 0.50    Years: 10.00    Pack years: 5.00    Types: Cigarettes    Quit date: 08/24/1981    Years since quitting: 39.2  . Smokeless tobacco: Never Used  Vaping Use  . Vaping Use: Never used  Substance Use Topics  . Alcohol use: No  . Drug use: No    ALLERGIES:  Allergies  Allergen Reactions  . Pilocarpine Other (See Comments)    Made him feel very funny feeling.  . Rosuvastatin Other (See Comments)    Knee and leg pain Knee and leg pain    MEDICATIONS:  No  current facility-administered medications for this encounter.   No current outpatient medications on file.   Facility-Administered Medications Ordered in Other Encounters  Medication Dose Route Frequency Provider Last Rate Last Admin  . acetaminophen (TYLENOL) tablet 1,000 mg  1,000 mg Oral TID Knox Royalty, NP   1,000 mg at 10/28/20 1546  . amLODipine (NORVASC) tablet 5 mg  5 mg Oral Daily Wynetta Fines T, MD   5 mg at 10/28/20 1000  . apixaban (ELIQUIS) tablet 5 mg  5 mg Oral BID [place] Eric J, DO   5 mg at 10/28/20 1000  . bisacodyl (DULCOLAX) suppository 10 mg  10 mg Rectal Once Rosezella Rumpf, NP      . brimonidine (ALPHAGAN) 0.2 % ophthalmic solution 1 drop  1 drop Both Eyes TID Wynetta Fines T, MD   1 drop at 10/28/20 1548  . cholecalciferol (VITAMIN D3) tablet 2,000 Units  2,000 Units Oral Daily Wynetta Fines T, MD   2,000 Units at 10/28/20 1000  . cyclobenzaprine (FLEXERIL) tablet 5 mg  5 mg Oral TID PRN Oswald Hillock, MD   5 mg at 10/28/20 1715  . dorzolamide (TRUSOPT) 2 % ophthalmic solution 1 drop  1 drop Both Eyes TID Lequita Halt, MD   1 drop at 10/28/20 1548  . feeding supplement (BOOST / RESOURCE BREEZE) liquid 1 Container  1 Container Oral TID BM [place] Donnamarie Poag, DO   1 Container at 10/28/20 1322  . furosemide (LASIX) tablet 80 mg  80 mg Oral Daily Wynetta Fines T, MD   80 mg at 10/28/20 1000  . HYDROmorphone (DILAUDID) injection 0.5 mg  0.5 mg Intravenous Q3H PRN Cooper, Josseline P, PA-C   0.5 mg at 10/28/20 1711  . iohexol (OMNIPAQUE) 300 MG/ML solution 50 mL  50 mL Per Tube Once PRN Deveshwar, Willaim Rayas, MD      . lactulose (CHRONULAC) 10 GM/15ML solution 20 g  20 g Oral Daily Volanda Napoleon, MD   20 g at 10/27/20 1044  . latanoprost (XALATAN) 0.005 % ophthalmic solution 1 drop  1 drop Both Eyes QHS Wynetta Fines T, MD   1 drop at 10/27/20 2118  . lidocaine (LIDODERM) 5 % 1 patch  1 patch Transdermal Q24H Rosezella Rumpf, NP   1 patch at 10/28/20 [phone]  . magnesium  oxide (MAG-OX) tablet 400 mg  400 mg Oral BID Barb Merino, MD   400 mg at 10/28/20 1000  . multivitamin with minerals tablet 1 tablet  1 tablet Oral Daily [place] Donnamarie Poag, DO  1 tablet at 10/28/20 1000  . nystatin (MYCOSTATIN) 100000 UNIT/ML suspension 500,000 Units  5 mL Oral QID Cooper, Josseline P, PA-C   500,000 Units at 10/28/20 1718  . ondansetron (ZOFRAN) injection 4 mg  4 mg Intravenous Q6H PRN Wynetta Fines T, MD   4 mg at 10/19/20 1738  . oxyCODONE (Oxy IR/ROXICODONE) immediate release tablet 5 mg  5 mg Oral Q3H PRN Cooper, Josseline P, PA-C   5 mg at 10/28/20 1008  . oxyCODONE (OXYCONTIN) 12 hr tablet 10 mg  10 mg Oral Q12H Cooper, Josseline P, PA-C   10 mg at 10/28/20 1000  . pantoprazole (PROTONIX) EC tablet 40 mg  40 mg Oral Daily Wynetta Fines T, MD   40 mg at 10/28/20 1000  . polyethylene glycol (MIRALAX / GLYCOLAX) packet 17 g  17 g Oral BID Volanda Napoleon, MD   17 g at 10/27/20 1044  . potassium chloride SA (KLOR-CON) CR tablet 40 mEq  40 mEq Oral Daily Wynetta Fines T, MD   40 mEq at 10/28/20 1000  . senna (SENOKOT) tablet 17.2 mg  2 tablet Oral BID [place] Eric J, DO   17.2 mg at 10/28/20 1000    REVIEW OF SYSTEMS:  A 10+ POINT REVIEW OF SYSTEMS WAS OBTAINED including neurology, dermatology, psychiatry, cardiac, respiratory, lymph, extremities, GI, GU, musculoskeletal, constitutional, reproductive, HEENT.  The patient reports improvement in his pain from his kyphoplasty.  He has limited ambulation with the assistance of a walker.  He denies any weakness in his lower extremities.  He denies any significant numbness or tingling in his lower extremities.   PHYSICAL EXAM:  General: Alert and oriented, in no acute distress,  lying comfortably in his hospital bed HEENT: Head is normocephalic. Extraocular movements are intact. Oropharynx is clear. Neck: Neck is supple, no palpable cervical or supraclavicular lymphadenopathy. Heart: Regular in rate and rhythm with no murmurs, rubs, or  gallops. Chest: Clear to auscultation bilaterally, with no rhonchi, wheezes, or rales.  Pacemaker in place in the left upper chest Abdomen: Soft, nontender, nondistended, with no rigidity or guarding. Extremities: No cyanosis or edema. Lymphatics: see Neck Exam Skin: No concerning lesions. Musculoskeletal: symmetric strength and muscle tone throughout.  Good plantar and dorsiflexion of both lower extremities.  Patient is able to raise both legs off the hospital bed Neurologic: Cranial nerves II through XII are grossly intact. No obvious focalities. Speech is fluent. Coordination is intact. Psychiatric: Judgment and insight are intact. Affect is appropriate.   ECOG = 3  0 - Asymptomatic (Fully active, able to carry on all predisease activities without restriction)  1 - Symptomatic but completely ambulatory (Restricted in physically strenuous activity but ambulatory and able to carry out work of a light or sedentary nature. For example, light housework, office work)  2 - Symptomatic, <50% in bed during the day (Ambulatory and capable of all self care but unable to carry out any work activities. Up and about more than 50% of waking hours)  3 - Symptomatic, >50% in bed, but not bedbound (Capable of only limited self-care, confined to bed or chair 50% or more of waking hours)  4 - Bedbound (Completely disabled. Cannot carry on any self-care. Totally confined to bed or chair)  5 - Death   Eustace Pen MM, Creech RH, Tormey DC, et al. [phone]). "Toxicity and response criteria of the [employer]". [place] Oncol. 5 (6): 649-55  LABORATORY DATA:  Lab Results  Component Value Date  WBC 7.5 10/27/2020   HGB 12.7 (L) 10/27/2020   HCT 40.5 10/27/2020   MCV 99.0 10/27/2020   PLT 172 10/27/2020   NEUTROABS 5.9 05/16/2020   Lab Results  Component Value Date   NA 134 (L) 10/28/2020   K 4.4 10/28/2020   CL 100 10/28/2020   CO2 24 10/28/2020   GLUCOSE 97 10/28/2020    CREATININE 1.07 10/28/2020   CALCIUM 9.0 10/28/2020      RADIOGRAPHY: CT HEAD W & WO CONTRAST  Result Date: 10/20/2020 CLINICAL DATA:  87 year old male with evidence of metastatic disease on recent CT Chest, Abdomen, and Pelvis. Unable to have MRI. EXAM: CT HEAD WITHOUT AND WITH CONTRAST TECHNIQUE: Contiguous axial images were obtained from the base of the skull through the vertex without and with intravenous contrast CONTRAST:  72m OMNIPAQUE IOHEXOL 300 MG/ML  SOLN COMPARISON:  Plain head CT 05/16/2020. FINDINGS: Brain: Stable cerebral volume. No midline shift, ventriculomegaly, mass effect, evidence of mass lesion, intracranial hemorrhage or evidence of cortically based acute infarction. Stable gray-white matter differentiation throughout the brain. No evidence of vasogenic edema. Small chronic lacunar infarcts suspected in the left caudate (series 5, image 30) and stable. No abnormal enhancement identified. Vascular: Mild Calcified atherosclerosis at the skull base. The major intracranial vascular structures are enhancing as expected. Skull: Stable since September. Small circumscribed sclerotic area in the left parietal bone (series 4, image 68) is probably a benign bone island. No other suspicious osseous lesion. Anterior C1-C2 degeneration incidentally noted. Sinuses/Orbits: Visualized paranasal sinuses and mastoids are stable and well pneumatized. Other: No acute orbit or scalp soft tissue finding. IMPRESSION: No metastatic disease or acute intracranial abnormality identified. Stable Head CT since September. Electronically Signed   By: HGenevie AnnM.D.   On: 10/20/2020 10:32   CT LUMBAR SPINE WO CONTRAST  Result Date: 10/17/2020 CLINICAL DATA:  Lumbar compression fracture. Fall December 2021. History of prostate cancer EXAM: CT LUMBAR SPINE WITHOUT CONTRAST TECHNIQUE: Multidetector CT imaging of the lumbar spine was performed without intravenous contrast administration. Multiplanar CT image  reconstructions were also generated. COMPARISON:  Lumbar radiographs 10/16/2020, 10/02/2020 FINDINGS: Segmentation: Normal Alignment: Mild retrolisthesis L1-2 and L2-3. Mild anterolisthesis L3-4. Vertebrae: Pathologic fracture of L2. No bone lesion on the prior 2012 CT. There is lytic destruction of much of the L2 vertebral body, right greater than left. Tumor extends to the anterior pedicle on the right of L2. There is extraosseous extension of tumor into the right paraspinous soft tissues and right psoas muscle. No other fracture or bone lesion identified in the lumbar spine. Paraspinal and other soft tissues: Paraspinous tumor to the right of the L2 vertebral body compatible with tumor extension. This mass invades the psoas muscle measures approximately 2.5 x 3.3 cm. No retroperitoneal adenopathy Atherosclerotic aorta without aneurysm. Hiatal hernia Disc levels: T12-L1: Mild disc degeneration.  Negative for stenosis L1-2: Mild disc degeneration.  Negative for spinal stenosis L2-3: Mild disc degeneration.  Negative for stenosis. L3-4: Disc degeneration with disc bulging and spurring. Bilateral facet hypertrophy. Mild spinal stenosis L4-5: Disc degeneration with diffuse endplate spurring and bilateral facet hypertrophy. Moderate right subarticular stenosis due to spurring. L5-S1: Disc degeneration and diffuse endplate spurring. Bilateral facet degeneration. Mild to moderate foraminal stenosis bilaterally due to spurring. IMPRESSION: Pathologic fracture L2 vertebral body due to metastatic disease or possibly myeloma. There is extraosseous tumor extension on the right at L2. No compromise of the spinal canal. This has a lytic appearance suggesting metastatic carcinoma however metastatic prostate  cancer is possible. Correlate with PSA. Multilevel degenerative change throughout the lumbar spine. Atherosclerotic aorta Electronically Signed   By: Franchot Gallo M.D.   On: 10/17/2020 08:43   NM Bone Scan Whole  Body  Result Date: 10/22/2020 CLINICAL DATA:  Non-small cell lung cancer, staging, L2 fracture EXAM: NUCLEAR MEDICINE WHOLE BODY BONE SCAN TECHNIQUE: Whole body anterior and posterior images were obtained approximately 3 hours after intravenous injection of radiopharmaceutical. RADIOPHARMACEUTICALS:  21.3 mCi Technetium-98mMDP IV COMPARISON:  05/12/2012 Correlation: CT chest abdomen pelvis 10/20/2020, CT lumbar spine 10/17/2020 FINDINGS: Abnormal tracer uptake at L2 corresponding to metastasis with pathologic fracture on CT. Abnormal tracer uptake at lateral RIGHT seventh rib without accompanying CT finding. Abnormal tracer uptake is seen at the anterior and lateral RIGHT third rib, with the uptake at the lateral RIGHT third rib corresponding to an area of bone destruction on CT series 7, image 53. Uptake at the shoulders, sternoclavicular joints, elbows, wrists, and knees, typically degenerative. No additional worrisome sites of tracer uptake are seen to suggest osseous metastatic disease. Expected urinary tract and soft tissue distribution of tracer. IMPRESSION: Uptake at L2 vertebral body, RIGHT seventh rib, and 2 sites in the RIGHT third rib suspicious for osseous metastases. Uptake at L2 and lateral RIGHT third rib correspond to destructive lesions on CT. No CT findings are seen at the anterior RIGHT third rib or lateral RIGHT seventh rib sites of bone scan abnormality. Electronically Signed   By: MLavonia DanaM.D.   On: 10/22/2020 16:08   CT CHEST ABDOMEN PELVIS W CONTRAST  Result Date: 10/20/2020 CLINICAL DATA:  Low back pain, L2 pathologic fracture. EXAM: CT CHEST, ABDOMEN, AND PELVIS WITH CONTRAST TECHNIQUE: Multidetector CT imaging of the chest, abdomen and pelvis was performed following the standard protocol during bolus administration of intravenous contrast. CONTRAST:  107mOMNIPAQUE IOHEXOL 300 MG/ML  SOLN COMPARISON:  None. FINDINGS: CT CHEST FINDINGS Cardiovascular: Coronary artery bypass  grafting has been performed. Mild global cardiomegaly with particular left atrial enlargement. Left subclavian pacemaker leads are seen within the right atrial appendage and right ventricle toward the apex. No pericardial effusion. The central pulmonary arteries are enlarged in keeping with changes of pulmonary arterial hypertension. There is extensive atherosclerotic calcification within the aortic arch and descending thoracic aorta, particularly at the origin of the arch vasculature. Hemodynamically significant stenoses of the innominate and left common carotid artery may be present, but are not well characterized on this non arteriographic study. No aortic aneurysm. Mediastinum/Nodes: There is no pathologic thoracic adenopathy. There is infiltrative soft tissue encasing the right mainstem bronchus 2 within 6 mm of the carina. Moderate hiatal hernia. Debris and contrast within the esophagus may reflect changes of gastroesophageal reflux or esophageal dysmotility. The visualized thyroid is unremarkable. Lungs/Pleura: A spiculated mass is seen within the right upper lobe extending to the hilum measuring at least 4.7 x 4.5 cm at axial image # 61/7 demonstrating retraction of the adjacent visceral pleura. A second rounded mass is seen within the subpleural dependent right lower lobe measuring 2.6 x 4.5 cm at axial image # 117/7. The findings are suspicious for synchronous primary bronchogenic neoplasm. A a small right pleural effusion is present. There is parenchymal scarring within the left upper lobe. Mild subpleural fibrotic change noted within the left lower lobe. The right mainstem bronchus is narrowed by the circumferential soft tissue rind described above. Musculoskeletal: There are erosive changes involving the a right third rib laterally adjacent to the right upper lobe pulmonary  mass in keeping with osseous metastatic disease. CT ABDOMEN PELVIS FINDINGS Hepatobiliary: No focal liver abnormality is seen. No  gallstones, gallbladder wall thickening, or biliary dilatation. Pancreas: Unremarkable Spleen: Unremarkable Adrenals/Urinary Tract: Adrenal glands are unremarkable. Kidneys are normal, without renal calculi, focal lesion, or hydronephrosis. Bladder is unremarkable. Stomach/Bowel: Stomach is within normal limits. Appendix appears normal. No evidence of bowel wall thickening, distention, or inflammatory changes. No free intraperitoneal gas or fluid. Vascular/Lymphatic: Extensive aortoiliac atherosclerotic calcification with particularly prominent atherosclerotic calcification at the renal ostia bilaterally. Extensive calcification within the left common iliac artery proximally. No aortic aneurysm. No pathologic adenopathy within the abdomen and pelvis Reproductive: Brachytherapy seeds are seen within the prostate gland. Seminal vesicles are unremarkable. Other: Tiny bilateral fat containing inguinal hernias are present. Rectum unremarkable. Musculoskeletal: Lytic metastasis to the L2 vertebral body is again identified, better seen on lumbar CTd examination of 10/17/2020 extending into the right paraspinal soft tissues measuring at least 4.5 x 6.0 cm at axial image # 66/3. Small ventral epidural component noted at this level. Malignant disease erodes much of the right lateral aspect of the vertebral body, however, no associated compression deformity is noted at this time. No other lytic or blastic bone lesions are seen. IMPRESSION: Multiple pulmonary masses, as described above, suspicious for synchronous right upper lobe and right lower lobe bronchogenic neoplasms. Right upper lobe mass appears confluent with a rind of soft tissue which encases the right mainstem bronchus, approaching within 6 mm of the carina, and resulting in mild narrowing of the a mainstem bronchus as well as the central lobar bronchi of the right lung. Osseous metastatic disease to the right third rib laterally as well as the L2 vertebral body,  best described on prior CT examination of 10/17/2020. Small epidural component noted involving the L2 vertebral body which is largely eroded. Surgical consultation is advised. Small right pleural effusion. Mild cardiomegaly. Enlargement of the central pulmonary arteries in keeping with pulmonary arterial hypertension. Moderate hiatal hernia with debris within the distal esophagus related to reflux or esophageal dysmotility. Aortic Atherosclerosis (ICD10-I70.0). Electronically Signed   By: Fidela Salisbury MD   On: 10/20/2020 02:11   IR Bone Tumor(s)RF Ablation  Result Date: 10/26/2020 INDICATION: Severe low back pain secondary to pathologic compression fracture at L2. EXAM: BONE ABLATION WITH BIOPSY AND BALLOON KYPHOPLASTY FOR PAIN RELIEF COMPARISON:  CT lumbosacral spine from a few days ago. MEDICATIONS: As antibiotic prophylaxis, Ancef 2 g IV was ordered pre-procedure and administered intravenously within 1 hour of incision. ANESTHESIA/SEDATION: Mac anesthesia as per Department of Anesthesiology at [place]:  Fluoroscopy Time: 17 minutes 36 seconds (1610 mGy) COMPLICATIONS: None immediate. PROCEDURE: Following a full explanation of the procedure along with the potential associated complications, an informed witnessed consent was obtained. The patient was placed prone on the fluoroscopic table. The skin overlying the lumbar region was then prepped and draped in the usual sterile fashion. both pedicles at L2 were then infiltrated with 0.25% bupivacaine followed by the advancement of an 11-gauge Jamshidi needle through each pedicle into the posterior one-third at L2. These were then exchanged for a Kyphon advanced osteo introducer system comprised of a working cannula and a Kyphon osteo drill. This combination was then advanced over a Kyphon osteo bone pin until the tip of the Kyphon osteo drill was in the posterior third at L2. At this time, the pins were removed. In a medial  trajectory, the combinations were advanced until the tip of the working cannula  were inside the posterior one-third at L2. The osteo drills were removed and a core sample sent for pathologic analysis. Through the working cannulae, a Kyphon bone biopsy device was advanced to within 5 mm of the anterior aspect of L2. A core sample from this was also sent for pathologic analysis. At this time, ablation instrumentation were advanced to within approximately 5 mm of the anterior aspect of L2 via the bipedicular approach. Ablation was then performed over the assigned 15 minutes. Following this, ablation instrumentation was removed from the pedicles. Through the working cannulae, a Kyphon inflatable bone tamp 20 x 3 was advanced through each pedicle and positioned with the distal marker 5 mm from the anterior aspect of L2. Crossing of the midline was seen on the AP projection. At this time, the balloons were expanded using contrast via a Kyphon inflation syringe device via microtubing. Inflations were continued until there was apposition with the superior and the inferior endplates. At this time, methylmethacrylate mixture was reconstituted with Tobramycin in the Kyphon bone mixing device system. This was then loaded onto the Kyphon bone fillers. The balloon was deflated and removed followed by the instillation of 6 bone filler equivalents of methylmethacrylate mixture at L2 with excellent filling in the AP and lateral projections. No extravasation was noted in the disk spaces or posteriorly into the spinal canal. No epidural venous contamination was seen. The working cannula and the bone filler were then retrieved and removed. Hemostasis was achieved at the skin entry sites. The patient's sedation was reversed. Upon recovery, the patient was appropriately responsive, and moving all fours. He was then transferred to the PACU and then to the floor in stable condition. IMPRESSION: 1. Status post fluoroscopic-guided needle  placement for deep core bone biopsy at L2. 2. L2 status post bone ablation via bipedicular approach at L2. 3. Status post vertebral body augmentation using balloon kyphoplasty at L2 as described without event. Electronically Signed   By: Luanne Bras M.D.   On: 10/25/2020 14:51   DG CHEST PORT 1 VIEW  Result Date: 10/22/2020 CLINICAL DATA:  Pulmonary masses, status post bronchoscopy with biopsy EXAM: PORTABLE CHEST 1 VIEW COMPARISON:  CT chest 10/20/2020 FINDINGS: Dual lead pacer in place. Prior CABG. Atherosclerotic calcification of the aortic arch. No pneumothorax. Stable appearance of mass in the right upper lobe. Indistinct lingular density compatible with previously seen pleural thickening and scarring. Stable right perihilar fullness likely from adenopathy. No blunting of the right costophrenic angle. Stable mild cardiomegaly. IMPRESSION: 1. No pneumothorax status post bronchoscopy. 2. Stable appearance of the right upper lobe mass and right perihilar adenopathy. 3. Stable mild cardiomegaly. 4. Lingular pleural thickening and scarring. Electronically Signed   By: Van Clines M.D.   On: 10/22/2020 17:09   IR KYPHO LUMBAR INC FX REDUCE BONE BX UNI/BIL CANNULATION INC/IMAGING  Result Date: 10/26/2020 INDICATION: Severe low back pain secondary to pathologic compression fracture at L2. EXAM: BONE ABLATION WITH BIOPSY AND BALLOON KYPHOPLASTY FOR PAIN RELIEF COMPARISON:  CT lumbosacral spine from a few days ago. MEDICATIONS: As antibiotic prophylaxis, Ancef 2 g IV was ordered pre-procedure and administered intravenously within 1 hour of incision. ANESTHESIA/SEDATION: Mac anesthesia as per Department of Anesthesiology at [place]:  Fluoroscopy Time: 17 minutes 36 seconds (0814 mGy) COMPLICATIONS: None immediate. PROCEDURE: Following a full explanation of the procedure along with the potential associated complications, an informed witnessed consent was obtained. The  patient was placed prone on the fluoroscopic table. The skin overlying  the lumbar region was then prepped and draped in the usual sterile fashion. both pedicles at L2 were then infiltrated with 0.25% bupivacaine followed by the advancement of an 11-gauge Jamshidi needle through each pedicle into the posterior one-third at L2. These were then exchanged for a Kyphon advanced osteo introducer system comprised of a working cannula and a Kyphon osteo drill. This combination was then advanced over a Kyphon osteo bone pin until the tip of the Kyphon osteo drill was in the posterior third at L2. At this time, the pins were removed. In a medial trajectory, the combinations were advanced until the tip of the working cannula were inside the posterior one-third at L2. The osteo drills were removed and a core sample sent for pathologic analysis. Through the working cannulae, a Kyphon bone biopsy device was advanced to within 5 mm of the anterior aspect of L2. A core sample from this was also sent for pathologic analysis. At this time, ablation instrumentation were advanced to within approximately 5 mm of the anterior aspect of L2 via the bipedicular approach. Ablation was then performed over the assigned 15 minutes. Following this, ablation instrumentation was removed from the pedicles. Through the working cannulae, a Kyphon inflatable bone tamp 20 x 3 was advanced through each pedicle and positioned with the distal marker 5 mm from the anterior aspect of L2. Crossing of the midline was seen on the AP projection. At this time, the balloons were expanded using contrast via a Kyphon inflation syringe device via microtubing. Inflations were continued until there was apposition with the superior and the inferior endplates. At this time, methylmethacrylate mixture was reconstituted with Tobramycin in the Kyphon bone mixing device system. This was then loaded onto the Kyphon bone fillers. The balloon was deflated and removed followed  by the instillation of 6 bone filler equivalents of methylmethacrylate mixture at L2 with excellent filling in the AP and lateral projections. No extravasation was noted in the disk spaces or posteriorly into the spinal canal. No epidural venous contamination was seen. The working cannula and the bone filler were then retrieved and removed. Hemostasis was achieved at the skin entry sites. The patient's sedation was reversed. Upon recovery, the patient was appropriately responsive, and moving all fours. He was then transferred to the PACU and then to the floor in stable condition. IMPRESSION: 1. Status post fluoroscopic-guided needle placement for deep core bone biopsy at L2. 2. L2 status post bone ablation via bipedicular approach at L2. 3. Status post vertebral body augmentation using balloon kyphoplasty at L2 as described without event. Electronically Signed   By: Luanne Bras M.D.   On: 10/25/2020 14:51   DG HIP UNILAT WITH PELVIS 2-3 VIEWS RIGHT  Result Date: 10/01/2020 CLINICAL DATA:  Right groin pain.  Fall 08/16/2020 EXAM: DG HIP (WITH OR WITHOUT PELVIS) 2-3V RIGHT COMPARISON:  None. FINDINGS: No evidence of acute or healing fracture. Mild right hip joint space narrowing with peripheral acetabular spurring. The pubic rami are intact. Pubic symphysis and sacroiliac joints are congruent. Evidence of a vascular necrosis or focal bone lesion. Moderate vascular calcifications. IMPRESSION: 1. No acute or healing fracture of the right hip. 2. Mild right hip osteoarthritis. Electronically Signed   By: Keith Rake M.D.   On: 10/01/2020 17:24   XR Lumbar Spine 2-3 Views  Result Date: 10/16/2020 2 views of the lumbar spine show severe degenerative changes at multiple levels throughout the lumbar spine.  Compared to previous films, there may be a slight compression  deformity L3.  There is severe degenerative changes between L1 and L2 as well as between L4 and L5 and L5 and S1.  XR Lumbar Spine 2-3  Views  Result Date: 10/02/2020 2 views of the lumbar spine show multiple degenerative changes with the space narrowing and osteophytes throughout the lumbar spine.  There are changes around L2 suggestive of the acute to subacute compression fracture.  There is less than 20% loss of height.  DG C-ARM BRONCHOSCOPY  Result Date: 10/22/2020 C-ARM BRONCHOSCOPY: Fluoroscopy was utilized by the requesting physician.  No radiographic interpretation.      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  This is causing significant pain for the patient and with worsening of disease potentially could result in neurological compromise.  Overall the patient's pain in this area has improved with his kyphoplasty.  There significant soft to extension around the L2 vertebral body which the vertebroplasty would not help.  Today, I talked to the patient about the findings and work-up thus far.  We discussed the natural history of lung cancer and general treatment, highlighting the role of radiotherapy in the management of osseous metastasis.  We discussed the available radiation techniques, and focused on the details of logistics and delivery.  We reviewed the anticipated acute and late sequelae associated with radiation in this setting.  The patient was encouraged to ask questions that I answered to the best of my ability.  A patient consent form was discussed and signed.  We retained a copy for our records.  The patient would like to proceed with radiation and will be scheduled for CT simulation.  PLAN: The patient will proceed with CT simulation later this morning.  We did check with interventional radiology and the patient would be able to go ahead and start his radiation therapy tomorrow as this would not interfere with his recent  kyphoplasty.  Anticipate 10 treatments to the L2 region.  Total time spent in this encounter was 60 minutes which included reviewing the patient's most recent ED visit, admission, CT scans, consultations, bronchoscopy, L2 tumor ablation/biopsy cytology/patholoty reports, follow-ups, physical examination, and documentation.  ------------------------------------------------  Blair Promise, PhD, MD  This document serves as a record of services personally performed by Gery Pray, MD. It was created on his behalf by Clerance Lav, a trained medical scribe. The creation of this record is based on the scribe's personal observations and the provider's statements to them. This document has been checked and approved by the attending provider.

## 2020-10-28 NOTE — Progress Notes (Signed)
Jonathan Warner is now however on 5 E. at [facility]. Hopefully he will start his radiation therapy today.  He says his back pain is better since the kyphoplasty. The pathology on the biopsies that were taken do show metastatic squamous cell carcinoma.  He is on OxyContin oxycodone for pain control.  He is eating a little bit better. He says he still gets full easily.  He is having no problems with bowels or bladder.  I know that physical therapy and Occupational Therapy are going to work hard on him. Hopefully they will be able to increase his mobility.  He is on Eliquis for his atrial fibrillation.  He has had no bleeding. He has had no chest wall pain. He has had no shortness of breath.  On physical exam, his temperature is 97.8. Pulse 74. Blood pressure 148/59. His lungs are clear. Cardiac exam regular rate and rhythm that is well controlled. Abdomen is soft. Bowel sounds are slightly decreased. There is no fluid wave. Extremities she has decent movement of his extremities. Neurological exam shows no focal neurological deficits.  Again, Jonathan Warner has been metastatic squamous cell carcinoma the lung. Our goal is pain control and increasing his mobility. Hopefully, this will happen with radiation.  I did send off specimens for molecular analysis. We will have to see if there is anything with his tumor DNA that we might be able to take advantage of and treat with a pill.  I know that he will get outstanding care by all the staff on 6 E.   Lattie Haw, MD  [place] 14:14

## 2020-10-28 NOTE — Progress Notes (Signed)
PT Cancellation Note  Patient Details Name: Nikolaus JOSEPHANTHONY TINDEL MRN: [id number] DOB: [birth year]   Cancelled Treatment:    Reason Eval/Treat Not Completed: Patient declined, " I just got back". Patient minimally conversive. Will check back another time.   Claretha Cooper 10/28/2020, 10:09 AM [place] Pager [phone] Office [phone]

## 2020-10-28 NOTE — Progress Notes (Signed)
OT Cancellation Note  Patient Details Name: Jonathan Warner MRN: [id number] DOB: [birth year]   Cancelled Treatment:    Reason Eval/Treat Not Completed: Fatigue/lethargy limiting ability to participate: Attempted in morning and afternoon.  In AM, pt very lethargic and declined OT.  In PM, pt more alert, but asked to wait for tomorrow for therapy due to continued fatigue and just getting comfortable.  Pt with family and no needs. Will continue efforts.   Julien Girt 10/28/2020, 2:06 PM

## 2020-10-28 NOTE — Care Management Important Message (Signed)
Important Message  Patient Details IM Letter given to the Patient. Name: Jonathan Warner MRN: [id number] Date of Birth: 02-Apr-1932   Medicare Important Message Given:  Yes     Kerin Salen 10/28/2020, 12:26 PM

## 2020-10-29 ENCOUNTER — Ambulatory Visit
Admit: 2020-10-29 | Discharge: 2020-10-29 | Disposition: A | Discharge status: Home / Self Care | Payer: Medicare Other | Attending: Radiation Oncology | Admitting: Radiation Oncology

## 2020-10-29 DIAGNOSIS — G893 Neoplasm related pain (acute) (chronic): Secondary | ICD-10-CM | POA: Diagnosis not present

## 2020-10-29 DIAGNOSIS — C7951 Secondary malignant neoplasm of bone: Secondary | ICD-10-CM | POA: Diagnosis not present

## 2020-10-29 LAB — BASIC METABOLIC PANEL
Anion gap: 9 (ref 5–15)
BUN: 27 mg/dL — ABNORMAL HIGH (ref 8–23)
CO2: 28 mmol/L (ref 22–32)
Calcium: 9.3 mg/dL (ref 8.9–10.3)
Chloride: 96 mmol/L — ABNORMAL LOW (ref 98–111)
Creatinine, Ser: 1.18 mg/dL (ref 0.61–1.24)
GFR, Estimated: >60 mL/min (ref >60)
Glucose, Bld: 120 mg/dL — ABNORMAL HIGH (ref 70–99)
Potassium: 4.1 mmol/L (ref 3.5–5.1)
Sodium: 133 mmol/L — ABNORMAL LOW (ref 135–145)

## 2020-10-29 LAB — LACTATE DEHYDROGENASE: LDH: 191 U/L (ref 98–192)

## 2020-10-29 MED ORDER — CYCLOBENZAPRINE HCL 5 MG PO TABS
5.0000 mg | ORAL_TABLET | Freq: Three times a day (TID) | ORAL | Status: DC
  Administered 2020-10-29 – 2020-10-31 (×7): 5 mg via ORAL
  Filled 2020-10-29 (×7): qty 1

## 2020-10-29 MED ORDER — OXYCODONE HCL ER 15 MG PO T12A
15.0000 mg | EXTENDED_RELEASE_TABLET | Freq: Two times a day (BID) | ORAL | Status: DC
  Administered 2020-10-29 – 2020-10-31 (×4): 15 mg via ORAL
  Filled 2020-10-29 (×4): qty 1

## 2020-10-29 MED ORDER — DEXAMETHASONE 4 MG PO TABS
4.0000 mg | ORAL_TABLET | Freq: Two times a day (BID) | ORAL | Status: DC
  Administered 2020-10-29 – 2020-10-31 (×5): 4 mg via ORAL
  Filled 2020-10-29 (×5): qty 1

## 2020-10-29 NOTE — Progress Notes (Signed)
                                                                                                                                                                                                         Daily Progress Note   Patient Name: Jazmin C Pilley       Date: 10/29/2020 DOB: 04/13/1932  Age: 88 y.o. MRN#: [id number] Attending Physician: Lama, Gagan S, MD Primary Care Physician: Gates, Robert, MD Admit Date: 10/19/2020  Reason for Consultation/Follow-up: Establishing goals of care  Subjective: Chart reviewed.  Discussed with Nora Clements from transition of care team.  She and I met together in conjunction with patient's wife and also discussed with his son via phone.  Mr. Avans also arrived back in his room from radiation during our encounter, however, he was sleepy and cannot really fully participate in conversation.  We talk with his wife about care plan moving forward.  He is currently started radiation with hopes that this will have better control his pain.  His wife states she is not sure if he is tolerating radiation well and if this is something he wants to continue.  She desperately wants him to be at home and to be as comfortable as possible.  We discussed options for care moving forward including:  1) completion of radiation therapy over the next couple of weeks and follow-up with medical oncology once results of molecular studies are available to discuss potential for trial of systemic disease modifying therapy 2) completing radiation therapy over the next couple of weeks and enrolling in home hospice at the time of completion of radiation (but foregoing any systemic therapy) 3) foregoing further radiation therapy or systemic therapy and transition home with hospice support  She reports that she feels that Mr. Humbarger may just be tired and want to transition home with hospice support, however, this is not something she is directly spoken with him about.  She then called her son who  accompanied him downstairs for radiation and their son (Kevin) seems to think that his father would want to continue with radiation but forego any systemic chemotherapy or immunotherapy.  Overall, we discussed plan to meet tomorrow morning when hopefully Mr. Mounger can participate in conversation to better determine what are his goals moving forward.  Length of Stay: 10  Current Medications: Scheduled Meds:  . acetaminophen  1,000 mg Oral TID  . amLODipine  5 mg Oral Daily  . apixaban  5 mg Oral BID  . bisacodyl  10 mg Rectal Once  . brimonidine  1   drop Both Eyes TID  . cholecalciferol  2,000 Units Oral Daily  . cyclobenzaprine  5 mg Oral TID  . dexamethasone  4 mg Oral Q12H  . dorzolamide  1 drop Both Eyes TID  . feeding supplement  1 Container Oral TID BM  . furosemide  80 mg Oral Daily  . lactulose  20 g Oral Daily  . latanoprost  1 drop Both Eyes QHS  . lidocaine  1 patch Transdermal Q24H  . magnesium oxide  400 mg Oral BID  . multivitamin with minerals  1 tablet Oral Daily  . nystatin  5 mL Oral QID  . oxyCODONE  15 mg Oral Q12H  . pantoprazole  40 mg Oral Daily  . polyethylene glycol  17 g Oral BID  . potassium chloride SA  40 mEq Oral Daily  . senna  2 tablet Oral BID    Continuous Infusions:   PRN Meds: HYDROmorphone (DILAUDID) injection, iohexol, ondansetron (ZOFRAN) IV, oxyCODONE  Physical Exam         General: Sleepy after returning from radiation  HEENT: No bruits, no goiter, no JVD Heart: Regular rate and rhythm. No murmur appreciated. Lungs: Good air movement, clear Abdomen: Soft, nontender, nondistended, positive bowel sounds.  Ext: No significant edema Skin: Warm and dry  Vital Signs: BP 130/73 (BP Location: Left Arm)   Pulse 80   Temp 97.9 F (36.6 C) (Oral)   Resp 18   Ht 5' 7.99" (1.727 m)   Wt 69.2 kg   SpO2 98%   BMI 23.20 kg/m  SpO2: SpO2: 98 % O2 Device: O2 Device: Room Air O2 Flow Rate: O2 Flow Rate (L/min): 0 L/min  Intake/output  summary:   Intake/Output Summary (Last 24 hours) at 10/29/2020 1855 Last data filed at 10/29/2020 1848 Gross per 24 hour  Intake 440 ml  Output 1000 ml  Net -560 ml   LBM: Last BM Date: 10/27/20 Baseline Weight: Weight: 70.3 kg Most recent weight: Weight: 69.2 kg       Palliative Assessment/Data:    Flowsheet Rows   Flowsheet Row Most Recent Value  Intake Tab   Referral Department Hospitalist  Unit at Time of Referral ER  Palliative Care Primary Diagnosis Cancer  Date Notified 10/19/20  Palliative Care Type New Palliative care  Reason for referral Pain, Non-pain Symptom  Date of Admission 10/19/20  Date first seen by Palliative Care 10/20/20  # of days Palliative referral response time 1 Day(s)  # of days IP prior to Palliative referral 0  Clinical Assessment   Psychosocial & Spiritual Assessment   Palliative Care Outcomes       Patient Active Problem List   Diagnosis Date Noted  . Mass of upper lobe of right lung 10/22/2020  . Lung mass   . Metastatic cancer to spine (HCC)   . Palliative care by specialist   . Pathological fracture 10/19/2020  . Impaired ambulation 10/19/2020  . Pathologic compression fracture of vertebra (HCC)   . Unilateral primary osteoarthritis, left knee 04/17/2020  . Unilateral primary osteoarthritis, right knee 04/17/2020  . Coronary artery disease involving native coronary artery of native heart with angina pectoris (HCC) 11/06/2016  . Hypertensive heart and chronic kidney disease with heart failure and stage 1 through stage 4 chronic kidney disease, or chronic kidney disease (HCC) 11/06/2016  . Chronic anticoagulation 12/01/2013  . Goals of care, counseling/discussion 09/20/2013  . S/P bronchoscopy with biopsy   . Prostate cancer (HCC) 05/31/2012  . Bradycardia 05/23/2012  .   Atrial fibrillation (HCC) 05/23/2012    Palliative Care Assessment & Plan   Patient Profile: 88-year-old male with metastatic squamous cell cancer of the  lung  Recommendations/Plan:  DNR/DNI  Discussion today with patient's wife and son regarding goals of care and next steps moving forward.  I did examine Mr. Mangino today, however, he was sleepy after just returning from radiation and was not really able to participate in conversation.  It seems as though family is torn between plan to continue radiation versus work to transition home with hospice support.  I met today with family in conjunction with TOC case manager.  At this point, tentative plan would be to transition home and continue radiation.  Once radiation completes, decision would be whether he would want to potentially pursue disease modifying therapy (molecular studies are pending) versus transition to a focus on comfort with the support of hospice.  We are planning to meet again tomorrow morning between 930 and 10 with the hopes that Mr. Surace can participate in conversation.  Code Status:    Code Status Orders  (From admission, onward)         Start     Ordered   10/25/20 1023  Do not attempt resuscitation (DNR)  Continuous       Question Answer Comment  In the event of cardiac or respiratory ARREST Do not call a "code blue"   In the event of cardiac or respiratory ARREST Do not perform Intubation, CPR, defibrillation or ACLS   In the event of cardiac or respiratory ARREST Use medication by any route, position, wound care, and other measures to relive pain and suffering. May use oxygen, suction and manual treatment of airway obstruction as needed for comfort.      10/25/20 1022        Code Status History    Date Active Date Inactive Code Status Order ID Comments User Context   10/24/2020 1443 10/25/2020 1022 Full Code [id number]  Deveshwar, Sanjeev, MD Inpatient   10/19/2020 1425 10/24/2020 1443 Full Code [id number]  Zhang, Ping T, MD ED   05/24/2012 1619 05/24/2012 2207 Full Code [id number]  Wright, Melanie McCrory, RN Inpatient   Advance Care Planning Activity    Advance Directive  Documentation   Flowsheet Row Most Recent Value  Type of Advance Directive Healthcare Power of Attorney  Pre-existing out of facility DNR order (yellow form or pink MOST form) --  "MOST" Form in Place? --       Prognosis:   Unable to determine  Discharge Planning:  To Be Determined  Care plan was discussed with patient, son, patient's wife  Thank you for allowing the Palliative Medicine Team to assist in the care of this patient.   Time In: 1505 Time Out: 1545 Total Time 40 Prolonged Time Billed No      Greater than 50%  of this time was spent counseling and coordinating care related to the above assessment and plan.  Gene Freeman, MD  Please contact Palliative Medicine Team phone at [phone] for questions and concerns.      

## 2020-10-29 NOTE — Progress Notes (Signed)
Pacemaker form completed and returned.  Patient pacemaker last checked 07/25/2020.  Has [employer] Serial Number [id number].  Device is remotely checked.  Patient is NOT pacemaker dependent.  Cardiologist does NOT request testing prior to treatment, during treatments, or post radiation treatments. Cardiologist DOES request Radiation Oncology to cardiac monitor patient during treatments.  No recommendation for device to be relocated prior to treatment.    Copy to be scanned into Epic.

## 2020-10-29 NOTE — Progress Notes (Addendum)
HEMATOLOGY-ONCOLOGY PROGRESS NOTE  SUBJECTIVE: Jonathan Warner is sitting up and eating breakfast this morning.  Pain is overall well controlled.  He met with radiation oncology yesterday who will give him 10 treatments of radiation to his L2 region.  He denies chest pain or shortness of breath.  He offers no other complaints this morning.  REVIEW OF SYSTEMS:   Constitutional: Denies fevers, chills  Eyes: Denies blurriness of vision Ears, nose, mouth, throat, and face: Denies mucositis or sore throat Respiratory: Denies cough, dyspnea or wheezes Cardiovascular: Denies palpitation, chest discomfort Gastrointestinal:  Denies nausea, heartburn or change in bowel habits Skin: Denies abnormal skin rashes Lymphatics: Denies new lymphadenopathy or easy bruising Neurological:Denies numbness, tingling or new weaknesses Behavioral/Psych: Mood is stable, no new changes  Extremities: No lower extremity edema All other systems were reviewed with the patient and are negative.  I have reviewed the past medical history, past surgical history, social history and family history with the patient and they are unchanged from previous note.   PHYSICAL EXAMINATION: ECOG PERFORMANCE STATUS: 2 - Symptomatic, <50% confined to bed  Vitals:   10/28/20 0549 10/28/20 2043  BP: (!) 148/59 130/73  Pulse: 74 80  Resp: 16 18  Temp: 97.8 F (36.6 C) 97.9 F (36.6 C)  SpO2: 98%    Filed Weights   10/19/20 1549 10/24/20 1057 10/26/20 1234  Weight: 70.3 kg 70.3 kg 69.2 kg    Intake/Output from previous day: 03/07 0701 - 03/08 0700 In: 560 [P.O.:560] Out: 700 [Urine:700]  GENERAL: Elderly male, no distress SKIN: skin color, texture, turgor are normal, no rashes or significant lesions EYES: normal, Conjunctiva are pink and non-injected, sclera clear OROPHARYNX:no exudate, no erythema and lips, buccal mucosa, and tongue normal  LUNGS: clear to auscultation and percussion with normal breathing effort HEART: regular  rate & rhythm and no murmurs and no lower extremity edema ABDOMEN:abdomen soft, non-tender and normal bowel sounds Musculoskeletal:no cyanosis of digits and no clubbing  NEURO: alert & oriented x 3 with fluent speech, no focal motor/sensory deficits  LABORATORY DATA:  I have reviewed the data as listed CMP Latest Ref Rng & Units 10/29/2020 10/28/2020 10/27/2020  Glucose 70 - 99 mg/dL 120(H) 97 105(H)  BUN 8 - 23 mg/dL 27(H) 26(H) 26(H)  Creatinine 0.61 - 1.24 mg/dL 1.18 1.07 1.15  Sodium 135 - 145 mmol/L 133(L) 134(L) 134(L)  Potassium 3.5 - 5.1 mmol/L 4.1 4.4 4.4  Chloride 98 - 111 mmol/L 96(L) 100 98  CO2 22 - 32 mmol/L _0 Calcium 8.9 - 10.3 mg/dL 9.3 9.0 8.8(L)  Total Protein 6.5 - 8.1 g/dL - - -  Total Bilirubin 0.3 - 1.2 mg/dL - - -  Alkaline Phos 38 - 126 U/L - - -  AST 15 - 41 U/L - - -  ALT 0 - 44 U/L - - -    Lab Results  Component Value Date   WBC 7.5 10/27/2020   HGB 12.7 (L) 10/27/2020   HCT 40.5 10/27/2020   MCV 99.0 10/27/2020   PLT 172 10/27/2020   NEUTROABS 5.9 05/16/2020    CT HEAD W & WO CONTRAST  Result Date: 10/20/2020 CLINICAL DATA:  87 year old male with evidence of metastatic disease on recent CT Chest, Abdomen, and Pelvis. Unable to have MRI. EXAM: CT HEAD WITHOUT AND WITH CONTRAST TECHNIQUE: Contiguous axial images were obtained from the base of the skull through the vertex without and with intravenous contrast CONTRAST:  47m OMNIPAQUE IOHEXOL 300 MG/ML  SOLN COMPARISON:  Plain head CT 05/16/2020. FINDINGS: Brain: Stable cerebral volume. No midline shift, ventriculomegaly, mass effect, evidence of mass lesion, intracranial hemorrhage or evidence of cortically based acute infarction. Stable gray-white matter differentiation throughout the brain. No evidence of vasogenic edema. Small chronic lacunar infarcts suspected in the left caudate (series 5, image 30) and stable. No abnormal enhancement identified. Vascular: Mild Calcified atherosclerosis at the  skull base. The major intracranial vascular structures are enhancing as expected. Skull: Stable since September. Small circumscribed sclerotic area in the left parietal bone (series 4, image 68) is probably a benign bone island. No other suspicious osseous lesion. Anterior C1-C2 degeneration incidentally noted. Sinuses/Orbits: Visualized paranasal sinuses and mastoids are stable and well pneumatized. Other: No acute orbit or scalp soft tissue finding. IMPRESSION: No metastatic disease or acute intracranial abnormality identified. Stable Head CT since September. Electronically Signed   By: Genevie Ann M.D.   On: 10/20/2020 10:32   CT LUMBAR SPINE WO CONTRAST  Result Date: 10/17/2020 CLINICAL DATA:  Lumbar compression fracture. Fall December 2021. History of prostate cancer EXAM: CT LUMBAR SPINE WITHOUT CONTRAST TECHNIQUE: Multidetector CT imaging of the lumbar spine was performed without intravenous contrast administration. Multiplanar CT image reconstructions were also generated. COMPARISON:  Lumbar radiographs 10/16/2020, 10/02/2020 FINDINGS: Segmentation: Normal Alignment: Mild retrolisthesis L1-2 and L2-3. Mild anterolisthesis L3-4. Vertebrae: Pathologic fracture of L2. No bone lesion on the prior 2012 CT. There is lytic destruction of much of the L2 vertebral body, right greater than left. Tumor extends to the anterior pedicle on the right of L2. There is extraosseous extension of tumor into the right paraspinous soft tissues and right psoas muscle. No other fracture or bone lesion identified in the lumbar spine. Paraspinal and other soft tissues: Paraspinous tumor to the right of the L2 vertebral body compatible with tumor extension. This mass invades the psoas muscle measures approximately 2.5 x 3.3 cm. No retroperitoneal adenopathy Atherosclerotic aorta without aneurysm. Hiatal hernia Disc levels: T12-L1: Mild disc degeneration.  Negative for stenosis L1-2: Mild disc degeneration.  Negative for spinal  stenosis L2-3: Mild disc degeneration.  Negative for stenosis. L3-4: Disc degeneration with disc bulging and spurring. Bilateral facet hypertrophy. Mild spinal stenosis L4-5: Disc degeneration with diffuse endplate spurring and bilateral facet hypertrophy. Moderate right subarticular stenosis due to spurring. L5-S1: Disc degeneration and diffuse endplate spurring. Bilateral facet degeneration. Mild to moderate foraminal stenosis bilaterally due to spurring. IMPRESSION: Pathologic fracture L2 vertebral body due to metastatic disease or possibly myeloma. There is extraosseous tumor extension on the right at L2. No compromise of the spinal canal. This has a lytic appearance suggesting metastatic carcinoma however metastatic prostate cancer is possible. Correlate with PSA. Multilevel degenerative change throughout the lumbar spine. Atherosclerotic aorta Electronically Signed   By: Franchot Gallo M.D.   On: 10/17/2020 08:43   NM Bone Scan Whole Body  Result Date: 10/22/2020 CLINICAL DATA:  Non-small cell lung cancer, staging, L2 fracture EXAM: NUCLEAR MEDICINE WHOLE BODY BONE SCAN TECHNIQUE: Whole body anterior and posterior images were obtained approximately 3 hours after intravenous injection of radiopharmaceutical. RADIOPHARMACEUTICALS:  21.3 mCi Technetium-60mMDP IV COMPARISON:  05/12/2012 Correlation: CT chest abdomen pelvis 10/20/2020, CT lumbar spine 10/17/2020 FINDINGS: Abnormal tracer uptake at L2 corresponding to metastasis with pathologic fracture on CT. Abnormal tracer uptake at lateral RIGHT seventh rib without accompanying CT finding. Abnormal tracer uptake is seen at the anterior and lateral RIGHT third rib, with the uptake at the lateral RIGHT third rib corresponding to an  area of bone destruction on CT series 7, image 53. Uptake at the shoulders, sternoclavicular joints, elbows, wrists, and knees, typically degenerative. No additional worrisome sites of tracer uptake are seen to suggest osseous  metastatic disease. Expected urinary tract and soft tissue distribution of tracer. IMPRESSION: Uptake at L2 vertebral body, RIGHT seventh rib, and 2 sites in the RIGHT third rib suspicious for osseous metastases. Uptake at L2 and lateral RIGHT third rib correspond to destructive lesions on CT. No CT findings are seen at the anterior RIGHT third rib or lateral RIGHT seventh rib sites of bone scan abnormality. Electronically Signed   By: Lavonia Dana M.D.   On: 10/22/2020 16:08   CT CHEST ABDOMEN PELVIS W CONTRAST  Result Date: 10/20/2020 CLINICAL DATA:  Low back pain, L2 pathologic fracture. EXAM: CT CHEST, ABDOMEN, AND PELVIS WITH CONTRAST TECHNIQUE: Multidetector CT imaging of the chest, abdomen and pelvis was performed following the standard protocol during bolus administration of intravenous contrast. CONTRAST:  174m OMNIPAQUE IOHEXOL 300 MG/ML  SOLN COMPARISON:  None. FINDINGS: CT CHEST FINDINGS Cardiovascular: Coronary artery bypass grafting has been performed. Mild global cardiomegaly with particular left atrial enlargement. Left subclavian pacemaker leads are seen within the right atrial appendage and right ventricle toward the apex. No pericardial effusion. The central pulmonary arteries are enlarged in keeping with changes of pulmonary arterial hypertension. There is extensive atherosclerotic calcification within the aortic arch and descending thoracic aorta, particularly at the origin of the arch vasculature. Hemodynamically significant stenoses of the innominate and left common carotid artery may be present, but are not well characterized on this non arteriographic study. No aortic aneurysm. Mediastinum/Nodes: There is no pathologic thoracic adenopathy. There is infiltrative soft tissue encasing the right mainstem bronchus 2 within 6 mm of the carina. Moderate hiatal hernia. Debris and contrast within the esophagus may reflect changes of gastroesophageal reflux or esophageal dysmotility. The  visualized thyroid is unremarkable. Lungs/Pleura: A spiculated mass is seen within the right upper lobe extending to the hilum measuring at least 4.7 x 4.5 cm at axial image # 61/7 demonstrating retraction of the adjacent visceral pleura. A second rounded mass is seen within the subpleural dependent right lower lobe measuring 2.6 x 4.5 cm at axial image # 117/7. The findings are suspicious for synchronous primary bronchogenic neoplasm. A a small right pleural effusion is present. There is parenchymal scarring within the left upper lobe. Mild subpleural fibrotic change noted within the left lower lobe. The right mainstem bronchus is narrowed by the circumferential soft tissue rind described above. Musculoskeletal: There are erosive changes involving the a right third rib laterally adjacent to the right upper lobe pulmonary mass in keeping with osseous metastatic disease. CT ABDOMEN PELVIS FINDINGS Hepatobiliary: No focal liver abnormality is seen. No gallstones, gallbladder wall thickening, or biliary dilatation. Pancreas: Unremarkable Spleen: Unremarkable Adrenals/Urinary Tract: Adrenal glands are unremarkable. Kidneys are normal, without renal calculi, focal lesion, or hydronephrosis. Bladder is unremarkable. Stomach/Bowel: Stomach is within normal limits. Appendix appears normal. No evidence of bowel wall thickening, distention, or inflammatory changes. No free intraperitoneal gas or fluid. Vascular/Lymphatic: Extensive aortoiliac atherosclerotic calcification with particularly prominent atherosclerotic calcification at the renal ostia bilaterally. Extensive calcification within the left common iliac artery proximally. No aortic aneurysm. No pathologic adenopathy within the abdomen and pelvis Reproductive: Brachytherapy seeds are seen within the prostate gland. Seminal vesicles are unremarkable. Other: Tiny bilateral fat containing inguinal hernias are present. Rectum unremarkable. Musculoskeletal: Lytic  metastasis to the L2 vertebral body is again identified,  better seen on lumbar CTd examination of 10/17/2020 extending into the right paraspinal soft tissues measuring at least 4.5 x 6.0 cm at axial image # 66/3. Small ventral epidural component noted at this level. Malignant disease erodes much of the right lateral aspect of the vertebral body, however, no associated compression deformity is noted at this time. No other lytic or blastic bone lesions are seen. IMPRESSION: Multiple pulmonary masses, as described above, suspicious for synchronous right upper lobe and right lower lobe bronchogenic neoplasms. Right upper lobe mass appears confluent with a rind of soft tissue which encases the right mainstem bronchus, approaching within 6 mm of the carina, and resulting in mild narrowing of the a mainstem bronchus as well as the central lobar bronchi of the right lung. Osseous metastatic disease to the right third rib laterally as well as the L2 vertebral body, best described on prior CT examination of 10/17/2020. Small epidural component noted involving the L2 vertebral body which is largely eroded. Surgical consultation is advised. Small right pleural effusion. Mild cardiomegaly. Enlargement of the central pulmonary arteries in keeping with pulmonary arterial hypertension. Moderate hiatal hernia with debris within the distal esophagus related to reflux or esophageal dysmotility. Aortic Atherosclerosis (ICD10-I70.0). Electronically Signed   By: Fidela Salisbury MD   On: 10/20/2020 02:11   IR Bone Tumor(s)RF Ablation  Result Date: 10/26/2020 INDICATION: Severe low back pain secondary to pathologic compression fracture at L2. EXAM: BONE ABLATION WITH BIOPSY AND BALLOON KYPHOPLASTY FOR PAIN RELIEF COMPARISON:  CT lumbosacral spine from a few days ago. MEDICATIONS: As antibiotic prophylaxis, Ancef 2 g IV was ordered pre-procedure and administered intravenously within 1 hour of incision. ANESTHESIA/SEDATION: Mac anesthesia  as per Department of Anesthesiology at [facility]:  Fluoroscopy Time: 17 minutes 36 seconds (1610 mGy) COMPLICATIONS: None immediate. PROCEDURE: Following a full explanation of the procedure along with the potential associated complications, an informed witnessed consent was obtained. The patient was placed prone on the fluoroscopic table. The skin overlying the lumbar region was then prepped and draped in the usual sterile fashion. both pedicles at L2 were then infiltrated with 0.25% bupivacaine followed by the advancement of an 11-gauge Jamshidi needle through each pedicle into the posterior one-third at L2. These were then exchanged for a Kyphon advanced osteo introducer system comprised of a working cannula and a Kyphon osteo drill. This combination was then advanced over a Kyphon osteo bone pin until the tip of the Kyphon osteo drill was in the posterior third at L2. At this time, the pins were removed. In a medial trajectory, the combinations were advanced until the tip of the working cannula were inside the posterior one-third at L2. The osteo drills were removed and a core sample sent for pathologic analysis. Through the working cannulae, a Kyphon bone biopsy device was advanced to within 5 mm of the anterior aspect of L2. A core sample from this was also sent for pathologic analysis. At this time, ablation instrumentation were advanced to within approximately 5 mm of the anterior aspect of L2 via the bipedicular approach. Ablation was then performed over the assigned 15 minutes. Following this, ablation instrumentation was removed from the pedicles. Through the working cannulae, a Kyphon inflatable bone tamp 20 x 3 was advanced through each pedicle and positioned with the distal marker 5 mm from the anterior aspect of L2. Crossing of the midline was seen on the AP projection. At this time, the balloons were expanded using contrast via a Kyphon inflation  syringe device via  microtubing. Inflations were continued until there was apposition with the superior and the inferior endplates. At this time, methylmethacrylate mixture was reconstituted with Tobramycin in the Kyphon bone mixing device system. This was then loaded onto the Kyphon bone fillers. The balloon was deflated and removed followed by the instillation of 6 bone filler equivalents of methylmethacrylate mixture at L2 with excellent filling in the AP and lateral projections. No extravasation was noted in the disk spaces or posteriorly into the spinal canal. No epidural venous contamination was seen. The working cannula and the bone filler were then retrieved and removed. Hemostasis was achieved at the skin entry sites. The patient's sedation was reversed. Upon recovery, the patient was appropriately responsive, and moving all fours. He was then transferred to the PACU and then to the floor in stable condition. IMPRESSION: 1. Status post fluoroscopic-guided needle placement for deep core bone biopsy at L2. 2. L2 status post bone ablation via bipedicular approach at L2. 3. Status post vertebral body augmentation using balloon kyphoplasty at L2 as described without event. Electronically Signed   By: Luanne Bras M.D.   On: 10/25/2020 14:51   DG CHEST PORT 1 VIEW  Result Date: 10/22/2020 CLINICAL DATA:  Pulmonary masses, status post bronchoscopy with biopsy EXAM: PORTABLE CHEST 1 VIEW COMPARISON:  CT chest 10/20/2020 FINDINGS: Dual lead pacer in place. Prior CABG. Atherosclerotic calcification of the aortic arch. No pneumothorax. Stable appearance of mass in the right upper lobe. Indistinct lingular density compatible with previously seen pleural thickening and scarring. Stable right perihilar fullness likely from adenopathy. No blunting of the right costophrenic angle. Stable mild cardiomegaly. IMPRESSION: 1. No pneumothorax status post bronchoscopy. 2. Stable appearance of the right upper lobe mass and right perihilar  adenopathy. 3. Stable mild cardiomegaly. 4. Lingular pleural thickening and scarring. Electronically Signed   By: Van Clines M.D.   On: 10/22/2020 17:09   IR KYPHO LUMBAR INC FX REDUCE BONE BX UNI/BIL CANNULATION INC/IMAGING  Result Date: 10/26/2020 INDICATION: Severe low back pain secondary to pathologic compression fracture at L2. EXAM: BONE ABLATION WITH BIOPSY AND BALLOON KYPHOPLASTY FOR PAIN RELIEF COMPARISON:  CT lumbosacral spine from a few days ago. MEDICATIONS: As antibiotic prophylaxis, Ancef 2 g IV was ordered pre-procedure and administered intravenously within 1 hour of incision. ANESTHESIA/SEDATION: Mac anesthesia as per Department of Anesthesiology at [facility]:  Fluoroscopy Time: 17 minutes 36 seconds (6629 mGy) COMPLICATIONS: None immediate. PROCEDURE: Following a full explanation of the procedure along with the potential associated complications, an informed witnessed consent was obtained. The patient was placed prone on the fluoroscopic table. The skin overlying the lumbar region was then prepped and draped in the usual sterile fashion. both pedicles at L2 were then infiltrated with 0.25% bupivacaine followed by the advancement of an 11-gauge Jamshidi needle through each pedicle into the posterior one-third at L2. These were then exchanged for a Kyphon advanced osteo introducer system comprised of a working cannula and a Kyphon osteo drill. This combination was then advanced over a Kyphon osteo bone pin until the tip of the Kyphon osteo drill was in the posterior third at L2. At this time, the pins were removed. In a medial trajectory, the combinations were advanced until the tip of the working cannula were inside the posterior one-third at L2. The osteo drills were removed and a core sample sent for pathologic analysis. Through the working cannulae, a Kyphon bone biopsy device was advanced to within 5 mm of  the anterior aspect of L2. A core sample from this  was also sent for pathologic analysis. At this time, ablation instrumentation were advanced to within approximately 5 mm of the anterior aspect of L2 via the bipedicular approach. Ablation was then performed over the assigned 15 minutes. Following this, ablation instrumentation was removed from the pedicles. Through the working cannulae, a Kyphon inflatable bone tamp 20 x 3 was advanced through each pedicle and positioned with the distal marker 5 mm from the anterior aspect of L2. Crossing of the midline was seen on the AP projection. At this time, the balloons were expanded using contrast via a Kyphon inflation syringe device via microtubing. Inflations were continued until there was apposition with the superior and the inferior endplates. At this time, methylmethacrylate mixture was reconstituted with Tobramycin in the Kyphon bone mixing device system. This was then loaded onto the Kyphon bone fillers. The balloon was deflated and removed followed by the instillation of 6 bone filler equivalents of methylmethacrylate mixture at L2 with excellent filling in the AP and lateral projections. No extravasation was noted in the disk spaces or posteriorly into the spinal canal. No epidural venous contamination was seen. The working cannula and the bone filler were then retrieved and removed. Hemostasis was achieved at the skin entry sites. The patient's sedation was reversed. Upon recovery, the patient was appropriately responsive, and moving all fours. He was then transferred to the PACU and then to the floor in stable condition. IMPRESSION: 1. Status post fluoroscopic-guided needle placement for deep core bone biopsy at L2. 2. L2 status post bone ablation via bipedicular approach at L2. 3. Status post vertebral body augmentation using balloon kyphoplasty at L2 as described without event. Electronically Signed   By: Luanne Bras M.D.   On: 10/25/2020 14:51   DG HIP UNILAT WITH PELVIS 2-3 VIEWS RIGHT  Result  Date: 10/01/2020 CLINICAL DATA:  Right groin pain.  Fall 08/16/2020 EXAM: DG HIP (WITH OR WITHOUT PELVIS) 2-3V RIGHT COMPARISON:  None. FINDINGS: No evidence of acute or healing fracture. Mild right hip joint space narrowing with peripheral acetabular spurring. The pubic rami are intact. Pubic symphysis and sacroiliac joints are congruent. Evidence of a vascular necrosis or focal bone lesion. Moderate vascular calcifications. IMPRESSION: 1. No acute or healing fracture of the right hip. 2. Mild right hip osteoarthritis. Electronically Signed   By: Keith Rake M.D.   On: 10/01/2020 17:24   XR Lumbar Spine 2-3 Views  Result Date: 10/16/2020 2 views of the lumbar spine show severe degenerative changes at multiple levels throughout the lumbar spine.  Compared to previous films, there may be a slight compression deformity L3.  There is severe degenerative changes between L1 and L2 as well as between L4 and L5 and L5 and S1.  XR Lumbar Spine 2-3 Views  Result Date: 10/02/2020 2 views of the lumbar spine show multiple degenerative changes with the space narrowing and osteophytes throughout the lumbar spine.  There are changes around L2 suggestive of the acute to subacute compression fracture.  There is less than 20% loss of height.  DG C-ARM BRONCHOSCOPY  Result Date: 10/22/2020 C-ARM BRONCHOSCOPY: Fluoroscopy was utilized by the requesting physician.  No radiographic interpretation.    ASSESSMENT AND PLAN: 1.  Squamous cell carcinoma of the lung 2.  L2 pathologic fracture secondary #1 3.  Hypertension 4.  Chronic diastolic CHF 5.  Chronic atrial fibrillation/tachybradycardia syndrome, status post pacemaker placement  -Molecular analysis currently pending for a squamous cell  carcinoma of the lung.  Once we have these results, we can discuss further treatment options from our standpoint. -For back pain, continue current pain medications.  He will begin radiation today for palliation of his back  pain. -Management of chronic medical conditions per hospitalist.   LOS: 10 days   Jonathan Bussing, DNP, AGPCNP-BC, AOCNP 10/29/20   ADDENDUM: I agree with the above.  He is start his radiation therapy today.  Again, our goal is to alleviate his pain so he will be more functional.  We will await the results of our molecular markers.  Hopefully he will be able to get some physical therapy and be more mobile so we can go home and try to get treatment with radiation as an outpatient.  I do appreciate the outstanding care that he is getting from all the staff up on [facility].      Jonathan Haw, MD  [facility] 6:9

## 2020-10-29 NOTE — Progress Notes (Addendum)
[facility]  PROGRESS NOTE  Jonathan Warner URK:[id number] DOB: June 24, 1932 DOA: 10/19/2020 PCP: Josetta Huddle, MD   Brief HPI:   87 year old male with past medical history of prostate s/p surgical removal and radiation treatment, CAD s/p CABG, chronic atrial fibrillation on Eliquis, tachybradycardia syndrome s/p pacemaker placement, chronic diastolic CHF, recently diagnosed L2 metastasis with pathologic fracture presenting with worsening back pain and ambulatory dysfunction.  Patient says it, he fell around Christmas time and was having back pain since then.  He follows orthopedics as outpatient, Dr. Ninfa Linden with using lumbar brace without much help.  He denies any urinary's problems.  No numbness or weakness of lower extremities.  Does report decreased appetite over the past 2 months with pretty much bedbound. Hospitalist service was consulted for further evaluation management of intractable pain and lumbar spinous compression with concern for malignancy.   Subjective   Patient seen and examined, still complains of severe back pain.  He was started on radiation treatment yesterday.  Plan to give overall 10 treatments for L2  pathological fracture.    Assessment/Plan:    1. L2 pathological fracture-patient presented with intractable back pain over several weeks and lumbar spine.  Initial fall was in December 2021.  CT abdomen/pelvis with contrast showed osseous metastatic disease to the right third rib and L2 vertebral body.  Small epidural component noted involving L2 to body which is largely eroded.  Nuclear medicine bone scan on 10/22/2020 showed uptake at L2 vertebral body, right third rib, right seventh rib concerning for osseous metastasis.  Underwent L2 tumor ablation with been kyphoplasty and biopsy per IR, Dr. Estanislado Pandy.  Radiation oncology, palliative care and IR following.  Continue back brace/abdominal binder as needed.  Continue Lidoderm patch, will increase OxyContin to 15 mg every 12  hours, also start Decadron 4 mg p.o. twice daily for adjuvant pain medication for vertebral metastasis.  Continue oxycodone 5 mg every 3 hours as needed.  Dilaudid 0.5 mg IV every 3 hours as needed.  Patient started on radiation treatment per radiation oncology.  Total 10 treatments planned for L2 pathological fracture. 2. Squamous cell carcinoma lung-CT AAA chest abdomen/pelvis showed multiple pulmonary masses with right upper lobe mass confluence with soft tissue encasing the right mainstem bronchus resulting in mild narrowing of the mainstem bronchus.  He underwent bronchoscopy by Dr. Valeta Harms on 10/22/2020.  Follow-up biopsy results.  Patient to follow-up with oncology as outpatient. 3. Hypertension-blood pressures controlled, continue 5 mg p.o. daily. 4. Chronic diastolic CHF-compensated, continue furosemide 80 mg p.o. daily. 5. Chronic atrial fibrillation/tachybradycardia syndrome-patient is status post pacemaker placement, Eliquis has been resumed. 6. Goals of care-patient's wife says that they are not interested in getting radiation treatments if the pain continues like this.  They would like to go home with hospice.  Will consult palliative care for further help with further clarifications of goals of care.     COVID-19 Labs  Recent Labs    10/27/20 0446 10/28/20 0522 10/29/20 0600  LDH 225* 259* 191    Lab Results  Component Value Date   SARSCOV2NAA NEGATIVE 10/19/2020   [place] NEGATIVE 05/16/2020     Scheduled medications:   . acetaminophen  1,000 mg Oral TID  . amLODipine  5 mg Oral Daily  . apixaban  5 mg Oral BID  . bisacodyl  10 mg Rectal Once  . brimonidine  1 drop Both Eyes TID  . cholecalciferol  2,000 Units Oral Daily  . cyclobenzaprine  5 mg Oral TID  .  dexamethasone  4 mg Oral Q12H  . dorzolamide  1 drop Both Eyes TID  . feeding supplement  1 Container Oral TID BM  . furosemide  80 mg Oral Daily  . lactulose  20 g Oral Daily  . latanoprost  1 drop Both  Eyes QHS  . lidocaine  1 patch Transdermal Q24H  . magnesium oxide  400 mg Oral BID  . multivitamin with minerals  1 tablet Oral Daily  . nystatin  5 mL Oral QID  . oxyCODONE  15 mg Oral Q12H  . pantoprazole  40 mg Oral Daily  . polyethylene glycol  17 g Oral BID  . potassium chloride SA  40 mEq Oral Daily  . senna  2 tablet Oral BID         CBG: No results for input(s): GLUCAP in the last 168 hours.  SpO2: 98 % O2 Flow Rate (L/min): 0 L/min FiO2 (%): (!) 0 %    CBC: Recent Labs  Lab 10/23/20 0547 10/24/20 0337 10/25/20 0841 10/27/20 0446  WBC 4.6 5.1 6.9 7.5  HGB 11.9* 12.0* 13.0 12.7*  HCT 36.3* 36.5* 39.6 40.5  MCV 95.3 95.5 95.9 99.0  PLT 193 178 178 185    Basic Metabolic Panel: Recent Labs  Lab 10/24/20 0337 10/25/20 0841 10/26/20 0147 10/27/20 0446 10/28/20 0522 10/29/20 0600  NA 137 135 135 134* 134* 133*  K 3.7 3.6 3.9 4.4 4.4 4.1  CL 101 100 102 98 100 96*  CO2 25 24 23 28 24 28   GLUCOSE 137* 114* 136* 105* 97 120*  BUN 25* 22 23 26* 26* 27*  CREATININE 1.07 1.02 1.04 1.15 1.07 1.18  CALCIUM 8.1* 8.2* 8.4* 8.8* 9.0 9.3  MG 2.2 2.4  --   --   --   --      Liver Function Tests: No results for input(s): AST, ALT, ALKPHOS, BILITOT, PROT, ALBUMIN in the last 168 hours.   Antibiotics: Anti-infectives (From admission, onward)   Start     Dose/Rate Route Frequency Ordered Stop   10/24/20 1204  ceFAZolin (ANCEF) 2-4 GM/100ML-% IVPB       Note to Pharmacy: [name]   : cabinet override      10/24/20 1204 10/25/20 0014       DVT prophylaxis: Apixaban  Code Status: Full code  Family Communication: Discussed with patient's wife at bedside   Consultants:  Radiation oncology  Procedures:      Objective   Vitals:   10/27/20 1415 10/27/20 2027 10/28/20 0549 10/28/20 2043  BP: 131/73 134/69 (!) 148/59 130/73  Pulse: 62 72 74 80  Resp: 17 14 16 18   Temp: 97.6 F (36.4 C) 97.6 F (36.4 C) 97.8 F (36.6 C) 97.9 F (36.6 C)   TempSrc: Oral Oral Oral Oral  SpO2: 100% 97% 98%   Weight:      Height:        Intake/Output Summary (Last 24 hours) at 10/29/2020 1622 Last data filed at 10/29/2020 1327 Gross per 24 hour  Intake 400 ml  Output 1500 ml  Net -1100 ml    03/06 1901 - 03/08 0700 In: 800 [P.O.:800] Out: 700 [Urine:700]  Filed Weights   10/19/20 1549 10/24/20 1057 10/26/20 1234  Weight: 70.3 kg 70.3 kg 69.2 kg    Physical Examination:  General-appears in moderate distress due to pain Heart-S1-S2, regular, no murmur auscultated Lungs-clear to auscultation bilaterally, no wheezing or crackles auscultated Abdomen-soft, nontender, no organomegaly Extremities-no edema in the  lower extremities Neuro-alert, oriented x3, no focal deficit noted  Status is: Inpatient  Dispo: The patient is from: Home              Anticipated d/c is to: Home              Anticipated d/c date is: 10/31/2020              Patient currently not medically stable for discharge  Barrier to discharge-getting radiation treatment for pain, pending goals of care with palliative care, ongoing pain treatment      Data Reviewed:   Recent Results (from the past 240 hour(s))  MRSA PCR Screening     Status: None   Collection Time: 10/22/20  5:54 AM   Specimen: Nasal Mucosa; Nasopharyngeal  Result Value Ref Range Status   MRSA by PCR NEGATIVE NEGATIVE Final    Comment:        The GeneXpert MRSA Assay (FDA approved for NASAL specimens only), is one component of a comprehensive MRSA colonization surveillance program. It is not intended to diagnose MRSA infection nor to guide or monitor treatment for MRSA infections. Performed at [facility], [place] [address] [place], [place] [address]     No results for input(s): LIPASE, AMYLASE in the last 168 hours. No results for input(s): AMMONIA in the last 168 hours.  Cardiac Enzymes: No results for input(s): CKTOTAL, CKMB, CKMBINDEX, TROPONINI in the last 168 hours. BNP  (last 3 results) No results for input(s): BNP in the last 8760 hours.  ProBNP (last 3 results) Recent Labs    11/10/19 1038 12/08/19 1232 01/30/20 1336  PROBNP 2,698* 2,453* 2,125*    [place]   [facility] If 7PM-7AM, please contact night-coverage at [email or web address], Office  [phone]   10/29/2020, 4:22 PM  LOS: 10 days

## 2020-10-30 ENCOUNTER — Ambulatory Visit
Admit: 2020-10-30 | Discharge: 2020-10-30 | Disposition: A | Discharge status: Home / Self Care | Payer: Medicare Other | Attending: Radiation Oncology | Admitting: Radiation Oncology

## 2020-10-30 LAB — LACTATE DEHYDROGENASE: LDH: 187 U/L (ref 98–192)

## 2020-10-30 LAB — BASIC METABOLIC PANEL
Anion gap: 11 (ref 5–15)
BUN: 26 mg/dL — ABNORMAL HIGH (ref 8–23)
CO2: 25 mmol/L (ref 22–32)
Calcium: 9.5 mg/dL (ref 8.9–10.3)
Chloride: 95 mmol/L — ABNORMAL LOW (ref 98–111)
Creatinine, Ser: 0.94 mg/dL (ref 0.61–1.24)
GFR, Estimated: >60 mL/min (ref >60)
Glucose, Bld: 164 mg/dL — ABNORMAL HIGH (ref 70–99)
Potassium: 4.7 mmol/L (ref 3.5–5.1)
Sodium: 131 mmol/L — ABNORMAL LOW (ref 135–145)

## 2020-10-30 MED ORDER — OXYCODONE HCL 5 MG PO TABS
5.0000 mg | ORAL_TABLET | ORAL | Status: DC | PRN
  Administered 2020-10-30 – 2020-10-31 (×4): 10 mg via ORAL
  Filled 2020-10-30 (×4): qty 2

## 2020-10-30 MED ORDER — SIMETHICONE 40 MG/0.6ML PO SUSP
40.0000 mg | Freq: Four times a day (QID) | ORAL | Status: DC | PRN
  Filled 2020-10-30: qty 0.6

## 2020-10-30 NOTE — TOC Progression Note (Signed)
Transition of Care [facility]) - Progression Note    Patient Details  Name: Jonathan Warner MRN: [id number] Date of Birth: 1932/06/11  Transition of Care [facility]) CM/SW Contact  Kilyn Maragh, Marjie Skiff, RN Phone Number: 10/30/2020, 4:20 PM  Clinical Narrative:    Spoke with Pt along with wife and son [name] at bedside this am with PMT for dc planning. This afternoon I checked back in with pt and family after pt received radiation. He states that today was better than yesterday and he wants to continue outpatient radiation at dc. Discussion was had about home care and how the transition would go from the hospital to eventually hospice after radiation. Plan was made to have [facility] provide home palliative services as a supplement to home health RN and Aide provided by [facility]. Hospital bed was requested and [facility] was contacted for delivery to home. Safe Transport paperwork filled out to schedule transportation to and from radiation once pt is discharged. Will follow up tomorrow to make sure ride is scheduled for Friday radiation.   Expected Discharge Plan: [place] Barriers to Discharge: Continued Medical Work up  Expected Discharge Plan and Services Expected Discharge Plan: [place]   Discharge Planning Services: CM Consult Post Acute Care Choice: [place] arrangements for the past 2 months: Single Family Home                 DME Arranged: Hospital bed DME Agency: [facility] Date DME Agency Contacted: 10/30/20 Time DME Agency Contacted: [phone] Representative spoke with at DME Agency: [place]: RN,Disease Management,Nurse's Aide [place] Agency: [place] Date [place]: 10/30/20 Time [place]: 1619 Representative spoke with at [place]: Levada Dy   Social Determinants of Health ([place]) Interventions    Readmission Risk Interventions No flowsheet data found.

## 2020-10-30 NOTE — Progress Notes (Addendum)
Daily Progress Note   Patient Name: Jonathan Warner       Date: 10/30/2020 DOB: [birth year]  Age: 87 y.o. MRN#: [id number] Attending Physician: Antonieta Pert, MD Primary Care Physician: Josetta Huddle, MD Admit Date: 10/19/2020  Reason for Consultation/Follow-up: Establishing goals of care  Subjective: I met today with Jonathan Warner, his wife, and their son, Jonathan Warner, in conjunction with Marney Doctor from transition of care team.  We discussed clinical course this admission and how he has been doing with onset of radiation.  He understands that the goal of radiation is to help control his symptoms and improve his quality of life.  He reports that he feels he is currently tolerating radiation okay.  We discussed options for care moving forward including:  1) completion of radiation therapy over the next couple of weeks and follow-up with medical oncology once results of molecular studies are available to discuss potential for trial of systemic disease modifying therapy 2) transition home and completing radiation therapy over the next couple of weeks and enrolling in home hospice at the time of completion of radiation (but foregoing any systemic therapy) 3) transition to skilled facility for trial of rehab while completing radiation therapy over the next couple of weeks 4) foregoing further radiation therapy or systemic therapy and transition home with hospice support  He seems to be leaning toward plan to complete radiation from home and considering electing his hospice benefits of completion of radiation.  His family has concerns about his functional status and we talked about potential for transfer to rehab facility for short trial of rehab as well.  He would like to work with physical therapy today and also  complete his radiation today.  Depending upon the results of these, he will make a decision about next steps moving forward.  Length of Stay: 11  Current Medications: Scheduled Meds:  . acetaminophen  1,000 mg Oral TID  . amLODipine  5 mg Oral Daily  . apixaban  5 mg Oral BID  . bisacodyl  10 mg Rectal Once  . brimonidine  1 drop Both Eyes TID  . cholecalciferol  2,000 Units Oral Daily  . cyclobenzaprine  5 mg Oral TID  . dexamethasone  4 mg Oral Q12H  . dorzolamide  1 drop Both Eyes TID  . feeding supplement  1 Container Oral TID BM  . furosemide  80 mg Oral Daily  . lactulose  20 g Oral Daily  . latanoprost  1 drop Both Eyes QHS  . lidocaine  1 patch Transdermal Q24H  . magnesium oxide  400 mg Oral BID  . multivitamin with minerals  1 tablet Oral Daily  . nystatin  5 mL Oral QID  . oxyCODONE  15 mg Oral Q12H  . pantoprazole  40 mg Oral Daily  . polyethylene glycol  17 g Oral BID  . potassium chloride SA  40 mEq Oral Daily  . senna  2 tablet Oral BID    Continuous Infusions:   PRN Meds: HYDROmorphone (DILAUDID) injection, iohexol, ondansetron (ZOFRAN) IV, oxyCODONE, simethicone  Physical Exam         General: Awake, alert, in no distress HEENT: No bruits, no goiter, no JVD Heart: Regular rate and rhythm. No murmur appreciated. Lungs: Good air movement, clear Abdomen: Soft, nontender, nondistended, positive bowel sounds.  Ext: No significant edema Skin: Warm and dry  Vital Signs: BP (!) 148/88   Pulse 77   Temp 97.8 F (36.6 C) (Oral)   Resp 15   Ht 5' 7.99" (1.727 m)   Wt 69.2 kg   SpO2 99%   BMI 23.20 kg/m  SpO2: SpO2: 99 % O2 Device: O2 Device: Room Air O2 Flow Rate: O2 Flow Rate (L/min): 0 L/min  Intake/output summary:   Intake/Output Summary (Last 24 hours) at 10/30/2020 1840 Last data filed at 10/30/2020 1700 Gross per 24 hour  Intake 960 ml  Output 850 ml  Net 110 ml   LBM: Last BM Date: 10/30/20 Baseline Weight: Weight: 70.3 kg Most recent  weight: Weight: 69.2 kg       Palliative Assessment/Data:    Flowsheet Rows   Flowsheet Row Most Recent Value  Intake Tab   Referral Department Hospitalist  Unit at Time of Referral ER  Palliative Care Primary Diagnosis Cancer  Date Notified 10/19/20  Palliative Care Type New Palliative care  Reason for referral Pain, Non-pain Symptom  Date of Admission 10/19/20  Date first seen by Palliative Care 10/20/20  # of days Palliative referral response time 1 Day(s)  # of days IP prior to Palliative referral 0  Clinical Assessment   Psychosocial & Spiritual Assessment   Palliative Care Outcomes       Patient Active Problem List   Diagnosis Date Noted  . Mass of upper lobe of right lung 10/22/2020  . Lung mass   . Metastatic cancer to spine ([place])   . Palliative care by specialist   . Pathological fracture 10/19/2020  . Impaired ambulation 10/19/2020  . Pathologic compression fracture of vertebra ([facility])   . Unilateral primary osteoarthritis, left knee 04/17/2020  . Unilateral primary osteoarthritis, right knee 04/17/2020  . Coronary artery disease involving native coronary artery of native heart with angina pectoris ([place]) 11/06/2016  . Hypertensive heart and chronic kidney disease with heart failure and stage 1 through stage 4 chronic kidney disease, or chronic kidney disease ([place]) 11/06/2016  . Chronic anticoagulation 12/01/2013  . Goals of care, counseling/discussion 09/20/2013  . S/P bronchoscopy with biopsy   . Prostate cancer ([place]) 05/31/2012  . Bradycardia 05/23/2012  . Atrial fibrillation ([place]) 05/23/2012    Palliative Care Assessment & Plan   Patient Profile: 87 year old male with metastatic squamous cell cancer of the lung  Recommendations/Plan:  DNR/DNI  Met today with patient in conjunction with his family.  He seems to  be leaning toward a plan to transition home at time of discharge, complete his remaining radiation therapy, and then electing his hospice  benefits.  Family will discuss further and let transition of care team know their decision later today.  Code Status:    Code Status Orders  (From admission, onward)         Start     Ordered   10/25/20 1023  Do not attempt resuscitation (DNR)  Continuous       Question Answer Comment  In the event of cardiac or respiratory ARREST Do not call a "code blue"   In the event of cardiac or respiratory ARREST Do not perform Intubation, CPR, defibrillation or ACLS   In the event of cardiac or respiratory ARREST Use medication by any route, position, wound care, and other measures to relive pain and suffering. May use oxygen, suction and manual treatment of airway obstruction as needed for comfort.      10/25/20 1022        Code Status History    Date Active Date Inactive Code Status Order ID Comments User Context   10/24/2020 1443 10/25/2020 1022 Full Code [id number]  Luanne Bras, MD Inpatient   10/19/2020 1425 10/24/2020 1443 Full Code [id number]  Lequita Halt, MD ED   05/24/2012 1619 05/24/2012 2207 Full Code [id number]  Jerilee Field, RN Inpatient   Advance Care Planning Activity    Advance Directive Documentation   Flowsheet Row Most Recent Value  Type of Advance Directive Healthcare Power of Attorney  Pre-existing out of facility DNR order (yellow form or pink MOST form) --  "MOST" Form in Place? --       Prognosis:   Unable to determine  Discharge Planning:  To Be Determined  Care plan was discussed with patient, son, patient's wife  Thank you for allowing the Palliative Medicine Team to assist in the care of this patient.   Time In: 0930 Time Out: 1015 Total Time 45 Prolonged Time Billed No   Greater than 50%  of this time was spent counseling and coordinating care related to the above assessment and plan.  Micheline Rough, MD  Please contact Palliative Medicine Team phone at [phone] for questions and concerns.

## 2020-10-30 NOTE — Progress Notes (Cosign Needed Addendum)
    Durable Medical Equipment  (From admission, onward)         Start     Ordered   10/30/20 1552  For home use only DME Hospital bed  Once       Question Answer Comment  Length of Need Lifetime   Patient has (list medical condition): Pathological Fracture secondary to L2 metastatic CA   The above medical condition requires: Patient requires the ability to reposition frequently   Head must be elevated greater than: 30 degrees   Bed type Semi-electric      10/30/20 1553

## 2020-10-30 NOTE — Progress Notes (Signed)
Occupational Therapy Treatment Patient Details Name: Jonathan Warner MRN: [id number] DOB: 09/29/1931 Today's Date: 10/30/2020    History of present illness Jonathan Warner is a 87 y.o. male with medical history significant of remote history of prostate CA status post surgical removal and radiation, CAD s/p CABG, chronic A. fib on Eliquis, tachybradycardia syndrome status post PPM, chronic diastolic CHF, recently diagnosed L2 metastatic CA and pathologic fracture presented with worsening of back pain and ambulation dysfunction. 10/24/20 L2 tumor ablation with vertebroplasty. Plan is for 10 radiation treatments.   OT comments  Patient's pain much improved today and patient able to perform functional mobility with min guard and RW and mod assist for toileting. Patient's son and wife in room and questions answered in regards to safety and how to perform daily tasks at home. Treatment mainly focused on preparing family for patient's return home. Patient tolerated well.    Follow Up Recommendations  Supervision/Assistance - 24 hour    Equipment Recommendations  Hospital bed;Wheelchair (measurements OT)    Recommendations for Other Services      Precautions / Restrictions Precautions Precautions: Fall Required Braces or Orthoses: Other Brace Other Brace: no orders for use of back brace, pt does have LSO in room Restrictions Weight Bearing Restrictions: No       Mobility Bed Mobility               General bed mobility comments: Patient seated at side of bed when therapist entered room.    Transfers   Equipment used: Conservation officer, nature (2 wheeled) Transfers: Sit to/from [employer] to Stand: Min guard Stand pivot transfers: Min guard       General transfer comment: Min guard for standing and short ambulation in room.    Balance Overall balance assessment: Mild deficits observed, not formally tested   Sitting balance-Leahy Scale: Fair     Standing balance  support: During functional activity Standing balance-Leahy Scale: Fair Standing balance comment: reports he feels like he wants to tip forward                           ADL either performed or assessed with clinical judgement   ADL Overall ADL's : Needs assistance/impaired                         Toilet Transfer: Minimal assistance;RW;BSC;Stand-pivot   Toileting- Clothing Manipulation and Hygiene: Moderate assistance;Sit to/from stand Toileting - Clothing Manipulation Details (indicate cue type and reason): Mod assist for toileting for clothing management - patient able to wipe self in seated and then standing position. min guard for safety.             Vision Patient Visual Report: No change from baseline     Perception     Praxis      Cognition Arousal/Alertness: Awake/alert Behavior During Therapy: WFL for tasks assessed/performed Overall Cognitive Status: Within Functional Limits for tasks assessed                                          Exercises     Shoulder Instructions       General Comments      Pertinent Vitals/ Pain       Pain Assessment: No/denies pain  Home Living  Prior Functioning/Environment              Frequency  Min 2X/week        Progress Toward Goals  OT Goals(current goals can now be found in the care plan section)  Progress towards OT goals: Progressing toward goals  Acute Rehab OT Goals Patient Stated Goal: pain relief, return home OT Goal Formulation: With patient Time For Goal Achievement: 11/04/20 Potential to Achieve Goals: [place] Discharge plan needs to be updated    Co-evaluation    PT/OT/SLP Co-Evaluation/Treatment: Yes Reason for Co-Treatment: For patient/therapist safety;To address functional/ADL transfers PT goals addressed during session: Mobility/safety with mobility OT goals addressed during session:  ADL's and self-care      AM-PAC OT "6 Clicks" Daily Activity     Outcome Measure   Help from another person eating meals?: None Help from another person taking care of personal grooming?: A Little Help from another person toileting, which includes using toliet, bedpan, or urinal?: A Lot Help from another person bathing (including washing, rinsing, drying)?: A Lot Help from another person to put on and taking off regular upper body clothing?: A Little Help from another person to put on and taking off regular lower body clothing?: A Lot 6 Click Score: 16    End of Session Equipment Utilized During Treatment: Gait belt;Rolling walker  OT Visit Diagnosis: Other abnormalities of gait and mobility (R26.89);Pain;Muscle weakness (generalized) (M62.81)   Activity Tolerance Patient tolerated treatment well   Patient Left in chair;with call bell/phone within reach;with family/visitor present   Nurse Communication Mobility status        Time: 1610-9604 OT Time Calculation (min): 31 min  Charges: OT General Charges $OT Visit: 1 Visit OT Treatments $Self Care/Home Management : 8-22 mins  Derl Barrow, OTR/L [place]  Office [phone] Pager: [place] 10/30/2020, 1:08 PM

## 2020-10-30 NOTE — Progress Notes (Signed)
PROGRESS NOTE    Jonathan Warner  ZOX:[id number] DOB: 10-16-1931 DOA: 10/19/2020 PCP: Josetta Huddle, MD   Chief Complaint  Patient presents with  . Back Pain  Brief Narrative: 88 year old male with history of prostate cancer CAD status post CABG A. fib on Eliquis admitted with intractable lower back pain secondary to pathological fracture L2 and new diagnosis of SCC right upper lobe followed by hematology oncology, radiation oncology, PCCM, palliative care Patient is status post kyphoplasty 3/3 with biopsy, currently being managed for pain medication with OxyContin opiates lidocaine patch and transferred to [facility] for radiation  Dr. Marin Olp is following goal is to elevate his pain and make him more functional while waiting for molecular markers on the biopsy continue PT OT  Subjective: Alert awake oriented.  Reports pain is stable.  No new complaints. Waiting for radiation treatment today. Assessment & Plan:  Squamous cell carcinoma of the left lung: S/P bronchoscopy with biopsyOncology waiting on molecular markers.  L2 pathological fracture secondary to #1 Intractable back pain Status post nuclear bone scan 3/1, L2 tumor ablation, pain management, Decadron and radiation therapy Overall pain is controlled on OxyContin 40 mg twice daily and OxyIR as needed, IV Dilaudid as needed  Hypertension Chronic diastolic CHF Blood pressure is controlled.  Patient is euvolemic continue Lasix at milligrams daily, amlodipine 5 mg  Chronic A. Fib/tachybradycardia syndrome with pacemaker in place: rate controlled on Eliquis.  Hyponatremia: Monitor  Impaired ambulation/deconditioning back pain continue PT OT  Goals of care palliative care following, DNR patient/family has desire to go home with hospice if pain does not improve  Diet Order            Diet Heart Room service appropriate? Yes; Fluid consistency: Thin  Diet effective now                 Nutrition Problem: Inadequate oral  intake Etiology: decreased appetite Signs/Symptoms: per patient/family report Interventions: Boost Breeze,MVI Patient's Body mass index is 23.2 kg/m.  DVT prophylaxis: eliquis Code Status:   Code Status: DNR  Family Communication: plan of care discussed with patient at bedside.  Status is: Inpatient  Remains inpatient appropriate because:Inpatient level of care appropriate due to severity of illness Dispo:  Patient From: Home  Planned Disposition: Home likely tomorrow if pain is controlled and if okay with his radiation oncology/medical oncology  Medically stable for discharge: No  Unresulted Labs (From admission, onward)          Start     Ordered   10/20/20 0500  Lactate dehydrogenase  Daily,   R      10/19/20 1618   10/20/20 8119  Basic metabolic panel  Daily,   R      10/19/20 1656         Medications reviewed:  Scheduled Meds: . acetaminophen  1,000 mg Oral TID  . amLODipine  5 mg Oral Daily  . apixaban  5 mg Oral BID  . bisacodyl  10 mg Rectal Once  . brimonidine  1 drop Both Eyes TID  . cholecalciferol  2,000 Units Oral Daily  . cyclobenzaprine  5 mg Oral TID  . dexamethasone  4 mg Oral Q12H  . dorzolamide  1 drop Both Eyes TID  . feeding supplement  1 Container Oral TID BM  . furosemide  80 mg Oral Daily  . lactulose  20 g Oral Daily  . latanoprost  1 drop Both Eyes QHS  . lidocaine  1 patch Transdermal Q24H  .  magnesium oxide  400 mg Oral BID  . multivitamin with minerals  1 tablet Oral Daily  . nystatin  5 mL Oral QID  . oxyCODONE  15 mg Oral Q12H  . pantoprazole  40 mg Oral Daily  . polyethylene glycol  17 g Oral BID  . potassium chloride SA  40 mEq Oral Daily  . senna  2 tablet Oral BID   Continuous Infusions:  Consultants:see note  Procedures:see note  Antimicrobials: Anti-infectives (From admission, onward)   Start     Dose/Rate Route Frequency Ordered Stop   10/24/20 1204  ceFAZolin (ANCEF) 2-4 GM/100ML-% IVPB       Note to Pharmacy:  [name]   : cabinet override      10/24/20 1204 10/25/20 0014     Culture/Microbiology No results found for: SDES, SPECREQUEST, CULT, REPTSTATUS  Other culture-see note  Objective: Vitals: Today's Vitals   10/29/20 1405 10/29/20 2009 10/29/20 2130 10/30/20 0536  BP:  (!) 164/83  (!) 148/88  Pulse:  70  77  Resp:  20  15  Temp:  97.8 F (36.6 C)  97.8 F (36.6 C)  TempSrc:  Oral  Oral  SpO2:  99%  99%  Weight:      Height:      PainSc: 6   4      Intake/Output Summary (Last 24 hours) at 10/30/2020 0758 Last data filed at 10/30/2020 0175 Gross per 24 hour  Intake 440 ml  Output 1400 ml  Net -960 ml   Filed Weights   10/19/20 1549 10/24/20 1057 10/26/20 1234  Weight: 70.3 kg 70.3 kg 69.2 kg   Weight change:   Intake/Output from previous day: 03/08 0701 - 03/09 0700 In: 440 [P.O.:440] Out: 1400 [Urine:1400] Intake/Output this shift: No intake/output data recorded. Filed Weights   10/19/20 1549 10/24/20 1057 10/26/20 1234  Weight: 70.3 kg 70.3 kg 69.2 kg    Examination: General exam: AAOx3, pleasant ,NAD, weak appearing. HEENT:Oral mucosa moist, Ear/Nose WNL grossly,dentition normal. Respiratory system: bilaterally diminished,no use of accessory muscle, non tender. Cardiovascular system: S1 & S2 +, regular, No JVD. Gastrointestinal system: Abdomen soft, NT,ND, BS+. Nervous System:Alert, awake, moving extremities and grossly nonfocal Extremities: No edema, distal peripheral pulses palpable.  Skin: No rashes,no icterus. MSK: Normal muscle bulk,tone, power  Data Reviewed: I have personally reviewed following labs and imaging studies CBC: Recent Labs  Lab 10/24/20 0337 10/25/20 0841 10/27/20 0446  WBC 5.1 6.9 7.5  HGB 12.0* 13.0 12.7*  HCT 36.5* 39.6 40.5  MCV 95.5 95.9 99.0  PLT 178 178 102   Basic Metabolic Panel: Recent Labs  Lab 10/24/20 0337 10/25/20 0841 10/26/20 0147 10/27/20 0446 10/28/20 0522 10/29/20 0600 10/30/20 0528  NA 137 135 135  134* 134* 133* 131*  K 3.7 3.6 3.9 4.4 4.4 4.1 4.7  CL 101 100 102 98 100 96* 95*  CO2 25 24 23 28 24 28 25   GLUCOSE 137* 114* 136* 105* 97 120* 164*  BUN 25* 22 23 26* 26* 27* 26*  CREATININE 1.07 1.02 1.04 1.15 1.07 1.18 0.94  CALCIUM 8.1* 8.2* 8.4* 8.8* 9.0 9.3 9.5  MG 2.2 2.4  --   --   --   --   --    GFR: Estimated Creatinine Clearance: 54.6 mL/min (by C-G formula based on SCr of 0.94 mg/dL). Liver Function Tests: No results for input(s): AST, ALT, ALKPHOS, BILITOT, PROT, ALBUMIN in the last 168 hours. No results for input(s): LIPASE, AMYLASE in  the last 168 hours. No results for input(s): AMMONIA in the last 168 hours. Coagulation Profile: No results for input(s): INR, PROTIME in the last 168 hours. Cardiac Enzymes: No results for input(s): CKTOTAL, CKMB, CKMBINDEX, TROPONINI in the last 168 hours. BNP (last 3 results) Recent Labs    11/10/19 1038 12/08/19 1232 01/30/20 1336  PROBNP 2,698* 2,453* 2,125*   HbA1C: No results for input(s): HGBA1C in the last 72 hours. CBG: No results for input(s): GLUCAP in the last 168 hours. Lipid Profile: No results for input(s): CHOL, HDL, LDLCALC, TRIG, CHOLHDL, LDLDIRECT in the last 72 hours. Thyroid Function Tests: No results for input(s): TSH, T4TOTAL, FREET4, T3FREE, THYROIDAB in the last 72 hours. Anemia Panel: No results for input(s): VITAMINB12, FOLATE, FERRITIN, TIBC, IRON, RETICCTPCT in the last 72 hours. Sepsis Labs: No results for input(s): PROCALCITON, LATICACIDVEN in the last 168 hours.  Recent Results (from the past 240 hour(s))  MRSA PCR Screening     Status: None   Collection Time: 10/22/20  5:54 AM   Specimen: Nasal Mucosa; Nasopharyngeal  Result Value Ref Range Status   MRSA by PCR NEGATIVE NEGATIVE Final    Comment:        The GeneXpert MRSA Assay (FDA approved for NASAL specimens only), is one component of a comprehensive MRSA colonization surveillance program. It is not intended to diagnose  MRSA infection nor to guide or monitor treatment for MRSA infections. Performed at [facility], [place] [address] [place], [place] [address]      Radiology Studies: No results found.   LOS: 11 days   Antonieta Pert, MD [facility]  10/30/2020, 7:58 AM

## 2020-10-30 NOTE — Progress Notes (Signed)
Patient was monitored during radiation treatment . Did not show any signs of distress. Heart rate ranged from 80-103.

## 2020-10-30 NOTE — Progress Notes (Addendum)
Manufacturing engineer [facility])  Hospital Liaison: RN note         This patient is currently enrolled in our Palliative progarm. We will continue to follow after discharge and coordinate with hospice services after he completes radiation treatments.     If you have questions or need assistance, please call [phone] or contact the hospital Liaison listed on AMION.      Thank you for this referral.         Farrel Gordon, RN, CCM  [place] (listed on [place] under Hospice/[facility])    [phone]

## 2020-10-30 NOTE — Progress Notes (Signed)
Physical Therapy Treatment Patient Details Name: Jonathan Warner MRN: [id number] DOB: 11-Oct-1931 Today's Date: 10/30/2020    History of Present Illness Jamason C Manheim is a 87 y.o. male with medical history significant of remote history of prostate CA status post surgical removal and radiation, CAD s/p CABG, chronic A. fib on Eliquis, tachybradycardia syndrome status post PPM, chronic diastolic CHF, recently diagnosed L2 metastatic CA and pathologic fracture presented with worsening of back pain and ambulation dysfunction. 10/24/20 L2 tumor ablation with vertebroplasty. Plan is for 10 radiation treatments.    PT Comments    Patient received seated on bed edge, son and wife present.  Patient  Reports no pain in his back. Patient does require increased time for initiating task and following through. MUltimodal cues for use of RW. Patient ambulated x 6' turned and sat on [facility], then to recliner.   Family informed that currently patient will require assistance for ambulation using RW. Continue PT.  Follow Up Recommendations  Home health PT if eligible.     Equipment Recommendations  3in1 (PT);Hospital bed;Rolling walker with 5" wheels    Recommendations for Other Services       Precautions / Restrictions Precautions Precautions: Fall Required Braces or Orthoses: Other Brace Other Brace: no orders for use of back brace, pt does have LSO in room, family says for comfort Restrictions Weight Bearing Restrictions: No    Mobility  Bed Mobility               General bed mobility comments: Patient seated at side of bed when therapist entered room.    Transfers   Equipment used: Rolling walker (2 wheeled) Transfers: Sit to/from Stand Sit to Stand: Min guard Stand pivot transfers: Min guard       General transfer comment: min guard, extra time to process/act on directions  Ambulation/Gait Ambulation/Gait assistance: Min guard Gait Distance (Feet): 6 Feet Assistive device: Rolling  walker (2 wheeled) Gait Pattern/deviations: Step-to pattern;Shuffle Gait velocity: decreased   General Gait Details: Patient taking small steps with RW, cues for redirection for task aof ambulating. Ambulated from [facility] to recliner after very short walk in room   Stairs             Wheelchair Mobility    Modified Rankin (Stroke Patients Only)       Balance Overall balance assessment: Needs assistance   Sitting balance-Leahy Scale: Fair     Standing balance support: During functional activity;Bilateral upper extremity supported Standing balance-Leahy Scale: Fair Standing balance comment: reports he feels like he wants to tip forward, steady assist when standing without UE support, tendency to go backward.                            Cognition Arousal/Alertness: Awake/alert Behavior During Therapy: WFL for tasks assessed/performed Overall Cognitive Status: Impaired/Different from baseline Area of Impairment: Attention;Following commands;Safety/judgement;Awareness;Problem solving                   Current Attention Level: Sustained   Following Commands: Follows one step commands inconsistently Safety/Judgement: Decreased awareness of safety Awareness: Emergent Problem Solving: Decreased initiation;Slow processing;Difficulty sequencing General Comments: Occasionally eyes drifting to closed; a bit slower to initiate, multimodal cues  for safety with RW.      Exercises      General Comments        Pertinent Vitals/Pain Pain Assessment: No/denies pain Faces Pain Scale: No hurt    Home Living  Prior Function            PT Goals (current goals can now be found in the care plan section) Acute Rehab PT Goals Patient Stated Goal: pain relief, return home Progress towards PT goals: Progressing toward goals    Frequency    Min 3X/week      PT Plan Current plan remains appropriate    Co-evaluation    Reason for Co-Treatment: For patient/therapist safety;To address functional/ADL transfers PT goals addressed during session: Mobility/safety with mobility OT goals addressed during session: ADL's and self-care      AM-PAC PT "6 Clicks" Mobility   Outcome Measure                   End of Session Equipment Utilized During Treatment: Gait belt Activity Tolerance: Patient tolerated treatment well Patient left: in chair;with call bell/phone within reach;with chair alarm set;with family/visitor present Nurse Communication: Mobility status PT Visit Diagnosis: Unsteadiness on feet (R26.81);Other abnormalities of gait and mobility (R26.89)     Time: 4388-8757 PT Time Calculation (min) (ACUTE ONLY): 33 min  Charges:  $Gait Training: 8-22 mins                     [place] Pager [phone] Office [phone]    Claretha Cooper 10/30/2020, 1:32 PM

## 2020-10-31 ENCOUNTER — Other Ambulatory Visit (HOSPITAL_COMMUNITY): Payer: Self-pay | Admitting: Internal Medicine

## 2020-10-31 ENCOUNTER — Ambulatory Visit
Admit: 2020-10-31 | Discharge: 2020-10-31 | Disposition: A | Discharge status: Home / Self Care | Payer: Medicare Other | Attending: Radiation Oncology | Admitting: Radiation Oncology

## 2020-10-31 DIAGNOSIS — R6881 Early satiety: Secondary | ICD-10-CM

## 2020-10-31 DIAGNOSIS — R52 Pain, unspecified: Secondary | ICD-10-CM

## 2020-10-31 LAB — BASIC METABOLIC PANEL
Anion gap: 12 (ref 5–15)
BUN: 28 mg/dL — ABNORMAL HIGH (ref 8–23)
CO2: 26 mmol/L (ref 22–32)
Calcium: 9.5 mg/dL (ref 8.9–10.3)
Chloride: 95 mmol/L — ABNORMAL LOW (ref 98–111)
Creatinine, Ser: 1.02 mg/dL (ref 0.61–1.24)
GFR, Estimated: >60 mL/min (ref >60)
Glucose, Bld: 151 mg/dL — ABNORMAL HIGH (ref 70–99)
Potassium: 4.9 mmol/L (ref 3.5–5.1)
Sodium: 133 mmol/L — ABNORMAL LOW (ref 135–145)

## 2020-10-31 LAB — LACTATE DEHYDROGENASE: LDH: 191 U/L (ref 98–192)

## 2020-10-31 MED ORDER — LACTULOSE 10 GM/15ML PO SOLN: 20.0000 g | Freq: Every day | ORAL | 0 refills | Status: AC

## 2020-10-31 MED ORDER — METOCLOPRAMIDE HCL 5 MG PO TABS
5.0000 mg | ORAL_TABLET | Freq: Three times a day (TID) | ORAL | Status: DC
  Administered 2020-10-31 (×2): 5 mg via ORAL
  Filled 2020-10-31 (×2): qty 1

## 2020-10-31 MED ORDER — DEXAMETHASONE 4 MG PO TABS: 4.0000 mg | ORAL_TABLET | Freq: Two times a day (BID) | ORAL | 0 refills | Status: AC

## 2020-10-31 MED ORDER — OXYCODONE HCL ER 15 MG PO T12A: 15.0000 mg | EXTENDED_RELEASE_TABLET | Freq: Two times a day (BID) | ORAL | 0 refills | Status: DC

## 2020-10-31 MED ORDER — LIDOCAINE 5 % EX PTCH
1.0000 | MEDICATED_PATCH | CUTANEOUS | 0 refills | Status: AC
Start: 2020-10-31 — End: ?

## 2020-10-31 MED ORDER — METOCLOPRAMIDE HCL 5 MG PO TABS: 5.0000 mg | ORAL_TABLET | Freq: Three times a day (TID) | ORAL | 0 refills | Status: AC

## 2020-10-31 MED ORDER — NYSTATIN 100000 UNIT/ML MT SUSP: 5.0000 mL | Freq: Four times a day (QID) | OROMUCOSAL | 0 refills | Status: AC

## 2020-10-31 MED ORDER — POLYETHYLENE GLYCOL 3350 17 G PO PACK: 17.0000 g | PACK | Freq: Two times a day (BID) | ORAL | 0 refills | Status: AC

## 2020-10-31 MED ORDER — SENNA 8.6 MG PO TABS: 2.0000 | ORAL_TABLET | Freq: Two times a day (BID) | ORAL | 0 refills | Status: AC

## 2020-10-31 MED ORDER — MAGNESIUM OXIDE 400 (241.3 MG) MG PO TABS: 400.0000 mg | ORAL_TABLET | Freq: Two times a day (BID) | ORAL | 0 refills | Status: AC

## 2020-10-31 MED ORDER — CYCLOBENZAPRINE HCL 5 MG PO TABS: 5.0000 mg | ORAL_TABLET | Freq: Three times a day (TID) | ORAL | 0 refills | Status: AC | PRN

## 2020-10-31 MED ORDER — OXYCODONE HCL 5 MG PO TABS: 5.0000 mg | ORAL_TABLET | Freq: Four times a day (QID) | ORAL | 0 refills | Status: AC | PRN

## 2020-10-31 MED ORDER — SIMETHICONE 40 MG/0.6ML PO SUSP: 40.0000 mg | Freq: Four times a day (QID) | ORAL | 0 refills | Status: AC | PRN

## 2020-10-31 MED FILL — OxyCONTIN 15 MG T12A: 15 | 5 days supply | Qty: 10 | Fill #0

## 2020-10-31 NOTE — Care Management Important Message (Signed)
Important Message  Patient Details IM Letter given to the Patient. Name: Jonathan Warner MRN: [id number] Date of Birth: 1931/12/31   Medicare Important Message Given:  Yes     Kerin Salen 10/31/2020, 10:29 AM

## 2020-10-31 NOTE — TOC Transition Note (Signed)
Transition of Care [facility]) - CM/SW Discharge Note   Patient Details  Name: Jonathan Warner MRN: [id number] Date of Birth: 1932-06-11  [facility] of [facility]) CM/SW Contact:  Lynnell Catalan, RN Phone Number: 10/31/2020, 1:31 PM   Clinical Narrative:    Transportation set up for radiation treatment tomorrow. Patient will be transported by [facility] to radiation and then back home.  [facility] also to transport pt home today. Yellow DNR on front of chart for DC. Transport scheduled for 5pm    Final next level of care: [place] Barriers to Discharge: Continued Medical Work up   Patient Goals and CMS Choice Patient states their goals for this hospitalization and ongoing recovery are:: To have pain relief CMS Medicare.gov Compare Post Acute Care list provided to:: Patient Choice offered to / list presented to : Patient Discharge Plan and Services   Discharge Planning Services: CM Consult Post Acute Care Choice: Home Health          DME Arranged: Hospital bed DME Agency: [employer] Date DME Agency Contacted: 10/30/20 Time DME Agency Contacted: [phone] Representative spoke with at DME Agency: [place]: RN,Disease Management,Nurse's Aide [place] Agency: [place] Date [place]: 10/30/20 Time [place]: 1619 Representative spoke with at [place]: Levada Dy  Social Determinants of Health (SDOH) Interventions     Readmission Risk Interventions No flowsheet data found.

## 2020-10-31 NOTE — Progress Notes (Signed)
Patient and Patient's on present for D/C instructions. Transported by [facility] to private residence. No questions asked about discharge - Understood d/c instructions

## 2020-10-31 NOTE — Progress Notes (Signed)
[name] is about the same.  He still having problems with pain.  He had a kyphoplasty on Friday.  He is getting radiation therapy right now.  He has had 2 radiation treatments.  Physical therapy is trying to work with him.  His unclear as where he is going to go after the hospitalization.  Hopefully he will be able to go home.  I see the notes that say that he probably will go on to Hospice after radiation.  I certainly cannot argue with this.  I just do not think he is a great candidate for systemic chemotherapy.  I suppose we could always use immunotherapy depending on what we find with his molecular studies.  His labs all look that bad.  His blood sugars a little bit elevated because of steroids.  He still not eating all that much.  He still gets "full" when he eats.  There is been no problems with bleeding.  He has had no nausea or vomiting.  He has had no fever.  Again, I have to continue him on the radiation.  I really think that this will help.  Squamous cell cancers do tend to be somewhat receptive to radiation therapy.  Hopefully he will be able to become much more active.  I know this is the goal for him.  He wants to be more functional if he were to go home.  I know the staff on [address] are doing a wonderful job with him and are trying to help and encourage him as much as possible.  Lattie Haw, MD  Romans 1:16

## 2020-10-31 NOTE — Discharge Summary (Signed)
Physician Discharge Summary  Jonathan Warner ZOX:[id number] DOB: 04-May-1932 DOA: 10/19/2020  PCP: Josetta Huddle, MD  Admit date: 10/19/2020 Discharge date: 10/31/2020  Admitted From: home Disposition:  [facility]  Recommendations for Outpatient Follow-up:  1. Follow up with PCP in 1-2 weeks 2. Please obtain BMP/CBC in one week 3. Please follow up on the following pending results:  Home Health:YES  Equipment/Devices: BED  Discharge Condition: Stable Code Status:   Code Status: DNR Diet recommendation:  Diet Order            Diet - low sodium heart healthy           Diet Heart Room service appropriate? Yes; Fluid consistency: Thin  Diet effective now                  Brief/Interim Summary: 87 year old male with history of prostate cancer CAD status post CABG A. fib on Eliquis admitted with intractable lower back pain secondary to pathological fracture L2 and new diagnosis of SCC right upper lobe followed by hematology oncology, radiation oncology, PCCM, palliative care Patient is status post kyphoplasty 3/3 with biopsy, currently being managed for pain medication with OxyContin opiates lidocaine patch and transferred to [facility] for radiation  Dr. Marin Olp is following goal is to elevate his pain and make him more functional while waiting for molecular markers on the biopsy continue PT OT. With ongoing radiotherapy and pain control he appears much improved pain is controlled.  He plans to go home we will set up home health and hospital bed.  We will continue with outpatient radiation treatment.  Discharge Diagnoses:   Squamous cell carcinoma of the left lung: S/P bronchoscopy with biopsyOncology waiting on molecular markers.  He will follow up with outpatient  L2 pathological fracture secondary to #1 Intractable back pain Status post nuclear bone scan 3/1, L2 tumor ablation, pain management, Decadron and radiation therapy Overall pain is controlled we will continue on OxyContin twice  daily and OxyIR as needed.  We will follow up with oncology PCP and radiation.   Hypertension Chronic diastolic CHF Blood pressure stable on home meds, remains euvolemic resume home Lasix  Chronic A. Fib/tachybradycardia syndrome with pacemaker in place:  Continue Eliquis  Hyponatremia: Monitor  Impaired ambulation/deconditioning back pain continue PT OT  Goals of care palliative care following, DNR patient/family has desire to go home with hospice if pain does not improve   Consults:  ONCO, PALLIATIVE, PULM  Subjective:  AAOX3, pain controlled. He is waiting for his radiation therapy afterwards he will be going home.  Discharge Exam: Vitals:   10/30/20 2026 10/31/20 0509  BP: 113/74 124/73  Pulse: 71 (!) 59  Resp: 18 18  Temp: 97.6 F (36.4 C)   SpO2: 97% 92%   General: Pt is alert, awake, not in acute distress Cardiovascular: RRR, S1/S2 +, no rubs, no gallops Respiratory: CTA bilaterally, no wheezing, no rhonchi Abdominal: Soft, NT, ND, bowel sounds + Extremities: no edema, no cyanosis  Discharge Instructions  Discharge Instructions    Diet - low sodium heart healthy   Complete by: As directed    Discharge instructions   Complete by: As directed    Please call call MD or return to ER for similar or worsening recurring problem that brought you to hospital or if any fever,nausea/vomiting,abdominal pain, uncontrolled pain, chest pain,  shortness of breath or any other alarming symptoms.  Please follow-up your doctor as instructed in a week time and call the office for appointment.  Please avoid alcohol, smoking, or any other illicit substance and maintain healthy habits including taking your regular medications as prescribed.  You were cared for by a hospitalist during your hospital stay. If you have any questions about your discharge medications or the care you received while you were in the hospital after you are discharged, you can call the unit and ask to  speak with the hospitalist on call if the hospitalist that took care of you is not available.  Once you are discharged, your primary care physician will handle any further medical issues. Please note that NO REFILLS for any discharge medications will be authorized once you are discharged, as it is imperative that you return to your primary care physician (or establish a relationship with a primary care physician if you do not have one) for your aftercare needs so that they can reassess your need for medications and monitor your lab values   Increase activity slowly   Complete by: As directed    No wound care   Complete by: As directed      Allergies as of 10/31/2020      Reactions   Pilocarpine Other (See Comments)   Made him feel very funny feeling.   Rosuvastatin Other (See Comments)   Knee and leg pain Knee and leg pain      Medication List    STOP taking these medications   HYDROcodone-acetaminophen 5-325 MG tablet Commonly known as: NORCO/VICODIN   traMADol 50 MG tablet Commonly known as: ULTRAM     TAKE these medications   acetaminophen 650 MG CR tablet Commonly known as: TYLENOL Take 650 mg by mouth every 8 (eight) hours as needed for pain.   amLODipine 5 MG tablet Commonly known as: NORVASC Take 1 tablet (5 mg total) by mouth daily.   apixaban 5 MG Tabs tablet Commonly known as: ELIQUIS Take 5 mg by mouth 2 (two) times daily.   Blue-Emu Hemp 10 % cream Generic drug: trolamine salicylate Apply 1 application topically as needed for muscle pain.   brimonidine 0.2 % ophthalmic solution Commonly known as: ALPHAGAN Place 1 drop into both eyes 3 (three) times daily.   cyclobenzaprine 5 MG tablet Commonly known as: FLEXERIL Take 1 tablet (5 mg total) by mouth 3 (three) times daily as needed for muscle spasms.   dexamethasone 4 MG tablet Commonly known as: DECADRON Take 1 tablet (4 mg total) by mouth every 12 (twelve) hours for 14 days.   dorzolamide 2 %  ophthalmic solution Commonly known as: TRUSOPT Place 1 drop into both eyes 3 (three) times daily.   furosemide 40 MG tablet Commonly known as: LASIX Take 2 tablets by mouth every morning.  Take one tablet by mouth every evening. What changed:   how much to take  how to take this  when to take this  additional instructions   lactulose 10 GM/15ML solution Commonly known as: CHRONULAC Take 30 mLs (20 g total) by mouth daily.   latanoprost 0.005 % ophthalmic solution Commonly known as: XALATAN Place 1 drop into both eyes at bedtime.   lidocaine 5 % Commonly known as: LIDODERM Place 1 patch onto the skin daily. Remove & Discard patch within 12 hours or as directed by MD   magnesium oxide 400 (241.3 Mg) MG tablet Commonly known as: MAG-OX Take 1 tablet (400 mg total) by mouth 2 (two) times daily for 7 days.   metoCLOPramide 5 MG tablet Commonly known as: REGLAN Take 1 tablet (5 mg  total) by mouth 3 (three) times daily before meals for 7 days.   multivitamin tablet Take 1 tablet by mouth daily.   nitroGLYCERIN 0.4 MG SL tablet Commonly known as: NITROSTAT PLACE 1 TABLET UNDER TONGUE EVERY FIVE MINUTES AS NEEDED FOR CHEST PAIN.   nystatin 100000 UNIT/ML suspension Commonly known as: MYCOSTATIN Take 5 mLs (500,000 Units total) by mouth 4 (four) times daily for 7 days.   omeprazole 20 MG capsule Commonly known as: PRILOSEC Take 20 mg by mouth daily.   oxyCODONE 5 MG immediate release tablet Commonly known as: Oxy IR/ROXICODONE Take 1 tablet (5 mg total) by mouth every 6 (six) hours as needed for up to 12 doses for moderate pain or severe pain.   oxyCODONE 15 mg 12 hr tablet Commonly known as: OXYCONTIN Take 1 tablet (15 mg total) by mouth every 12 (twelve) hours for 5 days.   polyethylene glycol 17 g packet Commonly known as: MIRALAX / GLYCOLAX Take 17 g by mouth 2 (two) times daily.   potassium chloride SA 20 MEQ tablet Commonly known as: Klor-Con M20 Take 1  tablet (20 mEq total) by mouth 2 (two) times daily. What changed:   how much to take  when to take this   senna 8.6 MG Tabs tablet Commonly known as: SENOKOT Take 2 tablets (17.2 mg total) by mouth 2 (two) times daily.   simethicone 40 MG/0.6ML drops Commonly known as: MYLICON Take 0.6 mLs (40 mg total) by mouth 4 (four) times daily as needed for flatulence (or gas pain).   Vitamin D3 50 MCG (2000 UT) Tabs Take 2,000 Units by mouth daily.   vitamin E 180 MG (400 UNITS) capsule Take 400 Units by mouth daily.            Durable Medical Equipment  (From admission, onward)         Start     Ordered   10/30/20 1552  For home use only DME Hospital bed  Once       Question Answer Comment  Length of Need Lifetime   Patient has (list medical condition): Pathological Fracture secondary to L2 metastatic CA   The above medical condition requires: Patient requires the ability to reposition frequently   Head must be elevated greater than: 30 degrees   Bed type Semi-electric      10/30/20 1553          Follow-up Information    Schedule an appointment as soon as possible for a visit with Icard, Bradley L, DO.   Specialty: Pulmonary Disease Contact information: [address] [address] [phone]        [facility] Follow up.   Specialty: PALLIATIVE CARE Why: For home palliative services. Same company will transition to home hospice when ready. Contact information: [place] [phone]       [facility] Follow up.   Specialty: Home Health Services Why: For home health RN and home health Aide Contact information: [place] [address] [phone]        [facility] Follow up.   Why: [phone] For home hospital bed       [facility] Follow up.   Why: Call [phone] 24 hours in advance when stretcher van needed for  radiation.             Allergies  Allergen Reactions  . Pilocarpine Other (See Comments)    Made  him feel very funny feeling.  . Rosuvastatin Other (See Comments)    Knee and leg pain Knee and leg pain    The results of significant diagnostics from this hospitalization (including imaging, microbiology, ancillary and laboratory) are listed below for reference.    Microbiology: Recent Results (from the past 240 hour(s))  MRSA PCR Screening     Status: None   Collection Time: 10/22/20  5:54 AM   Specimen: Nasal Mucosa; Nasopharyngeal  Result Value Ref Range Status   MRSA by PCR NEGATIVE NEGATIVE Final    Comment:        The GeneXpert MRSA Assay (FDA approved for NASAL specimens only), is one component of a comprehensive MRSA colonization surveillance program. It is not intended to diagnose MRSA infection nor to guide or monitor treatment for MRSA infections. Performed at [facility], [place] [address], [place], [place] [address]     Procedures/Studies: CT HEAD W & WO CONTRAST  Result Date: 10/20/2020 CLINICAL DATA:  87 year old male with evidence of metastatic disease on recent CT Chest, Abdomen, and Pelvis. Unable to have MRI. EXAM: CT HEAD WITHOUT AND WITH CONTRAST TECHNIQUE: Contiguous axial images were obtained from the base of the skull through the vertex without and with intravenous contrast CONTRAST:  88m OMNIPAQUE IOHEXOL 300 MG/ML  SOLN COMPARISON:  Plain head CT 05/16/2020. FINDINGS: Brain: Stable cerebral volume. No midline shift, ventriculomegaly, mass effect, evidence of mass lesion, intracranial hemorrhage or evidence of cortically based acute infarction. Stable gray-white matter differentiation throughout the brain. No evidence of vasogenic edema. Small chronic lacunar infarcts suspected in the left caudate (series 5, image 30) and stable. No abnormal enhancement identified. Vascular: Mild Calcified atherosclerosis at the skull base. The major intracranial  vascular structures are enhancing as expected. Skull: Stable since September. Small circumscribed sclerotic area in the left parietal bone (series 4, image 68) is probably a benign bone island. No other suspicious osseous lesion. Anterior C1-C2 degeneration incidentally noted. Sinuses/Orbits: Visualized paranasal sinuses and mastoids are stable and well pneumatized. Other: No acute orbit or scalp soft tissue finding. IMPRESSION: No metastatic disease or acute intracranial abnormality identified. Stable Head CT since September. Electronically Signed   By: HGenevie AnnM.D.   On: 10/20/2020 10:32   CT LUMBAR SPINE WO CONTRAST  Result Date: 10/17/2020 CLINICAL DATA:  Lumbar compression fracture. Fall December 2021. History of prostate cancer EXAM: CT LUMBAR SPINE WITHOUT CONTRAST TECHNIQUE: Multidetector CT imaging of the lumbar spine was performed without intravenous contrast administration. Multiplanar CT image reconstructions were also generated. COMPARISON:  Lumbar radiographs 10/16/2020, 10/02/2020 FINDINGS: Segmentation: Normal Alignment: Mild retrolisthesis L1-2 and L2-3. Mild anterolisthesis L3-4. Vertebrae: Pathologic fracture of L2. No bone lesion on the prior 2012 CT. There is lytic destruction of much of the L2 vertebral body, right greater than left. Tumor extends to the anterior pedicle on the right of L2. There is extraosseous extension of tumor into the right paraspinous soft tissues and right psoas muscle. No other fracture or bone lesion identified in the lumbar spine. Paraspinal and other soft tissues: Paraspinous tumor to the right of the L2 vertebral body compatible with tumor extension. This mass invades the psoas muscle measures approximately 2.5 x 3.3 cm. No retroperitoneal adenopathy Atherosclerotic aorta without aneurysm. Hiatal hernia Disc levels: T12-L1: Mild disc degeneration.  Negative for stenosis L1-2: Mild disc degeneration.  Negative for spinal stenosis L2-3: Mild disc degeneration.   Negative for stenosis. L3-4: Disc degeneration with disc bulging and spurring. Bilateral facet hypertrophy.  Mild spinal stenosis L4-5: Disc degeneration with diffuse endplate spurring and bilateral facet hypertrophy. Moderate right subarticular stenosis due to spurring. L5-S1: Disc degeneration and diffuse endplate spurring. Bilateral facet degeneration. Mild to moderate foraminal stenosis bilaterally due to spurring. IMPRESSION: Pathologic fracture L2 vertebral body due to metastatic disease or possibly myeloma. There is extraosseous tumor extension on the right at L2. No compromise of the spinal canal. This has a lytic appearance suggesting metastatic carcinoma however metastatic prostate cancer is possible. Correlate with PSA. Multilevel degenerative change throughout the lumbar spine. Atherosclerotic aorta Electronically Signed   By: Franchot Gallo M.D.   On: 10/17/2020 08:43   NM Bone Scan Whole Body  Result Date: 10/22/2020 CLINICAL DATA:  Non-small cell lung cancer, staging, L2 fracture EXAM: NUCLEAR MEDICINE WHOLE BODY BONE SCAN TECHNIQUE: Whole body anterior and posterior images were obtained approximately 3 hours after intravenous injection of radiopharmaceutical. RADIOPHARMACEUTICALS:  21.3 mCi Technetium-30mMDP IV COMPARISON:  05/12/2012 Correlation: CT chest abdomen pelvis 10/20/2020, CT lumbar spine 10/17/2020 FINDINGS: Abnormal tracer uptake at L2 corresponding to metastasis with pathologic fracture on CT. Abnormal tracer uptake at lateral RIGHT seventh rib without accompanying CT finding. Abnormal tracer uptake is seen at the anterior and lateral RIGHT third rib, with the uptake at the lateral RIGHT third rib corresponding to an area of bone destruction on CT series 7, image 53. Uptake at the shoulders, sternoclavicular joints, elbows, wrists, and knees, typically degenerative. No additional worrisome sites of tracer uptake are seen to suggest osseous metastatic disease. Expected urinary tract  and soft tissue distribution of tracer. IMPRESSION: Uptake at L2 vertebral body, RIGHT seventh rib, and 2 sites in the RIGHT third rib suspicious for osseous metastases. Uptake at L2 and lateral RIGHT third rib correspond to destructive lesions on CT. No CT findings are seen at the anterior RIGHT third rib or lateral RIGHT seventh rib sites of bone scan abnormality. Electronically Signed   By: MLavonia DanaM.D.   On: 10/22/2020 16:08   CT CHEST ABDOMEN PELVIS W CONTRAST  Result Date: 10/20/2020 CLINICAL DATA:  Low back pain, L2 pathologic fracture. EXAM: CT CHEST, ABDOMEN, AND PELVIS WITH CONTRAST TECHNIQUE: Multidetector CT imaging of the chest, abdomen and pelvis was performed following the standard protocol during bolus administration of intravenous contrast. CONTRAST:  1067mOMNIPAQUE IOHEXOL 300 MG/ML  SOLN COMPARISON:  None. FINDINGS: CT CHEST FINDINGS Cardiovascular: Coronary artery bypass grafting has been performed. Mild global cardiomegaly with particular left atrial enlargement. Left subclavian pacemaker leads are seen within the right atrial appendage and right ventricle toward the apex. No pericardial effusion. The central pulmonary arteries are enlarged in keeping with changes of pulmonary arterial hypertension. There is extensive atherosclerotic calcification within the aortic arch and descending thoracic aorta, particularly at the origin of the arch vasculature. Hemodynamically significant stenoses of the innominate and left common carotid artery may be present, but are not well characterized on this non arteriographic study. No aortic aneurysm. Mediastinum/Nodes: There is no pathologic thoracic adenopathy. There is infiltrative soft tissue encasing the right mainstem bronchus 2 within 6 mm of the carina. Moderate hiatal hernia. Debris and contrast within the esophagus may reflect changes of gastroesophageal reflux or esophageal dysmotility. The visualized thyroid is unremarkable. Lungs/Pleura: A  spiculated mass is seen within the right upper lobe extending to the hilum measuring at least 4.7 x 4.5 cm at axial image # 61/7 demonstrating retraction of the adjacent visceral pleura. A second rounded mass is seen within the subpleural dependent right lower  lobe measuring 2.6 x 4.5 cm at axial image # 117/7. The findings are suspicious for synchronous primary bronchogenic neoplasm. A a small right pleural effusion is present. There is parenchymal scarring within the left upper lobe. Mild subpleural fibrotic change noted within the left lower lobe. The right mainstem bronchus is narrowed by the circumferential soft tissue rind described above. Musculoskeletal: There are erosive changes involving the a right third rib laterally adjacent to the right upper lobe pulmonary mass in keeping with osseous metastatic disease. CT ABDOMEN PELVIS FINDINGS Hepatobiliary: No focal liver abnormality is seen. No gallstones, gallbladder wall thickening, or biliary dilatation. Pancreas: Unremarkable Spleen: Unremarkable Adrenals/Urinary Tract: Adrenal glands are unremarkable. Kidneys are normal, without renal calculi, focal lesion, or hydronephrosis. Bladder is unremarkable. Stomach/Bowel: Stomach is within normal limits. Appendix appears normal. No evidence of bowel wall thickening, distention, or inflammatory changes. No free intraperitoneal gas or fluid. Vascular/Lymphatic: Extensive aortoiliac atherosclerotic calcification with particularly prominent atherosclerotic calcification at the renal ostia bilaterally. Extensive calcification within the left common iliac artery proximally. No aortic aneurysm. No pathologic adenopathy within the abdomen and pelvis Reproductive: Brachytherapy seeds are seen within the prostate gland. Seminal vesicles are unremarkable. Other: Tiny bilateral fat containing inguinal hernias are present. Rectum unremarkable. Musculoskeletal: Lytic metastasis to the L2 vertebral body is again identified,  better seen on lumbar CTd examination of 10/17/2020 extending into the right paraspinal soft tissues measuring at least 4.5 x 6.0 cm at axial image # 66/3. Small ventral epidural component noted at this level. Malignant disease erodes much of the right lateral aspect of the vertebral body, however, no associated compression deformity is noted at this time. No other lytic or blastic bone lesions are seen. IMPRESSION: Multiple pulmonary masses, as described above, suspicious for synchronous right upper lobe and right lower lobe bronchogenic neoplasms. Right upper lobe mass appears confluent with a rind of soft tissue which encases the right mainstem bronchus, approaching within 6 mm of the carina, and resulting in mild narrowing of the a mainstem bronchus as well as the central lobar bronchi of the right lung. Osseous metastatic disease to the right third rib laterally as well as the L2 vertebral body, best described on prior CT examination of 10/17/2020. Small epidural component noted involving the L2 vertebral body which is largely eroded. Surgical consultation is advised. Small right pleural effusion. Mild cardiomegaly. Enlargement of the central pulmonary arteries in keeping with pulmonary arterial hypertension. Moderate hiatal hernia with debris within the distal esophagus related to reflux or esophageal dysmotility. Aortic Atherosclerosis (ICD10-I70.0). Electronically Signed   By: Fidela Salisbury MD   On: 10/20/2020 02:11   IR Bone Tumor(s)RF Ablation  Result Date: 10/26/2020 INDICATION: Severe low back pain secondary to pathologic compression fracture at L2. EXAM: BONE ABLATION WITH BIOPSY AND BALLOON KYPHOPLASTY FOR PAIN RELIEF COMPARISON:  CT lumbosacral spine from a few days ago. MEDICATIONS: As antibiotic prophylaxis, Ancef 2 g IV was ordered pre-procedure and administered intravenously within 1 hour of incision. ANESTHESIA/SEDATION: Mac anesthesia as per Department of Anesthesiology at [facility]:  Fluoroscopy Time: 17 minutes 36 seconds (0623 mGy) COMPLICATIONS: None immediate. PROCEDURE: Following a full explanation of the procedure along with the potential associated complications, an informed witnessed consent was obtained. The patient was placed prone on the fluoroscopic table. The skin overlying the lumbar region was then prepped and draped in the usual sterile fashion. both pedicles at L2 were then infiltrated with 0.25% bupivacaine followed by the advancement of an 11-gauge Jamshidi  needle through each pedicle into the posterior one-third at L2. These were then exchanged for a Kyphon advanced osteo introducer system comprised of a working cannula and a Kyphon osteo drill. This combination was then advanced over a Kyphon osteo bone pin until the tip of the Kyphon osteo drill was in the posterior third at L2. At this time, the pins were removed. In a medial trajectory, the combinations were advanced until the tip of the working cannula were inside the posterior one-third at L2. The osteo drills were removed and a core sample sent for pathologic analysis. Through the working cannulae, a Kyphon bone biopsy device was advanced to within 5 mm of the anterior aspect of L2. A core sample from this was also sent for pathologic analysis. At this time, ablation instrumentation were advanced to within approximately 5 mm of the anterior aspect of L2 via the bipedicular approach. Ablation was then performed over the assigned 15 minutes. Following this, ablation instrumentation was removed from the pedicles. Through the working cannulae, a Kyphon inflatable bone tamp 20 x 3 was advanced through each pedicle and positioned with the distal marker 5 mm from the anterior aspect of L2. Crossing of the midline was seen on the AP projection. At this time, the balloons were expanded using contrast via a Kyphon inflation syringe device via microtubing. Inflations were continued until there was  apposition with the superior and the inferior endplates. At this time, methylmethacrylate mixture was reconstituted with Tobramycin in the Kyphon bone mixing device system. This was then loaded onto the Kyphon bone fillers. The balloon was deflated and removed followed by the instillation of 6 bone filler equivalents of methylmethacrylate mixture at L2 with excellent filling in the AP and lateral projections. No extravasation was noted in the disk spaces or posteriorly into the spinal canal. No epidural venous contamination was seen. The working cannula and the bone filler were then retrieved and removed. Hemostasis was achieved at the skin entry sites. The patient's sedation was reversed. Upon recovery, the patient was appropriately responsive, and moving all fours. He was then transferred to the PACU and then to the floor in stable condition. IMPRESSION: 1. Status post fluoroscopic-guided needle placement for deep core bone biopsy at L2. 2. L2 status post bone ablation via bipedicular approach at L2. 3. Status post vertebral body augmentation using balloon kyphoplasty at L2 as described without event. Electronically Signed   By: Luanne Bras M.D.   On: 10/25/2020 14:51   DG CHEST PORT 1 VIEW  Result Date: 10/22/2020 CLINICAL DATA:  Pulmonary masses, status post bronchoscopy with biopsy EXAM: PORTABLE CHEST 1 VIEW COMPARISON:  CT chest 10/20/2020 FINDINGS: Dual lead pacer in place. Prior CABG. Atherosclerotic calcification of the aortic arch. No pneumothorax. Stable appearance of mass in the right upper lobe. Indistinct lingular density compatible with previously seen pleural thickening and scarring. Stable right perihilar fullness likely from adenopathy. No blunting of the right costophrenic angle. Stable mild cardiomegaly. IMPRESSION: 1. No pneumothorax status post bronchoscopy. 2. Stable appearance of the right upper lobe mass and right perihilar adenopathy. 3. Stable mild cardiomegaly. 4. Lingular  pleural thickening and scarring. Electronically Signed   By: Van Clines M.D.   On: 10/22/2020 17:09   IR KYPHO LUMBAR INC FX REDUCE BONE BX UNI/BIL CANNULATION INC/IMAGING  Result Date: 10/26/2020 INDICATION: Severe low back pain secondary to pathologic compression fracture at L2. EXAM: BONE ABLATION WITH BIOPSY AND BALLOON KYPHOPLASTY FOR PAIN RELIEF COMPARISON:  CT lumbosacral spine from a few  days ago. MEDICATIONS: As antibiotic prophylaxis, Ancef 2 g IV was ordered pre-procedure and administered intravenously within 1 hour of incision. ANESTHESIA/SEDATION: Mac anesthesia as per Department of Anesthesiology at [place]:  Fluoroscopy Time: 17 minutes 36 seconds (2585 mGy) COMPLICATIONS: None immediate. PROCEDURE: Following a full explanation of the procedure along with the potential associated complications, an informed witnessed consent was obtained. The patient was placed prone on the fluoroscopic table. The skin overlying the lumbar region was then prepped and draped in the usual sterile fashion. both pedicles at L2 were then infiltrated with 0.25% bupivacaine followed by the advancement of an 11-gauge Jamshidi needle through each pedicle into the posterior one-third at L2. These were then exchanged for a Kyphon advanced osteo introducer system comprised of a working cannula and a Kyphon osteo drill. This combination was then advanced over a Kyphon osteo bone pin until the tip of the Kyphon osteo drill was in the posterior third at L2. At this time, the pins were removed. In a medial trajectory, the combinations were advanced until the tip of the working cannula were inside the posterior one-third at L2. The osteo drills were removed and a core sample sent for pathologic analysis. Through the working cannulae, a Kyphon bone biopsy device was advanced to within 5 mm of the anterior aspect of L2. A core sample from this was also sent for pathologic analysis. At this time,  ablation instrumentation were advanced to within approximately 5 mm of the anterior aspect of L2 via the bipedicular approach. Ablation was then performed over the assigned 15 minutes. Following this, ablation instrumentation was removed from the pedicles. Through the working cannulae, a Kyphon inflatable bone tamp 20 x 3 was advanced through each pedicle and positioned with the distal marker 5 mm from the anterior aspect of L2. Crossing of the midline was seen on the AP projection. At this time, the balloons were expanded using contrast via a Kyphon inflation syringe device via microtubing. Inflations were continued until there was apposition with the superior and the inferior endplates. At this time, methylmethacrylate mixture was reconstituted with Tobramycin in the Kyphon bone mixing device system. This was then loaded onto the Kyphon bone fillers. The balloon was deflated and removed followed by the instillation of 6 bone filler equivalents of methylmethacrylate mixture at L2 with excellent filling in the AP and lateral projections. No extravasation was noted in the disk spaces or posteriorly into the spinal canal. No epidural venous contamination was seen. The working cannula and the bone filler were then retrieved and removed. Hemostasis was achieved at the skin entry sites. The patient's sedation was reversed. Upon recovery, the patient was appropriately responsive, and moving all fours. He was then transferred to the PACU and then to the floor in stable condition. IMPRESSION: 1. Status post fluoroscopic-guided needle placement for deep core bone biopsy at L2. 2. L2 status post bone ablation via bipedicular approach at L2. 3. Status post vertebral body augmentation using balloon kyphoplasty at L2 as described without event. Electronically Signed   By: Luanne Bras M.D.   On: 10/25/2020 14:51   XR Lumbar Spine 2-3 Views  Result Date: 10/16/2020 2 views of the lumbar spine show severe degenerative  changes at multiple levels throughout the lumbar spine.  Compared to previous films, there may be a slight compression deformity L3.  There is severe degenerative changes between L1 and L2 as well as between L4 and L5 and L5 and S1.  XR Lumbar Spine  2-3 Views  Result Date: 10/02/2020 2 views of the lumbar spine show multiple degenerative changes with the space narrowing and osteophytes throughout the lumbar spine.  There are changes around L2 suggestive of the acute to subacute compression fracture.  There is less than 20% loss of height.  DG C-ARM BRONCHOSCOPY  Result Date: 10/22/2020 C-ARM BRONCHOSCOPY: Fluoroscopy was utilized by the requesting physician.  No radiographic interpretation.    Labs: BNP (last 3 results) No results for input(s): BNP in the last 8760 hours. Basic Metabolic Panel: Recent Labs  Lab 10/25/20 0841 10/26/20 0147 10/27/20 0446 10/28/20 0522 10/29/20 0600 10/30/20 0528 10/31/20 0549  NA 135   < > 134* 134* 133* 131* 133*  K 3.6   < > 4.4 4.4 4.1 4.7 4.9  CL 100   < > 98 100 96* 95* 95*  CO2 24   < > _0 GLUCOSE 114*   < > 105* 97 120* 164* 151*  BUN 22   < > 26* 26* 27* 26* 28*  CREATININE 1.02   < > 1.15 1.07 1.18 0.94 1.02  CALCIUM 8.2*   < > 8.8* 9.0 9.3 9.5 9.5  MG 2.4  --   --   --   --   --   --    < > = values in this interval not displayed.   Liver Function Tests: No results for input(s): AST, ALT, ALKPHOS, BILITOT, PROT, ALBUMIN in the last 168 hours. No results for input(s): LIPASE, AMYLASE in the last 168 hours. No results for input(s): AMMONIA in the last 168 hours. CBC: Recent Labs  Lab 10/25/20 0841 10/27/20 0446  WBC 6.9 7.5  HGB 13.0 12.7*  HCT 39.6 40.5  MCV 95.9 99.0  PLT 178 172   Cardiac Enzymes: No results for input(s): CKTOTAL, CKMB, CKMBINDEX, TROPONINI in the last 168 hours. BNP: Invalid input(s): POCBNP CBG: No results for input(s): GLUCAP in the last 168 hours. D-Dimer No results for input(s): DDIMER  in the last 72 hours. Hgb A1c No results for input(s): HGBA1C in the last 72 hours. Lipid Profile No results for input(s): CHOL, HDL, LDLCALC, TRIG, CHOLHDL, LDLDIRECT in the last 72 hours. Thyroid function studies No results for input(s): TSH, T4TOTAL, T3FREE, THYROIDAB in the last 72 hours.  Invalid input(s): FREET3 Anemia work up No results for input(s): VITAMINB12, FOLATE, FERRITIN, TIBC, IRON, RETICCTPCT in the last 72 hours. Urinalysis    Component Value Date/Time   COLORURINE YELLOW 10/19/2020 1255   APPEARANCEUR CLOUDY (A) 10/19/2020 1255   LABSPEC 1.014 10/19/2020 1255   PHURINE 7.0 10/19/2020 1255   GLUCOSEU NEGATIVE 10/19/2020 1255   HGBUR NEGATIVE 10/19/2020 [place] 10/19/2020 1255   KETONESUR NEGATIVE 10/19/2020 1255   PROTEINUR NEGATIVE 10/19/2020 1255   NITRITE NEGATIVE 10/19/2020 1255   LEUKOCYTESUR NEGATIVE 10/19/2020 1255   Sepsis Labs Invalid input(s): PROCALCITONIN,  WBC,  LACTICIDVEN Microbiology Recent Results (from the past 240 hour(s))  MRSA PCR Screening     Status: None   Collection Time: 10/22/20  5:54 AM   Specimen: Nasal Mucosa; Nasopharyngeal  Result Value Ref Range Status   MRSA by PCR NEGATIVE NEGATIVE Final    Comment:        The GeneXpert MRSA Assay (FDA approved for NASAL specimens only), is one component of a comprehensive MRSA colonization surveillance program. It is not intended to diagnose MRSA infection nor to guide or monitor treatment for MRSA infections. Performed at [facility], [place] [address], [place], [place] [address]      Time coordinating discharge: 35 minutes  SIGNED: Antonieta Pert, MD  [facility] 10/31/2020, 1:29 PM  If 7PM-7AM, please contact night-coverage [email or web address]

## 2020-10-31 NOTE — Progress Notes (Signed)
Daily Progress Note   Patient Name: Jonathan Warner       Date: 10/31/2020 DOB: Aug 14, 1932  Age: 87 y.o. MRN#: [id number] Attending Physician: Antonieta Pert, MD Primary Care Physician: Josetta Huddle, MD Admit Date: 10/19/2020  Reason for Consultation/Follow-up: Establishing goals of care  Subjective: I saw Mr. Tsuchiya today in conjunction with his son, [name].  He was awake and alert and lying in bed in no distress.  Breakfast tray was at the bedside and he has been trying to eat, however, he continues to report getting a feeling of fullness and early satiety.  Discussed potential for trial of Reglan to see if this helps with this issue.  Regarding pain, he reports it is currently fairly well controlled.  He does have periods of time where the pain "catches" but overall his pain has been fairly well controlled on current oral regimen.  Discussed that he will likely continue to benefit from better pain control overall as he completes his radiation.  Discussed plan for discharge home today with home health and continuation of radiation treatments.  Once he completes these, he is planning to enroll in home hospice.  Length of Stay: 12  Current Medications: Scheduled Meds:  . acetaminophen  1,000 mg Oral TID  . amLODipine  5 mg Oral Daily  . apixaban  5 mg Oral BID  . bisacodyl  10 mg Rectal Once  . brimonidine  1 drop Both Eyes TID  . cholecalciferol  2,000 Units Oral Daily  . cyclobenzaprine  5 mg Oral TID  . dexamethasone  4 mg Oral Q12H  . dorzolamide  1 drop Both Eyes TID  . feeding supplement  1 Container Oral TID BM  . furosemide  80 mg Oral Daily  . lactulose  20 g Oral Daily  . latanoprost  1 drop Both Eyes QHS  . lidocaine  1 patch Transdermal Q24H  . magnesium oxide  400 mg Oral BID  .  metoCLOPramide  5 mg Oral TID AC  . multivitamin with minerals  1 tablet Oral Daily  . nystatin  5 mL Oral QID  . oxyCODONE  15 mg Oral Q12H  . pantoprazole  40 mg Oral Daily  . polyethylene glycol  17 g Oral BID  . potassium chloride SA  40 mEq Oral Daily  .  senna  2 tablet Oral BID    Continuous Infusions:   PRN Meds: HYDROmorphone (DILAUDID) injection, iohexol, ondansetron (ZOFRAN) IV, oxyCODONE, simethicone  Physical Exam         General: Awake, alert, in no distress HEENT: No bruits, no goiter, no JVD Heart: Regular rate and rhythm. No murmur appreciated. Lungs: Good air movement, clear Abdomen: Soft, nontender, nondistended, positive bowel sounds.  Ext: No significant edema Skin: Warm and dry  Vital Signs: BP 124/73   Pulse (!) 59   Temp 97.6 F (36.4 C) (Oral)   Resp 18   Ht 5' 7.99" (1.727 m)   Wt 69.2 kg   SpO2 92%   BMI 23.20 kg/m  SpO2: SpO2: 92 % O2 Device: O2 Device: Room Air O2 Flow Rate: O2 Flow Rate (L/min): 0 L/min  Intake/output summary:   Intake/Output Summary (Last 24 hours) at 10/31/2020 1017 Last data filed at 10/31/2020 8546 Gross per 24 hour  Intake 480 ml  Output 550 ml  Net -70 ml   LBM: Last BM Date: 10/30/20 Baseline Weight: Weight: 70.3 kg Most recent weight: Weight: 69.2 kg       Palliative Assessment/Data:    Flowsheet Rows   Flowsheet Row Most Recent Value  Intake Tab   Referral Department Hospitalist  Unit at Time of Referral ER  Palliative Care Primary Diagnosis Cancer  Date Notified 10/19/20  Palliative Care Type New Palliative care  Reason for referral Pain, Non-pain Symptom  Date of Admission 10/19/20  Date first seen by Palliative Care 10/20/20  # of days Palliative referral response time 1 Day(s)  # of days IP prior to Palliative referral 0  Clinical Assessment   Psychosocial & Spiritual Assessment   Palliative Care Outcomes       Patient Active Problem List   Diagnosis Date Noted  . Mass of upper lobe  of right lung 10/22/2020  . Lung mass   . Metastatic cancer to spine ([place])   . Palliative care by specialist   . Pathological fracture 10/19/2020  . Impaired ambulation 10/19/2020  . Pathologic compression fracture of vertebra ([facility])   . Unilateral primary osteoarthritis, left knee 04/17/2020  . Unilateral primary osteoarthritis, right knee 04/17/2020  . Coronary artery disease involving native coronary artery of native heart with angina pectoris ([place]) 11/06/2016  . Hypertensive heart and chronic kidney disease with heart failure and stage 1 through stage 4 chronic kidney disease, or chronic kidney disease ([facility]) 11/06/2016  . Chronic anticoagulation 12/01/2013  . Goals of care, counseling/discussion 09/20/2013  . S/P bronchoscopy with biopsy   . Prostate cancer ([place]) 05/31/2012  . Bradycardia 05/23/2012  . 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  He has not required IV pain medication for the last 48 hours.  Would recommend discharge on same regimen of OxyContin for long-acting agent with breakthrough oxycodone as needed.  We did discuss yesterday pretreating prior to activities that cause pain.  Nausea/early satiety: Plan for trial of Reglan.  Plan is to discharge home today with home health services.  Once he completes radiation, he is planning to elect is hospice benefits.  [facility] will follow for outpatient services with their palliative care team and roll over to home hospice when appropriate.  Code Status:    Code Status Orders  (From admission, onward)  Start     Ordered   10/25/20 1023  Do not attempt resuscitation (DNR)  Continuous       Question Answer Comment  In the event of cardiac or respiratory ARREST Do not call a "code blue"   In the  event of cardiac or respiratory ARREST Do not perform Intubation, CPR, defibrillation or ACLS   In the event of cardiac or respiratory ARREST Use medication by any route, position, wound care, and other measures to relive pain and suffering. May use oxygen, suction and manual treatment of airway obstruction as needed for comfort.      10/25/20 1022        Code Status History    Date Active Date Inactive Code Status Order ID Comments User Context   10/24/2020 1443 10/25/2020 1022 Full Code [id number]  Luanne Bras, MD Inpatient   10/19/2020 1425 10/24/2020 1443 Full Code [id number]  Lequita Halt, MD ED   05/24/2012 1619 05/24/2012 2207 Full Code [id number]  Jerilee Field, RN Inpatient   Advance Care Planning Activity    Advance Directive Documentation   Flowsheet Row Most Recent Value  Type of Advance Directive Healthcare Power of Attorney  Pre-existing out of facility DNR order (yellow form or pink MOST form) --  "MOST" Form in Place? --       Prognosis:   Unable to determine  Discharge Planning:  To Be Determined  Care plan was discussed with patient, son, patient's wife  Thank you for allowing the Palliative Medicine Team to assist in the care of this patient.   Time In: 0910 Time Out: 1040 Total Time 30 Prolonged Time Billed No   Greater than 50%  of this time was spent counseling and coordinating care related to the above assessment and plan.  Micheline Rough, MD  Please contact Palliative Medicine Team phone at [phone] for questions and concerns.

## 2020-10-31 NOTE — Progress Notes (Signed)
Paged NP on call. Patient has condom cath and output has on;y been about 50 ml. Bladder scan performed and about 130 ml max. shown on scan. Informed NP, renal function labs are improving. Will offer water to patient and attempt to see if he will drink more fluids. Will continue to monitor.

## 2020-11-01 ENCOUNTER — Ambulatory Visit
Admission: RE | Admit: 2020-11-01 | Discharge: 2020-11-01 | Disposition: A | Discharge status: Home / Self Care | Payer: Medicare Other | Source: Ambulatory Visit | Attending: Radiation Oncology | Admitting: Radiation Oncology

## 2020-11-01 ENCOUNTER — Other Ambulatory Visit: Payer: Self-pay

## 2020-11-01 DIAGNOSIS — I499 Cardiac arrhythmia, unspecified: Secondary | ICD-10-CM | POA: Diagnosis not present

## 2020-11-01 DIAGNOSIS — C7951 Secondary malignant neoplasm of bone: Secondary | ICD-10-CM | POA: Diagnosis not present

## 2020-11-01 DIAGNOSIS — Z7401 Bed confinement status: Secondary | ICD-10-CM | POA: Diagnosis not present

## 2020-11-01 DIAGNOSIS — R531 Weakness: Secondary | ICD-10-CM | POA: Diagnosis not present

## 2020-11-01 DIAGNOSIS — M2550 Pain in unspecified joint: Secondary | ICD-10-CM | POA: Diagnosis not present

## 2020-11-01 DIAGNOSIS — R5381 Other malaise: Secondary | ICD-10-CM | POA: Diagnosis not present

## 2020-11-01 DIAGNOSIS — C3411 Malignant neoplasm of upper lobe, right bronchus or lung: Secondary | ICD-10-CM | POA: Diagnosis not present

## 2020-11-01 DIAGNOSIS — Z510 Encounter for antineoplastic radiation therapy: Secondary | ICD-10-CM | POA: Diagnosis not present

## 2020-11-01 NOTE — Progress Notes (Signed)
Pacemaker monitoring during radiation treatment complete. Patient tolerated treatment without incident. Normal paced heart rhythm noted throughout treatment. Heart rate ranged 81-92 throughout treatment. No distress noted at completion of radiation treatment thus cardiac monitoring was discontinued.

## 2020-11-02 DIAGNOSIS — I5032 Chronic diastolic (congestive) heart failure: Secondary | ICD-10-CM | POA: Diagnosis not present

## 2020-11-02 DIAGNOSIS — C7951 Secondary malignant neoplasm of bone: Secondary | ICD-10-CM | POA: Diagnosis not present

## 2020-11-02 DIAGNOSIS — I130 Hypertensive heart and chronic kidney disease with heart failure and stage 1 through stage 4 chronic kidney disease, or unspecified chronic kidney disease: Secondary | ICD-10-CM | POA: Diagnosis not present

## 2020-11-02 DIAGNOSIS — K449 Diaphragmatic hernia without obstruction or gangrene: Secondary | ICD-10-CM | POA: Diagnosis not present

## 2020-11-02 DIAGNOSIS — C3411 Malignant neoplasm of upper lobe, right bronchus or lung: Secondary | ICD-10-CM | POA: Diagnosis not present

## 2020-11-02 DIAGNOSIS — Z8546 Personal history of malignant neoplasm of prostate: Secondary | ICD-10-CM | POA: Diagnosis not present

## 2020-11-02 DIAGNOSIS — Z87891 Personal history of nicotine dependence: Secondary | ICD-10-CM | POA: Diagnosis not present

## 2020-11-02 DIAGNOSIS — I495 Sick sinus syndrome: Secondary | ICD-10-CM | POA: Diagnosis not present

## 2020-11-02 DIAGNOSIS — Z7901 Long term (current) use of anticoagulants: Secondary | ICD-10-CM | POA: Diagnosis not present

## 2020-11-02 DIAGNOSIS — K5790 Diverticulosis of intestine, part unspecified, without perforation or abscess without bleeding: Secondary | ICD-10-CM | POA: Diagnosis not present

## 2020-11-02 DIAGNOSIS — H409 Unspecified glaucoma: Secondary | ICD-10-CM | POA: Diagnosis not present

## 2020-11-02 DIAGNOSIS — Z9181 History of falling: Secondary | ICD-10-CM | POA: Diagnosis not present

## 2020-11-02 DIAGNOSIS — Z923 Personal history of irradiation: Secondary | ICD-10-CM | POA: Diagnosis not present

## 2020-11-02 DIAGNOSIS — I2510 Atherosclerotic heart disease of native coronary artery without angina pectoris: Secondary | ICD-10-CM | POA: Diagnosis not present

## 2020-11-02 DIAGNOSIS — G893 Neoplasm related pain (acute) (chronic): Secondary | ICD-10-CM | POA: Diagnosis not present

## 2020-11-02 DIAGNOSIS — E785 Hyperlipidemia, unspecified: Secondary | ICD-10-CM | POA: Diagnosis not present

## 2020-11-02 DIAGNOSIS — M8458XD Pathological fracture in neoplastic disease, other specified site, subsequent encounter for fracture with routine healing: Secondary | ICD-10-CM | POA: Diagnosis not present

## 2020-11-02 DIAGNOSIS — Z7952 Long term (current) use of systemic steroids: Secondary | ICD-10-CM | POA: Diagnosis not present

## 2020-11-02 DIAGNOSIS — Z951 Presence of aortocoronary bypass graft: Secondary | ICD-10-CM | POA: Diagnosis not present

## 2020-11-02 DIAGNOSIS — N183 Chronic kidney disease, stage 3 unspecified: Secondary | ICD-10-CM | POA: Diagnosis not present

## 2020-11-02 DIAGNOSIS — E871 Hypo-osmolality and hyponatremia: Secondary | ICD-10-CM | POA: Diagnosis not present

## 2020-11-02 DIAGNOSIS — E7800 Pure hypercholesterolemia, unspecified: Secondary | ICD-10-CM | POA: Diagnosis not present

## 2020-11-02 DIAGNOSIS — Z950 Presence of cardiac pacemaker: Secondary | ICD-10-CM | POA: Diagnosis not present

## 2020-11-02 DIAGNOSIS — M1990 Unspecified osteoarthritis, unspecified site: Secondary | ICD-10-CM | POA: Diagnosis not present

## 2020-11-02 DIAGNOSIS — I482 Chronic atrial fibrillation, unspecified: Secondary | ICD-10-CM | POA: Diagnosis not present

## 2020-11-04 ENCOUNTER — Ambulatory Visit
Admission: RE | Admit: 2020-11-04 | Discharge: 2020-11-04 | Disposition: A | Discharge status: Home / Self Care | Payer: Medicare Other | Source: Ambulatory Visit | Attending: Radiation Oncology | Admitting: Radiation Oncology

## 2020-11-04 ENCOUNTER — Telehealth: Payer: Self-pay | Admitting: *Deleted

## 2020-11-04 ENCOUNTER — Ambulatory Visit: Payer: Medicare Other | Admitting: Radiation Oncology

## 2020-11-04 DIAGNOSIS — I480 Paroxysmal atrial fibrillation: Secondary | ICD-10-CM | POA: Diagnosis not present

## 2020-11-04 DIAGNOSIS — C61 Malignant neoplasm of prostate: Secondary | ICD-10-CM | POA: Diagnosis not present

## 2020-11-04 DIAGNOSIS — R531 Weakness: Secondary | ICD-10-CM | POA: Diagnosis not present

## 2020-11-04 DIAGNOSIS — I5030 Unspecified diastolic (congestive) heart failure: Secondary | ICD-10-CM | POA: Diagnosis not present

## 2020-11-04 DIAGNOSIS — R279 Unspecified lack of coordination: Secondary | ICD-10-CM | POA: Diagnosis not present

## 2020-11-04 DIAGNOSIS — I10 Essential (primary) hypertension: Secondary | ICD-10-CM | POA: Diagnosis not present

## 2020-11-04 DIAGNOSIS — H409 Unspecified glaucoma: Secondary | ICD-10-CM | POA: Diagnosis not present

## 2020-11-04 DIAGNOSIS — I5033 Acute on chronic diastolic (congestive) heart failure: Secondary | ICD-10-CM | POA: Diagnosis not present

## 2020-11-04 DIAGNOSIS — N400 Enlarged prostate without lower urinary tract symptoms: Secondary | ICD-10-CM | POA: Diagnosis not present

## 2020-11-04 DIAGNOSIS — R5381 Other malaise: Secondary | ICD-10-CM | POA: Diagnosis not present

## 2020-11-04 DIAGNOSIS — I252 Old myocardial infarction: Secondary | ICD-10-CM | POA: Diagnosis not present

## 2020-11-04 DIAGNOSIS — C7951 Secondary malignant neoplasm of bone: Secondary | ICD-10-CM | POA: Diagnosis not present

## 2020-11-04 DIAGNOSIS — Z510 Encounter for antineoplastic radiation therapy: Secondary | ICD-10-CM | POA: Diagnosis not present

## 2020-11-04 DIAGNOSIS — I2510 Atherosclerotic heart disease of native coronary artery without angina pectoris: Secondary | ICD-10-CM | POA: Diagnosis not present

## 2020-11-04 DIAGNOSIS — C3411 Malignant neoplasm of upper lobe, right bronchus or lung: Secondary | ICD-10-CM | POA: Diagnosis not present

## 2020-11-04 DIAGNOSIS — N183 Chronic kidney disease, stage 3 unspecified: Secondary | ICD-10-CM | POA: Diagnosis not present

## 2020-11-04 DIAGNOSIS — E785 Hyperlipidemia, unspecified: Secondary | ICD-10-CM | POA: Diagnosis not present

## 2020-11-04 DIAGNOSIS — M159 Polyosteoarthritis, unspecified: Secondary | ICD-10-CM | POA: Diagnosis not present

## 2020-11-04 DIAGNOSIS — Z743 Need for continuous supervision: Secondary | ICD-10-CM | POA: Diagnosis not present

## 2020-11-04 NOTE — Telephone Encounter (Signed)
Called [facility] to check on the status of the receipt of tissue.  Said that the tissue was sent to another lab for testing and is on the way back to the [facility] dept.  [facility] follows up every 2 days and expect to get a call soon stating the tissue is on the way to their facility for testing.  They will call us if any further delays

## 2020-11-05 ENCOUNTER — Ambulatory Visit: Payer: Medicare Other

## 2020-11-05 ENCOUNTER — Telehealth: Payer: Self-pay | Admitting: Internal Medicine

## 2020-11-05 ENCOUNTER — Ambulatory Visit
Admission: RE | Admit: 2020-11-05 | Discharge: 2020-11-05 | Disposition: A | Discharge status: Home / Self Care | Payer: Medicare Other | Source: Ambulatory Visit | Attending: Radiation Oncology | Admitting: Radiation Oncology

## 2020-11-05 DIAGNOSIS — G893 Neoplasm related pain (acute) (chronic): Secondary | ICD-10-CM | POA: Diagnosis not present

## 2020-11-05 DIAGNOSIS — C3411 Malignant neoplasm of upper lobe, right bronchus or lung: Secondary | ICD-10-CM | POA: Diagnosis not present

## 2020-11-05 DIAGNOSIS — I2510 Atherosclerotic heart disease of native coronary artery without angina pectoris: Secondary | ICD-10-CM | POA: Diagnosis not present

## 2020-11-05 DIAGNOSIS — C7951 Secondary malignant neoplasm of bone: Secondary | ICD-10-CM | POA: Diagnosis not present

## 2020-11-05 DIAGNOSIS — R5381 Other malaise: Secondary | ICD-10-CM | POA: Diagnosis not present

## 2020-11-05 DIAGNOSIS — M8458XD Pathological fracture in neoplastic disease, other specified site, subsequent encounter for fracture with routine healing: Secondary | ICD-10-CM | POA: Diagnosis not present

## 2020-11-05 DIAGNOSIS — Z510 Encounter for antineoplastic radiation therapy: Secondary | ICD-10-CM | POA: Diagnosis not present

## 2020-11-05 DIAGNOSIS — Z7401 Bed confinement status: Secondary | ICD-10-CM | POA: Diagnosis not present

## 2020-11-05 DIAGNOSIS — M2550 Pain in unspecified joint: Secondary | ICD-10-CM | POA: Diagnosis not present

## 2020-11-05 DIAGNOSIS — I130 Hypertensive heart and chronic kidney disease with heart failure and stage 1 through stage 4 chronic kidney disease, or unspecified chronic kidney disease: Secondary | ICD-10-CM | POA: Diagnosis not present

## 2020-11-05 NOTE — Telephone Encounter (Signed)
   Jonathan Warner DOB: [birth year] MRN: [id number]   RIDER WAIVER AND RELEASE OF LIABILITY  For purposes of improving physical access to our facilities, [facility] is pleased to partner with third parties to provide [facility] patients or other authorized individuals the option of convenient, on-demand ground transportation services (the [place]") through use of the technology service that enables users to request on-demand ground transportation from independent third-party providers.  By opting to use and accept these [employer], I, the undersigned, hereby agree on behalf of myself, and on behalf of any minor child using the [employer] for whom I am the parent or legal guardian, as follows:  1. Government social research officer provided to me are provided by independent third-party transportation providers who are not [employer] or employees and who are unaffiliated with [employer]. 2. [facility] is neither a transportation carrier nor a common or public carrier. 3. [facility] has no control over the quality or safety of the transportation that occurs as a result of the [employer]. 4. [facility] cannot guarantee that any third-party transportation provider will complete any arranged transportation service. 5. [facility] makes no representation, warranty, or guarantee regarding the reliability, timeliness, quality, safety, suitability, or availability of any of the Transport Services or that they will be error free. 6. I fully understand that traveling by vehicle involves risks and dangers of serious bodily injury, including permanent disability, paralysis, and death. I agree, on behalf of myself and on behalf of any minor child using the Transport Services for whom I am the parent or legal guardian, that the entire risk arising out of my use of the [employer] remains solely with me, to the maximum extent permitted under applicable law. 7. The [employer]  are provided "as is" and "as available." [facility] disclaims all representations and warranties, express, implied or statutory, not expressly set out in these terms, including the implied warranties of merchantability and fitness for a particular purpose. 8. I hereby waive and release [facility], its agents, employees, officers, directors, representatives, insurers, attorneys, assigns, successors, subsidiaries, and affiliates from any and all past, present, or future claims, demands, liabilities, actions, causes of action, or suits of any kind directly or indirectly arising from acceptance and use of the [employer]. 9. I further waive and release [facility] and its affiliates from all present and future liability and responsibility for any injury or death to persons or damages to property caused by or related to the use of the [employer]. 10. I have read this Waiver and Release of Liability, and I understand the terms used in it and their legal significance. This Waiver is freely and voluntarily given with the understanding that my right (as well as the right of any minor child for whom I am the parent or legal guardian using the [employer]) to legal recourse against [facility] in connection with the [employer] is knowingly surrendered in return for use of these services.   I attest that I read the consent document to Jonathan Warner, gave Jonathan Warner the opportunity to ask questions and answered the questions asked (if any). I affirm that Jonathan Warner then provided consent for he's participation in this program.     Jonathan Warner

## 2020-11-06 ENCOUNTER — Ambulatory Visit
Admission: RE | Admit: 2020-11-06 | Discharge: 2020-11-06 | Disposition: A | Discharge status: Home / Self Care | Payer: Medicare Other | Source: Ambulatory Visit | Attending: Radiation Oncology | Admitting: Radiation Oncology

## 2020-11-06 ENCOUNTER — Ambulatory Visit: Payer: Medicare Other

## 2020-11-06 ENCOUNTER — Other Ambulatory Visit: Payer: Self-pay

## 2020-11-06 DIAGNOSIS — C3411 Malignant neoplasm of upper lobe, right bronchus or lung: Secondary | ICD-10-CM | POA: Diagnosis not present

## 2020-11-06 DIAGNOSIS — M8458XD Pathological fracture in neoplastic disease, other specified site, subsequent encounter for fracture with routine healing: Secondary | ICD-10-CM | POA: Diagnosis not present

## 2020-11-06 DIAGNOSIS — I2510 Atherosclerotic heart disease of native coronary artery without angina pectoris: Secondary | ICD-10-CM | POA: Diagnosis not present

## 2020-11-06 DIAGNOSIS — G893 Neoplasm related pain (acute) (chronic): Secondary | ICD-10-CM | POA: Diagnosis not present

## 2020-11-06 DIAGNOSIS — I130 Hypertensive heart and chronic kidney disease with heart failure and stage 1 through stage 4 chronic kidney disease, or unspecified chronic kidney disease: Secondary | ICD-10-CM | POA: Diagnosis not present

## 2020-11-06 DIAGNOSIS — Z510 Encounter for antineoplastic radiation therapy: Secondary | ICD-10-CM | POA: Diagnosis not present

## 2020-11-06 DIAGNOSIS — C7951 Secondary malignant neoplasm of bone: Secondary | ICD-10-CM | POA: Diagnosis not present

## 2020-11-06 MED ORDER — SONAFINE EX EMUL
1.0000 "application " | Freq: Two times a day (BID) | CUTANEOUS | Status: DC
  Administered 2020-11-06: 1 via TOPICAL

## 2020-11-06 MED ORDER — SONAFINE EX EMUL: 1.0000 "application " | Freq: Two times a day (BID) | CUTANEOUS | Status: DC

## 2020-11-06 NOTE — Progress Notes (Signed)
Pacemaker monitoring during  Radiation treatment. Patient tolerated well. No distress noted during or at the completion of treatment.Heartrate range from 89-110

## 2020-11-07 ENCOUNTER — Ambulatory Visit: Payer: Medicare Other

## 2020-11-07 ENCOUNTER — Ambulatory Visit
Admission: RE | Admit: 2020-11-07 | Discharge: 2020-11-07 | Disposition: A | Discharge status: Home / Self Care | Payer: Medicare Other | Source: Ambulatory Visit | Attending: Radiation Oncology | Admitting: Radiation Oncology

## 2020-11-07 DIAGNOSIS — I2510 Atherosclerotic heart disease of native coronary artery without angina pectoris: Secondary | ICD-10-CM | POA: Diagnosis not present

## 2020-11-07 DIAGNOSIS — C3411 Malignant neoplasm of upper lobe, right bronchus or lung: Secondary | ICD-10-CM | POA: Diagnosis not present

## 2020-11-07 DIAGNOSIS — I130 Hypertensive heart and chronic kidney disease with heart failure and stage 1 through stage 4 chronic kidney disease, or unspecified chronic kidney disease: Secondary | ICD-10-CM | POA: Diagnosis not present

## 2020-11-07 DIAGNOSIS — M8458XD Pathological fracture in neoplastic disease, other specified site, subsequent encounter for fracture with routine healing: Secondary | ICD-10-CM | POA: Diagnosis not present

## 2020-11-07 DIAGNOSIS — C7951 Secondary malignant neoplasm of bone: Secondary | ICD-10-CM | POA: Diagnosis not present

## 2020-11-07 DIAGNOSIS — Z510 Encounter for antineoplastic radiation therapy: Secondary | ICD-10-CM | POA: Diagnosis not present

## 2020-11-07 DIAGNOSIS — G893 Neoplasm related pain (acute) (chronic): Secondary | ICD-10-CM | POA: Diagnosis not present

## 2020-11-08 ENCOUNTER — Ambulatory Visit
Admission: RE | Admit: 2020-11-08 | Discharge: 2020-11-08 | Disposition: A | Discharge status: Home / Self Care | Payer: Medicare Other | Source: Ambulatory Visit | Attending: Radiation Oncology | Admitting: Radiation Oncology

## 2020-11-08 DIAGNOSIS — C7951 Secondary malignant neoplasm of bone: Secondary | ICD-10-CM | POA: Diagnosis not present

## 2020-11-08 DIAGNOSIS — Z510 Encounter for antineoplastic radiation therapy: Secondary | ICD-10-CM | POA: Diagnosis not present

## 2020-11-08 DIAGNOSIS — I130 Hypertensive heart and chronic kidney disease with heart failure and stage 1 through stage 4 chronic kidney disease, or unspecified chronic kidney disease: Secondary | ICD-10-CM | POA: Diagnosis not present

## 2020-11-08 DIAGNOSIS — I2510 Atherosclerotic heart disease of native coronary artery without angina pectoris: Secondary | ICD-10-CM | POA: Diagnosis not present

## 2020-11-08 DIAGNOSIS — G893 Neoplasm related pain (acute) (chronic): Secondary | ICD-10-CM | POA: Diagnosis not present

## 2020-11-08 DIAGNOSIS — C3411 Malignant neoplasm of upper lobe, right bronchus or lung: Secondary | ICD-10-CM | POA: Diagnosis not present

## 2020-11-08 DIAGNOSIS — M8458XD Pathological fracture in neoplastic disease, other specified site, subsequent encounter for fracture with routine healing: Secondary | ICD-10-CM | POA: Diagnosis not present

## 2020-11-11 ENCOUNTER — Ambulatory Visit: Payer: Medicare Other

## 2020-11-11 ENCOUNTER — Ambulatory Visit
Admission: RE | Admit: 2020-11-11 | Discharge: 2020-11-11 | Disposition: A | Discharge status: Home / Self Care | Payer: Medicare Other | Source: Ambulatory Visit | Attending: Radiation Oncology | Admitting: Radiation Oncology

## 2020-11-11 ENCOUNTER — Other Ambulatory Visit: Payer: Self-pay

## 2020-11-11 ENCOUNTER — Telehealth: Payer: Self-pay | Admitting: Radiology

## 2020-11-11 DIAGNOSIS — C3411 Malignant neoplasm of upper lobe, right bronchus or lung: Secondary | ICD-10-CM | POA: Diagnosis not present

## 2020-11-11 DIAGNOSIS — C7951 Secondary malignant neoplasm of bone: Secondary | ICD-10-CM | POA: Diagnosis not present

## 2020-11-11 DIAGNOSIS — Z510 Encounter for antineoplastic radiation therapy: Secondary | ICD-10-CM | POA: Diagnosis not present

## 2020-11-11 NOTE — Telephone Encounter (Signed)
Patient on cardiac monitor during radiation treatment. Heart rate between 72-96. Occasional PVCs. Patient tolerated well.

## 2020-11-12 ENCOUNTER — Encounter: Payer: Self-pay | Admitting: *Deleted

## 2020-11-12 ENCOUNTER — Ambulatory Visit: Payer: Medicare Other

## 2020-11-12 DIAGNOSIS — I130 Hypertensive heart and chronic kidney disease with heart failure and stage 1 through stage 4 chronic kidney disease, or unspecified chronic kidney disease: Secondary | ICD-10-CM | POA: Diagnosis not present

## 2020-11-12 DIAGNOSIS — C3411 Malignant neoplasm of upper lobe, right bronchus or lung: Secondary | ICD-10-CM | POA: Diagnosis not present

## 2020-11-12 DIAGNOSIS — G893 Neoplasm related pain (acute) (chronic): Secondary | ICD-10-CM | POA: Diagnosis not present

## 2020-11-12 DIAGNOSIS — M8458XD Pathological fracture in neoplastic disease, other specified site, subsequent encounter for fracture with routine healing: Secondary | ICD-10-CM | POA: Diagnosis not present

## 2020-11-12 DIAGNOSIS — C7951 Secondary malignant neoplasm of bone: Secondary | ICD-10-CM | POA: Diagnosis not present

## 2020-11-12 DIAGNOSIS — I2510 Atherosclerotic heart disease of native coronary artery without angina pectoris: Secondary | ICD-10-CM | POA: Diagnosis not present

## 2020-11-12 NOTE — Progress Notes (Signed)
Order placed for testing on 10/24/2020. [facility] still has not received the specimen. Called pathology and the specimen is still located at an outside lab, [facility], for PDL1 testing. They have requested it's return several times. Once they receive the specimen they will forward to [employer] for testing.  Dr Marin Olp notified of delay  Oncology Nurse Navigator Documentation  Oncology Nurse Navigator Flowsheets 11/12/2020  Navigator Location [facility]  Navigator Encounter Type Molecular Studies;Telephone  Telephone Outgoing Call  Patient Visit Type MedOnc  Treatment Phase Active Tx  Interventions Coordination of Care  Acuity Level 2-Minimal Needs (1-2 Barriers Identified)  Coordination of Care Other  Support Groups/Services Friends and Family  Time Spent with Patient 30

## 2020-11-13 ENCOUNTER — Ambulatory Visit: Payer: Medicare Other

## 2020-11-13 ENCOUNTER — Telehealth: Payer: Self-pay | Admitting: Orthopaedic Surgery

## 2020-11-13 DIAGNOSIS — C3411 Malignant neoplasm of upper lobe, right bronchus or lung: Secondary | ICD-10-CM | POA: Diagnosis not present

## 2020-11-13 DIAGNOSIS — I2510 Atherosclerotic heart disease of native coronary artery without angina pectoris: Secondary | ICD-10-CM | POA: Diagnosis not present

## 2020-11-13 DIAGNOSIS — C7951 Secondary malignant neoplasm of bone: Secondary | ICD-10-CM | POA: Diagnosis not present

## 2020-11-13 DIAGNOSIS — I130 Hypertensive heart and chronic kidney disease with heart failure and stage 1 through stage 4 chronic kidney disease, or unspecified chronic kidney disease: Secondary | ICD-10-CM | POA: Diagnosis not present

## 2020-11-13 DIAGNOSIS — M8458XD Pathological fracture in neoplastic disease, other specified site, subsequent encounter for fracture with routine healing: Secondary | ICD-10-CM | POA: Diagnosis not present

## 2020-11-13 DIAGNOSIS — G893 Neoplasm related pain (acute) (chronic): Secondary | ICD-10-CM | POA: Diagnosis not present

## 2020-11-13 NOTE — Telephone Encounter (Signed)
10/16/20 ov note faxed to [place]

## 2020-11-14 ENCOUNTER — Ambulatory Visit: Payer: Medicare Other

## 2020-11-14 DIAGNOSIS — M8458XD Pathological fracture in neoplastic disease, other specified site, subsequent encounter for fracture with routine healing: Secondary | ICD-10-CM | POA: Diagnosis not present

## 2020-11-14 DIAGNOSIS — C7951 Secondary malignant neoplasm of bone: Secondary | ICD-10-CM | POA: Diagnosis not present

## 2020-11-14 DIAGNOSIS — C3411 Malignant neoplasm of upper lobe, right bronchus or lung: Secondary | ICD-10-CM | POA: Diagnosis not present

## 2020-11-14 DIAGNOSIS — I130 Hypertensive heart and chronic kidney disease with heart failure and stage 1 through stage 4 chronic kidney disease, or unspecified chronic kidney disease: Secondary | ICD-10-CM | POA: Diagnosis not present

## 2020-11-14 DIAGNOSIS — G893 Neoplasm related pain (acute) (chronic): Secondary | ICD-10-CM | POA: Diagnosis not present

## 2020-11-14 DIAGNOSIS — I2510 Atherosclerotic heart disease of native coronary artery without angina pectoris: Secondary | ICD-10-CM | POA: Diagnosis not present

## 2020-11-15 ENCOUNTER — Telehealth: Payer: Self-pay

## 2020-11-15 ENCOUNTER — Telehealth: Payer: Self-pay | Admitting: *Deleted

## 2020-11-15 ENCOUNTER — Ambulatory Visit: Payer: Medicare Other

## 2020-11-15 DIAGNOSIS — C3411 Malignant neoplasm of upper lobe, right bronchus or lung: Secondary | ICD-10-CM | POA: Diagnosis not present

## 2020-11-15 DIAGNOSIS — G893 Neoplasm related pain (acute) (chronic): Secondary | ICD-10-CM | POA: Diagnosis not present

## 2020-11-15 DIAGNOSIS — I2510 Atherosclerotic heart disease of native coronary artery without angina pectoris: Secondary | ICD-10-CM | POA: Diagnosis not present

## 2020-11-15 DIAGNOSIS — M8458XD Pathological fracture in neoplastic disease, other specified site, subsequent encounter for fracture with routine healing: Secondary | ICD-10-CM | POA: Diagnosis not present

## 2020-11-15 DIAGNOSIS — I130 Hypertensive heart and chronic kidney disease with heart failure and stage 1 through stage 4 chronic kidney disease, or unspecified chronic kidney disease: Secondary | ICD-10-CM | POA: Diagnosis not present

## 2020-11-15 DIAGNOSIS — C7951 Secondary malignant neoplasm of bone: Secondary | ICD-10-CM | POA: Diagnosis not present

## 2020-11-15 NOTE — Telephone Encounter (Signed)
Call received from patient's wife requesting medication for pt for depression d/t pt continuously talks about death.  Dr. Marin Olp notified.  Informed Jonathan Warner that Dr. Marin Olp would like for pt to come in for labs and to see Dr Marin Olp.  Jonathan Warner states that she would like for her son Jonathan Warner to be at the MD appt and requests that we call him to schedule appt.  Message sent to scheduling.

## 2020-11-15 NOTE — Telephone Encounter (Signed)
11/15/20 @145PM : Palliative care SW outreached patients/spouse to discuss caregiver support and provide information on sitters and agencices.   Wife was very confused by phone called and stated that she has had so many people call lately. SW explained role and organization affiliation. Wife stated that she has assistance now with patient care through [facility], a CNA comes to provide ADL care and assistance as awell as PT.   Wife stated that she has not spoken to patients doctors recently but she really wants to pursue and move forward with Hospice services for patient. Wife shared that patient is not eating and has an extensive wound on his bottom. RN Through [facility] mentioned Hospice services to wife. Wife shared that she wants patient to be comfortable and cared for. Wife shared that she has spoke to a Hospice admitting nurse recently and did not opt in to hospice at that time because patient was receiving PT, however, wife and patient feels PT is not beneficial to patient at this time.  Wife declined resources from SW, per referral request, and statated that she would like to move forward with Hospice for patient.   SW made PC team and PC NP aware of patient/spouse hospice request.

## 2020-11-18 ENCOUNTER — Telehealth: Payer: Self-pay | Admitting: *Deleted

## 2020-11-18 DIAGNOSIS — K219 Gastro-esophageal reflux disease without esophagitis: Secondary | ICD-10-CM | POA: Diagnosis not present

## 2020-11-18 DIAGNOSIS — Z950 Presence of cardiac pacemaker: Secondary | ICD-10-CM | POA: Diagnosis not present

## 2020-11-18 DIAGNOSIS — I2510 Atherosclerotic heart disease of native coronary artery without angina pectoris: Secondary | ICD-10-CM | POA: Diagnosis not present

## 2020-11-18 DIAGNOSIS — I495 Sick sinus syndrome: Secondary | ICD-10-CM | POA: Diagnosis not present

## 2020-11-18 DIAGNOSIS — L89152 Pressure ulcer of sacral region, stage 2: Secondary | ICD-10-CM | POA: Diagnosis not present

## 2020-11-18 DIAGNOSIS — I110 Hypertensive heart disease with heart failure: Secondary | ICD-10-CM | POA: Diagnosis not present

## 2020-11-18 DIAGNOSIS — I5030 Unspecified diastolic (congestive) heart failure: Secondary | ICD-10-CM | POA: Diagnosis not present

## 2020-11-18 DIAGNOSIS — C3411 Malignant neoplasm of upper lobe, right bronchus or lung: Secondary | ICD-10-CM | POA: Diagnosis not present

## 2020-11-18 DIAGNOSIS — M8458XD Pathological fracture in neoplastic disease, other specified site, subsequent encounter for fracture with routine healing: Secondary | ICD-10-CM | POA: Diagnosis not present

## 2020-11-18 DIAGNOSIS — R634 Abnormal weight loss: Secondary | ICD-10-CM | POA: Diagnosis not present

## 2020-11-18 DIAGNOSIS — C61 Malignant neoplasm of prostate: Secondary | ICD-10-CM | POA: Diagnosis not present

## 2020-11-18 DIAGNOSIS — G893 Neoplasm related pain (acute) (chronic): Secondary | ICD-10-CM | POA: Diagnosis not present

## 2020-11-18 DIAGNOSIS — I130 Hypertensive heart and chronic kidney disease with heart failure and stage 1 through stage 4 chronic kidney disease, or unspecified chronic kidney disease: Secondary | ICD-10-CM | POA: Diagnosis not present

## 2020-11-18 DIAGNOSIS — I25709 Atherosclerosis of coronary artery bypass graft(s), unspecified, with unspecified angina pectoris: Secondary | ICD-10-CM | POA: Diagnosis not present

## 2020-11-18 DIAGNOSIS — C7951 Secondary malignant neoplasm of bone: Secondary | ICD-10-CM | POA: Diagnosis not present

## 2020-11-18 DIAGNOSIS — Z87891 Personal history of nicotine dependence: Secondary | ICD-10-CM | POA: Diagnosis not present

## 2020-11-18 NOTE — Telephone Encounter (Signed)
Per scheduling message 11/15/20 - lvm of upcoming appointment to son Jonathan Warner

## 2020-11-19 ENCOUNTER — Telehealth: Payer: Self-pay | Admitting: *Deleted

## 2020-11-19 DIAGNOSIS — I110 Hypertensive heart disease with heart failure: Secondary | ICD-10-CM | POA: Diagnosis not present

## 2020-11-19 DIAGNOSIS — I25709 Atherosclerosis of coronary artery bypass graft(s), unspecified, with unspecified angina pectoris: Secondary | ICD-10-CM | POA: Diagnosis not present

## 2020-11-19 DIAGNOSIS — I5030 Unspecified diastolic (congestive) heart failure: Secondary | ICD-10-CM | POA: Diagnosis not present

## 2020-11-19 DIAGNOSIS — C7951 Secondary malignant neoplasm of bone: Secondary | ICD-10-CM | POA: Diagnosis not present

## 2020-11-19 DIAGNOSIS — R634 Abnormal weight loss: Secondary | ICD-10-CM | POA: Diagnosis not present

## 2020-11-19 DIAGNOSIS — C3411 Malignant neoplasm of upper lobe, right bronchus or lung: Secondary | ICD-10-CM | POA: Diagnosis not present

## 2020-11-19 NOTE — Telephone Encounter (Signed)
Call received from patient's son Jonathan Warner stating that pt is now under hospice care with [facility] and would like to cancel pt.'s upcoming appts on 11/22/20.  Appts canceled and Dr. Marin Olp notified.

## 2020-11-21 DIAGNOSIS — I5030 Unspecified diastolic (congestive) heart failure: Secondary | ICD-10-CM | POA: Diagnosis not present

## 2020-11-21 DIAGNOSIS — C7951 Secondary malignant neoplasm of bone: Secondary | ICD-10-CM | POA: Diagnosis not present

## 2020-11-21 DIAGNOSIS — I25709 Atherosclerosis of coronary artery bypass graft(s), unspecified, with unspecified angina pectoris: Secondary | ICD-10-CM | POA: Diagnosis not present

## 2020-11-21 DIAGNOSIS — I110 Hypertensive heart disease with heart failure: Secondary | ICD-10-CM | POA: Diagnosis not present

## 2020-11-21 DIAGNOSIS — R634 Abnormal weight loss: Secondary | ICD-10-CM | POA: Diagnosis not present

## 2020-11-21 DIAGNOSIS — C3411 Malignant neoplasm of upper lobe, right bronchus or lung: Secondary | ICD-10-CM | POA: Diagnosis not present

## 2020-11-22 ENCOUNTER — Other Ambulatory Visit: Payer: Medicare Other

## 2020-11-22 ENCOUNTER — Ambulatory Visit: Payer: Medicare Other | Admitting: Hematology & Oncology

## 2020-11-22 DIAGNOSIS — I5030 Unspecified diastolic (congestive) heart failure: Secondary | ICD-10-CM | POA: Diagnosis not present

## 2020-11-22 DIAGNOSIS — C61 Malignant neoplasm of prostate: Secondary | ICD-10-CM | POA: Diagnosis not present

## 2020-11-22 DIAGNOSIS — R634 Abnormal weight loss: Secondary | ICD-10-CM | POA: Diagnosis not present

## 2020-11-22 DIAGNOSIS — Z950 Presence of cardiac pacemaker: Secondary | ICD-10-CM | POA: Diagnosis not present

## 2020-11-22 DIAGNOSIS — I25709 Atherosclerosis of coronary artery bypass graft(s), unspecified, with unspecified angina pectoris: Secondary | ICD-10-CM | POA: Diagnosis not present

## 2020-11-22 DIAGNOSIS — I495 Sick sinus syndrome: Secondary | ICD-10-CM | POA: Diagnosis not present

## 2020-11-22 DIAGNOSIS — C3411 Malignant neoplasm of upper lobe, right bronchus or lung: Secondary | ICD-10-CM | POA: Diagnosis not present

## 2020-11-22 DIAGNOSIS — Z87891 Personal history of nicotine dependence: Secondary | ICD-10-CM | POA: Diagnosis not present

## 2020-11-22 DIAGNOSIS — C7951 Secondary malignant neoplasm of bone: Secondary | ICD-10-CM | POA: Diagnosis not present

## 2020-11-22 DIAGNOSIS — L89152 Pressure ulcer of sacral region, stage 2: Secondary | ICD-10-CM | POA: Diagnosis not present

## 2020-11-22 DIAGNOSIS — I110 Hypertensive heart disease with heart failure: Secondary | ICD-10-CM | POA: Diagnosis not present

## 2020-11-22 DIAGNOSIS — K219 Gastro-esophageal reflux disease without esophagitis: Secondary | ICD-10-CM | POA: Diagnosis not present

## 2020-11-25 DIAGNOSIS — C7951 Secondary malignant neoplasm of bone: Secondary | ICD-10-CM | POA: Diagnosis not present

## 2020-11-25 DIAGNOSIS — I110 Hypertensive heart disease with heart failure: Secondary | ICD-10-CM | POA: Diagnosis not present

## 2020-11-25 DIAGNOSIS — I25709 Atherosclerosis of coronary artery bypass graft(s), unspecified, with unspecified angina pectoris: Secondary | ICD-10-CM | POA: Diagnosis not present

## 2020-11-25 DIAGNOSIS — C3411 Malignant neoplasm of upper lobe, right bronchus or lung: Secondary | ICD-10-CM | POA: Diagnosis not present

## 2020-11-25 DIAGNOSIS — R634 Abnormal weight loss: Secondary | ICD-10-CM | POA: Diagnosis not present

## 2020-11-25 DIAGNOSIS — I5030 Unspecified diastolic (congestive) heart failure: Secondary | ICD-10-CM | POA: Diagnosis not present

## 2020-11-26 DIAGNOSIS — I25709 Atherosclerosis of coronary artery bypass graft(s), unspecified, with unspecified angina pectoris: Secondary | ICD-10-CM | POA: Diagnosis not present

## 2020-11-26 DIAGNOSIS — C7951 Secondary malignant neoplasm of bone: Secondary | ICD-10-CM | POA: Diagnosis not present

## 2020-11-26 DIAGNOSIS — I5030 Unspecified diastolic (congestive) heart failure: Secondary | ICD-10-CM | POA: Diagnosis not present

## 2020-11-26 DIAGNOSIS — R634 Abnormal weight loss: Secondary | ICD-10-CM | POA: Diagnosis not present

## 2020-11-26 DIAGNOSIS — C3411 Malignant neoplasm of upper lobe, right bronchus or lung: Secondary | ICD-10-CM | POA: Diagnosis not present

## 2020-11-26 DIAGNOSIS — I110 Hypertensive heart disease with heart failure: Secondary | ICD-10-CM | POA: Diagnosis not present

## 2020-11-27 DIAGNOSIS — R634 Abnormal weight loss: Secondary | ICD-10-CM | POA: Diagnosis not present

## 2020-11-27 DIAGNOSIS — I25709 Atherosclerosis of coronary artery bypass graft(s), unspecified, with unspecified angina pectoris: Secondary | ICD-10-CM | POA: Diagnosis not present

## 2020-11-27 DIAGNOSIS — C3411 Malignant neoplasm of upper lobe, right bronchus or lung: Secondary | ICD-10-CM | POA: Diagnosis not present

## 2020-11-27 DIAGNOSIS — I5030 Unspecified diastolic (congestive) heart failure: Secondary | ICD-10-CM | POA: Diagnosis not present

## 2020-11-27 DIAGNOSIS — I110 Hypertensive heart disease with heart failure: Secondary | ICD-10-CM | POA: Diagnosis not present

## 2020-11-27 DIAGNOSIS — C7951 Secondary malignant neoplasm of bone: Secondary | ICD-10-CM | POA: Diagnosis not present

## 2020-11-28 DIAGNOSIS — C7951 Secondary malignant neoplasm of bone: Secondary | ICD-10-CM | POA: Diagnosis not present

## 2020-11-28 DIAGNOSIS — I25709 Atherosclerosis of coronary artery bypass graft(s), unspecified, with unspecified angina pectoris: Secondary | ICD-10-CM | POA: Diagnosis not present

## 2020-11-28 DIAGNOSIS — I110 Hypertensive heart disease with heart failure: Secondary | ICD-10-CM | POA: Diagnosis not present

## 2020-11-28 DIAGNOSIS — R634 Abnormal weight loss: Secondary | ICD-10-CM | POA: Diagnosis not present

## 2020-11-28 DIAGNOSIS — C3411 Malignant neoplasm of upper lobe, right bronchus or lung: Secondary | ICD-10-CM | POA: Diagnosis not present

## 2020-11-28 DIAGNOSIS — I5030 Unspecified diastolic (congestive) heart failure: Secondary | ICD-10-CM | POA: Diagnosis not present

## 2020-12-03 ENCOUNTER — Encounter (HOSPITAL_COMMUNITY): Payer: Self-pay

## 2020-12-12 DIAGNOSIS — H409 Unspecified glaucoma: Secondary | ICD-10-CM | POA: Diagnosis not present

## 2020-12-12 DIAGNOSIS — N400 Enlarged prostate without lower urinary tract symptoms: Secondary | ICD-10-CM | POA: Diagnosis not present

## 2020-12-12 DIAGNOSIS — E785 Hyperlipidemia, unspecified: Secondary | ICD-10-CM | POA: Diagnosis not present

## 2020-12-12 DIAGNOSIS — M179 Osteoarthritis of knee, unspecified: Secondary | ICD-10-CM | POA: Diagnosis not present

## 2020-12-12 DIAGNOSIS — I2510 Atherosclerotic heart disease of native coronary artery without angina pectoris: Secondary | ICD-10-CM | POA: Diagnosis not present

## 2020-12-12 DIAGNOSIS — I480 Paroxysmal atrial fibrillation: Secondary | ICD-10-CM | POA: Diagnosis not present

## 2020-12-12 DIAGNOSIS — I252 Old myocardial infarction: Secondary | ICD-10-CM | POA: Diagnosis not present

## 2020-12-12 DIAGNOSIS — N183 Chronic kidney disease, stage 3 unspecified: Secondary | ICD-10-CM | POA: Diagnosis not present

## 2020-12-12 DIAGNOSIS — M159 Polyosteoarthritis, unspecified: Secondary | ICD-10-CM | POA: Diagnosis not present

## 2020-12-12 DIAGNOSIS — C61 Malignant neoplasm of prostate: Secondary | ICD-10-CM | POA: Diagnosis not present

## 2020-12-12 DIAGNOSIS — I10 Essential (primary) hypertension: Secondary | ICD-10-CM | POA: Diagnosis not present

## 2020-12-12 DIAGNOSIS — I5030 Unspecified diastolic (congestive) heart failure: Secondary | ICD-10-CM | POA: Diagnosis not present

## 2020-12-19 ENCOUNTER — Ambulatory Visit: Payer: Self-pay | Admitting: Radiation Oncology

## 2020-12-22 DEATH — deceased

## 2021-01-27 ENCOUNTER — Telehealth: Payer: Self-pay

## 2021-01-27 NOTE — Telephone Encounter (Signed)
I got an email stating the patient is deceased. I cancelled his remotes and marked him deceased in paceart. I also took him out of [facility].

## 2021-02-19 ENCOUNTER — Ambulatory Visit: Payer: Medicare Other | Admitting: Interventional Cardiology

## 2022-11-13 IMAGING — DX DG CHEST 1V PORT
1 series · 1 of 1 positions shown · non-contrast
Comparison: CT chest 10/20/2020

CLINICAL DATA: Pulmonary masses, status post bronchoscopy with
biopsy

EXAM:
PORTABLE CHEST 1 VIEW

[chest]
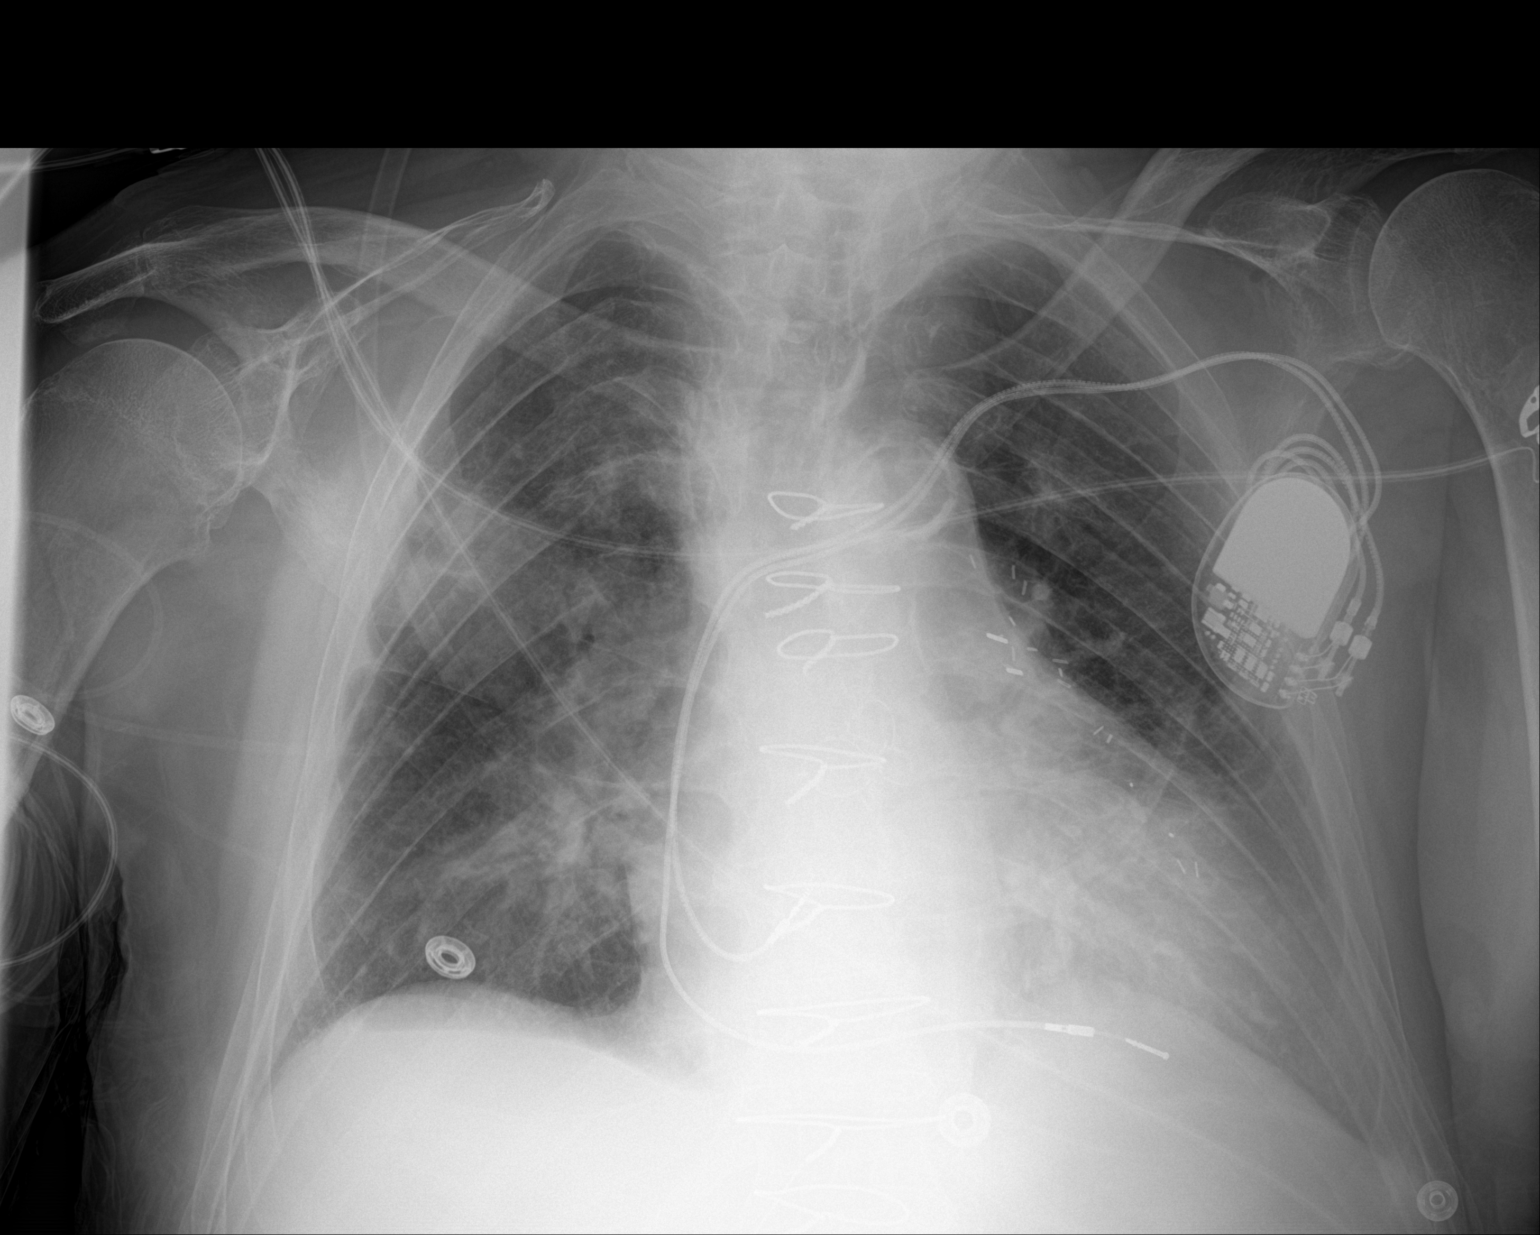

[1 of 1 positions shown; findings below may reference images not displayed]

FINDINGS: Dual lead pacer in place. Prior CABG. Atherosclerotic calcification
of the aortic arch.

No pneumothorax. Stable appearance of mass in the right upper lobe.
Indistinct lingular density compatible with previously seen pleural
thickening and scarring. Stable right perihilar fullness likely from
adenopathy. No blunting of the right costophrenic angle.

Stable mild cardiomegaly.
IMPRESSION: 1. No pneumothorax status post bronchoscopy.
2. Stable appearance of the right upper lobe mass and right
perihilar adenopathy.
3. Stable mild cardiomegaly.
4. Lingular pleural thickening and scarring.

## 2022-11-25 ENCOUNTER — Other Ambulatory Visit (HOSPITAL_COMMUNITY): Payer: Self-pay
